# Patient Record
Sex: Female | Born: 1965 | Race: White | Hispanic: No | Marital: Married | Smoking: Current every day smoker
Health system: Southern US, Community
[De-identification: ages and names within clinical notes are randomized; demographics above are authoritative.]

## PROBLEM LIST (undated history)

## (undated) DIAGNOSIS — Z9221 Personal history of antineoplastic chemotherapy: Secondary | ICD-10-CM

## (undated) DIAGNOSIS — Z1501 Genetic susceptibility to malignant neoplasm of breast: Secondary | ICD-10-CM

## (undated) DIAGNOSIS — G709 Myoneural disorder, unspecified: Secondary | ICD-10-CM

## (undated) DIAGNOSIS — F329 Major depressive disorder, single episode, unspecified: Secondary | ICD-10-CM

## (undated) DIAGNOSIS — Z860100 Personal history of colon polyps, unspecified: Secondary | ICD-10-CM

## (undated) DIAGNOSIS — J189 Pneumonia, unspecified organism: Secondary | ICD-10-CM

## (undated) DIAGNOSIS — C801 Malignant (primary) neoplasm, unspecified: Secondary | ICD-10-CM

## (undated) DIAGNOSIS — Z923 Personal history of irradiation: Secondary | ICD-10-CM

## (undated) DIAGNOSIS — Z87442 Personal history of urinary calculi: Secondary | ICD-10-CM

## (undated) DIAGNOSIS — Z800 Family history of malignant neoplasm of digestive organs: Secondary | ICD-10-CM

## (undated) DIAGNOSIS — Z9049 Acquired absence of other specified parts of digestive tract: Secondary | ICD-10-CM

## (undated) DIAGNOSIS — F32A Depression, unspecified: Secondary | ICD-10-CM

## (undated) HISTORY — PX: COLONOSCOPY: SHX174

## (undated) HISTORY — DX: Acquired absence of other specified parts of digestive tract: Z90.49

## (undated) HISTORY — DX: Genetic susceptibility to malignant neoplasm of breast: Z15.01

## (undated) HISTORY — PX: POLYPECTOMY: SHX149

## (undated) HISTORY — PX: BREAST SURGERY: SHX581

## (undated) HISTORY — DX: Family history of malignant neoplasm of digestive organs: Z80.0

## (undated) HISTORY — DX: Depression, unspecified: F32.A

## (undated) HISTORY — DX: Personal history of colonic polyps: Z86.010

## (undated) HISTORY — PX: COLON SURGERY: SHX602

## (undated) HISTORY — DX: Myoneural disorder, unspecified: G70.9

## (undated) HISTORY — DX: Personal history of antineoplastic chemotherapy: Z92.21

## (undated) HISTORY — DX: Personal history of irradiation: Z92.3

## (undated) HISTORY — DX: Personal history of colon polyps, unspecified: Z86.0100

---
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
## 2015-02-04 DIAGNOSIS — Z9049 Acquired absence of other specified parts of digestive tract: Secondary | ICD-10-CM

## 2015-02-04 HISTORY — PX: COLON SURGERY: SHX602

## 2015-02-04 HISTORY — DX: Acquired absence of other specified parts of digestive tract: Z90.49

## 2018-04-26 ENCOUNTER — Telehealth: Payer: Self-pay | Admitting: Gastroenterology

## 2018-04-26 NOTE — Telephone Encounter (Signed)
April 09, 2018 DoD Dr. Havery Moros, pt had previous colonoscopy at [facility] in 2017.  Records will be placed on your desk for review.

## 2018-04-27 NOTE — Telephone Encounter (Signed)
I have accepted the patient for direct surveillance colonoscopy when we resume routine surveillance colonoscopy in the next few months. She has a history of multiple polyps removed in 2017 in [place].

## 2018-07-16 ENCOUNTER — Other Ambulatory Visit: Payer: Self-pay

## 2018-07-16 ENCOUNTER — Ambulatory Visit: Payer: BC Managed Care – PPO | Admitting: *Deleted

## 2018-07-16 VITALS — Ht 68.0 in | Wt 170.0 lb

## 2018-07-16 DIAGNOSIS — Z86010 Personal history of colonic polyps: Secondary | ICD-10-CM

## 2018-07-16 MED ORDER — NA SULFATE-K SULFATE-MG SULF 17.5-3.13-1.6 GM/177ML PO SOLN: 1.0000 | Freq: Once | ORAL | 0 refills | Status: AC

## 2018-07-16 NOTE — Progress Notes (Signed)

## 2018-07-21 ENCOUNTER — Encounter: Payer: Self-pay | Admitting: Gastroenterology

## 2018-07-29 ENCOUNTER — Telehealth: Payer: Self-pay | Admitting: Gastroenterology

## 2018-07-29 NOTE — Telephone Encounter (Signed)

## 2018-07-30 ENCOUNTER — Other Ambulatory Visit: Payer: Self-pay

## 2018-07-30 ENCOUNTER — Encounter: Payer: Self-pay | Admitting: Gastroenterology

## 2018-07-30 ENCOUNTER — Ambulatory Visit (AMBULATORY_SURGERY_CENTER): Payer: BC Managed Care – PPO | Admitting: Gastroenterology

## 2018-07-30 VITALS — BP 134/84 | HR 67 | Temp 98.8°F | Resp 15 | Ht 68.0 in | Wt 170.0 lb

## 2018-07-30 DIAGNOSIS — Z1211 Encounter for screening for malignant neoplasm of colon: Secondary | ICD-10-CM | POA: Diagnosis not present

## 2018-07-30 DIAGNOSIS — D123 Benign neoplasm of transverse colon: Secondary | ICD-10-CM

## 2018-07-30 DIAGNOSIS — D122 Benign neoplasm of ascending colon: Secondary | ICD-10-CM

## 2018-07-30 DIAGNOSIS — D125 Benign neoplasm of sigmoid colon: Secondary | ICD-10-CM

## 2018-07-30 DIAGNOSIS — Z860100 Personal history of colon polyps, unspecified: Secondary | ICD-10-CM

## 2018-07-30 MED ORDER — SODIUM CHLORIDE 0.9 % IV SOLN: 500.0000 mL | Freq: Once | INTRAVENOUS | Status: DC

## 2018-07-30 NOTE — Progress Notes (Signed)
Pt's states no medical or surgical changes since previsit or office visit. 

## 2018-07-30 NOTE — Op Note (Signed)
[facility] Patient Name: Abigail Berg Procedure Date: 07/30/2018 7:30 AM MRN: [id number] Endoscopist: Remo Lipps P. Havery Moros , MD Age: 53 Referring MD:  Date of Birth: 22-Jul-1965 Gender: Female Account #: [id number] Procedure:                Colonoscopy Indications:              High risk colon cancer surveillance: Personal                            history of colonic polyps - numerous polyps removed                            in the past including R sided surgical resection 3                            years ago, 3 second degree family members with                            colon cancer Medicines:                Monitored Anesthesia Care Procedure:                Pre-Anesthesia Assessment:                           - Prior to the procedure, a History and Physical                            was performed, and patient medications and                            allergies were reviewed. The patient's tolerance of                            previous anesthesia was also reviewed. The risks                            and benefits of the procedure and the sedation                            options and risks were discussed with the patient.                            All questions were answered, and informed consent                            was obtained. Prior Anticoagulants: The patient has                            taken no previous anticoagulant or antiplatelet                            agents. ASA Grade Assessment: II - A patient with  mild systemic disease. After reviewing the risks                            and benefits, the patient was deemed in                            satisfactory condition to undergo the procedure.                           After obtaining informed consent, the colonoscope                            was passed under direct vision. Throughout the                            procedure, the patient's blood pressure, pulse, and                             oxygen saturations were monitored continuously. The                            Colonoscope was introduced through the anus and                            advanced to the the ileocolonic anastomosis. The                            colonoscopy was performed without difficulty. The                            patient tolerated the procedure well. The quality                            of the bowel preparation was adequate. The rectum,                            surgical anastomosis were photographed. Scope In: 7:34:14 AM Scope Out: 8:03:52 AM Scope Withdrawal Time: 0 hours 21 minutes 6 seconds  Total Procedure Duration: 0 hours 29 minutes 38 seconds  Findings:                 The perianal and digital rectal examinations were                            normal.                           There was evidence of a prior end-to-side                            ileo-colonic anastomosis in the ascending colon.                            This was patent and was characterized by healthy  appearing mucosa.                           Two sessile polyps were found in the ascending                            colon. The polyps were 3 to 4 mm in size. These                            polyps were removed with a cold snare. Resection                            and retrieval were complete.                           Six sessile polyps were found in the transverse                            colon. The polyps were 3 to 5 mm in size. These                            polyps were removed with a cold snare. Resection                            and retrieval were complete.                           Two sessile polyps were found in the sigmoid colon.                            The polyps were 3 to 4 mm in size. These polyps                            were removed with a cold snare. Resection and                            retrieval were complete.                            Scattered medium-mouthed diverticula were found in                            the entire colon.                           The colon was extremely tortuous.                           Internal hemorrhoids were found during retroflexion.                           There were numerous benign appearing hyperplastic  polyps in the rectosigmoid and sigmoid colon. The                            exam was otherwise without abnormality. Complications:            No immediate complications. Estimated blood loss:                            Minimal. Estimated Blood Loss:     Estimated blood loss was minimal. Impression:               - Patent end-to-side ileo-colonic anastomosis,                            characterized by healthy appearing mucosa.                           - Two 3 to 4 mm polyps in the ascending colon,                            removed with a cold snare. Resected and retrieved.                           - Six 3 to 5 mm polyps in the transverse colon,                            removed with a cold snare. Resected and retrieved.                           - Two 3 to 4 mm polyps in the sigmoid colon,                            removed with a cold snare. Resected and retrieved.                           - Diverticulosis in the entire examined colon.                           - Left sided hyperplastic polyps                           - Tortuous colon.                           - Internal hemorrhoids.                           - The examination was otherwise normal. Recommendation:           - Patient has a contact number available for                            emergencies. The signs and symptoms of potential  delayed complications were discussed with the                            patient. Return to normal activities tomorrow.                            Written discharge instructions were provided to the                             patient.                           - Resume previous diet.                           - Continue present medications.                           - Await pathology results.                           - Consideration for genetic testing, will discuss                            with patient given burden of polyps and family                            history of colon cancer Steven P. Armbruster, MD 07/30/2018 8:12:00 AM This report has been signed electronically.

## 2018-07-30 NOTE — Progress Notes (Signed)
A and O x3. Report to RN. Tolerated MAC anesthesia well.

## 2018-07-30 NOTE — Patient Instructions (Signed)
Information on polyps and diverticulosis given to you today.  Await pathology results.  Consider genetic testing.  YOU HAD AN ENDOSCOPIC PROCEDURE TODAY AT [facility]:   Refer to the procedure report that was given to you for any specific questions about what was found during the examination.  If the procedure report does not answer your questions, please call your gastroenterologist to clarify.  If you requested that your care partner not be given the details of your procedure findings, then the procedure report has been included in a sealed envelope for you to review at your convenience later.  YOU SHOULD EXPECT: Some feelings of bloating in the abdomen. Passage of more gas than usual.  Walking can help get rid of the air that was put into your GI tract during the procedure and reduce the bloating. If you had a lower endoscopy (such as a colonoscopy or flexible sigmoidoscopy) you may notice spotting of blood in your stool or on the toilet paper. If you underwent a bowel prep for your procedure, you may not have a normal bowel movement for a few days.  Please Note:  You might notice some irritation and congestion in your nose or some drainage.  This is from the oxygen used during your procedure.  There is no need for concern and it should clear up in a day or so.  SYMPTOMS TO REPORT IMMEDIATELY:   Following lower endoscopy (colonoscopy or flexible sigmoidoscopy):  Excessive amounts of blood in the stool  Significant tenderness or worsening of abdominal pains  Swelling of the abdomen that is new, acute  Fever of 100F or higher   For urgent or emergent issues, a gastroenterologist can be reached at any hour by calling [phone].   DIET:  We do recommend a small meal at first, but then you may proceed to your regular diet.  Drink plenty of fluids but you should avoid alcoholic beverages for 24 hours.  ACTIVITY:  You should plan to take it easy for the rest of today  and you should NOT DRIVE or use heavy machinery until tomorrow (because of the sedation medicines used during the test).    FOLLOW UP: Our staff will call the number listed on your records 48-72 hours following your procedure to check on you and address any questions or concerns that you may have regarding the information given to you following your procedure. If we do not reach you, we will leave a message.  We will attempt to reach you two times.  During this call, we will ask if you have developed any symptoms of COVID 19. If you develop any symptoms (ie: fever, flu-like symptoms, shortness of breath, cough etc.) before then, please call [phone].  If you test positive for Covid 19 in the 2 weeks post procedure, please call and report this information to [place].    If any biopsies were taken you will be contacted by phone or by letter within the next 1-3 weeks.  Please call us at [phone] if you have not heard about the biopsies in 3 weeks.    SIGNATURES/CONFIDENTIALITY: You and/or your care partner have signed paperwork which will be entered into your electronic medical record.  These signatures attest to the fact that that the information above on your After Visit Summary has been reviewed and is understood.  Full responsibility of the confidentiality of this discharge information lies with you and/or your care-partner.

## 2018-08-03 ENCOUNTER — Telehealth: Payer: Self-pay

## 2018-08-03 NOTE — Telephone Encounter (Signed)
  Follow up Call-  Call back number 07/30/2018 07/30/2018  Post procedure Call Back phone  # [phone] -  Permission to leave phone message Yes Yes     Patient questions:  Do you have a fever, pain , or abdominal swelling? No. Pain Score  0 *  Have you tolerated food without any problems? Yes.    Have you been able to return to your normal activities? Yes.    Do you have any questions about your discharge instructions: Diet   No. Medications  No. Follow up visit  No.  Do you have questions or concerns about your Care? No.  Actions: * If pain score is 4 or above: No action needed, pain <4.  1. Have you developed a fever since your procedure? no  2.   Have you had an respiratory symptoms (SOB or cough) since your procedure? no  3.   Have you tested positive for COVID 19 since your procedure no  4.   Have you had any family members/close contacts diagnosed with the COVID 19 since your procedure?  no   If yes to any of these questions please route to Joylene John, RN and Alphonsa Gin, Therapist, sports.

## 2018-08-05 ENCOUNTER — Telehealth: Payer: Self-pay | Admitting: Licensed Clinical Social Worker

## 2018-08-05 ENCOUNTER — Other Ambulatory Visit: Payer: Self-pay

## 2018-08-05 ENCOUNTER — Encounter: Payer: Self-pay | Admitting: Licensed Clinical Social Worker

## 2018-08-05 DIAGNOSIS — Z86010 Personal history of colonic polyps: Secondary | ICD-10-CM

## 2018-08-05 NOTE — Telephone Encounter (Signed)
Pt has been scheduled for genetic counseling on 7/23 at 11am. Letter mailed.

## 2018-08-25 ENCOUNTER — Telehealth: Payer: Self-pay | Admitting: Licensed Clinical Social Worker

## 2018-08-25 NOTE — Telephone Encounter (Signed)
Called patient regarding upcoming Webex appointment, screen test complete and e-mail has been sent.

## 2018-08-26 ENCOUNTER — Encounter: Payer: Self-pay | Admitting: Licensed Clinical Social Worker

## 2018-08-26 ENCOUNTER — Inpatient Hospital Stay: Payer: BC Managed Care – PPO | Attending: Genetic Counselor | Admitting: Licensed Clinical Social Worker

## 2018-08-26 DIAGNOSIS — Z800 Family history of malignant neoplasm of digestive organs: Secondary | ICD-10-CM | POA: Insufficient documentation

## 2018-08-26 DIAGNOSIS — Z860100 Personal history of colon polyps, unspecified: Secondary | ICD-10-CM | POA: Insufficient documentation

## 2018-08-26 NOTE — Progress Notes (Signed)
REFERRING PROVIDER: Yetta Flock, MD [address] [place],  [place] [address]  PRIMARY PROVIDER:  Patient, [name]  PRIMARY REASON FOR VISIT:  1. Family history of colon cancer   2. Personal history of colonic polyps     I connected with Abigail Berg on 08/26/2018 at 11:00 AM EDT by Webex and verified that I am speaking with the correct person using two identifiers.    Patient location: home Provider location: clinic  HISTORY OF PRESENT ILLNESS:   Abigail Berg, a 53 y.o. female, was seen for a [facility] cancer genetics consultation at the request of Dr. [name] due to a personal history of colon polyps and family history of colon cancer.  Abigail Berg presents to clinic today to discuss the possibility of a hereditary predisposition to cancer, genetic testing, and to further clarify her future cancer risks, as well as potential cancer risks for family members.    Abigail Berg is a 53 y.o. female with no personal history of cancer.  She has had 2 colonoscopies. At age 46, she reports having a colonoscopy that revealed 11 polyps and most were pre-cancerous. At age 53, she had a colonoscopy that revealed 10 tubular adenomas, for an approximate cumulative 20 tubular adenomas.   CANCER HISTORY:  Oncology History   No history exists.     RISK FACTORS:  Menarche was at age 15.  First live birth at age 52.  Ovaries intact: yes.  Hysterectomy: no.  Menopausal status: premenopausal.   Colonoscopy: yes; polyp history described above. Mammogram within the last year: yes.  Past Medical History:  Diagnosis Date  . Depression   . Family history of colon cancer   . Personal history of colonic polyps     Past Surgical History:  Procedure Laterality Date  . BREAST SURGERY    . COLON SURGERY     2017  . COLONOSCOPY    . POLYPECTOMY      Social History   Socioeconomic History  . Marital status: Married    Spouse name: Not on file  . Number of children: Not on file  . Years  of education: Not on file  . Highest education level: Not on file  Occupational History  . Not on file  Social Needs  . Financial resource strain: Not on file  . Food insecurity    Worry: Not on file    Inability: Not on file  . Transportation needs    Medical: Not on file    Non-medical: Not on file  Tobacco Use  . Smoking status: Current Every Day Smoker  . Smokeless tobacco: Never Used  Substance and Sexual Activity  . Alcohol use: Never    Frequency: Never  . Drug use: Never  . Sexual activity: Not on file  Lifestyle  . Physical activity    Days per week: Not on file    Minutes per session: Not on file  . Stress: Not on file  Relationships  . Social Herbalist on phone: Not on file    Gets together: Not on file    Attends religious service: Not on file    Active member of club or organization: Not on file    Attends meetings of clubs or organizations: Not on file    Relationship status: Not on file  Other Topics Concern  . Not on file  Social History Narrative  . Not on file     FAMILY HISTORY:  We obtained a detailed, 4-generation family history.  Significant diagnoses are listed below: Family History  Problem Relation Age of Onset  . Colon cancer Maternal Grandmother 68  . Colon cancer Cousin 42  . Colon cancer Maternal Grandfather 12  . Colon polyps Neg Hx   . Rectal cancer Neg Hx   . Stomach cancer Neg Hx   . Esophageal cancer Neg Hx    Abigail Berg has one son, age 66, no cancer history and has not had colonoscopy. Abigail Berg has one sister, 24, who she believes has had a few polyps.  Abigail Berg mother is living at 54. She believes she has had polyps in the past but is unsure of how many, no cancer diagnoses. The patient had 1 maternal uncle who passed away at 58. This uncle had a daughter who was diagnosed with colon cancer and died at 28. Both Abigail Berg maternal grandparents had colon cancer. Her grandfather was diagnosed in his 27s, grandmother in  her 67s.   Abigail Berg father died at 39, no cancer history. Patient has 2 paternal aunts, no cancers. No known cancers in cousins. Her paternal grandmother died at 45, grandfather died at 81, no known cancers.  Abigail Berg is unaware of previous family history of genetic testing for hereditary cancer risks.  There is no reported Ashkenazi Jewish ancestry. There is no known consanguinity.  GENETIC COUNSELING ASSESSMENT: Abigail Berg is a 53 y.o. female with a personal and family history which is somewhat suggestive of a hereditary cancer syndrome/hereditary polyposis syndrome and predisposition to cancer. We, therefore, discussed and recommended the following at today's visit.   DISCUSSION: We discussed that polyps in general are common, however, most people have fewer than 5 lifetime polyps.  When an individual has 10 or more polyps we become concerned about an underlying polyposis syndrome.  The most common hereditary polyposis syndromes are caused by problems in the APC and MUTYH genes, however, more recently, mutations in the [place] and MSH3 genes have been identified in some polyposis families.  We also discussed Lynch syndrome given the family history of colon cancer.  We reviewed the characteristics, features and inheritance patterns of hereditary cancer syndromes. We also discussed genetic testing, including the appropriate family members to test, the process of testing, insurance coverage and turn-around-time for results. We discussed the implications of a negative, positive and/or variant of uncertain significant result. We recommended Abigail Berg pursue genetic testing for the Common Hereditary Cancers gene panel.   The Common Hereditary Cancers Panel offered by [facility] includes sequencing and/or deletion duplication testing of the following 48 genes: APC, ATM, AXIN2, BARD1, BMPR1A, BRCA1, BRCA2, BRIP1, CDH1, CDKN2A (p14ARF), CDKN2A (p16INK4a), CKD4, CHEK2, CTNNA1, DICER1, EPCAM (Deletion/duplication  testing only), GREM1 (promoter region deletion/duplication testing only), KIT, MEN1, MLH1, MSH2, MSH3, MSH6, MUTYH, NBN, NF1, NHTL1, PALB2, PDGFRA, PMS2, POLD1, POLE, PTEN, RAD50, RAD51C, RAD51D, RNF43, SDHB, SDHC, SDHD, SMAD4, SMARCA4. STK11, TP53, TSC1, TSC2, and VHL.  The following genes were evaluated for sequence changes only: SDHA and HOXB13 c.251G>A variant only.  Based on Abigail Berg's personal and family history of cancer, she meets medical criteria for genetic testing. Despite that she meets criteria, she may still have an out of pocket cost.   PLAN: After considering the risks, benefits, and limitations, Abigail Berg provided informed consent to pursue genetic testing. A saliva kit was mailed to her home and she will send her sample to [facility] for analysis of the Common Hereditary Cancers Panel. Results should  be available within approximately 2-3 weeks' time, at which point they will be disclosed by telephone to Abigail Berg, as will any additional recommendations warranted by these results. Abigail Berg will receive a summary of her genetic counseling visit and a copy of her results once available. This information will also be available in Epic.   Based on Abigail Berg's family history, we recommended her maternal relatives have genetic counseling and testing. Abigail Berg will let us know if we can be of any assistance in coordinating genetic counseling and/or testing for this family member.   Lastly, we encouraged Ms. Brasher to remain in contact with cancer genetics annually so that we can continuously update the family history and inform her of any changes in cancer genetics and testing that may be of benefit for this family.   Ms. Antrobus questions were answered to her satisfaction today. Our contact information was provided should additional questions or concerns arise. Thank you for the referral and allowing [place] to share in the care of your patient.   Faith Rogue, MS, [facility]sel[facility] [email or web address] Phone: [phone]  The patient was seen for a total of 40 minutes in virtual genetic counseling.  Drs. Magrinat, Lindi Adie and/or Burr Medico were available for discussion regarding this case.   _______________________________________________________________________ For Office Staff:  Number of people involved in session: 1 Was an Intern/ student involved with case: no

## 2018-08-30 DIAGNOSIS — Z86010 Personal history of colonic polyps: Secondary | ICD-10-CM | POA: Diagnosis not present

## 2018-08-30 DIAGNOSIS — Z800 Family history of malignant neoplasm of digestive organs: Secondary | ICD-10-CM | POA: Diagnosis not present

## 2018-09-26 ENCOUNTER — Other Ambulatory Visit: Payer: Self-pay | Admitting: Oncology

## 2018-09-27 ENCOUNTER — Encounter: Payer: Self-pay | Admitting: Licensed Clinical Social Worker

## 2018-09-27 ENCOUNTER — Telehealth: Payer: Self-pay | Admitting: Licensed Clinical Social Worker

## 2018-09-27 ENCOUNTER — Ambulatory Visit: Payer: Self-pay | Admitting: Licensed Clinical Social Worker

## 2018-09-27 DIAGNOSIS — Z1501 Genetic susceptibility to malignant neoplasm of breast: Secondary | ICD-10-CM | POA: Insufficient documentation

## 2018-09-27 DIAGNOSIS — Z86010 Personal history of colonic polyps: Secondary | ICD-10-CM

## 2018-09-27 DIAGNOSIS — Z1379 Encounter for other screening for genetic and chromosomal anomalies: Secondary | ICD-10-CM | POA: Insufficient documentation

## 2018-09-27 DIAGNOSIS — Z800 Family history of malignant neoplasm of digestive organs: Secondary | ICD-10-CM

## 2018-09-27 NOTE — Telephone Encounter (Signed)
Received a msg to schedule Abigail Berg for the high risk breast clinic. I cld and lft a vm for the pt to be scheduled.

## 2018-09-27 NOTE — Progress Notes (Signed)
Genetic Test Results  HPI:  Abigail Berg was previously seen in the [place] clinic due to a family history of cancer and personal history of colon polyps and concerns regarding a hereditary predisposition to cancer. Please refer to our prior cancer genetics clinic note for more information regarding our discussion, assessment and recommendations, at the time. Abigail Berg recent genetic test results were disclosed to her, as were recommendations warranted by these results. These results and recommendations are discussed in more detail below.  CANCER HISTORY:  Oncology History   No history exists.    FAMILY HISTORY:  We obtained a detailed, 4-generation family history.  Significant diagnoses are listed below: Family History  Problem Relation Age of Onset   Colon cancer Maternal Grandmother 60   Colon cancer Cousin 38   Colon cancer Maternal Grandfather 97   Colon polyps Neg Hx    Rectal cancer Neg Hx    Stomach cancer Neg Hx    Esophageal cancer Neg Hx     Abigail Berg has one son, age 22, no cancer history and has not had colonoscopy. Abigail Berg has one sister, 11, who she believes has had a few polyps.  Abigail Berg mother is living at 94. She believes she has had polyps in the past but is unsure of how many, no cancer diagnoses. The patient had 1 maternal uncle who passed away at 69. This uncle had a daughter who was diagnosed with colon cancer and died at 32. Both Abigail Berg maternal grandparents had colon cancer. Her grandfather was diagnosed in his 75s, grandmother in her 65s.   Abigail Berg father died at 93, no cancer history. Patient has 2 paternal aunts, no cancers. No known cancers in cousins. Her paternal grandmother died at 38, grandfather died at 18, no known cancers.  Abigail Berg is unaware of previous family history of genetic testing for hereditary cancer risks.  There is no reported Ashkenazi Jewish ancestry. There is no known consanguinity.  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    The Common Hereditary Cancers Panel offered by [facility] includes sequencing and/or deletion duplication testing of the following 48 genes: APC, ATM, AXIN2, BARD1, BMPR1A, BRCA1, BRCA2, BRIP1, CDH1, CDKN2A (p14ARF), CDKN2A (p16INK4a), CKD4, CHEK2, CTNNA1, DICER1, EPCAM (Deletion/duplication testing only), GREM1 (promoter region deletion/duplication testing only), KIT, MEN1, MLH1, MSH2, MSH3, MSH6, MUTYH, NBN, NF1, NHTL1, PALB2, PDGFRA, PMS2, POLD1, POLE, PTEN, RAD50, RAD51C, RAD51D, RNF43, SDHB, SDHC, SDHD, SMAD4, SMARCA4. STK11, TP53, TSC1, TSC2, and VHL.  The following genes were evaluated for sequence changes only: SDHA and HOXB13 c.251G>A variant only..   The test report has been scanned into EPIC and is located under the Molecular Pathology section of the Results Review tab.  A portion of the result report is included below for reference.     Genetic testing also did identify 2 Variants of uncertain significance (VUS) - one in the [facility] gene called c.719A>G, a second in the MSH2 gene called c.1601G.A.  At this time, it is unknown if these variants are associated with increased cancer risk or if they are normal findings, but most variants such as these get reclassified to being inconsequential. They should not be used to make medical management decisions. With time, we suspect the lab will determine the significance of these variants, if any. If we do learn  more about them, we will try to contact Abigail Berg to discuss it further. However, it is important to stay in touch with [place] periodically and keep the address and phone number up to date.   CHEK2  Clinical condition  The CHEK2 gene is associated with an increased risk for autosomal dominant adult-onset cancers, including breast, colon,  thyroid, prostate, and possibly others. The risks of these cancers, particularly breast, have been determined to be both variant- and family history-dependent. We discussed that this result likely explains the colon cancer in the family.   Lifetime risks for female breast cancer related to frameshift variants, such as 1100delC, have been estimated to be 25-39% in heterozygotes.The risks for most missense variants are unclear, but risks for certain variants (such as p.Ile157Thr) are thought to be lower.  Inheritance  Hereditary predisposition to cancer due to pathogenic variants in the CHEK2 gene has autosomal dominant inheritance. This means that an individual with a pathogenic variant has a 50% chance of passing the condition onto their offspring. This result allows for the identification of at-risk relatives who can pursue testing for this specific familial variant. Many cases are inherited from a parent, but some cases can occur spontaneously (i.e., an individual with a pathogenic variant has parents who do not have it)  Management Current screening guidelines from the [employer]  (NCCN) for those with a pathogenic CHEK2 variant are as follows:  Breast cancer: -Annual mammogram with consideration of tomosynthesis; also consider breast MRI with contrast beginning at age 9 -Evidence of risk-reducing mastectomy is insufficient; manage based on family history  Abigail Berg has been referred to our high risk clinic for discussion of her breast management.  Colon cancer: -Colonoscopy screening every 5 years beginning at age 47 -If an individual has a first-degree relative with colorectal cancer, screening should begin 10 years prior to the relatives age at diagnosis if before 34. -If an individual has a personal history of colorectal cancer, screening recommendations should be based on recommendations for post-colorectal cancer resection.  It has been suggested that  men with a CHEK2 pathogenic variant and a first-degree relative with prostate cancer have an annual prostate-specific antigen (PSA) analysis (PMID: 26834196). However, the benefits of screening for prostate cancer among men with a pathogenic variant in CHEK2 are uncertain (PMID: 22297989).  An individuals cancer risk and medical management are not determined by genetic test results alone. Overall cancer risk assessment incorporates additional factors, including personal medical history, family history, and any available genetic information that may result in a personalized plan for cancer prevention and surveillance.  Knowing if a pathogenic CHEK2 variant is present is advantageous. At-risk relatives can be identified, enabling pursuit of a diagnostic evaluation. Information regarding hereditary cancer susceptibility genes is constantly evolving, and more clinically relevant data regarding CHEK2 is likely to become available in the near future. Awareness of this cancer predisposition encourages patients and their providers to inform at-risk family members, to diligently follow condition-specific screening protocols, and to be vigilant in maintaining close and regular contact with their local genetics clinic in anticipation of new information.  HOXB13  HOXB13 is associated with an increased risk for prostate cancer. We discussed that this result currently does not change any of her own management, but would be important for her son to know about.   Clinical condition  Numerous studies have shown that the c.251G>A (p.Gly84Glu) variant in HOXB13, also known as G84E, is associated with an increased risk of prostate cancer (PMID: 21194174,  81829937, 16967893, 81017510, 25852778, 24235361, 44315400, 86761950, 93267124, 58099833, 82505397). This variant is associated with earlier-onset diagnosis (<55 years) and individuals with this variant are more likely to have a positive family history of prostate cancer  (PMID: 67341937, 90240973, 53299242, 68341962, 22979892, 11941740).  Large case control studies and meta-analysis across studies demonstrated that men with this variant have approximately a 33-60% lifetime risk of prostate cancer compared to the general population risk of 12% (PMID: 81448185, 63149702, 63785885, 02774128, 78676720). Additionally, this variant is present in 0.2% of the general population [employer] ([employer]). Accessed September 2016). The G84E variant is observed at a higher frequency in individuals of European ancestry (0.8%), suggesting it may be a European founder mutation (PMID: 94709628). An individual with this variant will not necessarily develop cancer in his lifetime, but the risk is increased over the general population risk. There may also be an increased risk for other cancer types, however, the current evidence is limited and conflicting (PMID: 36629476, 54650354, 65681275, 17001749, 44967591).  Inheritance  Hereditary predisposition to cancer due to the HOXB13 G84E variant has autosomal dominant inheritance. This means that an individual with this variant has a 50% chance of passing it to their offspring. Once such a variant is detected, it is possible to identify at-risk relatives who can pursue testing. Many cases are inherited from a parent, but some cases can occur spontaneously (i.e., an individual with a pathogenic variant has parents who do not have it).  Management  The [place] (NCCN) currently does not recommend additional prostate cancer screening for individuals with the HOXB13 G84E variant beyond what is recommended for the general population. However, NCCN cautions that cancer screening should ultimately be guided by personal and family history (The [employer]  (NCCN). Prostate Cancer Early Detection. Version 1. 2016). In the absence of formal guidelines, a formal urology consultation may be  considered.  FAMILY MEMBERS: It is important that all of Abigail Berg relatives (both men and women) know of the presence of these gene mutations.   Genetic testing can sort out who in your family is at risk and who is not.    Abigail Berg son and sister are at 50% risk to have inherited these mutations. We recommend they have genetic testing for these same mutations, as identifying the presence of this mutation would allow them to also take advantage of risk-reducing measures.  PLAN:  1. These results will be made available to Abigail Berg's referring provider, Dr. Havery Moros. She would like to be referred to the high risk clinic to discuss her breast care. This referral has been placed and she knows to expect a call to schedule. 2. Abigail Berg will share this information with her family members. She gives [place] permission to discuss her results with her son, Loyalty Arentz and her sister, Charlane Ferretti.   SUPPORT AND RESOURCES:  We provided information about two support groups for hereditary cancer syndrome information and support, Facing Our Risk (www.facingourrisk.com) and Bright Pink (www.brightpink.org) which some people have found useful.  They provide opportunities to speak with other individuals from high-risk families.    Lastly, we encouraged Ms. Fedorko to remain in contact with cancer genetics on an annual basis so we can update her personal and family histories and let her know of any advances. Our contact number was provided. Ms. Rosman questions were answered to her satisfaction, and she knows she is welcome to call us at anytime with additional questions or concerns.  Faith Rogue, MS Genetic Counselor [email or web address] Phone: [phone]

## 2018-09-27 NOTE — Telephone Encounter (Signed)
Revealed CHEK2 pathogenic variant identified. Also revealed HOXB13 increased risk allele identified, as well as a VUS in MSH2 and  VUS in CDH1. We discussed these results in detail. We dicussed the CHEK2 gene, cancer risks, management, and next steps. Her son and sister will call me to schedule appointments for genetic testing.

## 2018-09-28 ENCOUNTER — Telehealth: Payer: Self-pay | Admitting: Nurse Practitioner

## 2018-09-28 NOTE — Telephone Encounter (Signed)
Abigail Berg has been scheduled for the pt to see Cira Rue on 9/17 at 145pm. Aware to arrive 15 minutes early

## 2018-10-21 ENCOUNTER — Inpatient Hospital Stay: Payer: BC Managed Care – PPO | Attending: Genetic Counselor | Admitting: Nurse Practitioner

## 2018-10-21 ENCOUNTER — Encounter: Payer: Self-pay | Admitting: Nurse Practitioner

## 2018-10-21 ENCOUNTER — Other Ambulatory Visit: Payer: Self-pay

## 2018-10-21 ENCOUNTER — Telehealth: Payer: Self-pay | Admitting: Nurse Practitioner

## 2018-10-21 VITALS — BP 137/69 | HR 84 | Temp 98.4°F | Resp 16 | Ht 68.0 in | Wt 179.9 lb

## 2018-10-21 DIAGNOSIS — Z1509 Genetic susceptibility to other malignant neoplasm: Secondary | ICD-10-CM | POA: Diagnosis not present

## 2018-10-21 DIAGNOSIS — Z1589 Genetic susceptibility to other disease: Secondary | ICD-10-CM | POA: Diagnosis not present

## 2018-10-21 DIAGNOSIS — Z803 Family history of malignant neoplasm of breast: Secondary | ICD-10-CM

## 2018-10-21 DIAGNOSIS — Z1501 Genetic susceptibility to malignant neoplasm of breast: Secondary | ICD-10-CM | POA: Insufficient documentation

## 2018-10-21 MED ORDER — TAMOXIFEN CITRATE 20 MG PO TABS: 20.0000 mg | ORAL_TABLET | Freq: Every day | ORAL | 3 refills | Status: DC

## 2018-10-21 NOTE — Progress Notes (Signed)
[place]  [phone] Fax:([phone]) [place] Note   Patient Care Team: Patient, [name] as PCP - General (General Practice) Armbruster, Carlota Raspberry, MD as Consulting Physician (Gastroenterology) Eyvonne Mechanic as Counselor (Licensed Clinical Social Worker) Truitt Merle, MD as Consulting Physician (Hematology) Alla Feeling, NP as Nurse Practitioner (Nurse Practitioner) 10/21/2018  CHIEF COMPLAINTS/PURPOSE OF CONSULTATION:  High risk breast clinic, referred by Genetic Counselor Faith Rogue  HISTORY OF PRESENTING ILLNESS:  Abigail Berg 53 y.o. female is here because of a personal increased risk for breast cancer.  She has been compliant with annual mammogram. She had an abnormal screening in 11/2015 and underwent excisional biopsy. 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 She has a history of multiple polyps removed in [place], s/p right sided surgical resection in 07/2015.  Repeat colonoscopy on 07/30/2018 per Dr. Havery Moros revealed 10 polyps showing tubular adenomas and hyperplastic polyps. She was referred to genetic counselor, which ultimately revealed 1 pathogenic variant in [place], 1 increased risk allele identified in HOXB13, and VUS in [facility] and MSH2.   GYN HISTORY  Menarchal: 12 LMP: 10/19/2018, irregular HRT: None G1P1  She has no significant past medical history. Denies CAD, DM, HTN, or other medical conditions. She is up to date on cervical cancer screening. Socially, she lives with her husband. She is busy taking care of family but does not exercise much. She has strong family history of colon cancer. Maternal grandmother's sister had breast cancer in her 41's. She denies alcohol. She currently smokes cigarettes, 1/2 PPD x30 years. She has a sister and son who both just underwent genetic testing.     Today, she presents to clinic by herself. Her friend is on the phone for today's  visit. Has normal appetite and weight. Denies change in bowel habits. Denies rectal or other bleeding. No concerns in her breasts. Denies recent fever, chills, cough, chest pain, dyspnea, leg swelling, or pain.    MEDICAL HISTORY:  Past Medical History:  Diagnosis Date  . Depression   . Family history of colon cancer   . Monoallelic mutation of CHEK2 gene in female patient   . Personal history of colonic polyps     SURGICAL HISTORY: Past Surgical History:  Procedure Laterality Date  . BREAST SURGERY    . COLON SURGERY     2017  . COLONOSCOPY    . POLYPECTOMY      SOCIAL HISTORY: Social History   Socioeconomic History  . Marital status: Married    Spouse name: Not on file  . Number of children: Not on file  . Years of education: Not on file  . Highest education level: Not on file  Occupational History  . Not on file  Social Needs  . Financial resource strain: Not on file  . Food insecurity    Worry: Not on file    Inability: Not on file  . Transportation needs    Medical: Not on file    Non-medical: Not on file  Tobacco Use  . Smoking status: Current Every Day Smoker  . Smokeless tobacco: Never Used  Substance and Sexual Activity  . Alcohol use: Never    Frequency: Never  . Drug use: Never  . Sexual activity: Not on file  Lifestyle  . Physical activity    Days per week: Not on file    Minutes per session: Not on file  . Stress: Not on file  Relationships  . Social Herbalist on phone: Not on file    Gets together: Not on file    Attends religious service: Not on file    Active member of club or organization: Not on file    Attends meetings of clubs or organizations: Not on file    Relationship status: Not on file  . Intimate partner violence    Fear of current or ex partner: Not on file    Emotionally abused: Not on file    Physically abused: Not on file    Forced sexual activity: Not on file  Other Topics Concern  . Not on file  Social  History Narrative  . Not on file    FAMILY HISTORY: Family History  Problem Relation Age of Onset  . Colon cancer Maternal Grandmother 68  . Colon cancer Cousin 64  . Colon cancer Maternal Grandfather 64  . Colon polyps Neg Hx   . Rectal cancer Neg Hx   . Stomach cancer Neg Hx   . Esophageal cancer Neg Hx     ALLERGIES:  has No Known Allergies.  MEDICATIONS:  Current Outpatient Medications  Medication Sig Dispense Refill  . escitalopram (LEXAPRO) 20 MG tablet Take 20 mg by mouth daily.    . tamoxifen (NOLVADEX) 20 MG tablet Take 1 tablet (20 mg total) by mouth daily. 30 tablet 3   No current facility-administered medications for this visit.     PHYSICAL EXAMINATION: ECOG PERFORMANCE STATUS: 0 - Asymptomatic  Vitals:   10/21/18 1341  BP: 137/69  Pulse: 84  Resp: 16  Temp: 98.4 F (36.9 C)  SpO2: 100%   Filed Weights   10/21/18 1341  Weight: 179 lb 14.4 oz (81.6 kg)    GENERAL:alert, no distress and comfortable SKIN: no rash  EYES: sclera clear NECK: without mass LYMPH:  no palpable cervical, supraclavicular lymphadenopathy LUNGS: clear with normal breathing effort HEART: regular rate & rhythm, no lower extremity edema ABDOMEN: abdomen flat  Musculoskeletal:no cyanosis of digits  PSYCH: alert & oriented x 3 with fluent speech NEURO: no focal motor/sensory deficits Breast exam: symmetrical breasts without rash, erythema, nipple discharge or inversion. No palpable mass in either breast or axilla that I could appreciate  LABORATORY DATA:  I have reviewed the data as listed No flowsheet data found.   RADIOGRAPHIC STUDIES: I have personally reviewed the radiological images as listed and agreed with the findings in the report. No results found.  ASSESSMENT & PLAN: 53 yo with no significant PMH  1. 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  Her estimated lifetime risk of breast cancer is probably around 25-40% -According to NCCN guidelines, annual screening breast MRI in addition to mammogram is recommended for early breast cancer detection. I reviewed MRI is more sensitive and can detect smaller masses, especially in women with dense breast tissue which she reports she has  -I also recommend self breast exam, and physician breast exam twice a year. We reviewed warning symptoms including nipple discharge or inversion, skin changes, or palpable lumps.  -We also discussed the other risk for breast cancer, such as obesity, postmenopausal hormonal replacement,  etc. I educated her about importance of maintaining a healthy diet, exercising, and strongly encouraged her to quit smoking cessation  -I discussed chemoprevention with Tamoxifen which is beneficial in some cases, given CHEK2 increases risk of hormone positive breast cancer. However, since she does not have very strong family history of breast cancer, close surveillance is also appropriate.   -The potential benefit and side effects including hot flash, metabolic changes ( increased blood glucose, cholesterol, weight, etc.), slightly increased risk of cardiovascular disease, thrombosis, endometrial cancer, cataracts, and muscular and joint discomfort were discussed with her in great detail. She is interested and agrees to start. If she tolerates well, plan for 5 years. I again strongly encouraged her to quit smoking as this can elevate her risk of thrombosis. -We also discussed the increased risk of colon cancer from CHEK2 mutation; she will f/u with Dr. Havery Moros for screening colonoscopies, last done 07/2018  -She prefers for her  primary provider in [place], Dr. Nori Riis, to order breast MRI; she does agree to return to [facility] in 3 months to monitor her tolerance to tamoxifen.  -I discussed her case with Dr. Burr Medico. -I will CC my note to Dr. Havery Moros and fax it to Dr. Nori Riis   PLAN: -Genetic report and medical record reviewed -Proceed with annual screening mammogram and bilat breast MRI w/wo contrast for high risk patient (OK for primary provider to order at imaging center close to her home) -Chemoprevention with tamoxifen, prescribed today  -Return to [facility] lab, f/u with Dr. Burr Medico in 3 months to monitor response to tamoxifen  -continue f/u with Dr. Havery Moros for surveillance colonoscopy -continue f/u with PCP, OB/Gyn for routine health maintenance and other age appropriate cancer screenings  -quit smoking -increase physical exercise   All questions were answered. The patient knows to call the clinic with any problems, questions or concerns. I spent 45 minutes counseling the patient face to face. The total time spent in the appointment was 60 minutes and more than 50% was on counseling.     Alla Feeling, NP 10/21/2018 5:10 PM

## 2018-10-21 NOTE — Telephone Encounter (Signed)
Scheduled appt per 9/17 los.  Patient aware of appt date and time.

## 2018-10-25 NOTE — Progress Notes (Signed)
Patient's most recent office note from [place] faxed to Dr. Haskel Khan office at [phone]

## 2018-11-01 ENCOUNTER — Telehealth: Payer: Self-pay

## 2018-11-01 NOTE — Telephone Encounter (Signed)
Spoke with Dr. Haskel Khan nurse with [facility] to see if Dr. Oval Linsey was comfortable with ordering a breast MRI with mammogram for the patient since she is high risk for breast cancer. Lisa-RN informed me that patient was scheduled for a follow up with Dr. Oval Linsey next month, as well as a mammogram, and that he would discuss ordering the breast MRI with the patient at that visit.   Sent in-basket message Lacie Burton-NP with above information. Will continue to support as needed.

## 2018-11-01 NOTE — Telephone Encounter (Signed)
-----   Message from Alla Feeling, NP sent at 10/26/2018 11:39 AM EDT ----- Yes, if you could circle back in a week or so, and confirm the provider is comfortable to order screening breast MRI with mammogram for high risk for breast cancer.  I'd appreciate it! Thanks, Lacie  ----- Message ----- From: Zola Button, RN Sent: 10/26/2018  11:22 AM EDT To: Alla Feeling, NP  Yes--I sent it yesterday and got a confirmation fax that it went through. Let me know if I need to call to make sure they received it.   Thanks,  Floriene Jeschke ----- Message ----- From: Alla Feeling, NP Sent: 10/26/2018  11:19 AM EDT To: Zola Button, RN  Hi Trenice Mesa just wanted to circle back on this to be sure it was done. Thanks, LB ----- Message ----- From: Zola Button, RN Sent: 10/22/2018   8:08 AM EDT To: Alla Feeling, NP, Kelli Hope, LPN, #  Lacie--Dr. Verlon Au practice doesn't seem to have a website and the number I called today 10/22/18 said their office was closed for the weekend. I have your note printed and one of [place] can fax it first thing Monday morning.   Thanks,  Maire Govan ----- Message ----- From: Alla Feeling, NP Sent: 10/21/2018   5:13 PM EDT To: Kelli Hope, LPN, [facility]  Santiago Glad, or my nurse covering 9/18, Please fax my note to her primary provider Dr. Valma Cava at [facility] with my recommendations from yesterday's visit.  Thanks, Regan Rakers

## 2018-11-15 DIAGNOSIS — E663 Overweight: Secondary | ICD-10-CM | POA: Diagnosis not present

## 2018-11-15 DIAGNOSIS — Z1231 Encounter for screening mammogram for malignant neoplasm of breast: Secondary | ICD-10-CM | POA: Diagnosis not present

## 2018-11-15 DIAGNOSIS — Z1501 Genetic susceptibility to malignant neoplasm of breast: Secondary | ICD-10-CM | POA: Diagnosis not present

## 2018-11-15 DIAGNOSIS — Z01419 Encounter for gynecological examination (general) (routine) without abnormal findings: Secondary | ICD-10-CM | POA: Diagnosis not present

## 2018-11-15 DIAGNOSIS — Z720 Tobacco use: Secondary | ICD-10-CM | POA: Diagnosis not present

## 2018-11-15 DIAGNOSIS — Z23 Encounter for immunization: Secondary | ICD-10-CM | POA: Diagnosis not present

## 2019-01-10 ENCOUNTER — Telehealth: Payer: Self-pay | Admitting: Hematology

## 2019-01-10 NOTE — Telephone Encounter (Signed)
Called patient per providers request, patient would like to keep appointment as is.

## 2019-01-13 NOTE — Progress Notes (Signed)
[place]   [phone] [phone]   Clinic Follow up Note   Patient Care Team: Patient, [name] as PCP - General (General Practice) Armbruster, Carlota Raspberry, MD as Consulting Physician (Gastroenterology) Eyvonne Mechanic as Counselor (Licensed Clinical Social Worker) Truitt Merle, MD as Consulting Physician (Hematology) Alla Feeling, NP as Nurse Practitioner (Nurse Practitioner)  Date of Service:  01/14/2019  CHIEF COMPLAINT: F/u of CHEK2 mutation, high risk for cancer  CURRENT THERAPY:  Tamoxifen 20 mg daily starting 10/2018   INTERVAL HISTORY:  Abigail Berg is here for a follow up. She presents to the clinic with her friend. She notes she started Tamoxifen and has tolerated moderately well. She notes having fatigue and nausea which she did not have this before starting. She notes her currently energy level has dropped to 60%. She never use to take naps and now she takes naps as needed because she gets tired easily. She notes her nausea has stabilized and mostly at night when she lays down. She is still able to eat adequately and maintain weight.  She notes she smokes 1/2ppd and has been smoking for 25 years. She understands she should quit.  She has partial colectomy and appendectomy. She also notes breast surgery in the past.   REVIEW OF SYSTEMS:   Constitutional: Denies fevers, chills or abnormal weight loss (+)Increased fatigue  Eyes: Denies blurriness of vision Ears, nose, mouth, throat, and face: Denies mucositis or sore throat Respiratory: Denies cough, dyspnea or wheezes Cardiovascular: Denies palpitation, chest discomfort or lower extremity swelling Gastrointestinal:  Denies heartburn or change in bowel habits (+) Nausea, manageable  Skin: Denies abnormal skin rashes Lymphatics: Denies new lymphadenopathy or easy bruising Neurological:Denies numbness, tingling or new weaknesses Behavioral/Psych: Mood is stable, no new changes  All other  systems were reviewed with the patient and are negative.  MEDICAL HISTORY:  Past Medical History:  Diagnosis Date  . Depression   . Family history of colon cancer   . Monoallelic mutation of CHEK2 gene in female patient   . Personal history of colonic polyps     SURGICAL HISTORY: Past Surgical History:  Procedure Laterality Date  . BREAST SURGERY    . COLON SURGERY     2017  . COLONOSCOPY    . POLYPECTOMY      I have reviewed the social history and family history with the patient and they are unchanged from previous note.  ALLERGIES:  has No Known Allergies.  MEDICATIONS:  Current Outpatient Medications  Medication Sig Dispense Refill  . escitalopram (LEXAPRO) 20 MG tablet Take 20 mg by mouth daily.    . tamoxifen (NOLVADEX) 20 MG tablet Take 1 tablet (20 mg total) by mouth daily. 30 tablet 5   No current facility-administered medications for this visit.    PHYSICAL EXAMINATION: ECOG PERFORMANCE STATUS: 1 - Symptomatic but completely ambulatory  Vitals:   01/14/19 1435  BP: 131/72  Pulse: 83  Resp: 18  Temp: 98.7 F (37.1 C)  SpO2: 98%   Filed Weights   01/14/19 1435  Weight: 176 lb 14.4 oz (80.2 kg)    GENERAL:alert, no distress and comfortable SKIN: skin color, texture, turgor are normal, no rashes or significant lesions NEURO: alert & oriented x 3 with fluent speech The rest of exam was deferred today   LABORATORY DATA:  I have reviewed the data as listed CBC Latest Ref Rng & Units 01/14/2019  WBC 4.0 - 10.5 K/uL 7.4  Hemoglobin  12.0 - 15.0 g/dL 15.2(H)  Hematocrit 36.0 - 46.0 % 43.6  Platelets 150 - 400 K/uL 218     CMP Latest Ref Rng & Units 01/14/2019  Glucose 70 - 99 mg/dL 108(H)  BUN 6 - 20 mg/dL 17  Creatinine 0.44 - 1.00 mg/dL 0.86  Sodium 135 - 145 mmol/L 139  Potassium 3.5 - 5.1 mmol/L 4.1  Chloride 98 - 111 mmol/L 109  CO2 22 - 32 mmol/L 24  Calcium 8.9 - 10.3 mg/dL 10.4(H)  Total Protein 6.5 - 8.1 g/dL 6.7  Total Bilirubin 0.3  - 1.2 mg/dL <0.2(L)  Alkaline Phos 38 - 126 U/L 69  AST 15 - 41 U/L 12(L)  ALT 0 - 44 U/L 7      RADIOGRAPHIC STUDIES: I have personally reviewed the radiological images as listed and agreed with the findings in the report. No results found.   ASSESSMENT & PLAN:  Abigail Berg is a 53 y.o. female with   1. 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   -We previously discussed that [place] 1100delC mutation is associated with 2-4 times high risk of breast cancer than general population, especially in people with strong family history of breast cancer.  Her estimated lifetime risk of breast cancer is probably around 25-35% -According to NCCN guidelines, annual screening breast MRI in addition to mammogram is recommended for early breast cancer detection. I also recommend self breast exam, and physician breast exam twice a year. Her GYN will order for her to be done locally  -We also discussed the moderately increased risk of colon cancer from CHEK2 mutation; she will f/u with Dr. Havery Moros for screening colonoscopies, last done 07/2018 with 12 polyps. I recommend to repeat in next 2 years.  -She started chemoprevention with Tamoxifen once daily in 10/2018.  -She has tolerated Tamoxifen moderately well but attributes recent Fatigue and mild nausea to Tamoxifen. I discussed since she is pre-menopausal AI is not an option for her. If she is not able to tolerate she is fine to stop Tamoxifen and continue close cancer screening, we also discussed the option of low dose tamoxifen ('10mg'$  daily) or short duration of Tamoxifen (3 years) based on limited datea. After lengthy discussion she opted to continue Tamoxifen.  -Labs reviewed, Hg 15.2, likely secondary to smoking. Breast exam deferred today.  -Her Gynecologist will manage her Mammogram and MRI screenings. I encouraged her to have her Gyn send reports to me.  -F/u in 6 months    2.  Fatigue, nausea  -Onset since she started Tamoxifen  -She is able to eat adequately, maintain weight and take care of herself but gets tired very easily.  -I suspect the mood and mental effects from Tamoxifen can lead to her fatigue.  -I encouraged her to increase exercise and eat healthy balanced diet.    3. Health Maintenance, Smoking Cessation  -She will continue f/u with PCP, OB/Gyn for routine health maintenance and other age appropriate cancer screenings  -We previously discussed maintaining a healthy lifestyle. -She has been smoking for 25 years. She is currently smoking 1/2ppd. She has mild elevated Hg and can negatively effect her overall health. I again reviewed smoking cessation.  4. S/p ileocecectomy on 08/14/15 due to benign colon polyp invading appendix, history of recurrent colon polyps   5. H/o right breast fibroadenoma, S/p right surgical resection on 11/22/15    PLAN:  -Continue screenings scans (annual mammogram and breast  MRI with 6 months apart) with Gyn  -Continue Tamoxifen  -Lab and F/u in 6 months  -will send a message to GI for her f/u colonoscopy    No problem-specific Assessment & Plan notes found for this encounter.   No orders of the defined types were placed in this encounter.  All questions were answered. The patient knows to call the clinic with any problems, questions or concerns. No barriers to learning was detected. I spent 20 minutes counseling the patient face to face. The total time spent in the appointment was 25 minutes and more than 50% was on counseling and review of test results     Truitt Merle, MD 01/14/2019   I, Joslyn Devon, am acting as scribe for Truitt Merle, MD.   I have reviewed the above documentation for accuracy and completeness, and I agree with the above.

## 2019-01-14 ENCOUNTER — Other Ambulatory Visit: Payer: Self-pay | Admitting: Hematology

## 2019-01-14 ENCOUNTER — Other Ambulatory Visit: Payer: Self-pay

## 2019-01-14 ENCOUNTER — Inpatient Hospital Stay (HOSPITAL_BASED_OUTPATIENT_CLINIC_OR_DEPARTMENT_OTHER): Payer: BC Managed Care – PPO | Admitting: Hematology

## 2019-01-14 ENCOUNTER — Inpatient Hospital Stay: Payer: BC Managed Care – PPO | Attending: Genetic Counselor

## 2019-01-14 ENCOUNTER — Encounter: Payer: Self-pay | Admitting: Hematology

## 2019-01-14 VITALS — BP 131/72 | HR 83 | Temp 98.7°F | Resp 18 | Ht 68.0 in | Wt 176.9 lb

## 2019-01-14 DIAGNOSIS — Z79810 Long term (current) use of selective estrogen receptor modulators (SERMs): Secondary | ICD-10-CM | POA: Diagnosis not present

## 2019-01-14 DIAGNOSIS — Z1589 Genetic susceptibility to other disease: Secondary | ICD-10-CM

## 2019-01-14 DIAGNOSIS — R110 Nausea: Secondary | ICD-10-CM | POA: Diagnosis not present

## 2019-01-14 DIAGNOSIS — Z1509 Genetic susceptibility to other malignant neoplasm: Secondary | ICD-10-CM

## 2019-01-14 DIAGNOSIS — Z800 Family history of malignant neoplasm of digestive organs: Secondary | ICD-10-CM | POA: Diagnosis not present

## 2019-01-14 DIAGNOSIS — Z1502 Genetic susceptibility to malignant neoplasm of ovary: Secondary | ICD-10-CM

## 2019-01-14 DIAGNOSIS — Z1501 Genetic susceptibility to malignant neoplasm of breast: Secondary | ICD-10-CM | POA: Diagnosis not present

## 2019-01-14 DIAGNOSIS — R5383 Other fatigue: Secondary | ICD-10-CM | POA: Diagnosis not present

## 2019-01-14 LAB — CBC WITH DIFFERENTIAL (CANCER CENTER ONLY)
Abs Immature Granulocytes: 0.02 10*3/uL (ref 0.00–0.07)
Basophils Absolute: 0.1 10*3/uL (ref 0.0–0.1)
Basophils Relative: 1 %
Eosinophils Absolute: 0.1 10*3/uL (ref 0.0–0.5)
Eosinophils Relative: 1 %
HCT: 43.6 % (ref 36.0–46.0)
Hemoglobin: 15.2 g/dL — ABNORMAL HIGH (ref 12.0–15.0)
Immature Granulocytes: 0 %
Lymphocytes Relative: 36 %
Lymphs Abs: 2.7 10*3/uL (ref 0.7–4.0)
MCH: 32 pg (ref 26.0–34.0)
MCHC: 34.9 g/dL (ref 30.0–36.0)
MCV: 91.8 fL (ref 80.0–100.0)
Monocytes Absolute: 0.4 10*3/uL (ref 0.1–1.0)
Monocytes Relative: 5 %
Neutro Abs: 4.2 10*3/uL (ref 1.7–7.7)
Neutrophils Relative %: 57 %
Platelet Count: 218 10*3/uL (ref 150–400)
RBC: 4.75 MIL/uL (ref 3.87–5.11)
RDW: 12.9 % (ref 11.5–15.5)
WBC Count: 7.4 10*3/uL (ref 4.0–10.5)
nRBC: 0 % (ref 0.0–0.2)

## 2019-01-14 LAB — CMP (CANCER CENTER ONLY)
ALT: 7 U/L (ref 0–44)
AST: 12 U/L — ABNORMAL LOW (ref 15–41)
Albumin: 3.8 g/dL (ref 3.5–5.0)
Alkaline Phosphatase: 69 U/L (ref 38–126)
Anion gap: 6 (ref 5–15)
BUN: 17 mg/dL (ref 6–20)
CO2: 24 mmol/L (ref 22–32)
Calcium: 10.4 mg/dL — ABNORMAL HIGH (ref 8.9–10.3)
Chloride: 109 mmol/L (ref 98–111)
Creatinine: 0.86 mg/dL (ref 0.44–1.00)
GFR, Est AFR Am: >60 mL/min (ref >60)
GFR, Estimated: >60 mL/min (ref >60)
Glucose, Bld: 108 mg/dL — ABNORMAL HIGH (ref 70–99)
Potassium: 4.1 mmol/L (ref 3.5–5.1)
Sodium: 139 mmol/L (ref 135–145)
Total Bilirubin: <0.2 mg/dL — ABNORMAL LOW (ref 0.3–1.2)
Total Protein: 6.7 g/dL (ref 6.5–8.1)

## 2019-01-14 MED ORDER — TAMOXIFEN CITRATE 20 MG PO TABS: 20.0000 mg | ORAL_TABLET | Freq: Every day | ORAL | 5 refills | Status: DC

## 2019-01-17 ENCOUNTER — Telehealth: Payer: Self-pay | Admitting: Hematology

## 2019-01-17 ENCOUNTER — Telehealth: Payer: Self-pay

## 2019-01-17 NOTE — Telephone Encounter (Signed)
Scheduled appt per 12/11 los.  Spoke with pt and she is aware of her appt date and time

## 2019-01-17 NOTE — Telephone Encounter (Signed)
Called patient and let her know Dr. Havery Moros had been in touch with Dr. Burr Medico and that he would like to see her for an office visit. In 6 months(1 yr. after her last colonoscopy). That he would discuss with her at that time when her next colonoscopy should be. (1 or 2 yr. Repeat). Patient was good with that

## 2019-01-17 NOTE — Telephone Encounter (Signed)
-----  Message from Yetta Flock, MD sent at 01/17/2019  8:17 AM EST ----- Sherlynn Stalls can you please let the patient know I've been in touch with Dr. Burr Medico. I'd like to see her in the office one year from her last colonoscopy, and we may plan on performing her next exam 2 years from her last colonoscopy or so. Will discuss with her in the office. Thanks ----- Message ----- From: Truitt Merle, MD Sent: 01/14/2019   9:16 PM EST To: Alla Feeling, NP, Yetta Flock, MD  Dr. Havery Moros,  Her genetic testing showed CHEK2 (+), so she has increased (about 2 fold) risk of colon cancer. Due to her strong family history of colon cancer and personal history of multiple polyps, she needs close colonoscopy surveillance. NCCN guideline recommends colonoscopy every 5 years, but based on her family history and personal history of polyps, I would think she needs more frequent monitoring, every 2-3 years? She is quite anxious, please let her know when you plan to repeat colonoscopy next time.  Thanks  Krista Blue

## 2019-02-10 ENCOUNTER — Other Ambulatory Visit: Payer: Self-pay | Admitting: Nurse Practitioner

## 2019-02-14 DIAGNOSIS — Z20828 Contact with and (suspected) exposure to other viral communicable diseases: Secondary | ICD-10-CM | POA: Diagnosis not present

## 2019-04-15 DIAGNOSIS — C50911 Malignant neoplasm of unspecified site of right female breast: Secondary | ICD-10-CM | POA: Diagnosis not present

## 2019-05-06 DIAGNOSIS — Z1501 Genetic susceptibility to malignant neoplasm of breast: Secondary | ICD-10-CM | POA: Diagnosis not present

## 2019-05-24 DIAGNOSIS — R928 Other abnormal and inconclusive findings on diagnostic imaging of breast: Secondary | ICD-10-CM | POA: Diagnosis not present

## 2019-06-01 ENCOUNTER — Other Ambulatory Visit: Payer: Self-pay | Admitting: General Surgery

## 2019-06-01 DIAGNOSIS — N6313 Unspecified lump in the right breast, lower outer quadrant: Secondary | ICD-10-CM | POA: Diagnosis not present

## 2019-06-01 DIAGNOSIS — R928 Other abnormal and inconclusive findings on diagnostic imaging of breast: Secondary | ICD-10-CM | POA: Diagnosis not present

## 2019-06-01 DIAGNOSIS — C50911 Malignant neoplasm of unspecified site of right female breast: Secondary | ICD-10-CM | POA: Diagnosis not present

## 2019-06-13 ENCOUNTER — Telehealth: Payer: Self-pay

## 2019-06-13 ENCOUNTER — Encounter: Payer: Self-pay | Admitting: *Deleted

## 2019-06-13 ENCOUNTER — Other Ambulatory Visit: Payer: Self-pay | Admitting: Hematology

## 2019-06-13 ENCOUNTER — Telehealth: Payer: Self-pay | Admitting: *Deleted

## 2019-06-13 DIAGNOSIS — Z1501 Genetic susceptibility to malignant neoplasm of breast: Secondary | ICD-10-CM

## 2019-06-13 DIAGNOSIS — Z1589 Genetic susceptibility to other disease: Secondary | ICD-10-CM

## 2019-06-13 NOTE — Telephone Encounter (Signed)
Breast MRI, ultrasound, needle guided biopsy, and pathology report we received from [facility] was faxed to Dr. Cristal Generous office.

## 2019-06-13 NOTE — Telephone Encounter (Signed)
Called pt to provide navigation resources and contact information. Confirmed appts with Drs Burr Medico and Donne Hazel. Denies questions at this time. Encourage pt to call with needs or concerns. Received verbal understanding.

## 2019-06-13 NOTE — Telephone Encounter (Signed)
Abigail Berg left vm stating that she had an MRI, ultrasound, and biopsy which was positive for cancer.  I returned her call requesting fax copies of these tests.  She states she tried to get them faxed previously but was told "our fax number ws not in their system".  I provided her with my office fax number.  I told her they may require her to sign a release.  She was a going to call them now.  This information was reviewed with Dr. Burr Medico

## 2019-06-16 NOTE — Progress Notes (Signed)
[place]   [phone] Fax:([phone]) [phone]   Clinic Follow up Note   Patient Care Team: Patient, [name] as PCP - General (General Practice) Armbruster, Carlota Raspberry, MD as Consulting Physician (Gastroenterology) Eyvonne Mechanic as Counselor (Licensed Clinical Social Worker) Truitt Merle, MD as Consulting Physician (Hematology) Alla Feeling, NP as Nurse Practitioner (Nurse Practitioner) Mauro Kaufmann, RN as Oncology Nurse Navigator Rockwell Germany, RN as Oncology Nurse Navigator  Date of Service:  06/17/2019  CHIEF COMPLAINT: Newly Diagnosed Right breast cancer, CHEK2 mutation  Oncology History Overview Note  Cancer Staging Malignant neoplasm of upper-outer quadrant of right breast in female, estrogen receptor positive ([place]) Staging form: Breast, AJCC 8th Edition - Clinical stage from 06/01/2019: Stage IA (cT1c, cN0, cM0, G3, ER+, PR-, HER2+) - Signed by Truitt Merle, MD on 06/17/2019    Malignant neoplasm of upper-outer quadrant of right breast in female, estrogen receptor positive ([place])  10/2018 -  Anti-estrogen oral therapy   She was preventively started on Tamoxifen since 10/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 Held after 06/17/19 to proceed with her breast cancer surgery and chemo.    05/06/2019 Breast MRI   IMPRESSION Enhancing right breast mass suspicious for malignancy. Second look [place] if recommended.  No Suspicious enhancing left breast masses.    05/24/2019 Breast US   IMPRESSION 1.8cm irregular right breast mass within the 9:30 position 3.5cm from the nipple is highly suggestive of malignancy. An Ultrasound guided breast biopsy is recommended. The findings of the study and recommendation for biopsy were discussed with the patient directly.    06/01/2019 Cancer Staging   Staging form: Breast, AJCC 8th Edition - Clinical stage from 06/01/2019: Stage IA (cT1c, cN0, cM0, G3, ER+, PR-, HER2+) - Signed by Truitt Merle, MD on 06/17/2019   06/01/2019 Initial  Biopsy   Breast Biopsy - Right Breast Core Bx DIAGNOSIS INFILTRATING DUCT CARCINOMA WITH MINOR DUCTAL CARCINOMA IN SITU COMPNENT OF RIGHT BREAST, CORE NEEDLE BIOPSY    06/01/2019 Receptors her2   ER: greater than 75% of tumor cells show moderate staining  PR: Negative  HER2: Positive 3+ Ki67: 40%   06/17/2019 Initial Diagnosis   Malignant neoplasm of upper-outer quadrant of right breast in female, estrogen receptor positive ([place])      CURRENT THERAPY:  PENDING Surgery   INTERVAL HISTORY:  Abigail Berg is here for a follow up of CHEK2 mutation and newly diagnosed right breast cancer. She was last seen by me 5 months ago. She presents to the clinic today with her best friend Abigail Berg. She notes her screening MRI detected her breast cancer. She did not feel the mass herself. She denies pain or stomach issues or nausea. She does have bone pain in her right hip and leg. She notes the pain only occurs when laying down on that side. She denies back pain. She denies injury to that area but is open to Xray. She denies weight loss and has normal appetite.  She notes her grandmother's sister had breast cancer in her 61s and 3 family members had colon cancer. She notes she had her first period in several months 2 days ago. She notes she has been having difficulty reaching Dr Havery Moros for another colonoscopy. She notes she will think about Dignicap but given the cost she is not as interested.    REVIEW OF SYSTEMS:   Constitutional: Denies fevers, chills or abnormal weight loss Eyes: Denies blurriness of vision Ears, nose, mouth, throat, and face:  Denies mucositis or sore throat Respiratory: Denies cough, dyspnea or wheezes Cardiovascular: Denies palpitation, chest discomfort or lower extremity swelling Gastrointestinal:  Denies nausea, heartburn or change in bowel habits Skin: Denies abnormal skin rashes MSK: (+) right leg pain from posterior hip to anterior shin Lymphatics: Denies new  lymphadenopathy or easy bruising Neurological:Denies numbness, tingling or new weaknesses Behavioral/Psych: Mood is stable, no new changes  All other systems were reviewed with the patient and are negative.  MEDICAL HISTORY:  Past Medical History:  Diagnosis Date  . Depression   . Family history of colon cancer   . Monoallelic mutation of CHEK2 gene in female patient   . Personal history of colonic polyps     SURGICAL HISTORY: Past Surgical History:  Procedure Laterality Date  . BREAST SURGERY    . COLON SURGERY     2017  . COLONOSCOPY    . POLYPECTOMY      I have reviewed the social history and family history with the patient and they are unchanged from previous note.  ALLERGIES:  has No Known Allergies.  MEDICATIONS:  Current Outpatient Medications  Medication Sig Dispense Refill  . escitalopram (LEXAPRO) 20 MG tablet Take 20 mg by mouth daily.    . tamoxifen (NOLVADEX) 20 MG tablet Take 1 tablet (20 mg total) by mouth daily. 30 tablet 5   No current facility-administered medications for this visit.    PHYSICAL EXAMINATION: ECOG PERFORMANCE STATUS: 0 - Asymptomatic  Vitals:   06/17/19 1251  BP: (!) 145/83  Pulse: 74  Resp: 18  Temp: 98.7 F (37.1 C)  SpO2: 100%   Filed Weights   06/17/19 1251  Weight: 179 lb 12.8 oz (81.6 kg)    GENERAL:alert, no distress and comfortable SKIN: skin color, texture, turgor are normal, no rashes or significant lesions EYES: normal, Conjunctiva are pink and non-injected, sclera clear  NECK: supple, thyroid normal size, non-tender, without nodularity LYMPH:  no palpable lymphadenopathy in the cervical, axillary  LUNGS: clear to auscultation and percussion with normal breathing effort HEART: regular rate & rhythm and no murmurs and no lower extremity edema ABDOMEN:abdomen soft, non-tender and normal bowel sounds Musculoskeletal:no cyanosis of digits and no clubbing  NEURO: alert & oriented x 3 with fluent speech, no focal  motor/sensory deficits BREAST: (+) Small lump 1.5cm about 3.5cm from nipple of right breast. No palpable mass, nodules or adenopathy bilaterally. Breast exam benign.   LABORATORY DATA:  I have reviewed the data as listed CBC Latest Ref Rng & Units 06/17/2019 01/14/2019  WBC 4.0 - 10.5 K/uL 6.8 7.4  Hemoglobin 12.0 - 15.0 g/dL 15.1(H) 15.2(H)  Hematocrit 36.0 - 46.0 % 44.0 43.6  Platelets 150 - 400 K/uL 245 218     CMP Latest Ref Rng & Units 06/17/2019 01/14/2019  Glucose 70 - 99 mg/dL 113(H) 108(H)  BUN 6 - 20 mg/dL 13 17  Creatinine 0.44 - 1.00 mg/dL 0.88 0.86  Sodium 135 - 145 mmol/L 138 139  Potassium 3.5 - 5.1 mmol/L 3.7 4.1  Chloride 98 - 111 mmol/L 106 109  CO2 22 - 32 mmol/L 24 24  Calcium 8.9 - 10.3 mg/dL 10.9(H) 10.4(H)  Total Protein 6.5 - 8.1 g/dL 7.0 6.7  Total Bilirubin 0.3 - 1.2 mg/dL 0.2(L) <0.2(L)  Alkaline Phos 38 - 126 U/L 74 69  AST 15 - 41 U/L 14(L) 12(L)  ALT 0 - 44 U/L 9 7      RADIOGRAPHIC STUDIES: I have personally reviewed the radiological images as  listed and agreed with the findings in the report. No results found.   ASSESSMENT & PLAN:  Abigail Berg is a 54 y.o. female with    1. Malignant neoplasm of upper-outer quadrant of right breast, Stage 1A, c(T1cN0M0), ER+/PR-/HER2+, Grade 3.  -We discussed her image findings and the biopsy results in great details. Her right breast mass was found from screening MRI which is standard for her CHEK2 mutation when her Mammogram 6 months before was negative.  -Given the early stage disease, she is likely a candidate for lumpectomy. I discussed with CHEK2 mutation, prophylactic b/l mastectomy is not standard nor medically necessary, especially given her negative family history of breast cancer. If she wishes to proceed it is understandable. She will consult with Surgeon Dr. Donne Hazel today.  -The risk of recurrence depends on the stage and biology of the tumor. She is early stage, with ER/HER2 positive and PR  negative markers. I discussed this is the more common type of more moderately aggressive tumor.  -I discussed adjuvant chemotherapy is recommended given her high risk feature of HER2 positive cancer to reduce her risk of cancer recurrence. If her surgical tumor is less than 2 cm, with negative nodes, I recommend chemo with weekly Taxol X12 or Docetaxel q3weeks with anti-HER2 Herceptin followed by maintenance Herceptin q3weeks to complete 1 year of treatment. If larger than 2cm I would recommend more intensive regimen, such as [place].  -I discussed the benefit and side effect of chemotherapy and Herceptin with patient in details, especially fatigue, nausea, cytopenias, risk of infections, neuropathy, heart failure, abnormal liver and renal function, etc.  She voiced good understanding, and is interested in chemotherapy. -I discussed monitoring heart function with Echos while on Herceptin and PAC placement due to long term treatment and the option of Dignicap to reduce hair loss. Will review this after surgery.  -I also discussed the benefit of adjuvant radiation after lumpectomy, she will be referred to radiation oncology, unless she decides to have mastectomy  -Given the strong ER expression she would benefit from antiestrogen therapy. Prior to diagnosis she was on Tamoxifen for preventive measures. She still has periods and is likely still perimenopausal. I discussed Tamoxifen is still the main option for her unless she is interested in ovarian suppression injections or BSO. She will think about it. Will hold Tamoxifen for now to proceed with surgery and chemo.  -Labs reviewed, CBC and CMP WNL except Hg 15.1, BG 113, Ca 10.9. Physical exam shows small right breast mass.  -Given her recent right back, hip and leg pain and hypercalcemia, I will obtain bone scan. She is agreeable.  -F/u 2 weeks after surgery.    2. 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   -We previously discussed that [place] 1100delC mutation is associated with 2-4 times high risk of breast cancer than general population, especially in people with strong family history of breast cancer (she only has 1 family member with breast cancer and 3 with colon cancer). Her estimated lifetime risk of breast cancer is probably around 25-35% -She will continue annual screening breast MRI in addition to mammogram, self breast exam, and physician breast exam twice a year.Her GYN will order for her to be done locally.  -We also discussed the moderately increased risk of colon cancer from CHEK2 mutation; she will f/u with Dr. Havery Moros for screening colonoscopies, last done 07/2018 with 12 polyps. I recommend to repeat in next  2 years.  -She started chemoprevention with Tamoxifen once daily in 10/2018. She has tolerated Tamoxifen moderately well but attributes recent Fatigue and mild nausea to Tamoxifen. Stopped on 06/17/19 to proceed with start of breast cancer treatment.    2. Fatigue, nausea  -Onset since she started Tamoxifen  -She is able to eat adequately, maintain weight and take care of herself but gets tired very easily.  -I suspect the mood and mental effects from Tamoxifen can lead to her fatigue.  -I encouraged her to increase exercise and eat healthy balanced diet.    3. Health Maintenance, Smoking Cessation  -She will continue f/u with PCP, OB/Gyn for routine health maintenance and other age appropriate cancer screenings  -We previously discussed maintaining a healthy lifestyle. -She has been smoking for 25 years. She is currently smoking 1/2ppd. She has mild elevated Hg and can negatively effect her overall health. I again reviewed smoking cessation.   4. S/p ileocecectomy on 08/14/15 due to benign colon polyp invading appendix, history of recurrent colon polyps   5. H/o right breast fibroadenoma, S/p right surgical resection on 11/22/15   6. Right Leg  pain  -She notes in the past 8 months she has been having right leg pain from posterior hip to anterior shin -I do not have high suspicion this is related to her breast cancer, but I will obtain bone scan for further evaluation. She is agreeable.   7. Hypercalcemia  -She is not on Calcium or Vit D.  -I recommend lab for PTH levels which can lead to high levels of calcium. She is agreeable.  -I discussed the possibility this could be related to cancer in the bone, but given her early stage breast cancer I do not suspected this is related.    PLAN:  -Lab today for PTH and rPTH -Bone scan next week  -Send message to Dr Havery Moros about next colonoscopy  -Proceed with Breast surgery and port placement  -F/u 2 weeks after surgery    No problem-specific Assessment & Plan notes found for this encounter.   Orders Placed This Encounter  Procedures  . NM Bone Scan Whole Body    Standing Status:   Future    Standing Expiration Date:   06/16/2020    Order Specific Question:   If indicated for the ordered procedure, I authorize the administration of a radiopharmaceutical per Radiology protocol    Answer:   Yes    Order Specific Question:   Is the patient pregnant?    Answer:   No    Order Specific Question:   Preferred imaging location?    Answer:   [facility]    Order Specific Question:   Radiology Contrast Protocol - do NOT remove file path    Answer:   [email or web address]  . PTH related peptide    Standing Status:   Future    Number of Occurrences:   1    Standing Expiration Date:   06/16/2020  . PTH, intact and calcium    Standing Status:   Future    Number of Occurrences:   1    Standing Expiration Date:   06/16/2020  . ECHOCARDIOGRAM COMPLETE    Standing Status:   Future    Standing Expiration Date:   09/16/2020    Order Specific Question:   Where should this test be performed    Answer:   [facility]    Order Specific Question:   Perflutren  DEFINITY (image enhancing agent)  should be administered unless hypersensitivity or allergy exist    Answer:   Administer Perflutren    Order Specific Question:   Is a special reader required? (athlete or structural heart)    Answer:   Yes    Order Specific Question:   Reason for exam-Echo    Answer:   Chemotherapy evaluation  v87.41 / v58.11    Order Specific Question:   Release to patient    Answer:   Immediate   All questions were answered. The patient knows to call the clinic with any problems, questions or concerns. No barriers to learning was detected. The total time spent in the appointment was 45 minutes.     Truitt Merle, MD 06/17/2019   I, Joslyn Devon, am acting as scribe for Truitt Merle, MD.   I have reviewed the above documentation for accuracy and completeness, and I agree with the above.

## 2019-06-17 ENCOUNTER — Other Ambulatory Visit: Payer: Self-pay

## 2019-06-17 ENCOUNTER — Inpatient Hospital Stay: Payer: BC Managed Care – PPO

## 2019-06-17 ENCOUNTER — Inpatient Hospital Stay: Payer: BC Managed Care – PPO | Attending: Hematology | Admitting: Hematology

## 2019-06-17 ENCOUNTER — Encounter: Payer: Self-pay | Admitting: Hematology

## 2019-06-17 ENCOUNTER — Other Ambulatory Visit: Payer: Self-pay | Admitting: General Surgery

## 2019-06-17 ENCOUNTER — Ambulatory Visit: Payer: BC Managed Care – PPO

## 2019-06-17 DIAGNOSIS — Z1501 Genetic susceptibility to malignant neoplasm of breast: Secondary | ICD-10-CM | POA: Insufficient documentation

## 2019-06-17 DIAGNOSIS — C50919 Malignant neoplasm of unspecified site of unspecified female breast: Secondary | ICD-10-CM | POA: Insufficient documentation

## 2019-06-17 DIAGNOSIS — R110 Nausea: Secondary | ICD-10-CM | POA: Diagnosis not present

## 2019-06-17 DIAGNOSIS — Z8719 Personal history of other diseases of the digestive system: Secondary | ICD-10-CM | POA: Insufficient documentation

## 2019-06-17 DIAGNOSIS — C50411 Malignant neoplasm of upper-outer quadrant of right female breast: Secondary | ICD-10-CM

## 2019-06-17 DIAGNOSIS — Z1509 Genetic susceptibility to other malignant neoplasm: Secondary | ICD-10-CM | POA: Insufficient documentation

## 2019-06-17 DIAGNOSIS — Z170 Estrogen receptor positive status [ER+]: Secondary | ICD-10-CM | POA: Diagnosis not present

## 2019-06-17 DIAGNOSIS — R5383 Other fatigue: Secondary | ICD-10-CM | POA: Diagnosis not present

## 2019-06-17 DIAGNOSIS — Z86018 Personal history of other benign neoplasm: Secondary | ICD-10-CM | POA: Insufficient documentation

## 2019-06-17 DIAGNOSIS — Z1589 Genetic susceptibility to other disease: Secondary | ICD-10-CM

## 2019-06-17 DIAGNOSIS — Z803 Family history of malignant neoplasm of breast: Secondary | ICD-10-CM | POA: Insufficient documentation

## 2019-06-17 DIAGNOSIS — Z800 Family history of malignant neoplasm of digestive organs: Secondary | ICD-10-CM | POA: Diagnosis not present

## 2019-06-17 DIAGNOSIS — M79604 Pain in right leg: Secondary | ICD-10-CM | POA: Insufficient documentation

## 2019-06-17 DIAGNOSIS — M25551 Pain in right hip: Secondary | ICD-10-CM | POA: Insufficient documentation

## 2019-06-17 DIAGNOSIS — Z79810 Long term (current) use of selective estrogen receptor modulators (SERMs): Secondary | ICD-10-CM | POA: Diagnosis not present

## 2019-06-17 LAB — CMP (CANCER CENTER ONLY)
ALT: 9 U/L (ref 0–44)
AST: 14 U/L — ABNORMAL LOW (ref 15–41)
Albumin: 3.9 g/dL (ref 3.5–5.0)
Alkaline Phosphatase: 74 U/L (ref 38–126)
Anion gap: 8 (ref 5–15)
BUN: 13 mg/dL (ref 6–20)
CO2: 24 mmol/L (ref 22–32)
Calcium: 10.9 mg/dL — ABNORMAL HIGH (ref 8.9–10.3)
Chloride: 106 mmol/L (ref 98–111)
Creatinine: 0.88 mg/dL (ref 0.44–1.00)
GFR, Est AFR Am: >60 mL/min (ref >60)
GFR, Estimated: >60 mL/min (ref >60)
Glucose, Bld: 113 mg/dL — ABNORMAL HIGH (ref 70–99)
Potassium: 3.7 mmol/L (ref 3.5–5.1)
Sodium: 138 mmol/L (ref 135–145)
Total Bilirubin: 0.2 mg/dL — ABNORMAL LOW (ref 0.3–1.2)
Total Protein: 7 g/dL (ref 6.5–8.1)

## 2019-06-17 LAB — CBC WITH DIFFERENTIAL (CANCER CENTER ONLY)
Abs Immature Granulocytes: 0.02 10*3/uL (ref 0.00–0.07)
Basophils Absolute: 0.1 10*3/uL (ref 0.0–0.1)
Basophils Relative: 1 %
Eosinophils Absolute: 0.1 10*3/uL (ref 0.0–0.5)
Eosinophils Relative: 1 %
HCT: 44 % (ref 36.0–46.0)
Hemoglobin: 15.1 g/dL — ABNORMAL HIGH (ref 12.0–15.0)
Immature Granulocytes: 0 %
Lymphocytes Relative: 39 %
Lymphs Abs: 2.6 10*3/uL (ref 0.7–4.0)
MCH: 31.5 pg (ref 26.0–34.0)
MCHC: 34.3 g/dL (ref 30.0–36.0)
MCV: 91.7 fL (ref 80.0–100.0)
Monocytes Absolute: 0.4 10*3/uL (ref 0.1–1.0)
Monocytes Relative: 5 %
Neutro Abs: 3.7 10*3/uL (ref 1.7–7.7)
Neutrophils Relative %: 54 %
Platelet Count: 245 10*3/uL (ref 150–400)
RBC: 4.8 MIL/uL (ref 3.87–5.11)
RDW: 13.2 % (ref 11.5–15.5)
WBC Count: 6.8 10*3/uL (ref 4.0–10.5)
nRBC: 0 % (ref 0.0–0.2)

## 2019-06-18 ENCOUNTER — Encounter: Payer: Self-pay | Admitting: Hematology

## 2019-06-18 LAB — PTH, INTACT AND CALCIUM
Calcium, Total (PTH): 11.3 mg/dL — ABNORMAL HIGH (ref 8.7–10.2)
PTH: 58 pg/mL (ref 15–65)

## 2019-06-20 ENCOUNTER — Other Ambulatory Visit: Payer: Self-pay | Admitting: General Surgery

## 2019-06-20 DIAGNOSIS — C50411 Malignant neoplasm of upper-outer quadrant of right female breast: Secondary | ICD-10-CM

## 2019-06-20 DIAGNOSIS — Z170 Estrogen receptor positive status [ER+]: Secondary | ICD-10-CM

## 2019-06-21 ENCOUNTER — Encounter: Payer: Self-pay | Admitting: *Deleted

## 2019-06-21 ENCOUNTER — Ambulatory Visit (HOSPITAL_BASED_OUTPATIENT_CLINIC_OR_DEPARTMENT_OTHER): Admit: 2019-06-21 | Payer: BC Managed Care – PPO | Admitting: General Surgery

## 2019-06-21 ENCOUNTER — Encounter (HOSPITAL_BASED_OUTPATIENT_CLINIC_OR_DEPARTMENT_OTHER): Payer: Self-pay

## 2019-06-21 LAB — SURGICAL PATHOLOGY

## 2019-06-21 SURGERY — BREAST LUMPECTOMY WITH RADIOACTIVE SEED AND SENTINEL LYMPH NODE BIOPSY
Anesthesia: General | Site: Breast

## 2019-06-22 ENCOUNTER — Encounter: Payer: Self-pay | Admitting: *Deleted

## 2019-06-22 LAB — PTH-RELATED PEPTIDE: PTH-related peptide: <2 pmol/L

## 2019-06-23 ENCOUNTER — Encounter (HOSPITAL_BASED_OUTPATIENT_CLINIC_OR_DEPARTMENT_OTHER): Payer: Self-pay | Admitting: General Surgery

## 2019-06-23 ENCOUNTER — Other Ambulatory Visit: Payer: Self-pay

## 2019-06-27 ENCOUNTER — Other Ambulatory Visit: Payer: Self-pay

## 2019-06-27 ENCOUNTER — Ambulatory Visit (HOSPITAL_COMMUNITY)
Admission: RE | Admit: 2019-06-27 | Discharge: 2019-06-27 | Disposition: A | Discharge status: Home / Self Care | Payer: BC Managed Care – PPO | Source: Ambulatory Visit | Attending: Hematology | Admitting: Hematology

## 2019-06-27 ENCOUNTER — Other Ambulatory Visit (HOSPITAL_COMMUNITY)
Admission: RE | Admit: 2019-06-27 | Discharge: 2019-06-27 | Disposition: A | Discharge status: Home / Self Care | Payer: BC Managed Care – PPO | Source: Ambulatory Visit | Attending: General Surgery | Admitting: General Surgery

## 2019-06-27 DIAGNOSIS — Z01818 Encounter for other preprocedural examination: Secondary | ICD-10-CM | POA: Diagnosis not present

## 2019-06-27 DIAGNOSIS — Z170 Estrogen receptor positive status [ER+]: Secondary | ICD-10-CM | POA: Diagnosis not present

## 2019-06-27 DIAGNOSIS — C50411 Malignant neoplasm of upper-outer quadrant of right female breast: Secondary | ICD-10-CM

## 2019-06-27 DIAGNOSIS — F17200 Nicotine dependence, unspecified, uncomplicated: Secondary | ICD-10-CM | POA: Insufficient documentation

## 2019-06-27 LAB — SARS CORONAVIRUS 2 (TAT 6-24 HRS): SARS Coronavirus 2: NEGATIVE

## 2019-06-27 MED ORDER — ENSURE PRE-SURGERY PO LIQD: 296.0000 mL | Freq: Once | ORAL | Status: DC

## 2019-06-27 NOTE — Progress Notes (Signed)
  Echocardiogram 2D Echocardiogram has been performed.  Abigail Berg 06/27/2019, 9:34 AM

## 2019-06-27 NOTE — Progress Notes (Signed)

## 2019-06-28 ENCOUNTER — Ambulatory Visit: Payer: BC Managed Care – PPO | Attending: General Surgery | Admitting: Physical Therapy

## 2019-06-28 ENCOUNTER — Encounter: Payer: Self-pay | Admitting: Physical Therapy

## 2019-06-28 DIAGNOSIS — Z170 Estrogen receptor positive status [ER+]: Secondary | ICD-10-CM | POA: Diagnosis not present

## 2019-06-28 DIAGNOSIS — R293 Abnormal posture: Secondary | ICD-10-CM | POA: Insufficient documentation

## 2019-06-28 DIAGNOSIS — C50411 Malignant neoplasm of upper-outer quadrant of right female breast: Secondary | ICD-10-CM | POA: Insufficient documentation

## 2019-06-28 NOTE — Therapy (Signed)
[place], [place], [address] Phone: [phone]   Fax:  [phone]  Physical Therapy Evaluation  Patient Details  Name: Abigail Berg MRN: [id number] Date of Birth: July 13, 1965 Referring Provider (PT): Donne Hazel   Encounter Date: 06/28/2019  PT End of Session - 06/28/19 1648    Visit Number  1    Number of Visits  2    Date for PT Re-Evaluation  07/26/19    PT Start Time  1602    PT Stop Time  1640    PT Time Calculation (min)  38 min    Activity Tolerance  Patient tolerated treatment well    Behavior During Therapy  [facility] for tasks assessed/performed       Past Medical History:  Diagnosis Date  . Depression   . Family history of colon cancer   . Monoallelic mutation of CHEK2 gene in female patient   . Personal history of colonic polyps     Past Surgical History:  Procedure Laterality Date  . BREAST SURGERY    . COLON SURGERY     2017  . COLONOSCOPY    . POLYPECTOMY      There were no vitals filed for this visit.   Subjective Assessment - 06/28/19 1603    Subjective  I am feeling okay.    Pertinent History  R breast cancer, ER +, PR -, HER 2+, will undergo R breast lumpectomy and SLNB on 06/30/19, stopped Tamoxifen a week ago    Patient Stated Goals  to get info from provider    Currently in Pain?  No/denies    Pain Score  0-No pain         [facility] PT Assessment - 06/28/19 0001      Assessment   Medical Diagnosis  R breast cancer    Referring Provider (PT)  Donne Hazel    Onset Date/Surgical Date  06/30/19    Hand Dominance  Right    Prior Therapy  none      Precautions   Precautions  Other (comment)    Precaution Comments  active cancer      Restrictions   Weight Bearing Restrictions  No      Balance Screen   Has the patient fallen in the past 6 months  No    Has the patient had a decrease in activity level because of a fear of falling?   No    Is the patient reluctant to  leave their home because of a fear of falling?   No      Home Film/video editor residence    Living Arrangements  Spouse/significant other    Available Help at Discharge  Family    Type of [place]      Prior Function   Level of [place]  Retired    Leisure  pt does not currently exercise      Cognition   Overall Cognitive Status  Within Functional Limits for tasks assessed      Posture/Postural Control   Posture/Postural Control  Postural limitations    Postural Limitations  Rounded Shoulders;Forward head      ROM / Strength   AROM / PROM / Strength  AROM      AROM   AROM Assessment Site  Shoulder    Right/Left Shoulder  Right;Left    Right Shoulder Flexion  162 Degrees    Right  Shoulder ABduction  159 Degrees    Right Shoulder Internal Rotation  50 Degrees    Right Shoulder External Rotation  86 Degrees    Left Shoulder Flexion  159 Degrees    Left Shoulder ABduction  164 Degrees    Left Shoulder Internal Rotation  60 Degrees    Left Shoulder External Rotation  75 Degrees        LYMPHEDEMA/ONCOLOGY QUESTIONNAIRE - 06/28/19 1621      Type   Cancer Type  right breast cancer      Surgeries   Lumpectomy Date  06/30/19      Lymphedema Assessments   Lymphedema Assessments  Upper extremities      Right Upper Extremity Lymphedema   15 cm Proximal to Olecranon Process  28.2 cm    Olecranon Process  25 cm    15 cm Proximal to Ulnar Styloid Process  24.3 cm    Just Proximal to Ulnar Styloid Process  16.3 cm    Across Hand at [employer]  20.5 cm    At [place] of 2nd Digit  6.9 cm      Left Upper Extremity Lymphedema   15 cm Proximal to Olecranon Process  27.6 cm    Olecranon Process  25.8 cm    15 cm Proximal to Ulnar Styloid Process  24.6 cm    Just Proximal to Ulnar Styloid Process  16.2 cm    Across Hand at [employer]  21.5 cm    At [place] of 2nd Digit  7.5 cm      L-DEX FLOWSHEETS - 06/28/19 1700       L-DEX LYMPHEDEMA SCREENING   Measurement Type  Unilateral    L-DEX MEASUREMENT EXTREMITY  Upper Extremity    POSITION   Standing    DOMINANT SIDE  Right    At Risk Side  Right    BASELINE SCORE (UNILATERAL)  -0.3        The patient was assessed using the L-Dex machine today to produce a lymphedema index baseline score. The patient will be reassessed on a regular basis (typically every 3 months) to obtain new L-Dex scores. If the score is > 6.5 points away from his/her baseline score indicating onset of subclinical lymphedema, it will be recommended to wear a compression garment for 4 weeks, 12 hours per day and then be reassessed. If the score continues to be > 6.5 points from baseline at reassessment, we will initiate lymphedema treatment. Assessing in this manner has a 95% rate of preventing clinically significant lymphedema.    Katina Dung - 06/28/19 0001    Open a tight or new jar  No difficulty    Do heavy household chores (wash walls, wash floors)  No difficulty    Carry a shopping bag or briefcase  No difficulty    Wash your back  No difficulty    Use a knife to cut food  No difficulty    Recreational activities in which you take some force or impact through your arm, shoulder, or hand (golf, hammering, tennis)  No difficulty    During the past week, to what extent has your arm, shoulder or hand problem interfered with your normal social activities with family, friends, neighbors, or groups?  Not at all    During the past week, to what extent has your arm, shoulder or hand problem limited your work or other regular daily activities  Not at all    Arm, shoulder, or  hand pain.  None    Tingling (pins and needles) in your arm, shoulder, or hand  None    Difficulty Sleeping  No difficulty    DASH Score  0 %        Objective measurements completed on examination: See above findings.              PT Education - 06/28/19 1710    Education Details  ABC class,  lymphedema signs/symptoms, post op breast exercises    Person(s) Educated  Patient    Methods  Explanation;Handout    Comprehension  Verbalized understanding          PT Long Term Goals - 06/28/19 1657      PT LONG TERM GOAL #1   Title  Pt will return to baseline shoulder ROM following R breast lumpectomy.    Time  4    Period  Weeks    Status  New    Target Date  07/26/19      Breast Clinic Goals - 06/28/19 1653      Patient will be able to verbalize understanding of pertinent lymphedema risk reduction practices relevant to her diagnosis specifically related to skin care.   Status  Achieved      Patient will be able to return demonstrate and/or verbalize understanding of the post-op home exercise program related to regaining shoulder range of motion.   Status  Achieved      Patient will be able to verbalize understanding of the importance of attending the postoperative After Breast Cancer Class for further lymphedema risk reduction education and therapeutic exercise.   Status  Achieved            Plan - 06/28/19 1649    Clinical Impression Statement  Pt presents to PT for baseline assessment prior to undergoing R breast lumpectomy and SLNB. She will require radiation and chemotherapy as well. Educated pt today on signs and symptoms of lymphedema and took baseline SOZO measurement. Will remeasure on SOZO in 3 months to assess for subclinical lymphedema. Educated pt in post op shoulder exercises to begin around a week after surgery once she gets clearance from her Psychologist, sport and exercise. Educated pt on ABC class but pt was not interested in signing up at this time. Will reassess pt in 4 weeks after surgery.    Stability/Clinical Decision Making  Stable/Uncomplicated    Clinical Decision Making  Low    Rehab Potential  Good    PT Frequency  [place]    PT Duration  4 weeks    PT Treatment/Interventions  ADLs/Self Care Home Management;Patient/family education;Therapeutic exercise    PT Next  Visit Plan  begin post op exercises once cleared by surgeon    PT Home Exercise Plan  post op breast exercises    Consulted and Agree with Plan of Care  Patient       Patient will benefit from skilled therapeutic intervention in order to improve the following deficits and impairments:  Decreased knowledge of precautions, Postural dysfunction  Visit Diagnosis: Malignant neoplasm of upper-outer quadrant of right breast in female, estrogen receptor positive ([place])  Abnormal posture     Problem List Patient Active Problem List   Diagnosis Date Noted  . Malignant neoplasm of upper-outer quadrant of right breast in female, estrogen receptor positive ([place]) 06/17/2019  . Genetic testing 09/27/2018  . Monoallelic mutation of CHEK2 gene in female patient   . Family history of colon cancer   . Personal history of colonic  polyps     Allyson Sabal [facility] 06/28/2019, 5:11 PM  [place], [place], [address] Phone: [phone]   Fax:  [phone]  Name: Abigail Berg MRN: [id number] Date of Birth: 12/17/1965  Manus Gunning, PT 06/28/19 5:11 PM

## 2019-06-29 ENCOUNTER — Ambulatory Visit
Admission: RE | Admit: 2019-06-29 | Discharge: 2019-06-29 | Disposition: A | Discharge status: Home / Self Care | Payer: BC Managed Care – PPO | Source: Ambulatory Visit | Attending: General Surgery | Admitting: General Surgery

## 2019-06-29 ENCOUNTER — Other Ambulatory Visit: Payer: Self-pay | Admitting: General Surgery

## 2019-06-29 ENCOUNTER — Encounter: Payer: Self-pay | Admitting: *Deleted

## 2019-06-29 ENCOUNTER — Ambulatory Visit (HOSPITAL_COMMUNITY)
Admission: RE | Admit: 2019-06-29 | Discharge: 2019-06-29 | Disposition: A | Discharge status: Home / Self Care | Payer: BC Managed Care – PPO | Source: Ambulatory Visit | Attending: Hematology | Admitting: Hematology

## 2019-06-29 ENCOUNTER — Encounter (HOSPITAL_COMMUNITY)
Admission: RE | Admit: 2019-06-29 | Discharge: 2019-06-29 | Disposition: A | Discharge status: Home / Self Care | Payer: BC Managed Care – PPO | Source: Ambulatory Visit | Attending: Hematology | Admitting: Hematology

## 2019-06-29 ENCOUNTER — Other Ambulatory Visit: Payer: Self-pay

## 2019-06-29 DIAGNOSIS — Z170 Estrogen receptor positive status [ER+]: Secondary | ICD-10-CM

## 2019-06-29 DIAGNOSIS — D0511 Intraductal carcinoma in situ of right breast: Secondary | ICD-10-CM | POA: Diagnosis not present

## 2019-06-29 DIAGNOSIS — Z800 Family history of malignant neoplasm of digestive organs: Secondary | ICD-10-CM | POA: Diagnosis not present

## 2019-06-29 DIAGNOSIS — Z803 Family history of malignant neoplasm of breast: Secondary | ICD-10-CM | POA: Diagnosis not present

## 2019-06-29 DIAGNOSIS — C50411 Malignant neoplasm of upper-outer quadrant of right female breast: Secondary | ICD-10-CM

## 2019-06-29 DIAGNOSIS — F17200 Nicotine dependence, unspecified, uncomplicated: Secondary | ICD-10-CM | POA: Diagnosis not present

## 2019-06-29 DIAGNOSIS — R937 Abnormal findings on diagnostic imaging of other parts of musculoskeletal system: Secondary | ICD-10-CM | POA: Diagnosis not present

## 2019-06-29 DIAGNOSIS — Z79810 Long term (current) use of selective estrogen receptor modulators (SERMs): Secondary | ICD-10-CM | POA: Diagnosis not present

## 2019-06-29 DIAGNOSIS — Z86010 Personal history of colonic polyps: Secondary | ICD-10-CM | POA: Diagnosis not present

## 2019-06-29 DIAGNOSIS — Z9049 Acquired absence of other specified parts of digestive tract: Secondary | ICD-10-CM | POA: Diagnosis not present

## 2019-06-29 DIAGNOSIS — F419 Anxiety disorder, unspecified: Secondary | ICD-10-CM | POA: Diagnosis not present

## 2019-06-29 DIAGNOSIS — C50919 Malignant neoplasm of unspecified site of unspecified female breast: Secondary | ICD-10-CM | POA: Diagnosis not present

## 2019-06-29 IMAGING — MG MM BREAST LOCALIZATION CLIP
2 series · 2 of 2 positions shown · non-contrast
Comparison: Previous exams from [HOSPITAL] [facility]).

CLINICAL DATA: Radioactive seed was placed in a biopsy-proven
cancer in the [DATE] position right breast.

EXAM:
DIAGNOSTIC RIGHT MAMMOGRAM POST ULTRASOUND-GUIDED RADIOACTIVE SEED
PLACEMENT

[R CC]
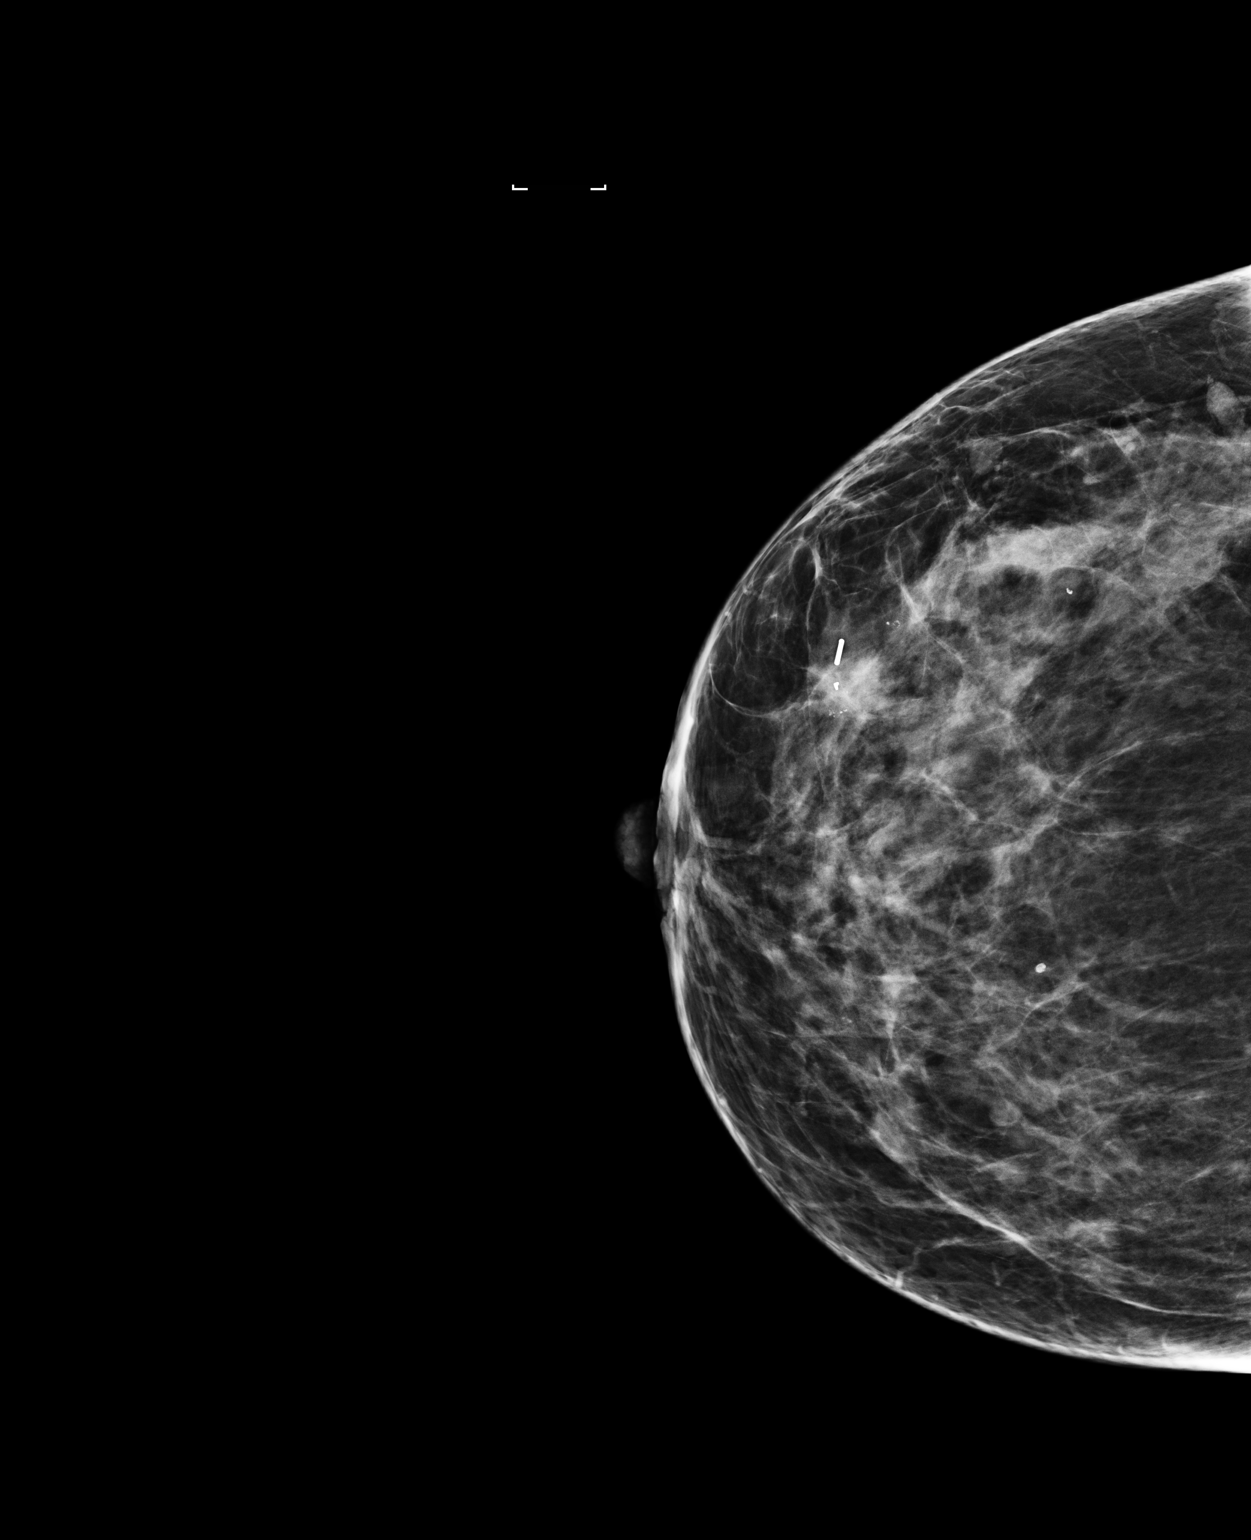

[R ML]
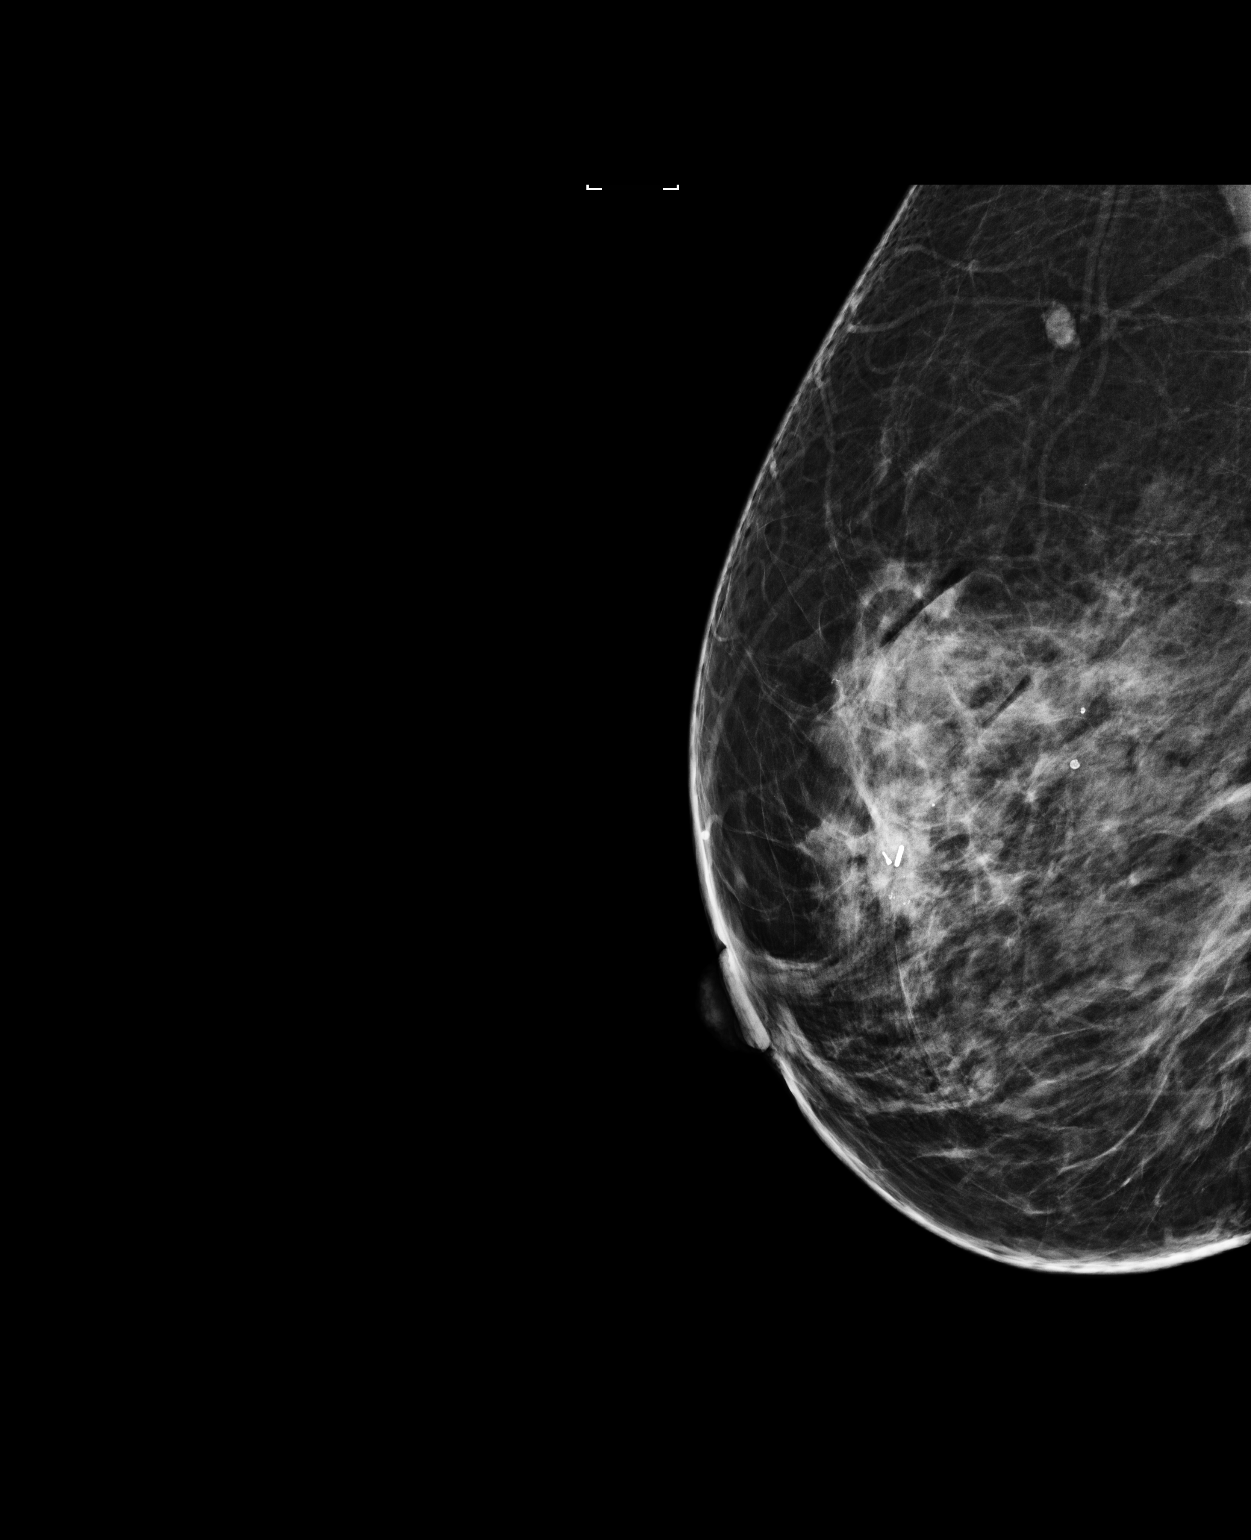

[2 of 2 positions shown; findings below may reference images not displayed]

FINDINGS: Mammographic images were obtained following ultrasound-guided
radioactive seed placement. These demonstrate the radioactive seed
within the lateral aspect of the biopsied mass. Seed is adjacent to
the biopsy clip placed at the time of ultrasound-guided biopsy.
IMPRESSION: Appropriate location of the radioactive seed.

Final Assessment: Post Procedure Mammograms for Seed Placement

## 2019-06-29 IMAGING — US US NEEDLE LOCALIZATION*R*
1 series · 6 of 6 positions shown · non-contrast
Comparison: Previous exam(s).

CLINICAL DATA: 54-year-old patient was diagnosed in [HOSPITAL],
BILLIOT with right breast cancer following ultrasound-guided biopsy
of a mass in the [DATE] position of the right breast 3.5 cm from the
nipple. This mass was discovered on a screening breast MRI and
subsequently identified on an MRI directed ultrasound.

EXAM:
ULTRASOUND GUIDED RADIOACTIVE SEED LOCALIZATION OF THE RIGHT BREAST

[Series 1: us needle localization*right* · 0.07mm/px · 6 of 6 slices shown]
[im 1/6]
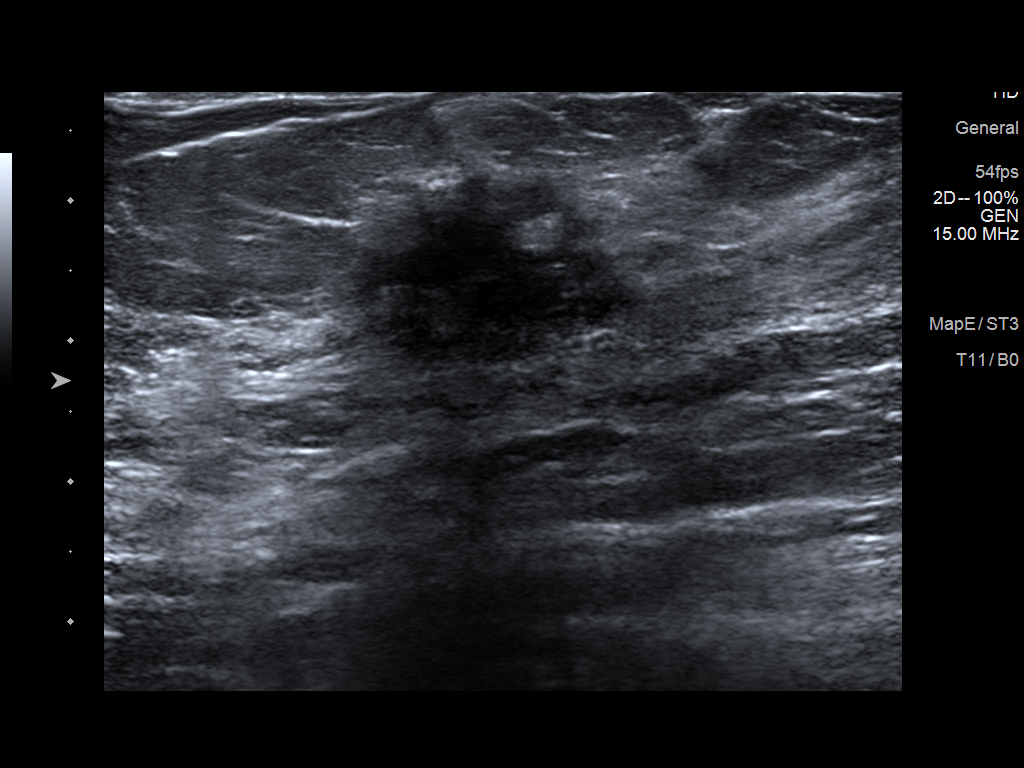
[im 2/6]
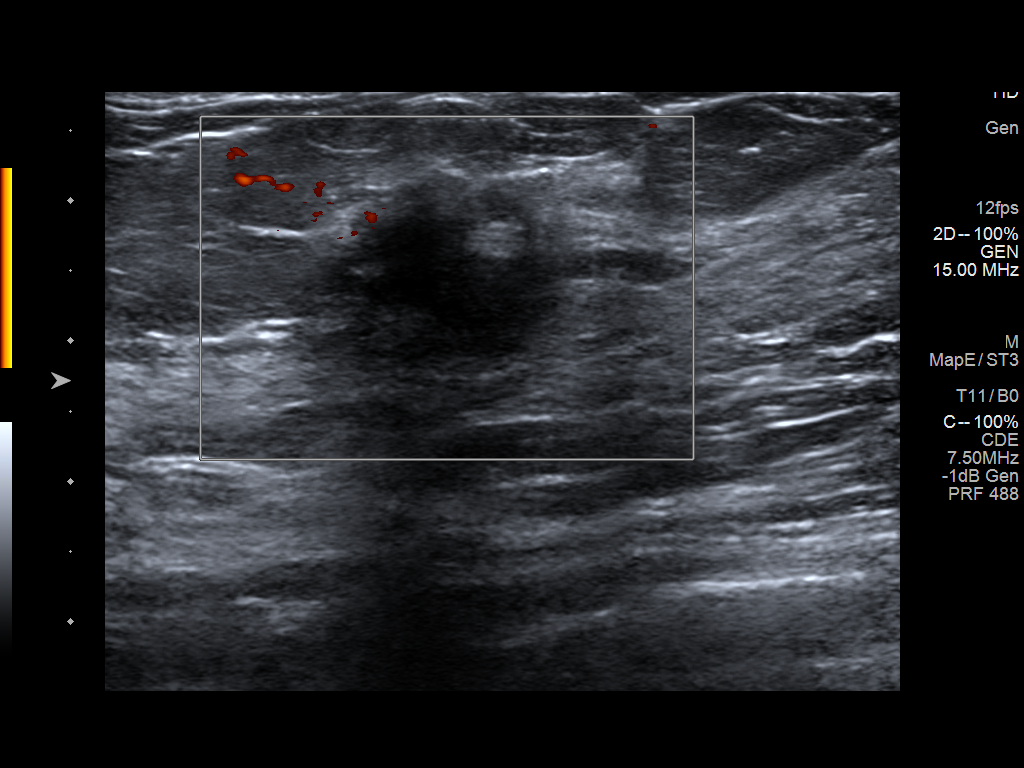
[im 3/6]
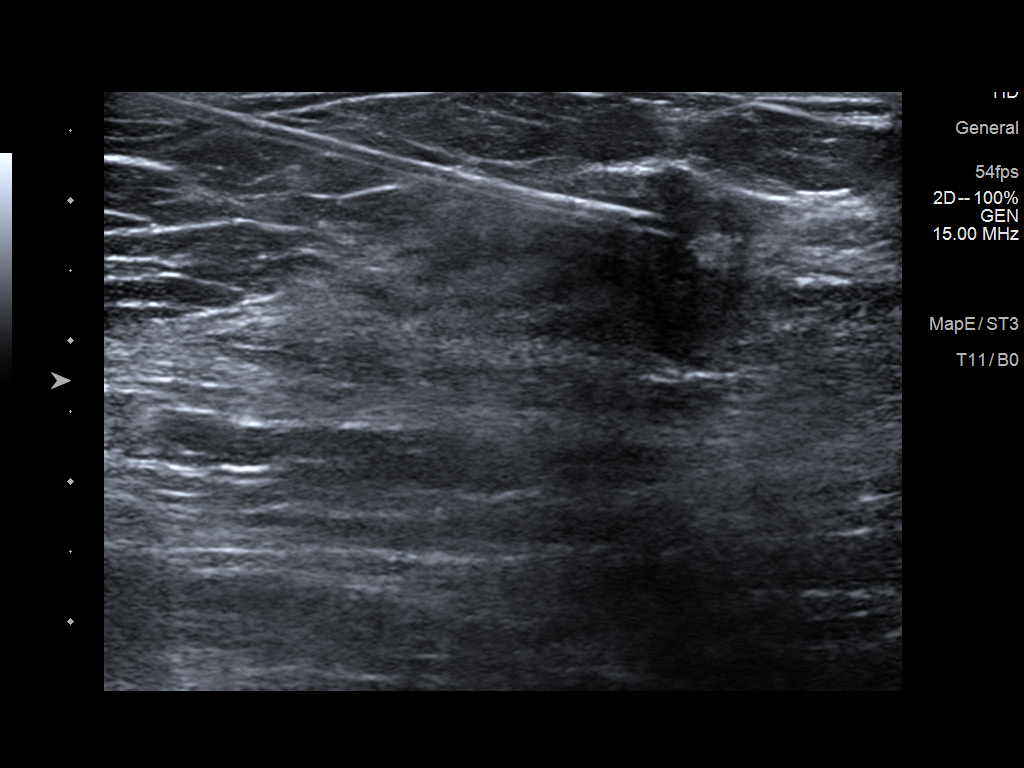
[im 4/6]
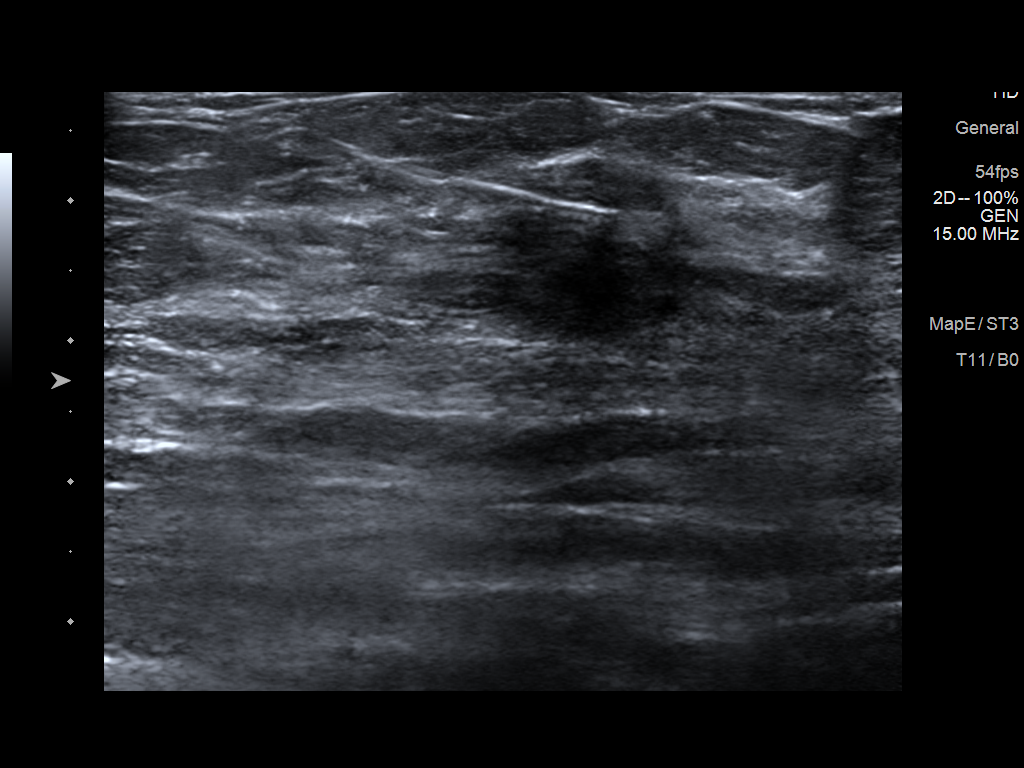
[im 5/6]
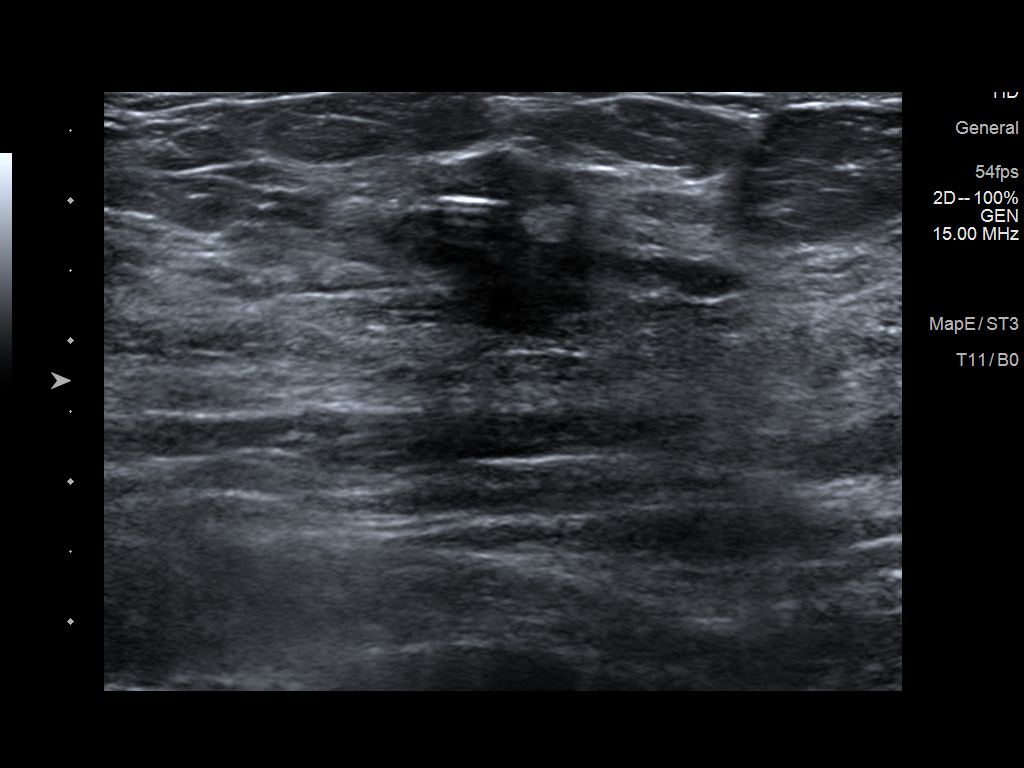
[im 6/6]
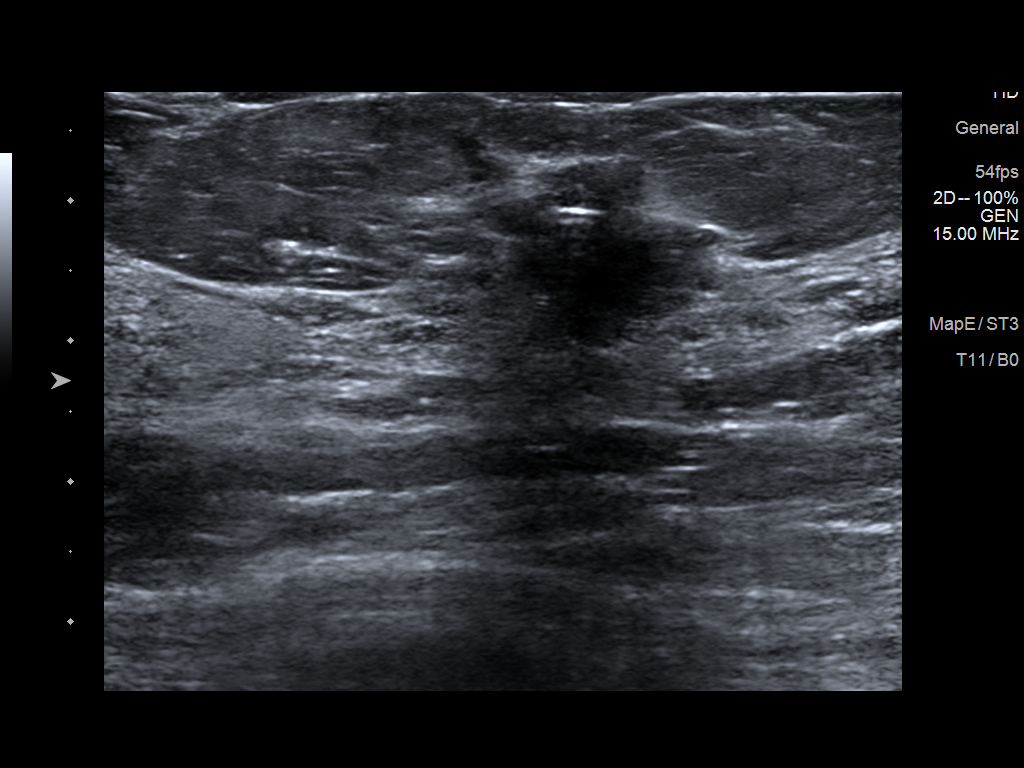

[6 of 6 positions shown; findings below may reference images not displayed]

FINDINGS: Patient presents for radioactive seed localization prior to
lumpectomy. I met with the patient and we discussed the procedure of
seed localization including benefits and alternatives. We discussed
the high likelihood of a successful procedure. We discussed the
risks of the procedure including infection, bleeding, tissue injury
and further surgery. We discussed the low dose of radioactivity
involved in the procedure. Informed, written consent was given.

The usual time-out protocol was performed immediately prior to the
procedure.

Using ultrasound guidance, sterile technique, 1% lidocaine and an
[ZH] radioactive seed, an irregular hypoechoic mass with internal
echogenic clip artifact at [DATE] position 3.5 cm from nipple was
localized using a lateral approach. The follow-up mammogram images
confirm the seed in the expected location and were marked for Dr.
BILLIOT.

Follow-up survey of the patient confirms presence of the radioactive
seed. The patient had a nuclear medicine bone scan earlier today
which currently interferes with assessing the radioactivity from the
seed.

Order number of [ZH] seed:  [PHONE_NUMBER].

Total activity: 0.255 mCi reference Date: [DATE]

The patient tolerated the procedure well and was released from the
[REDACTED]. She was given instructions regarding seed removal.
IMPRESSION: Radioactive seed localization right breast. No apparent
complications.

The patient had a nuclear medicine bone scan today prior to this
procedure and therefore we were unable to assess accurate
radioactive measurements from the seed after the procedure.

## 2019-06-29 MED ORDER — TECHNETIUM TC 99M MEDRONATE IV KIT
21.0000 | PACK | Freq: Once | INTRAVENOUS | Status: AC
  Administered 2019-06-29: 21 via INTRAVENOUS

## 2019-06-30 ENCOUNTER — Ambulatory Visit (HOSPITAL_COMMUNITY): Payer: BC Managed Care – PPO

## 2019-06-30 ENCOUNTER — Ambulatory Visit
Admission: RE | Admit: 2019-06-30 | Discharge: 2019-06-30 | Disposition: A | Discharge status: Home / Self Care | Payer: BC Managed Care – PPO | Source: Ambulatory Visit | Attending: General Surgery | Admitting: General Surgery

## 2019-06-30 ENCOUNTER — Ambulatory Visit (HOSPITAL_BASED_OUTPATIENT_CLINIC_OR_DEPARTMENT_OTHER): Payer: BC Managed Care – PPO | Admitting: Anesthesiology

## 2019-06-30 ENCOUNTER — Other Ambulatory Visit: Payer: Self-pay

## 2019-06-30 ENCOUNTER — Encounter (HOSPITAL_BASED_OUTPATIENT_CLINIC_OR_DEPARTMENT_OTHER): Payer: Self-pay | Admitting: General Surgery

## 2019-06-30 ENCOUNTER — Ambulatory Visit (HOSPITAL_COMMUNITY)
Admission: RE | Admit: 2019-06-30 | Discharge: 2019-06-30 | Disposition: A | Discharge status: Home / Self Care | Payer: BC Managed Care – PPO | Source: Ambulatory Visit | Attending: General Surgery | Admitting: General Surgery

## 2019-06-30 ENCOUNTER — Ambulatory Visit (HOSPITAL_BASED_OUTPATIENT_CLINIC_OR_DEPARTMENT_OTHER)
Admission: RE | Admit: 2019-06-30 | Discharge: 2019-06-30 | Disposition: A | Discharge status: Home / Self Care | Payer: BC Managed Care – PPO | Source: Ambulatory Visit | Attending: General Surgery | Admitting: General Surgery

## 2019-06-30 ENCOUNTER — Encounter (HOSPITAL_BASED_OUTPATIENT_CLINIC_OR_DEPARTMENT_OTHER)
Admission: RE | Disposition: A | Discharge status: Home / Self Care | Payer: Self-pay | Source: Ambulatory Visit | Attending: General Surgery

## 2019-06-30 DIAGNOSIS — C801 Malignant (primary) neoplasm, unspecified: Secondary | ICD-10-CM

## 2019-06-30 DIAGNOSIS — Z800 Family history of malignant neoplasm of digestive organs: Secondary | ICD-10-CM | POA: Diagnosis not present

## 2019-06-30 DIAGNOSIS — Z170 Estrogen receptor positive status [ER+]: Secondary | ICD-10-CM

## 2019-06-30 DIAGNOSIS — D0511 Intraductal carcinoma in situ of right breast: Secondary | ICD-10-CM | POA: Insufficient documentation

## 2019-06-30 DIAGNOSIS — N6091 Unspecified benign mammary dysplasia of right breast: Secondary | ICD-10-CM | POA: Diagnosis not present

## 2019-06-30 DIAGNOSIS — N6011 Diffuse cystic mastopathy of right breast: Secondary | ICD-10-CM | POA: Diagnosis not present

## 2019-06-30 DIAGNOSIS — Z803 Family history of malignant neoplasm of breast: Secondary | ICD-10-CM | POA: Diagnosis not present

## 2019-06-30 DIAGNOSIS — F419 Anxiety disorder, unspecified: Secondary | ICD-10-CM | POA: Insufficient documentation

## 2019-06-30 DIAGNOSIS — G8918 Other acute postprocedural pain: Secondary | ICD-10-CM | POA: Diagnosis not present

## 2019-06-30 DIAGNOSIS — C50411 Malignant neoplasm of upper-outer quadrant of right female breast: Secondary | ICD-10-CM

## 2019-06-30 DIAGNOSIS — F17200 Nicotine dependence, unspecified, uncomplicated: Secondary | ICD-10-CM | POA: Insufficient documentation

## 2019-06-30 DIAGNOSIS — Z79810 Long term (current) use of selective estrogen receptor modulators (SERMs): Secondary | ICD-10-CM | POA: Diagnosis not present

## 2019-06-30 DIAGNOSIS — Z86010 Personal history of colonic polyps: Secondary | ICD-10-CM | POA: Insufficient documentation

## 2019-06-30 DIAGNOSIS — C50911 Malignant neoplasm of unspecified site of right female breast: Secondary | ICD-10-CM | POA: Diagnosis not present

## 2019-06-30 DIAGNOSIS — Z9049 Acquired absence of other specified parts of digestive tract: Secondary | ICD-10-CM | POA: Insufficient documentation

## 2019-06-30 DIAGNOSIS — Z452 Encounter for adjustment and management of vascular access device: Secondary | ICD-10-CM | POA: Diagnosis not present

## 2019-06-30 DIAGNOSIS — Z95828 Presence of other vascular implants and grafts: Secondary | ICD-10-CM

## 2019-06-30 HISTORY — PX: BREAST LUMPECTOMY WITH RADIOACTIVE SEED AND SENTINEL LYMPH NODE BIOPSY: SHX6550

## 2019-06-30 HISTORY — DX: Malignant (primary) neoplasm, unspecified: C80.1

## 2019-06-30 HISTORY — PX: PORTACATH PLACEMENT: SHX2246

## 2019-06-30 HISTORY — PX: BREAST LUMPECTOMY: SHX2

## 2019-06-30 LAB — POCT PREGNANCY, URINE: Preg Test, Ur: NEGATIVE

## 2019-06-30 IMAGING — CR DG CHEST 1V PORT
1 series · 1 of 1 positions shown · non-contrast
Comparison: None.

CLINICAL DATA: Port-A-Cath placement.

EXAM:
PORTABLE CHEST 1 VIEW

[chest ap]
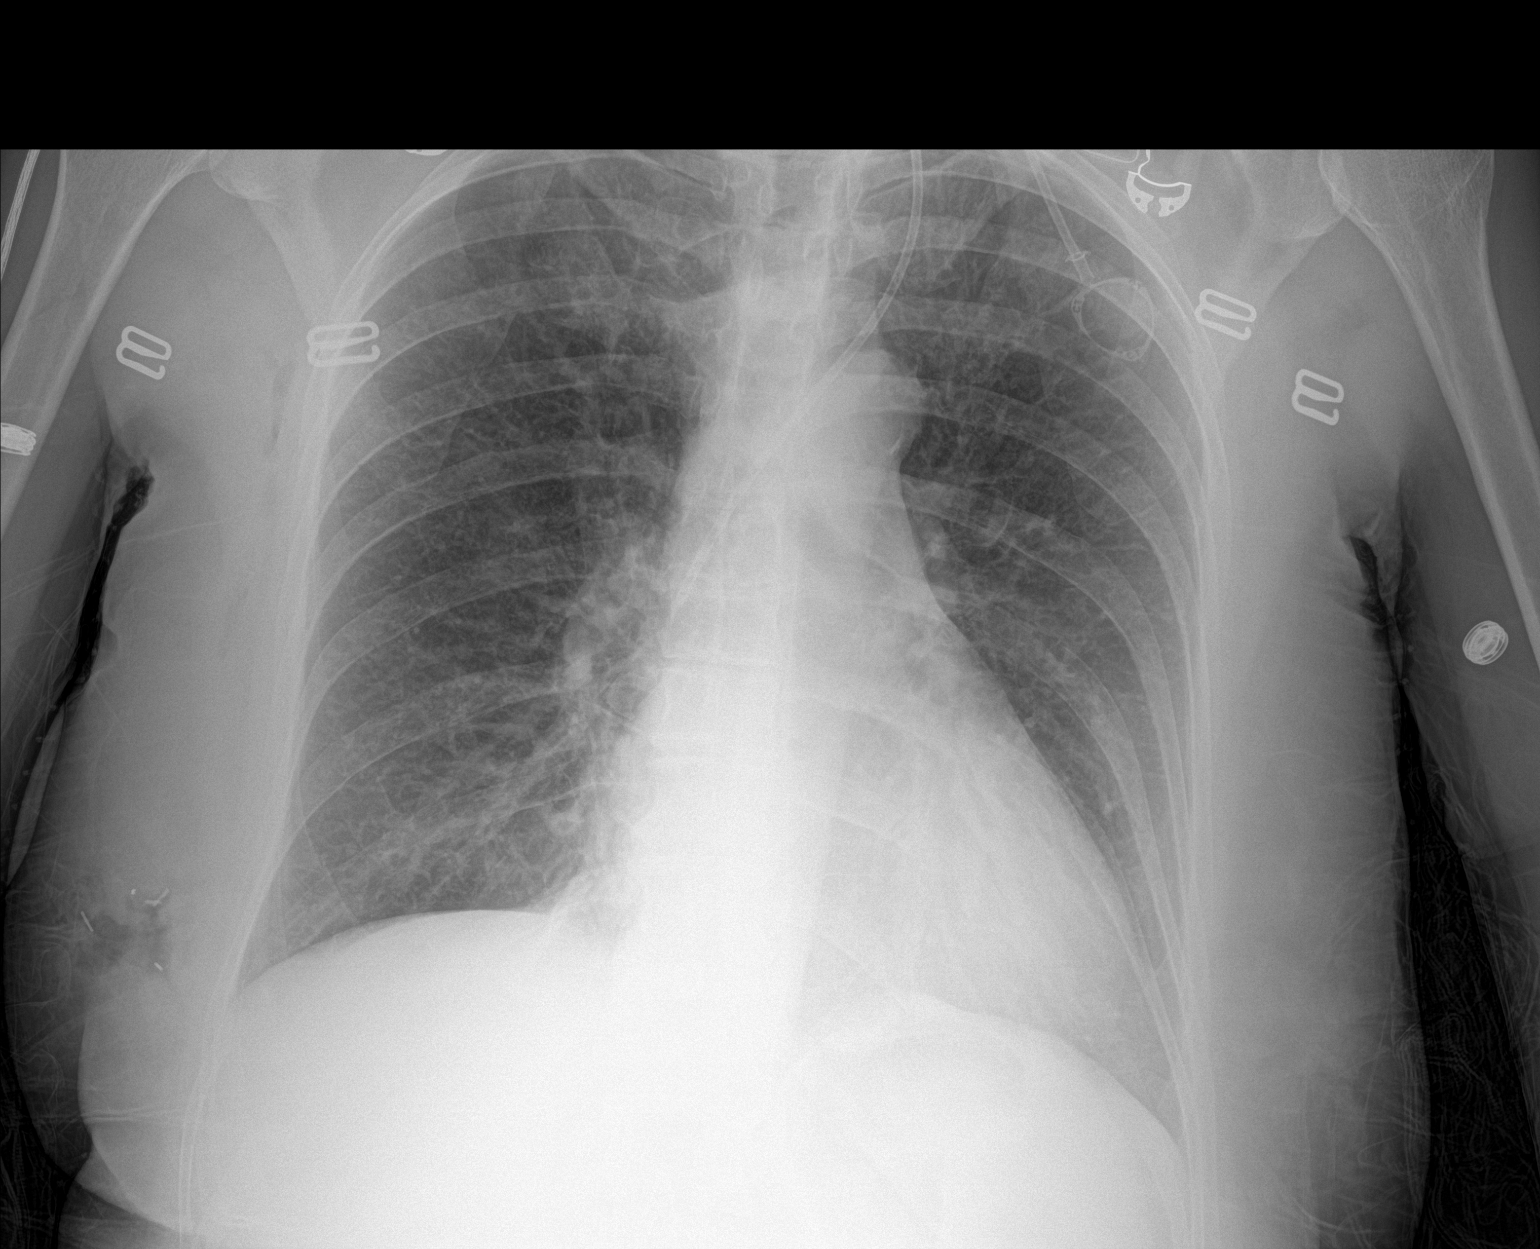

[1 of 1 positions shown; findings below may reference images not displayed]

FINDINGS: [PA] hours. Lungs are mildly hyperexpanded. The cardiopericardial
silhouette is within normal limits for size. Interstitial markings
are diffusely coarsened with chronic features. Left Port-A-Cath tip
overlies the mid distal SVC level. No evidence for left
pneumothorax. No left pleural effusion. Gas in the soft tissues of
the right breast and axilla suggest recent surgery.
IMPRESSION: Left Port-A-Cath tip is in the mid to distal SVC. No pneumothorax or
pleural effusion.

## 2019-06-30 IMAGING — DX MM BREAST SURGICAL SPECIMEN
1 series · 2 of 2 positions shown · non-contrast
Comparison: Previous exam(s).

CLINICAL DATA: Patient status post right breast lumpectomy.

EXAM:
SPECIMEN RADIOGRAPH OF THE RIGHT BREAST

[Series 2: specimen digital x-ray, derived · right · 0.10mm/px · 2 of 2 slices shown]
[im 1/2]
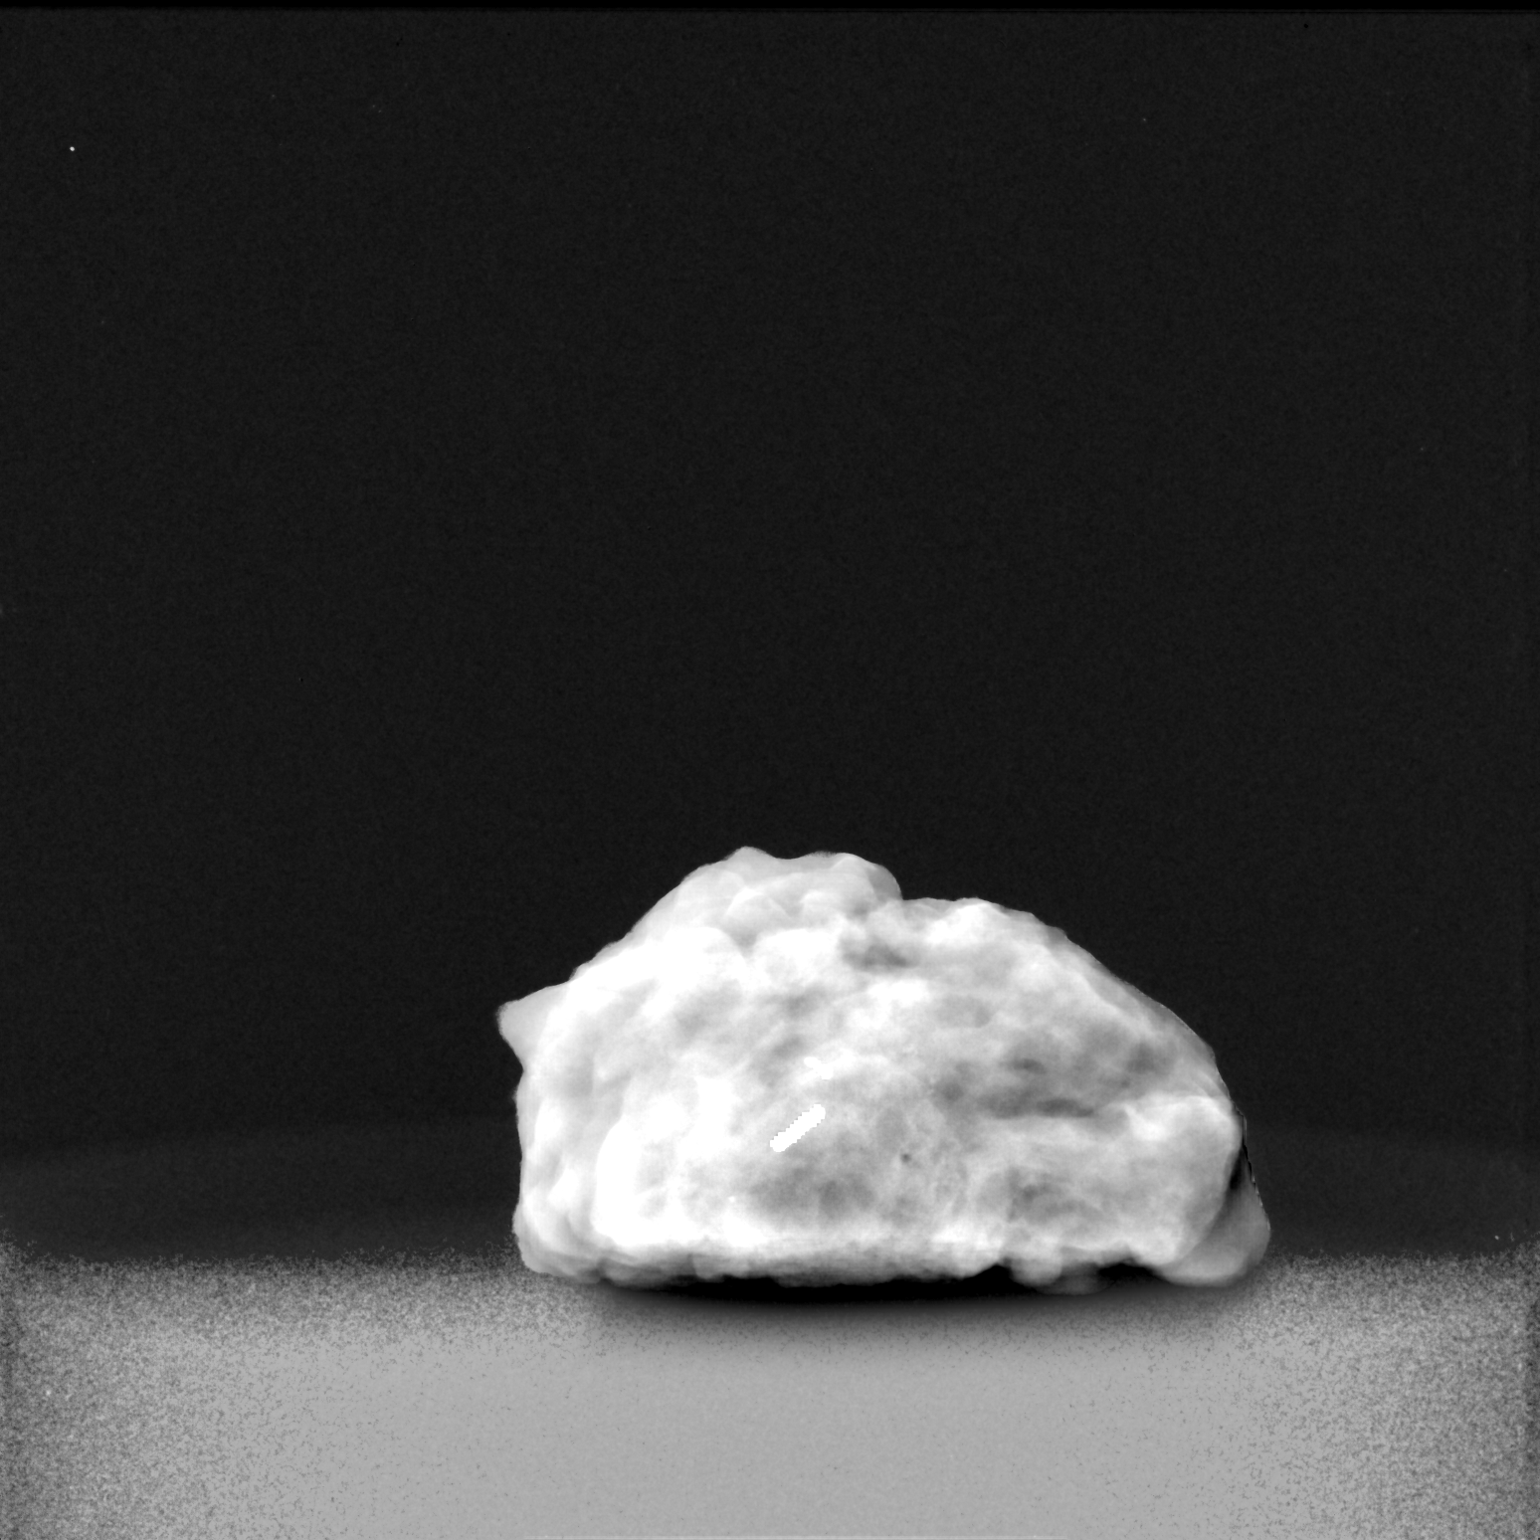
[im 2/2]
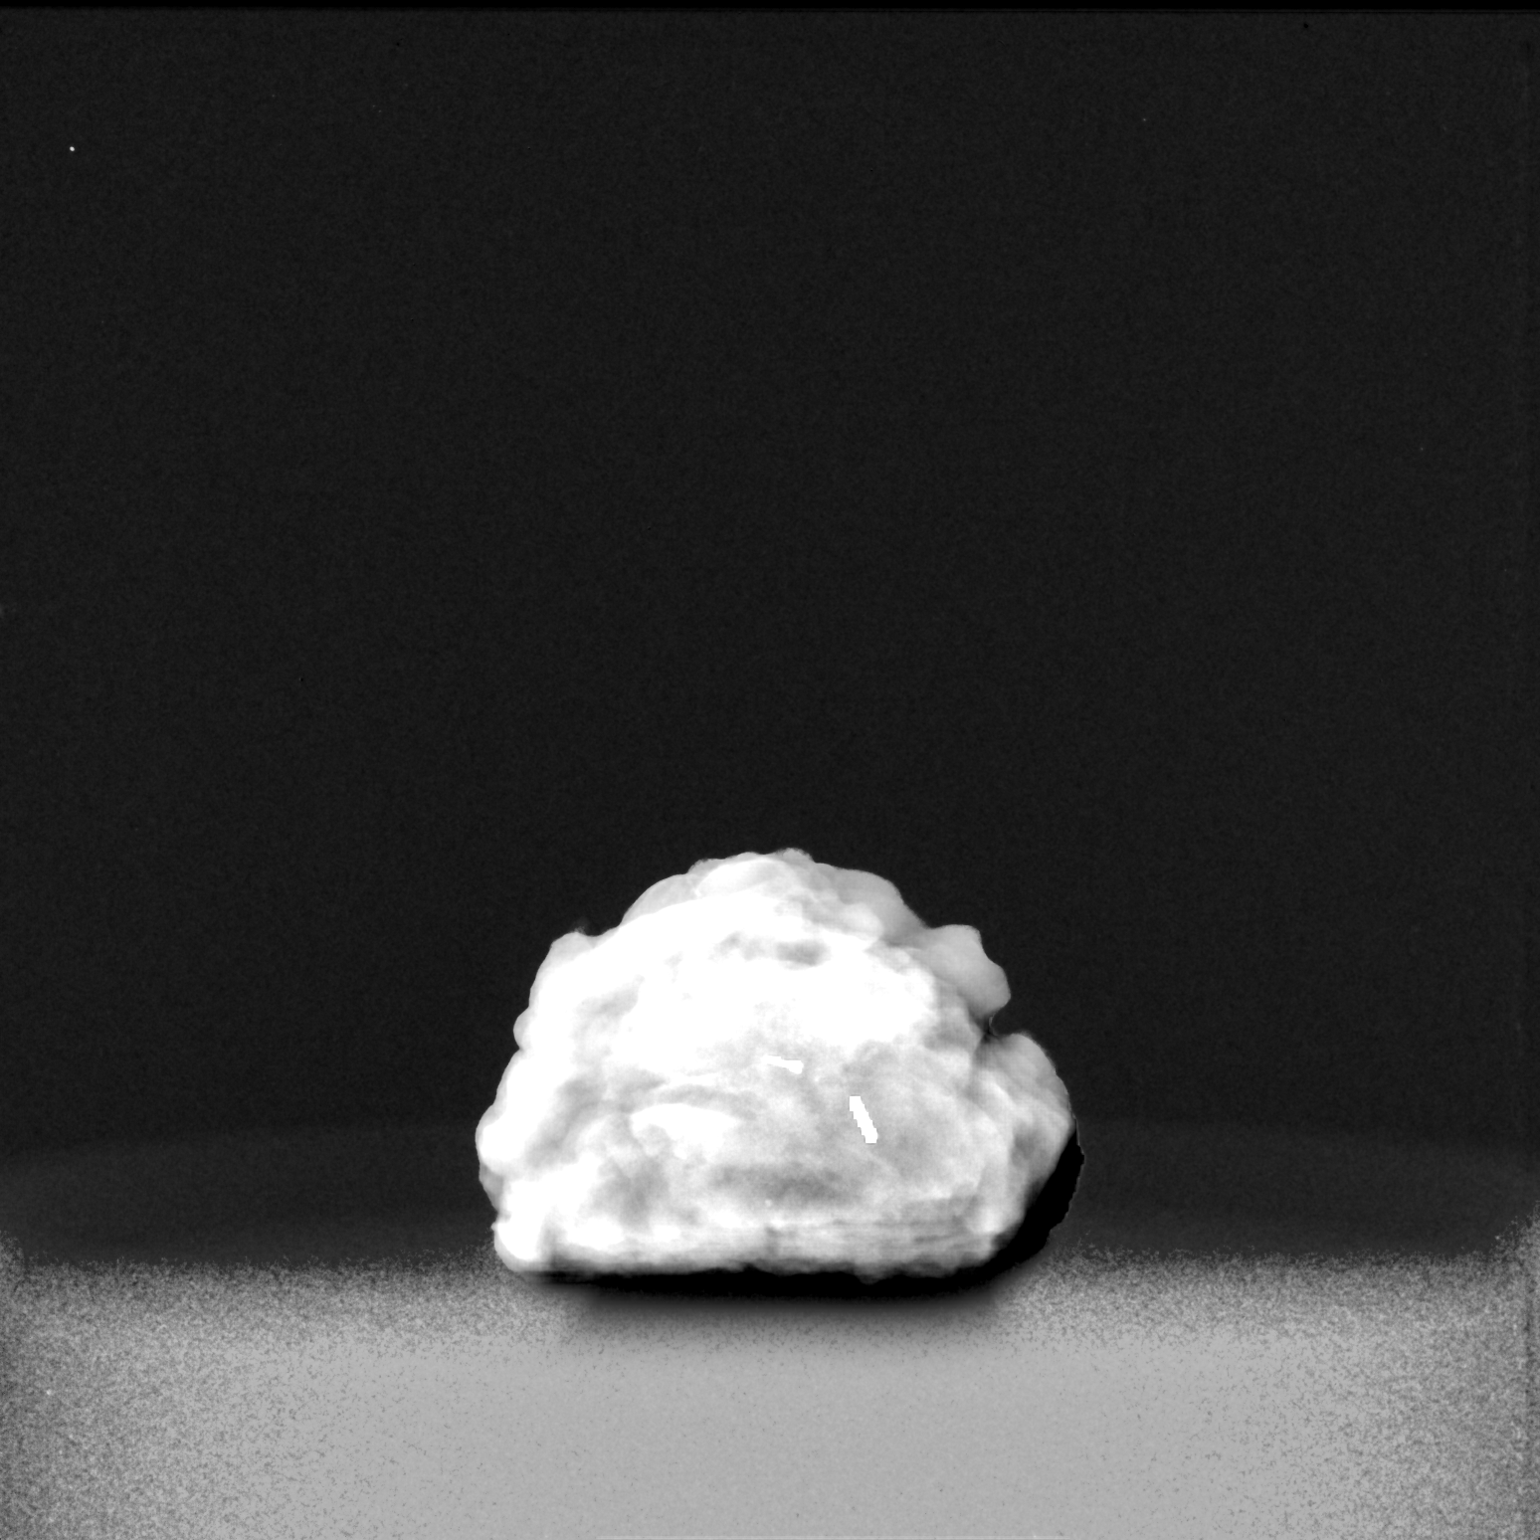

[2 of 2 positions shown; findings below may reference images not displayed]

FINDINGS: Status post excision of the right breast. The radioactive seed and
biopsy marker clip are present, completely intact, and were marked
for pathology.
IMPRESSION: Specimen radiograph of the right breast.

## 2019-06-30 SURGERY — BREAST LUMPECTOMY WITH RADIOACTIVE SEED AND SENTINEL LYMPH NODE BIOPSY
Anesthesia: General | Site: Chest | Laterality: Right

## 2019-06-30 MED ORDER — MIDAZOLAM HCL 2 MG/2ML IJ SOLN
INTRAMUSCULAR | Status: AC
  Filled 2019-06-30: qty 2

## 2019-06-30 MED ORDER — BUPIVACAINE-EPINEPHRINE (PF) 0.5% -1:200000 IJ SOLN
INTRAMUSCULAR | Status: DC | PRN
Start: 2019-06-30 — End: 2019-06-30
  Administered 2019-06-30: 30 mL

## 2019-06-30 MED ORDER — FENTANYL CITRATE (PF) 100 MCG/2ML IJ SOLN
INTRAMUSCULAR | Status: AC
  Filled 2019-06-30: qty 2

## 2019-06-30 MED ORDER — GABAPENTIN 100 MG PO CAPS
ORAL_CAPSULE | ORAL | Status: AC
  Filled 2019-06-30: qty 1

## 2019-06-30 MED ORDER — HEPARIN SOD (PORK) LOCK FLUSH 100 UNIT/ML IV SOLN
INTRAVENOUS | Status: DC | PRN
  Administered 2019-06-30: 500 [IU] via INTRAVENOUS

## 2019-06-30 MED ORDER — GABAPENTIN 100 MG PO CAPS
100.0000 mg | ORAL_CAPSULE | ORAL | Status: AC
  Administered 2019-06-30: 100 mg via ORAL

## 2019-06-30 MED ORDER — PROPOFOL 10 MG/ML IV BOLUS
INTRAVENOUS | Status: DC | PRN
  Administered 2019-06-30: 200 mg via INTRAVENOUS

## 2019-06-30 MED ORDER — OXYCODONE HCL 5 MG PO TABS: 5.0000 mg | ORAL_TABLET | Freq: Four times a day (QID) | ORAL | 0 refills | Status: DC | PRN

## 2019-06-30 MED ORDER — LACTATED RINGERS IV SOLN: INTRAVENOUS | Status: DC

## 2019-06-30 MED ORDER — TECHNETIUM TC 99M SULFUR COLLOID FILTERED
1.0000 | Freq: Once | INTRAVENOUS | Status: AC | PRN
  Administered 2019-06-30: 1 via INTRADERMAL

## 2019-06-30 MED ORDER — ONDANSETRON HCL 4 MG/2ML IJ SOLN
INTRAMUSCULAR | Status: AC
  Filled 2019-06-30: qty 2

## 2019-06-30 MED ORDER — LIDOCAINE HCL (CARDIAC) PF 100 MG/5ML IV SOSY
PREFILLED_SYRINGE | INTRAVENOUS | Status: DC | PRN
  Administered 2019-06-30: 20 mg via INTRAVENOUS

## 2019-06-30 MED ORDER — PROPOFOL 10 MG/ML IV BOLUS
INTRAVENOUS | Status: AC
  Filled 2019-06-30: qty 20

## 2019-06-30 MED ORDER — ONDANSETRON HCL 4 MG/2ML IJ SOLN
INTRAMUSCULAR | Status: DC | PRN
  Administered 2019-06-30: 4 mg via INTRAVENOUS

## 2019-06-30 MED ORDER — FENTANYL CITRATE (PF) 100 MCG/2ML IJ SOLN
50.0000 ug | INTRAMUSCULAR | Status: AC | PRN
  Administered 2019-06-30: 50 ug via INTRAVENOUS
  Administered 2019-06-30 (×2): 100 ug via INTRAVENOUS

## 2019-06-30 MED ORDER — MIDAZOLAM HCL 2 MG/2ML IJ SOLN
1.0000 mg | INTRAMUSCULAR | Status: DC | PRN
  Administered 2019-06-30: 2 mg via INTRAVENOUS

## 2019-06-30 MED ORDER — HEPARIN SOD (PORK) LOCK FLUSH 100 UNIT/ML IV SOLN
INTRAVENOUS | Status: AC
  Filled 2019-06-30: qty 5

## 2019-06-30 MED ORDER — ACETAMINOPHEN 500 MG PO TABS
1000.0000 mg | ORAL_TABLET | ORAL | Status: AC
  Administered 2019-06-30: 1000 mg via ORAL

## 2019-06-30 MED ORDER — KETOROLAC TROMETHAMINE 15 MG/ML IJ SOLN
15.0000 mg | INTRAMUSCULAR | Status: AC
  Administered 2019-06-30: 15 mg via INTRAVENOUS

## 2019-06-30 MED ORDER — CEFAZOLIN SODIUM-DEXTROSE 2-4 GM/100ML-% IV SOLN
2.0000 g | INTRAVENOUS | Status: AC
  Administered 2019-06-30: 2 g via INTRAVENOUS

## 2019-06-30 MED ORDER — DEXAMETHASONE SODIUM PHOSPHATE 4 MG/ML IJ SOLN
INTRAMUSCULAR | Status: DC | PRN
  Administered 2019-06-30: 4 mg via INTRAVENOUS

## 2019-06-30 MED ORDER — DEXAMETHASONE SODIUM PHOSPHATE 10 MG/ML IJ SOLN
INTRAMUSCULAR | Status: AC
  Filled 2019-06-30: qty 1

## 2019-06-30 MED ORDER — HEPARIN (PORCINE) IN NACL 1000-0.9 UT/500ML-% IV SOLN
INTRAVENOUS | Status: AC
  Filled 2019-06-30: qty 500

## 2019-06-30 MED ORDER — EPHEDRINE SULFATE 50 MG/ML IJ SOLN
INTRAMUSCULAR | Status: DC | PRN
Start: 2019-06-30 — End: 2019-06-30
  Administered 2019-06-30 (×2): 10 mg via INTRAVENOUS

## 2019-06-30 MED ORDER — BUPIVACAINE HCL (PF) 0.25 % IJ SOLN
INTRAMUSCULAR | Status: DC | PRN
  Administered 2019-06-30: 14 mL

## 2019-06-30 MED ORDER — PROPOFOL 500 MG/50ML IV EMUL
INTRAVENOUS | Status: AC
  Filled 2019-06-30: qty 50

## 2019-06-30 MED ORDER — KETOROLAC TROMETHAMINE 15 MG/ML IJ SOLN
INTRAMUSCULAR | Status: AC
  Filled 2019-06-30: qty 1

## 2019-06-30 MED ORDER — CEFAZOLIN SODIUM-DEXTROSE 2-4 GM/100ML-% IV SOLN
INTRAVENOUS | Status: AC
  Filled 2019-06-30: qty 100

## 2019-06-30 MED ORDER — HEPARIN (PORCINE) IN NACL 2-0.9 UNITS/ML
INTRAMUSCULAR | Status: AC | PRN
  Administered 2019-06-30: 1

## 2019-06-30 MED ORDER — ACETAMINOPHEN 500 MG PO TABS
ORAL_TABLET | ORAL | Status: AC
  Filled 2019-06-30: qty 2

## 2019-06-30 SURGICAL SUPPLY — 58 items
APPLIER CLIP 9.375 MED OPEN (MISCELLANEOUS) ×3
BAG DECANTER FOR FLEXI CONT (MISCELLANEOUS) ×3 IMPLANT
BENZOIN TINCTURE PRP APPL 2/3 (GAUZE/BANDAGES/DRESSINGS) ×3 IMPLANT
BINDER BREAST XLRG (GAUZE/BANDAGES/DRESSINGS) ×3 IMPLANT
BLADE SURG 11 STRL SS (BLADE) ×3 IMPLANT
BLADE SURG 15 STRL LF DISP TIS (BLADE) ×2 IMPLANT
BLADE SURG 15 STRL SS (BLADE) ×1
CANISTER SUCT 1200ML W/VALVE (MISCELLANEOUS) ×3 IMPLANT
CHLORAPREP W/TINT 26 (MISCELLANEOUS) ×3 IMPLANT
CLIP APPLIE 9.375 MED OPEN (MISCELLANEOUS) ×2 IMPLANT
CLIP VESOCCLUDE SM WIDE 6/CT (CLIP) ×3 IMPLANT
COVER BACK TABLE 60X90IN (DRAPES) ×3 IMPLANT
COVER MAYO STAND STRL (DRAPES) ×3 IMPLANT
COVER PROBE 5X48 (MISCELLANEOUS) ×1
COVER PROBE W GEL 5X96 (DRAPES) ×3 IMPLANT
DERMABOND ADVANCED (GAUZE/BANDAGES/DRESSINGS) ×1
DERMABOND ADVANCED .7 DNX12 (GAUZE/BANDAGES/DRESSINGS) ×2 IMPLANT
DRAPE C-ARM 42X72 X-RAY (DRAPES) ×3 IMPLANT
DRAPE LAPAROSCOPIC ABDOMINAL (DRAPES) ×3 IMPLANT
DRAPE UTILITY XL STRL (DRAPES) ×3 IMPLANT
ELECT COATED BLADE 2.86 ST (ELECTRODE) ×3 IMPLANT
ELECT REM PT RETURN 9FT ADLT (ELECTROSURGICAL) ×3
ELECTRODE REM PT RTRN 9FT ADLT (ELECTROSURGICAL) ×2 IMPLANT
GAUZE SPONGE 4X4 12PLY STRL LF (GAUZE/BANDAGES/DRESSINGS) ×3 IMPLANT
GLOVE BIO SURGEON STRL SZ 6.5 (GLOVE) ×3 IMPLANT
GLOVE BIO SURGEON STRL SZ7 (GLOVE) ×6 IMPLANT
GLOVE BIOGEL PI IND STRL 7.0 (GLOVE) ×2 IMPLANT
GLOVE BIOGEL PI IND STRL 7.5 (GLOVE) ×2 IMPLANT
GLOVE BIOGEL PI INDICATOR 7.0 (GLOVE) ×1
GLOVE BIOGEL PI INDICATOR 7.5 (GLOVE) ×1
GLOVE SURG SS PI 7.0 STRL IVOR (GLOVE) ×3 IMPLANT
GOWN STRL REUS W/ TWL LRG LVL3 (GOWN DISPOSABLE) ×6 IMPLANT
GOWN STRL REUS W/TWL LRG LVL3 (GOWN DISPOSABLE) ×3
KIT CVR 48X5XPRB PLUP LF (MISCELLANEOUS) ×2 IMPLANT
KIT MARKER MARGIN INK (KITS) ×3 IMPLANT
KIT PORT POWER 8FR ISP CVUE (Port) ×3 IMPLANT
NEEDLE HYPO 25X1 1.5 SAFETY (NEEDLE) ×3 IMPLANT
NS IRRIG 1000ML POUR BTL (IV SOLUTION) ×3 IMPLANT
PENCIL SMOKE EVACUATOR (MISCELLANEOUS) ×3 IMPLANT
RETRACTOR ONETRAX LX 90X20 (MISCELLANEOUS) ×3 IMPLANT
SET BASIN DAY SURGERY F.S. (CUSTOM PROCEDURE TRAY) ×3 IMPLANT
SLEEVE SCD COMPRESS KNEE MED (MISCELLANEOUS) ×3 IMPLANT
SPONGE LAP 4X18 RFD (DISPOSABLE) ×3 IMPLANT
STRIP CLOSURE SKIN 1/2X4 (GAUZE/BANDAGES/DRESSINGS) ×3 IMPLANT
SUT MNCRL AB 4-0 PS2 18 (SUTURE) ×6 IMPLANT
SUT MON AB 5-0 PS2 18 (SUTURE) ×3 IMPLANT
SUT PROLENE 2 0 SH DA (SUTURE) ×3 IMPLANT
SUT SILK 2 0 SH (SUTURE) ×6 IMPLANT
SUT VIC AB 2-0 SH 27 (SUTURE) ×4
SUT VIC AB 2-0 SH 27XBRD (SUTURE) ×8 IMPLANT
SUT VIC AB 3-0 SH 27 (SUTURE) ×3
SUT VIC AB 3-0 SH 27X BRD (SUTURE) ×6 IMPLANT
SYR 5ML LUER SLIP (SYRINGE) ×3 IMPLANT
SYR CONTROL 10ML LL (SYRINGE) ×3 IMPLANT
TOWEL GREEN STERILE FF (TOWEL DISPOSABLE) ×3 IMPLANT
TRAY FAXITRON CT DISP (TRAY / TRAY PROCEDURE) ×3 IMPLANT
TUBE CONNECTING 20X1/4 (TUBING) ×3 IMPLANT
YANKAUER SUCT BULB TIP NO VENT (SUCTIONS) ×3 IMPLANT

## 2019-06-30 NOTE — Anesthesia Preprocedure Evaluation (Addendum)
Anesthesia Evaluation  Patient identified by MRN, date of birth, ID band Patient awake    Reviewed: Allergy & Precautions, NPO status , Patient's Chart, lab work & pertinent test results  History of Anesthesia Complications Negative for: history of anesthetic complications  Airway Mallampati: I  TM Distance: >3 FB Neck ROM: Full    Dental  (+) Dental Advisory Given   Pulmonary Current Smoker and Patient abstained from smoking.,  06/27/2019 SARS coronavirus NEG   breath sounds clear to auscultation       Cardiovascular negative cardio ROS   Rhythm:Regular Rate:Normal  06/27/2019 ECHO: EF 60-65%, valves normal   Neuro/Psych Depression negative neurological ROS     GI/Hepatic negative GI ROS, Neg liver ROS,   Endo/Other  negative endocrine ROS  Renal/GU negative Renal ROS     Musculoskeletal   Abdominal   Peds  Hematology negative hematology ROS (+)   Anesthesia Other Findings Breast cancer  Reproductive/Obstetrics                            Anesthesia Physical Anesthesia Plan  ASA: II  Anesthesia Plan: General   Post-op Pain Management: GA combined w/ Regional for post-op pain   Induction: Intravenous  PONV Risk Score and Plan: 2 and Ondansetron and Dexamethasone  Airway Management Planned: LMA  Additional Equipment: None  Intra-op Plan:   Post-operative Plan:   Informed Consent: I have reviewed the patients History and Physical, chart, labs and discussed the procedure including the risks, benefits and alternatives for the proposed anesthesia with the patient or authorized representative who has indicated his/her understanding and acceptance.     Dental advisory given  Plan Discussed with: CRNA and Surgeon  Anesthesia Plan Comments: (Plan GA with pectoralis block for post op analgesia)       Anesthesia Quick Evaluation

## 2019-06-30 NOTE — Transfer of Care (Signed)
Immediate Anesthesia Transfer of Care Note  Patient: Abigail Berg  Procedure(s) Performed: RIGHT BREAST LUMPECTOMY WITH RADIOACTIVE SEED AND RIGHT AXILLARY SENTINEL NODE BIOPSY (Right Breast) INSERTION PORT-A-CATH WITH ULTRASOUND GUIDANCE (Left Chest)  Patient Location: PACU  Anesthesia Type:GA combined with regional for post-op pain  Level of Consciousness: awake, alert  and oriented  Airway & Oxygen Therapy: Patient Spontanous Breathing and Patient connected to nasal cannula oxygen  Post-op Assessment: Report given to RN and Post -op Vital signs reviewed and stable  Post vital signs: Reviewed and stable  Last Vitals:  Vitals Value Taken Time  BP 136/66 06/30/19 1140  Temp    Pulse 77 06/30/19 1143  Resp 16 06/30/19 1143  SpO2 100 % 06/30/19 1143  Vitals shown include unvalidated device data.  Last Pain:  Vitals:   06/30/19 0834  TempSrc: Oral  PainSc: 0-No pain      Patients Stated Pain Goal: 3 (0000000 [id number])  Complications: No apparent anesthesia complications

## 2019-06-30 NOTE — Discharge Instructions (Signed)
No Tylenol until 2:30 No Ibuprofen until 4:30  [place] Office Phone Number [phone]  POST OP INSTRUCTIONS Take 400 mg of ibuprofen every 8 hours or 650 mg tylenol every 6 hours for next 72 hours then as needed. Use ice several times daily also. Always review your discharge instruction sheet given to you by the facility where your surgery was performed.  IF YOU HAVE DISABILITY OR FAMILY LEAVE FORMS, YOU MUST BRING THEM TO THE OFFICE FOR PROCESSING.  DO NOT GIVE THEM TO YOUR DOCTOR.  1. A prescription for pain medication may be given to you upon discharge.  Take your pain medication as prescribed, if needed.  If narcotic pain medicine is not needed, then you may take acetaminophen (Tylenol), naprosyn (Alleve) or ibuprofen (Advil) as needed. 2. Take your usually prescribed medications unless otherwise directed 3. If you need a refill on your pain medication, please contact your pharmacy.  They will contact our office to request authorization.  Prescriptions will not be filled after 5pm or on week-ends. 4. You should eat very light the first 24 hours after surgery, such as soup, crackers, pudding, etc.  Resume your normal diet the day after surgery. 5. Most patients will experience some swelling and bruising in the breast.  Ice packs and a good support bra will help.  Wear the breast binder provided or a sports bra for 72 hours day and night.  After that wear a sports bra during the day until you return to the office. Swelling and bruising can take several days to resolve.  6. It is common to experience some constipation if taking pain medication after surgery.  Increasing fluid intake and taking a stool softener will usually help or prevent this problem from occurring.  A mild laxative (Milk of Magnesia or Miralax) should be taken according to package directions if there are no bowel movements after 48 hours. 7. Unless discharge instructions indicate otherwise, you may remove  your bandages 48 hours after surgery and you may shower at that time.  You may have steri-strips (small skin tapes) in place directly over the incision.  These strips should be left on the skin for 7-10 days and will come off on their own.  If your surgeon used skin glue on the incision, you may shower in 24 hours.  The glue will flake off over the next 2-3 weeks.  Any sutures or staples will be removed at the office during your follow-up visit. 8. ACTIVITIES:  You may resume regular daily activities (gradually increasing) beginning the next day.  Wearing a good support bra or sports bra minimizes pain and swelling.  You may have sexual intercourse when it is comfortable. a. You may drive when you no longer are taking prescription pain medication, you can comfortably wear a seatbelt, and you can safely maneuver your car and apply brakes. b. RETURN TO WORK:  ______________________________________________________________________________________ 9. You should see your doctor in the office for a follow-up appointment approximately two weeks after your surgery.  Your doctor's nurse will typically make your follow-up appointment when she calls you with your pathology report.  Expect your pathology report 3-4 business days after your surgery.  You may call to check if you do not hear from [place] after three days. 10. OTHER INSTRUCTIONS: _______________________________________________________________________________________________ _____________________________________________________________________________________________________________________________________ _____________________________________________________________________________________________________________________________________ _____________________________________________________________________________________________________________________________________  WHEN TO CALL DR WAKEFIELD: 1. Fever over 101.0 2. Nausea and/or vomiting. 3. Extreme  swelling or bruising. 4. Continued bleeding from incision. 5. Increased pain, redness, or drainage from  the incision.  The clinic staff is available to answer your questions during regular business hours.  Please don't hesitate to call and ask to speak to one of the nurses for clinical concerns.  If you have a medical emergency, go to the nearest emergency room or call 911.  A surgeon from [facility] is always on call at the hospital.  For further questions, please visit [email or web address]    Post Anesthesia Home Care Instructions  Activity: Get plenty of rest for the remainder of the day. A responsible individual must stay with you for 24 hours following the procedure.  For the next 24 hours, DO NOT: -Drive a car -Paediatric nurse -Drink alcoholic beverages -Take any medication unless instructed by your physician -Make any legal decisions or sign important papers.  Meals: Start with liquid foods such as gelatin or soup. Progress to regular foods as tolerated. Avoid greasy, spicy, heavy foods. If nausea and/or vomiting occur, drink only clear liquids until the nausea and/or vomiting subsides. Call your physician if vomiting continues.  Special Instructions/Symptoms: Your throat may feel dry or sore from the anesthesia or the breathing tube placed in your throat during surgery. If this causes discomfort, gargle with warm salt water. The discomfort should disappear within 24 hours.  If you had a scopolamine patch placed behind your ear for the management of post- operative nausea and/or vomiting:  1. The medication in the patch is effective for 72 hours, after which it should be removed.  Wrap patch in a tissue and discard in the trash. Wash hands thoroughly with soap and water. 2. You may remove the patch earlier than 72 hours if you experience unpleasant side effects which may include dry mouth, dizziness or visual disturbances. 3. Avoid touching the patch.  Wash your hands with soap and water after contact with the patch.

## 2019-06-30 NOTE — Progress Notes (Signed)
Assisted Dr. Annye Asa with right, ultrasound guided, pectoralis block. Side rails up, monitors on throughout procedure. See vital signs in flow sheet. Tolerated Procedure well.

## 2019-06-30 NOTE — Op Note (Signed)
Preoperative diagnosis:clinical stage I right breast cancer Postoperative diagnosis: saa Procedure:Leftt IJport placement withUSguidance Right breast seed guided lumpectomy Right deep axillary sentinel node biopsy Surgeon: Dr Serita Grammes EBL: minimal Anesthesia: general with pec block Complications none Drains none Specimens: 1. Right breast tissue marked with paint 2. Additional lateral, posterior and superior margins right breast 3. Deep axillary sentinel nodes highest count 304 Sponge and needle count correct times two dispo to recovery stable  Indications:54 yof from [facility] who was noted to have colon polyps on routine csc subsequently underwent colectomy then had more on another csc. 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 she then saw Dr Burr Medico and was started on tamoxifen. she has no mass or dc. has been on high risk screening. had an mri that showed a 14 mm mass in right breast with neg nodes. US shows a 1.8 cm mass as well. had biopsy in [place] that shows grade III IDC er pos, pr neg, her 2 neg and Ki 40%.have elected to proceed with breast conservation and port placement.   Procedure:After informed consent was obtained the patient was taken to the operating room. She was given antibiotics. She was injected with Tc99 in standard periareolar fashion and a pectoral block was performed.SCDs were placed. She was placed under general anesthesia without complication. She was prepped and draped in the standard sterile surgical fashion. A surgical timeout was then performed.  I first did the lumpectomy.  I was able to identify the seed in the central lateral breast.  I then infiltrated Marcaine around the areola.  I made a periareolar incision to hide the scar later.  I then used the neoprobe to guide excision of the radioactive seed with an attempt to gain a clear margin around the tumor.  Mammogram confirmed removal of the clip and the seed.  I  thought I was close to 3 margins and removed some additional margins after doing the 3D imaging.  I then placed clips in the cavity.  I then closed the cavity with 2-0 Vicryl suture.  The skin was closed with 3-0 Vicryl and 5-0 Monocryl.  Glue and Steri-Strips were applied.  I then identified the location of the sentinel node in the low axilla.  I infiltrated Marcaine and made an incision below the axillary hairline.  I carried this through the axillary fascia.  I then identified what appeared to be a couple of normal-appearing sentinel lymph nodes.  I excised these.  There was no background radioactivity.  I closed the axillary fascia with 2-0 Vicryl.  The skin was closed with 3-0 Vicryl for Monocryl.  Glue and Steri-Strips were applied.  I identified the internal jugular vein on theleft side with the ultrasound. I made a small nick in the skin. I accessed the internal jugular vein with the needle under ultrasound guidance. I passed the wire. The wire was confirmed to be in position with fluoroscopy.The wire was in the vein by ultrasound as well.I then infiltrated Marcaine below the clavicle on theleftside. Imade anincision and developed a subcutaneous pocket for the port. I then tunneled between the port site as well as the insertion site. I brought the line through this. I then placed the dilator under fluoroscopic guidance over the wire. I removedthe wire andthe dilator. I then placed the line into the sheath. The sheath was then removed. I pulled the line back to be in the distal vena cava. I then hooked this up to the port. This was  placed in the pocket and sutured in place with 2-0 Prolene suture. I then closed this with 3-0 Vicryl and 4-0 Monocryl. Glue was placed.The port aspirated blood and flushed easily. I packed it with heparin. She tolerated well, was extubated and transferred to pacu stable.  

## 2019-06-30 NOTE — Anesthesia Procedure Notes (Addendum)
Anesthesia Regional Block: Pectoralis block   Pre-Anesthetic Checklist: ,, timeout performed, Correct Patient, Correct Site, Correct Laterality, Correct Procedure, Correct Position, site marked, Risks and benefits discussed,  Surgical consent,  Pre-op evaluation,  At surgeon's request and post-op pain management  Laterality: Right  Prep: chloraprep       Needles:  Injection technique: Single-shot  Needle Type: Echogenic Needle     Needle Length: 9cm  Needle Gauge: 21     Additional Needles:   Procedures:,,,, ultrasound used (permanent image in chart),,,,  Narrative:  Start time: 06/30/2019 9:17 AM End time: 06/30/2019 9:23 AM Injection made incrementally with aspirations every 5 mL.  Performed by: Personally  Anesthesiologist: Annye Asa, MD  Additional Notes: Pt identified in Holding room.  Monitors applied. Working IV access confirmed. Sterile prep R clavicle and pec.  #21ga ECHOgenic block needle between pecs, and then between pec and serratus, between ribs 4,5 with US guidance.  30cc 0.5% Bupivacaine with 1:200k epi injected incrementally after negative test dose.  Patient asymptomatic, VSS, no heme aspirated, tolerated well.  Jenita Seashore, MD

## 2019-06-30 NOTE — Progress Notes (Signed)
Emotional support provided through nuclear medicine breast injections. Vital signs stable. Patient tolerated well.  

## 2019-06-30 NOTE — H&P (Signed)
. 54 yof from [facility] who was noted to have colon polyps on routine csc subsequently underwent colectomy then had more on another csc. 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 she then saw Dr Burr Medico and was started on tamoxifen. she has no mass or dc. has been on high risk screening. had an mri that showed a 14 mm mass in right breast with neg nodes. US shows a 1.8 cm mass as well. had biopsy in [place] that shows grade III IDC er pos, pr neg, her 2 neg and Ki 40%. she is here with her friend today to discuss options   Past Surgical History Illene Regulus, CMA; 06/17/2019 3:22 PM) Breast Biopsy  Right. multiple Breast Mass; Local Excision  Right. Colon Polyp Removal - Colonoscopy  Oral Surgery  Resection of Small Bowel  Tonsillectomy   Diagnostic Studies History Lars Mage Spillers, CMA; 06/17/2019 3:22 PM) Colonoscopy  within last year Mammogram  within last year  Allergies Illene Regulus, CMA; 06/17/2019 3:23 PM) No Known Drug Allergies  [06/17/2019]:  Medication History (Alisha Spillers, CMA; 06/17/2019 3:23 PM) Tamoxifen Citrate ('20MG'$  Tablet, Oral) Active. Escitalopram Oxalate ('20MG'$  Tablet, Oral) Active. Medications Reconciled  Social History Illene Regulus, CMA; 06/17/2019 3:22 PM) Caffeine use  Carbonated beverages, Coffee, Tea. No alcohol use  No drug use  Tobacco use  Current every day smoker.  Family History Illene Regulus, [place]; 06/17/2019 3:22 PM) Arthritis  Mother. Breast Cancer  Family Members In General. Colon Cancer  Family Members In General. Hypertension  Mother. Migraine Headache  Mother.  Pregnancy / Birth History Illene Regulus, CMA; 06/17/2019 3:22 PM) Age at menarche  54 years. Gravida  1 Irregular periods  Maternal age  65-20 Para  1  Other Problems Illene Regulus, CMA; 06/17/2019 3:22 PM) Anxiety Disorder  Breast Cancer  Hemorrhoids  Lump In Breast     Review of Systems (Alisha Spillers  CMA; 06/17/2019 3:22 PM) General Not Present- Appetite Loss, Chills, Fatigue, Fever, Night Sweats, Weight Gain and Weight Loss. Skin Not Present- Change in Wart/Mole, Dryness, Hives, Jaundice, New Lesions, Non-Healing Wounds, Rash and Ulcer. HEENT Not Present- Earache, Hearing Loss, Hoarseness, Nose Bleed, Oral Ulcers, Ringing in the Ears, Seasonal Allergies, Sinus Pain, Sore Throat, Visual Disturbances, Wears glasses/contact lenses and Yellow Eyes. Breast Present- Breast Mass. Not Present- Breast Pain, Nipple Discharge and Skin Changes. Cardiovascular Not Present- Chest Pain, Difficulty Breathing Lying Down, Leg Cramps, Palpitations, Rapid Heart Rate, Shortness of Breath and Swelling of Extremities. Gastrointestinal Not Present- Abdominal Pain, Bloating, Bloody Stool, Change in Bowel Habits, Chronic diarrhea, Constipation, Difficulty Swallowing, Excessive gas, Gets full quickly at meals, Hemorrhoids, Indigestion, Nausea, Rectal Pain and Vomiting. Female Genitourinary Not Present- Frequency, Nocturia, Painful Urination, Pelvic Pain and Urgency. Musculoskeletal Not Present- Back Pain, Joint Pain, Joint Stiffness, Muscle Pain, Muscle Weakness and Swelling of Extremities. Neurological Not Present- Decreased Memory, Fainting, Headaches, Numbness, Seizures, Tingling, Tremor, Trouble walking and Weakness. Endocrine Not Present- Cold Intolerance, Excessive Hunger, Hair Changes, Heat Intolerance, Hot flashes and New Diabetes. Hematology Not Present- Blood Thinners, Easy Bruising, Excessive bleeding, Gland problems, HIV and Persistent Infections.  Vitals (Alisha Spillers CMA; 06/17/2019 3:23 PM) 06/17/2019 3:23 PM Weight: 178 lb Height: 68in Body Surface Area: 1.95 m Body Mass Index: 27.06 kg/m  Pulse: 93 (Regular)  BP: 138/82(Sitting, Left Arm, Standard)       Physical Exam Rolm Bookbinder MD; 06/17/2019 4:13 PM) General Mental Status-Alert. Orientation-Oriented  X3.  Eye Sclera/Conjunctiva - Bilateral-No scleral icterus.  Breast Nipples-No Discharge.  Breast Lump-No Palpable Breast Mass.  Lymphatic Head & Neck  General Head & Neck Lymphatics: Bilateral - Description - Normal. Axillary  General Axillary Region: Bilateral - Description - Normal. 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 we did discuss that and she discussed with Dr Burr Medico. does not desire that. e discussed the staging and pathophysiology of breast cancer. We discussed all of the different options for treatment for breast cancer including surgery, chemotherapy, radiation therapy, Herceptin, and antiestrogen therapy. We discussed a sentinel lymph node biopsy as she does not appear to having lymph node involvement right now. We discussed the performance of that with injection of radioactive tracer. We discussed that there is a chance of having a positive node with a sentinel lymph node biopsy We discussed up to a 5% risk lifetime of chronic shoulder pain as well as lymphedema associated with a sentinel lymph node biopsy. We discussed the options for treatment of the breast cancer which included lumpectomy versus a mastectomy. We discussed the performance of the lumpectomy with radioactive seed placement. We discussed a 5-10% chance of a positive margin requiring reexcision in the operating room. We also discussed that she will need radiation therapy if she undergoes lumpectomy. . We discussed mastectomy and the postoperative care for that as well. Mastectomy can be followed by reconstruction. The decision for lumpectomy vs mastectomy has no impact on decision for chemotherapy. Most  mastectomy patients will not need radiation therapy. We discussed that there is no difference in her survival whether she undergoes lumpectomy with radiation therapy or antiestrogen therapy versus a mastectomy. There is also no real difference between her recurrence in the breast. I also discussed port placement

## 2019-06-30 NOTE — Anesthesia Postprocedure Evaluation (Signed)
Anesthesia Post Note  Patient: Riza Shillings  Procedure(s) Performed: RIGHT BREAST LUMPECTOMY WITH RADIOACTIVE SEED AND RIGHT AXILLARY SENTINEL NODE BIOPSY (Right Breast) INSERTION PORT-A-CATH WITH ULTRASOUND GUIDANCE (Left Chest)     Patient location during evaluation: Phase II Anesthesia Type: General Level of consciousness: awake Pain management: pain level controlled Vital Signs Assessment: post-procedure vital signs reviewed and stable Respiratory status: spontaneous breathing Cardiovascular status: stable Postop Assessment: no apparent nausea or vomiting Anesthetic complications: no    Last Vitals:  Vitals:   06/30/19 1215 06/30/19 1230  BP: 126/71   Pulse: 71 71  Resp: 12   Temp:  36.6 C  SpO2: 99%     Last Pain:  Vitals:   06/30/19 1140  TempSrc:   PainSc: 0-No pain   Pain Goal: Patients Stated Pain Goal: 3 (06/30/19 0834)                 Huston Foley

## 2019-06-30 NOTE — Anesthesia Procedure Notes (Signed)
Procedure Name: LMA Insertion Date/Time: 06/30/2019 9:55 AM Performed by: Maryella Shivers, CRNA Pre-anesthesia Checklist: Patient identified, Emergency Drugs available, Suction available and Patient being monitored Patient Re-evaluated:Patient Re-evaluated prior to induction Oxygen Delivery Method: Circle system utilized Preoxygenation: Pre-oxygenation with 100% oxygen Induction Type: IV induction Ventilation: Mask ventilation without difficulty LMA: LMA inserted LMA Size: 4.0 Number of attempts: 1 Airway Equipment and Method: Bite block Placement Confirmation: positive ETCO2 Tube secured with: Tape Dental Injury: Teeth and Oropharynx as per pre-operative assessment

## 2019-06-30 NOTE — Interval H&P Note (Signed)
History and Physical Interval Note:  06/30/2019 9:47 AM  Abigail Berg  has presented today for surgery, with the diagnosis of right breast cancer.  The various methods of treatment have been discussed with the patient and family. After consideration of risks, benefits and other options for treatment, the patient has consented to  Procedure(s) with comments: RIGHT BREAST LUMPECTOMY WITH RADIOACTIVE SEED AND RIGHT AXILLARY SENTINEL NODE BIOPSY (Right) - PEC BLOCK INSERTION PORT-A-CATH WITH ULTRASOUND GUIDANCE (N/A) as a surgical intervention.  The patient's history has been reviewed, patient examined, no change in status, stable for surgery.  I have reviewed the patient's chart and labs.  Questions were answered to the patient's satisfaction.     Rolm Bookbinder

## 2019-07-05 ENCOUNTER — Encounter: Payer: Self-pay | Admitting: *Deleted

## 2019-07-06 LAB — SURGICAL PATHOLOGY

## 2019-07-07 ENCOUNTER — Encounter: Payer: Self-pay | Admitting: *Deleted

## 2019-07-14 NOTE — Progress Notes (Addendum)
Abigail Berg   [phone] [phone]   Clinic Follow up Note   Patient Care Team: Patient, [name] as PCP - General (General Practice) Armbruster, Carlota Raspberry, MD as Consulting Physician (Gastroenterology) Eyvonne Mechanic as Counselor (Licensed Clinical Social Worker) Truitt Merle, MD as Consulting Physician (Hematology) Alla Feeling, NP as Nurse Practitioner (Nurse Practitioner) Mauro Kaufmann, RN as Oncology Nurse Navigator Rockwell Germany, RN as Oncology Nurse Navigator Rolm Bookbinder, MD as Consulting Physician (General Surgery)  Date of Service: 07/15/2019  CHIEF COMPLAINT: F/u of Right breast cancer, CHEK2 mutation  SUMMARY OF ONCOLOGIC HISTORY: Oncology History Overview Note  Cancer Staging Malignant neoplasm of upper-outer quadrant of right breast in female, estrogen receptor positive ([place]) Staging form: Breast, AJCC 8th Edition - Clinical stage from 06/01/2019: Stage IA (cT1c, cN0, cM0, G3, ER+, PR-, HER2+) - Signed by Truitt Merle, MD on 06/17/2019 - Pathologic stage from 06/30/2019: Stage IA (pT1c, pN0, cM0, G3, ER+, PR-, HER2+) - Signed by Truitt Merle, MD on 07/15/2019    Malignant neoplasm of upper-outer quadrant of right breast in female, estrogen receptor positive ([place])  10/2018 -  Anti-estrogen oral therapy   She was preventively started on Tamoxifen since 10/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 Held after 06/17/19 to proceed with her breast cancer surgery and chemo.    05/06/2019 Breast MRI   IMPRESSION Enhancing right breast mass suspicious for malignancy. Second look [place] if recommended.  No Suspicious enhancing left breast masses.    05/24/2019 Breast US   IMPRESSION 1.8cm irregular right breast mass within the 9:30 position 3.5cm from the nipple is highly suggestive of malignancy. An Ultrasound guided breast biopsy is recommended. The findings of the study and recommendation for biopsy were discussed with the patient directly.     06/01/2019 Cancer Staging   Staging form: Breast, AJCC 8th Edition - Clinical stage from 06/01/2019: Stage IA (cT1c, cN0, cM0, G3, ER+, PR-, HER2+) - Signed by Truitt Merle, MD on 06/17/2019   06/01/2019 Initial Biopsy   Breast Biopsy - Right Breast Core Bx DIAGNOSIS INFILTRATING DUCT CARCINOMA WITH MINOR DUCTAL CARCINOMA IN SITU COMPNENT OF RIGHT BREAST, CORE NEEDLE BIOPSY    06/01/2019 Receptors her2   ER: greater than 75% of tumor cells show moderate staining  PR: Negative  HER2: Positive 3+ Ki67: 40%   06/17/2019 Initial Diagnosis   Malignant neoplasm of upper-outer quadrant of right breast in female, estrogen receptor positive ([place])   06/30/2019 Surgery   RIGHT BREAST LUMPECTOMY WITH RADIOACTIVE SEED AND RIGHT AXILLARY SENTINEL NODE BIOPSY and PAC placement  by Dr Donne Hazel     06/30/2019 Pathology Results   FINAL MICROSCOPIC DIAGNOSIS:   A. BREAST, RIGHT, LUMPECTOMY:  - Invasive ductal carcinoma, grade 3, spanning 1.5 cm.  - Intermediate grade ductal carcinoma in situ.  - Invasive carcinoma is 0.1-0.2 from the final lateral margin (part E).  - Margins are negative for in situ carcinoma.  - Biopsy site.  - See oncology table.   B. LYMPH NODE, RIGHT AXILLARY, SENTINEL, BIOPSY:  - One of one lymph nodes negative for carcinoma (0/1).   C. LYMPH NODE, RIGHT AXILLARY, SENTINEL, BIOPSY:  - One of one lymph nodes negative for carcinoma (0/1).   D. LYMPH NODE, RIGHT AXILLARY, SENTINEL, BIOPSY:  - One of one lymph nodes negative for carcinoma (0/1).   E. BREAST, RIGHT ADDITIONAL LATERAL MARGIN, EXCISION:  - Invasive ductal carcinoma.  - Invasive carcinoma is 0.1-0.2 cm from the new lateral  margin.   F. BREAST, RIGHT ADDITIONAL POSTERIOR MARGIN, EXCISION:  - Fibrocystic change and usual ductal hyperplasia.  - No malignancy identified.   G. BREAST, RIGHT ADDITIONAL SUPERIOR MARGIN, EXCISION:  - Fibrocystic change and usual ductal hyperplasia.  - Incidental radial scar.  - No  malignancy identified.     PROGNOSTIC INDICATOR RESULTS:  The tumor cells are POSITIVE for Her2 (3+).  Estrogen Receptor:       POSITIVE, 85%, MODERATE STAINING  Progesterone Receptor:   NEGATIVE  Proliferation Marker Ki-67:   25%    06/30/2019 Cancer Staging   Staging form: Breast, AJCC 8th Edition - Pathologic stage from 06/30/2019: Stage IA (pT1c, pN0, cM0, G3, ER+, PR-, HER2+) - Signed by Truitt Merle, MD on 07/15/2019   07/28/2019 -  Chemotherapy   The patient had PACLitaxel-protein bound (ABRAXANE) chemo infusion 200 mg, 100 mg/m2 = 200 mg (original dose ), Intravenous,  Once, 0 of 3 cycles Dose modification: 100 mg/m2 (Cycle 1, Reason: Provider Judgment) PACLitaxel (TAXOL) 162 mg in sodium chloride 0.9 % 250 mL chemo infusion (</= '80mg'$ /m2), 80 mg/m2, Intravenous,  Once, 0 of 3 cycles trastuzumab-dkst (OGIVRI) 336 mg in sodium chloride 0.9 % 250 mL chemo infusion, 4 mg/kg, Intravenous,  Once, 0 of 16 cycles  for chemotherapy treatment.       CURRENT THERAPY:  Pending adjuvant chemotherapy with weekly Taxol and Herceptin  INTERVAL HISTORY:  Abigail Berg is here for a follow up post surgery. She presents to the clinic with her female friend.   Doing well after surgery, mild pain at axillary and port site.  She has good appetite and energy level, denies any fever, discharge from incision, or other complaints.  Review of system negative.  MEDICAL HISTORY:  Past Medical History:  Diagnosis Date  . Depression   . Family history of colon cancer   . Monoallelic mutation of CHEK2 gene in female patient   . Personal history of colonic polyps     SURGICAL HISTORY: Past Surgical History:  Procedure Laterality Date  . BREAST LUMPECTOMY WITH RADIOACTIVE SEED AND SENTINEL LYMPH NODE BIOPSY Right 06/30/2019   Procedure: RIGHT BREAST LUMPECTOMY WITH RADIOACTIVE SEED AND RIGHT AXILLARY SENTINEL NODE BIOPSY;  Surgeon: Rolm Bookbinder, MD;  Location: [facility];  Service:  General;  Laterality: Right;  PEC BLOCK  . BREAST SURGERY    . COLON SURGERY     2017  . COLONOSCOPY    . POLYPECTOMY    . PORTACATH PLACEMENT Left 06/30/2019   Procedure: INSERTION PORT-A-CATH WITH ULTRASOUND GUIDANCE;  Surgeon: Rolm Bookbinder, MD;  Location: [facility];  Service: General;  Laterality: Left;    I have reviewed the social history and family history with the patient and they are unchanged from previous note.  ALLERGIES:  has No Known Allergies.  MEDICATIONS:  Current Outpatient Medications  Medication Sig Dispense Refill  . escitalopram (LEXAPRO) 20 MG tablet Take 20 mg by mouth daily.    Marland Kitchen lidocaine-prilocaine (EMLA) cream Apply to affected area once 30 g 3  . ondansetron (ZOFRAN) 8 MG tablet Take 1 tablet (8 mg total) by mouth 2 (two) times daily as needed (Nausea or vomiting). 30 tablet 1  . oxyCODONE (OXY IR/ROXICODONE) 5 MG immediate release tablet Take 1 tablet (5 mg total) by mouth every 6 (six) hours as needed. 10 tablet 0  . prochlorperazine (COMPAZINE) 10 MG tablet Take 1 tablet (10 mg total) by mouth every 6 (six) hours as needed (Nausea or  vomiting). 30 tablet 1  . tamoxifen (NOLVADEX) 20 MG tablet Take 1 tablet (20 mg total) by mouth daily. 30 tablet 5   No current facility-administered medications for this visit.    PHYSICAL EXAMINATION: ECOG PERFORMANCE STATUS: 1 - Symptomatic but completely ambulatory  Vitals:   07/15/19 1508  BP: 125/69  Pulse: 78  Resp: 17  Temp: 97.7 F (36.5 C)  SpO2: 99%   Filed Weights   07/15/19 1508  Weight: 181 lb 11.2 oz (82.4 kg)    GENERAL:alert, no distress and comfortable SKIN: skin color, texture, turgor are normal, no rashes or significant lesions EYES: normal, Conjunctiva are pink and non-injected, sclera clear NECK: supple, thyroid normal size, non-tender, without nodularity LYMPH:  no palpable lymphadenopathy in the cervical, axillary  LUNGS: clear to auscultation and percussion with  normal breathing effort HEART: regular rate & rhythm and no murmurs and no lower extremity edema ABDOMEN:abdomen soft, non-tender and normal bowel sounds Musculoskeletal:no cyanosis of digits and no clubbing  NEURO: alert & oriented x 3 with fluent speech, no focal motor/sensory deficits Breasts: Breast inspection showed them to be symmetrical with no nipple discharge.  Incision in the right breast and axilla are healing well, no discharge or surrounding skin erythema.  Palpation of the breasts and axilla revealed no obvious mass that I could appreciate.  LABORATORY DATA:  I have reviewed the data as listed CBC Latest Ref Rng & Units 07/15/2019 06/17/2019 01/14/2019  WBC 4.0 - 10.5 K/uL 7.0 6.8 7.4  Hemoglobin 12.0 - 15.0 g/dL 14.8 15.1(H) 15.2(H)  Hematocrit 36 - 46 % 44.2 44.0 43.6  Platelets 150 - 400 K/uL 222 245 218     CMP Latest Ref Rng & Units 07/15/2019 06/17/2019 06/17/2019  Glucose 70 - 99 mg/dL 103(H) 113(H) -  BUN 6 - 20 mg/dL 14 13 -  Creatinine 0.44 - 1.00 mg/dL 0.89 0.88 -  Sodium 135 - 145 mmol/L 142 138 -  Potassium 3.5 - 5.1 mmol/L 4.4 3.7 -  Chloride 98 - 111 mmol/L 107 106 -  CO2 22 - 32 mmol/L 26 24 -  Calcium 8.9 - 10.3 mg/dL 10.9(H) 11.3(H) 10.9(H)  Total Protein 6.5 - 8.1 g/dL 6.9 7.0 -  Total Bilirubin 0.3 - 1.2 mg/dL 0.2(L) 0.2(L) -  Alkaline Phos 38 - 126 U/L 75 74 -  AST 15 - 41 U/L 15 14(L) -  ALT 0 - 44 U/L 12 9 -      RADIOGRAPHIC STUDIES: I have personally reviewed the radiological images as listed and agreed with the findings in the report. No results found.   ASSESSMENT & PLAN:  Abigail Berg is a 54 y.o. female with    1. Malignant neoplasm of upper-outer quadrant of right breast, Stage 1A, p(T1cN0M0), stage IA, ER+/PR-/HER2+, Grade 3.  -She was diagnosed in 05/2019. She underwent right breast lumpectomy with SLNB by Dr Donne Hazel on 06/30/19. We discussed her pathology which showed a 1.5 cm grade 3 invasive ductal carcinoma, ER positive, HER-2  positive (repeated), margins were all negative, lymph nodes were negative. -I reviewed her bone scan findings with her, which was negative for definitive bone metastasis. -We discussed that HER-2 positive breast cancer is more aggressive, and predicts high risk of recurrence.  Recommend chemo with weekly Taxol (or Abraxane) X12 or Docetaxel q3weeks with anti-HER2 Herceptin followed by maintenance Herceptin q3weeks to complete 1 year of treatment. -Patient agrees to have weekly Taxol as adjuvant chemotherapy.   -Her echocardiogram from Jun 24, 2019 showed  normal EF --Chemotherapy consent: Side effects including but does not not limited to, fatigue, nausea, vomiting, diarrhea, hair loss, neuropathy, fluid retention, renal and kidney dysfunction, neutropenic fever, needed for blood transfusion, bleeding, reversible cardiomyopathy, heart failure, were discussed with patient in great detail. She agrees to proceed. -The goal of therapy is curative -plan to start chemo on 6/24 -we discussed DigniCap, she is not very interested   2. 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 -Wepreviouslydiscussed that [place] 1100delC mutation is associated with 2-4 times high risk of breast cancer than general population, especially in people with strong family history of breast cancer (she only has 1 family member with breast cancer and 3 with colon cancer). Her estimated lifetime risk of breast cancer is probably around 25-35% -She will continue annual screening breast MRI in addition to mammogram, self breast exam, and physician breast exam twice a year.Her GYN will order for her to be done locally.  -We also discussed themoderately increasedrisk of colon cancer from CHEK2 mutation; she will f/u with Dr. Havery Moros for screening colonoscopies, last done 07/2018 with12polyps. I recommend to repeat in next 2 years.  -She started chemoprevention  withTamoxifen once daily in 10/2018. She has tolerated Tamoxifen moderately well but attributes recent Fatigue andmildnausea to Tamoxifen. Stopped on 06/17/19 to proceed with start of breast cancer treatment.    3. Health Maintenance, Smoking Cessation  -She willcontinue f/u with PCP, OB/Gyn for routine health maintenance and other age appropriate cancer screenings -Wepreviouslydiscussed maintaining a healthy lifestyle. -She has been smoking for 25 years. She is currently smoking 1/2ppd. She has mild elevated Hg and can negatively effect her overall health. I again reviewedsmoking cessation.   4. S/p ileocecectomy on 08/14/15 due to benign colon polyp invading appendix, history of recurrent colon polyps  5.H/o right breast fibroadenoma,S/p rightsurgical resection on 11/22/15   6. Hypercalcemia, likely primary hyperparathyroidism -She is not on Calcium or Vit D, but takes MVI  -Her PTH was within normal limits, but on the high end.  PTHrp was negative. This is likely primary hyperparathyroidism -We will refer her to endocrinology after she completes adjuvant chemotherapy -I recommend her to take a multivitamin which contains low-dose calcium   PLAN: -chemo class -start adjuvant Taxol (or Abraxane if insurance approves) and trastuzumab weekly x12 on 6/24 -f/u on 6/24  -cardiology referral  -will screening her for the neuropathy trial   No problem-specific Assessment & Plan notes found for this encounter.   Orders Placed This Encounter  Procedures  . Ambulatory referral to Cardiology    Referral Priority:   Routine    Referral Type:   Consultation    Referral Reason:   Specialty Services Required    Requested Specialty:   Cardiology    Number of Visits Requested:   1   All questions were answered. The patient knows to call the clinic with any problems, questions or concerns. No barriers to learning was detected. The total time spent in the appointment was 40  minutes.     Truitt Merle, MD 07/17/2019   I, Joslyn Devon, am acting as scribe for Truitt Merle, MD.   I have reviewed the above documentation for accuracy and completeness, and I agree with the above.

## 2019-07-15 ENCOUNTER — Other Ambulatory Visit: Payer: Self-pay

## 2019-07-15 ENCOUNTER — Inpatient Hospital Stay: Payer: BC Managed Care – PPO | Attending: Hematology

## 2019-07-15 ENCOUNTER — Inpatient Hospital Stay (HOSPITAL_BASED_OUTPATIENT_CLINIC_OR_DEPARTMENT_OTHER): Payer: BC Managed Care – PPO | Admitting: Hematology

## 2019-07-15 VITALS — BP 125/69 | HR 78 | Temp 97.7°F | Resp 17 | Ht 68.0 in | Wt 181.7 lb

## 2019-07-15 DIAGNOSIS — C50411 Malignant neoplasm of upper-outer quadrant of right female breast: Secondary | ICD-10-CM | POA: Diagnosis not present

## 2019-07-15 DIAGNOSIS — Z170 Estrogen receptor positive status [ER+]: Secondary | ICD-10-CM | POA: Insufficient documentation

## 2019-07-15 DIAGNOSIS — Z5111 Encounter for antineoplastic chemotherapy: Secondary | ICD-10-CM | POA: Diagnosis not present

## 2019-07-15 DIAGNOSIS — Z5112 Encounter for antineoplastic immunotherapy: Secondary | ICD-10-CM | POA: Diagnosis not present

## 2019-07-15 DIAGNOSIS — Z1501 Genetic susceptibility to malignant neoplasm of breast: Secondary | ICD-10-CM

## 2019-07-15 DIAGNOSIS — Z1502 Genetic susceptibility to malignant neoplasm of ovary: Secondary | ICD-10-CM

## 2019-07-15 LAB — CBC WITH DIFFERENTIAL (CANCER CENTER ONLY)
Abs Immature Granulocytes: 0.01 10*3/uL (ref 0.00–0.07)
Basophils Absolute: 0 10*3/uL (ref 0.0–0.1)
Basophils Relative: 1 %
Eosinophils Absolute: 0.2 10*3/uL (ref 0.0–0.5)
Eosinophils Relative: 3 %
HCT: 44.2 % (ref 36.0–46.0)
Hemoglobin: 14.8 g/dL (ref 12.0–15.0)
Immature Granulocytes: 0 %
Lymphocytes Relative: 49 %
Lymphs Abs: 3.5 10*3/uL (ref 0.7–4.0)
MCH: 31.4 pg (ref 26.0–34.0)
MCHC: 33.5 g/dL (ref 30.0–36.0)
MCV: 93.6 fL (ref 80.0–100.0)
Monocytes Absolute: 0.3 10*3/uL (ref 0.1–1.0)
Monocytes Relative: 4 %
Neutro Abs: 3 10*3/uL (ref 1.7–7.7)
Neutrophils Relative %: 43 %
Platelet Count: 222 10*3/uL (ref 150–400)
RBC: 4.72 MIL/uL (ref 3.87–5.11)
RDW: 13.4 % (ref 11.5–15.5)
WBC Count: 7 10*3/uL (ref 4.0–10.5)
nRBC: 0 % (ref 0.0–0.2)

## 2019-07-15 LAB — CMP (CANCER CENTER ONLY)
ALT: 12 U/L (ref 0–44)
AST: 15 U/L (ref 15–41)
Albumin: 3.8 g/dL (ref 3.5–5.0)
Alkaline Phosphatase: 75 U/L (ref 38–126)
Anion gap: 9 (ref 5–15)
BUN: 14 mg/dL (ref 6–20)
CO2: 26 mmol/L (ref 22–32)
Calcium: 10.9 mg/dL — ABNORMAL HIGH (ref 8.9–10.3)
Chloride: 107 mmol/L (ref 98–111)
Creatinine: 0.89 mg/dL (ref 0.44–1.00)
GFR, Est AFR Am: >60 mL/min (ref >60)
GFR, Estimated: >60 mL/min (ref >60)
Glucose, Bld: 103 mg/dL — ABNORMAL HIGH (ref 70–99)
Potassium: 4.4 mmol/L (ref 3.5–5.1)
Sodium: 142 mmol/L (ref 135–145)
Total Bilirubin: 0.2 mg/dL — ABNORMAL LOW (ref 0.3–1.2)
Total Protein: 6.9 g/dL (ref 6.5–8.1)

## 2019-07-15 NOTE — Research (Signed)
E9528 - STUDYING HOW CANCER TREATMENT AFFECTS THE NERVES IN YOUR HANDS AND FEET  DCP-001 USE OF A CLINICAL TRIAL SCREENING TOOL TO ADDRESS CANCER HEALTH DISPARITIES IN THE [employer] COMMUNITY ONCOLOGY RESEARCH PROGRAM (NCORP)   07/15/2019 16:00PM  [id number] DISCUSSION: Referral received from Dr. Burr Medico to speak with Abigail Berg regarding potential participation in the S1714 study. Met with Abigail Berg (who is accompanied by a female friend today) along with clinical research coordinator Abigail Berg in clinic exam room 21 for approximately 15 minutes. The [phone] consent and HIPAA forms were reviewed briefly, including voluntary participation, study purpose, blood draw requirements, questionnaires and assessment procedures, alternatives to participation, and potential risks and benefits. Once all questions were answered, two copies of the S1714 consent form (protocol version date 06/16/2019, [place] Active Date 07/11/2019), two copies of the HIPAA form (dated 04/03/17), and the [facility] Study brochure were provided, along with my business card with direct contact information. Abigail Berg is encouraged to call with any questions and she verbalizes understanding. The current plan is to begin chemotherapy in a few weeks; she is advised that she will need to return to the center (potentially) prior to her first cycle day to sign consent and that all baseline questionnaires and assessments will be completed the first day of treatment: she verbalizes understanding. Abigail Berg states she will call should she decide to proceed with study participation.  DCP-001 DISCUSSION: Following our conversation on S1714, a brief description of the DCP-001 study was also provided including the voluntary nature of participation, the project purpose, risks and benefits, and who will see the provided information. Two copies of the DCP-001 consent form (protocol version date 07/27/18, [place] Active Date 02/08/19) and the [place] form (dated  03/27/14) were provided and clipped separately from the S1714 paperwork for clarity. Abigail Berg denies questions about DCP participation at this time.   Both Abigail Berg and her friend are encouraged to review the provided materials carefully prior to making a decision for participation. Abigail Berg was thanked for her time and willingness to consider participation in either study.   Dionne Bucy. Sharlett Iles, BSN, RN, CIC 07/15/2019 4:46 PM

## 2019-07-17 ENCOUNTER — Encounter: Payer: Self-pay | Admitting: Hematology

## 2019-07-17 MED ORDER — PROCHLORPERAZINE MALEATE 10 MG PO TABS: 10.0000 mg | ORAL_TABLET | Freq: Four times a day (QID) | ORAL | 1 refills | Status: DC | PRN

## 2019-07-17 MED ORDER — LIDOCAINE-PRILOCAINE 2.5-2.5 % EX CREA: TOPICAL_CREAM | CUTANEOUS | 3 refills | Status: DC

## 2019-07-17 MED ORDER — ONDANSETRON HCL 8 MG PO TABS: 8.0000 mg | ORAL_TABLET | Freq: Two times a day (BID) | ORAL | 1 refills | Status: DC | PRN

## 2019-07-17 NOTE — Progress Notes (Signed)
START ON PATHWAY REGIMEN - Breast     Cycle 1: A cycle is 7 days:     Trastuzumab-xxxx      Paclitaxel    Cycles 2 through 12: A cycle is every 7 days:     Trastuzumab-xxxx      Paclitaxel    Cycles 13 through 25: A cycle is every 21 days:     Trastuzumab-xxxx   **Always confirm dose/schedule in your pharmacy ordering system**  Patient Characteristics: Postoperative without Neoadjuvant Therapy (Pathologic Staging), Invasive Disease, Adjuvant Therapy, HER2 Positive, ER Positive, Node Negative, pT2, pN0, Tumor Size ?  3 cm Therapeutic Status: Postoperative without Neoadjuvant Therapy (Pathologic Staging) AJCC Grade: G3 AJCC N Category: pN0 AJCC M Category: cM0 ER Status: Positive (+) AJCC 8 Stage Grouping: IA HER2 Status: Positive (+) Oncotype Dx Recurrence Score: Not Appropriate AJCC T Category: pT2 PR Status: Positive (+) Intent of Therapy: Curative Intent, Discussed with Patient

## 2019-07-18 ENCOUNTER — Telehealth: Payer: Self-pay | Admitting: Gastroenterology

## 2019-07-18 ENCOUNTER — Telehealth: Payer: Self-pay | Admitting: Hematology

## 2019-07-18 ENCOUNTER — Encounter: Payer: Self-pay | Admitting: *Deleted

## 2019-07-18 NOTE — Telephone Encounter (Signed)
Appts were scheduled per the 6/11 los.  Called pt to inform her of her scheduled appt dates and time.  Pt aware of appt dates and time,

## 2019-07-18 NOTE — Telephone Encounter (Signed)
Patient was diagnosed with breast cancer and about to begin her chemo tx.  She was instructed last Dec that she should come in to discuss a repeat colonoscopy earlier than the 3 year recall in the system as a result of the genetic testing.  Dr. Havery Moros.  She is asking does she need this appt right now or can it be safely postponed until she know how she will feel after her chemo initiation?

## 2019-07-18 NOTE — Telephone Encounter (Signed)
Pt stated that she was recently diagnosed with breast cancer and will start chemo.  Pt inquired whether she should keep appointment 08/19/19 or postpone.

## 2019-07-19 NOTE — Telephone Encounter (Signed)
Patient notified Appt cancelled.  She will call back and reschedule when she is available

## 2019-07-19 NOTE — Telephone Encounter (Signed)
I think that is fine, she can reschedule whenever is good for her. At earliest we would consider repeat colonoscopy in 2022, so no urgency to this visit. Thanks

## 2019-07-22 ENCOUNTER — Telehealth: Payer: Self-pay

## 2019-07-22 DIAGNOSIS — Z170 Estrogen receptor positive status [ER+]: Secondary | ICD-10-CM

## 2019-07-22 DIAGNOSIS — C50411 Malignant neoplasm of upper-outer quadrant of right female breast: Secondary | ICD-10-CM

## 2019-07-22 NOTE — Telephone Encounter (Signed)
M3014 - STUDYING HOW CANCER TREATMENT AFFECTS THE NERVES IN YOUR HANDS AND FEET  DCP-001 USE OF A CLINICAL TRIAL SCREENING TOOL TO ADDRESS CANCER HEALTH DISPARITIES IN THE [employer] COMMUNITY ONCOLOGY RESEARCH PROGRAM (NCORP)  07/22/2019 14:25PM  FOLLOW-UP CALL: Outgoing call as a follow-up from our 07/15/19 meeting to discuss voluntary S1714 and DCP participation. Verified the person I was speaking with was Abigail Berg: we spoke for approximately ten minutes. Abigail Berg that she is interested in participating in the S1714 study and potentially in DCP-001. States she has an appointment in [place] on Thursday, 07/28/19, and would like to meet with me after her existing appointment to discuss DCP further and sign consent for S1714 participation. Research consenting appointment has been scheduled for 12:00pm on Thursday, 07/28/19 as requested. Abigail Berg is instructed to arrive at the cancer center and inform the registration area that she has a research appointment: she verbalizes understanding. Eligibility for [phone] participation has been verified by myself and Clinical Research Nurse Abigail Berg. Current plan is to meet with Abigail Berg on Thursday, review and sign consent (if desired). Should Abigail Berg wish to participate, baseline [phone] activities will be completed on Friday (07/29/19) after labs are collected, following her provider appointment and before her first chemotherapy infusion. Abigail Berg forher time and willingness toconsider participation in either study and is encouraged to call with any questions: she verbalizes understanding.  Abigail Berg. Abigail Berg, BSN, RN, CIC 07/22/2019 3:26 PM

## 2019-07-25 ENCOUNTER — Inpatient Hospital Stay: Payer: BC Managed Care – PPO

## 2019-07-25 ENCOUNTER — Other Ambulatory Visit: Payer: Self-pay

## 2019-07-25 DIAGNOSIS — Z170 Estrogen receptor positive status [ER+]: Secondary | ICD-10-CM

## 2019-07-25 DIAGNOSIS — C50411 Malignant neoplasm of upper-outer quadrant of right female breast: Secondary | ICD-10-CM

## 2019-07-25 MED ORDER — LIDOCAINE HCL 2 % IJ SOLN
INTRAMUSCULAR | Status: AC
  Filled 2019-07-25: qty 20

## 2019-07-25 NOTE — Progress Notes (Signed)
Pharmacist Chemotherapy Monitoring - Initial Assessment    Anticipated start date: 07/29/2019   Regimen:  . Are orders appropriate based on the patient's diagnosis, regimen, and cycle? Yes . Does the plan date match the patient's scheduled date? Yes . Is the sequencing of drugs appropriate? Yes . Are the premedications appropriate for the patient's regimen? Yes . Prior Authorization for treatment is: Not Started o If applicable, is the correct biosimilar selected based on the patient's insurance? yes  Organ Function and Labs: Marland Kitchen Are dose adjustments needed based on the patient's renal function, hepatic function, or hematologic function? Yes . Are appropriate labs ordered prior to the start of patient's treatment? Yes . Other organ system assessment, if indicated: trastuzumab: Echo/ MUGA . The following baseline labs, if indicated, have been ordered: N/A  Dose Assessment: . Are the drug doses appropriate? Yes . Are the following correct: o Drug concentrations Yes o IV fluid compatible with drug Yes o Administration routes Yes o Timing of therapy Yes . If applicable, does the patient have documented access for treatment and/or plans for port-a-cath placement? yes . If applicable, have lifetime cumulative doses been properly documented and assessed? not applicable Lifetime Dose Tracking  No doses have been documented on this patient for the following tracked chemicals: Doxorubicin, Epirubicin, Idarubicin, Daunorubicin, Mitoxantrone, Bleomycin, Oxaliplatin, Carboplatin, Liposomal Doxorubicin  o   Toxicity Monitoring/Prevention: . The patient has the following take home antiemetics prescribed: Prochlorperazine . The patient has the following take home medications prescribed: N/A . Medication allergies and previous infusion related reactions, if applicable, have been reviewed and addressed. No . The patient's current medication list has been assessed for drug-drug interactions with their  chemotherapy regimen. no significant drug-drug interactions were identified on review.  Order Review: . Are the treatment plan orders signed? No . Is the patient scheduled to see a provider prior to their treatment? Yes  I verify that I have reviewed each item in the above checklist and answered each question accordingly.  Deshonda Cryderman D 07/25/2019 10:51 AM

## 2019-07-26 ENCOUNTER — Other Ambulatory Visit: Payer: Self-pay

## 2019-07-26 DIAGNOSIS — C50411 Malignant neoplasm of upper-outer quadrant of right female breast: Secondary | ICD-10-CM

## 2019-07-27 ENCOUNTER — Telehealth: Payer: Self-pay

## 2019-07-27 DIAGNOSIS — C50411 Malignant neoplasm of upper-outer quadrant of right female breast: Secondary | ICD-10-CM

## 2019-07-27 NOTE — Telephone Encounter (Signed)
R4163 - STUDYING HOW CANCER TREATMENT AFFECTS THE NERVES IN YOUR HANDS AND FEET  DCP-001 USE OF A CLINICAL TRIAL SCREENING TOOL TO ADDRESS CANCER HEALTH DISPARITIES IN THE [employer] COMMUNITY ONCOLOGY RESEARCH PROGRAM ([facility])  07/27/2019 15:25PM  OUTGOING REMINDER CALL: Verified that I was speaking with was Abigail Berg: we spoke for less than five minutes. This is an outgoing call as a reminder of our upcoming consent visit for [id number] and possibly DCP-001, scheduled for tomorrow, 07/28/10, around noon or so, following her lymphedema appointment. Abigail Berg continued interest in participating and states she will come to the cancer center following her appointment. Susanwasthanked forher time and willingness toconsiderparticipation in either study and is encouraged to call with any questions: she verbalizes understanding. Current plan is to meet tomorrow as scheduled with baseline neuro assessments to occur prior to her first taxane treatment on Friday, 07/29/19.  Abigail Berg. Abigail Berg, BSN, RN, CIC 07/27/2019 3:39 PM

## 2019-07-28 ENCOUNTER — Other Ambulatory Visit: Payer: Self-pay

## 2019-07-28 ENCOUNTER — Encounter: Payer: Self-pay | Admitting: Physical Therapy

## 2019-07-28 ENCOUNTER — Inpatient Hospital Stay: Payer: BC Managed Care – PPO

## 2019-07-28 ENCOUNTER — Ambulatory Visit: Payer: BC Managed Care – PPO | Attending: General Surgery | Admitting: Physical Therapy

## 2019-07-28 ENCOUNTER — Telehealth: Payer: Self-pay | Admitting: *Deleted

## 2019-07-28 ENCOUNTER — Inpatient Hospital Stay (HOSPITAL_BASED_OUTPATIENT_CLINIC_OR_DEPARTMENT_OTHER): Payer: BC Managed Care – PPO | Admitting: Medical

## 2019-07-28 ENCOUNTER — Encounter: Payer: Self-pay | Admitting: Radiology

## 2019-07-28 ENCOUNTER — Encounter: Payer: Self-pay | Admitting: Nurse Practitioner

## 2019-07-28 VITALS — BP 138/85 | HR 77 | Temp 98.9°F | Resp 18 | Ht 68.0 in | Wt 180.3 lb

## 2019-07-28 DIAGNOSIS — R293 Abnormal posture: Secondary | ICD-10-CM | POA: Diagnosis not present

## 2019-07-28 DIAGNOSIS — C50411 Malignant neoplasm of upper-outer quadrant of right female breast: Secondary | ICD-10-CM

## 2019-07-28 DIAGNOSIS — Z170 Estrogen receptor positive status [ER+]: Secondary | ICD-10-CM | POA: Diagnosis not present

## 2019-07-28 NOTE — Patient Instructions (Signed)
Shoulder: Flexion (Supine)    With hands shoulder width apart, slowly lower dowel to floor behind head. Do not let elbows bend. Keep back flat. Hold _10-30___ seconds. Repeat __10__ times. Do _1___ sessions per day. CAUTION: Stretch slowly and gently.  Copyright  VHI. All rights reserved.  Shoulder: Abduction (Supine)    With right arm flat on floor, hold dowel in palm. Slowly move arm up to side of head by pushing with opposite arm. Do not let elbow bend. Hold _10-30___ seconds. Repeat _10___ times. Do __1__ sessions per day. CAUTION: Stretch slowly and gently.  Copyright  VHI. All rights reserved.

## 2019-07-28 NOTE — Telephone Encounter (Signed)
VM received from [place], PT about patient concerns regarding an abdominal lump.  Pain increases with eating.  Has appointment to sign papers for research today.  RN will send scheduling message for evaluation appointment with [name] today following research appointment.

## 2019-07-28 NOTE — Therapy (Addendum)
[place], [place], [address] Phone: [phone]   Fax:  [phone]  Physical Therapy Treatment  Patient Details  Name: Abigail Berg MRN: [id number] Date of Birth: 06-09-1965 Referring Provider (PT): Donne Hazel   Encounter Date: 07/28/2019   PT End of Session - 07/28/19 1200    Visit Number 2    Number of Visits 2    Date for PT Re-Evaluation 07/26/19    PT Start Time 1102    PT Stop Time 1145    PT Time Calculation (min) 43 min    Activity Tolerance Patient tolerated treatment well    Behavior During Therapy [facility] for tasks assessed/performed           Past Medical History:  Diagnosis Date  . Depression   . Family history of colon cancer   . Monoallelic mutation of CHEK2 gene in female patient   . Personal history of colonic polyps     Past Surgical History:  Procedure Laterality Date  . BREAST LUMPECTOMY WITH RADIOACTIVE SEED AND SENTINEL LYMPH NODE BIOPSY Right 06/30/2019   Procedure: RIGHT BREAST LUMPECTOMY WITH RADIOACTIVE SEED AND RIGHT AXILLARY SENTINEL NODE BIOPSY;  Surgeon: Rolm Bookbinder, MD;  Location: [facility];  Service: General;  Laterality: Right;  PEC BLOCK  . BREAST SURGERY    . COLON SURGERY     2017  . COLONOSCOPY    . POLYPECTOMY    . PORTACATH PLACEMENT Left 06/30/2019   Procedure: INSERTION PORT-A-CATH WITH ULTRASOUND GUIDANCE;  Surgeon: Rolm Bookbinder, MD;  Location: [facility];  Service: General;  Laterality: Left;    There were no vitals filed for this visit.   Subjective Assessment - 07/28/19 1105    Subjective The surgery went well and I think everything is ok. I want you to look at something.    Pertinent History R breast cancer, ER +, PR -, HER 2+, will undergo R breast lumpectomy and SLNB on 06/30/19, stopped Tamoxifen a week ago    Patient Stated Goals to get info from provider    Currently in Pain? Yes    Pain Score 2      Pain Location Axilla    Pain Orientation Right    Pain Descriptors / Indicators Sore    Pain Type Surgical pain    Pain Onset 1 to 4 weeks ago    Pain Frequency Intermittent    Aggravating Factors  touching it    Pain Relieving Factors not touching it    Effect of Pain on Daily Activities no effect              [facility] PT Assessment - 07/28/19 0001      AROM   Right Shoulder Flexion 152 Degrees   168 after PROM   Right Shoulder ABduction 165 Degrees    Right Shoulder Internal Rotation 61 Degrees    Right Shoulder External Rotation 82 Degrees             LYMPHEDEMA/ONCOLOGY QUESTIONNAIRE - 07/28/19 0001      Surgeries   Sentinel Lymph Node Biopsy Date 06/30/19    Number Lymph Nodes Removed 3   all negative     Right Upper Extremity Lymphedema   15 cm Proximal to Olecranon Process 28.6 cm    Olecranon Process 24.5 cm    15 cm Proximal to Ulnar Styloid Process 24 cm    Just Proximal to Ulnar Styloid Process 16 cm  Across Hand at [employer] 20.6 cm    At [place] of 2nd Digit 6.8 cm                      [facility] Adult PT Treatment/Exercise - 07/28/19 0001      Exercises   Exercises Shoulder      Shoulder Exercises: Supine   Flexion AAROM;Both;10 reps   with dowel with 5 sec holds, pt return demonstrated   ABduction AAROM;Right;10 reps   with dowel with 5 sec holds, return demonstrated                 PT Education - 07/28/19 1157    Education Details lymphedema risk reduction practices    Person(s) Educated Patient;Other (comment)   sister   Methods Explanation;Handout    Comprehension Verbalized understanding               PT Long Term Goals - 07/28/19 1157      PT LONG TERM GOAL #1   Title Pt will return to baseline shoulder ROM following R breast lumpectomy.    Time 4    Period Weeks    Status Achieved                 Plan - 07/28/19 1200    Clinical Impression Statement Pt presents back to PT following a lumpectomy  and SLNB in May. She had 3 nodes removed and all were negative. She will be beginning chemotherapy soon. Remeased baseline measurements and pt does not demonstrate any change in circumference of RUE, she denies any breast swelling and reports her scars are soft. R shoulder ROM was nearly the same as baseline except for shoulder flexion which was 10 degrees less. PROM performed to R shoulder and pt was given supine cane exercises and return demonstrated. By end of session pt had full R shoulder flexion ROM. She also reports she developed a lump in her abdomen that began after eating on 07/22/19. She states it is tender to touch. Therapist was able to palpate this today. Therapist called triage nurse at [facility] and left a message regarding the lump and gave pt's cell phone number to reach so she can be seen. Pt has no other needs for skilled PT services at this time.    PT Frequency Monthy    PT Duration 4 weeks    PT Treatment/Interventions ADLs/Self Care Home Management;Patient/family education;Therapeutic exercise    PT Next Visit Plan d/c this visit    PT Home Exercise Plan post op breast exercises, supine cane exercises    Consulted and Agree with Plan of Care Patient           Patient will benefit from skilled therapeutic intervention in order to improve the following deficits and impairments:  Decreased knowledge of precautions, Postural dysfunction  Visit Diagnosis: Abnormal posture  Malignant neoplasm of upper-outer quadrant of right breast in female, estrogen receptor positive ([place])     Problem List Patient Active Problem List   Diagnosis Date Noted  . Malignant neoplasm of upper-outer quadrant of right breast in female, estrogen receptor positive ([place]) 06/17/2019  . Genetic testing 09/27/2018  . Monoallelic mutation of CHEK2 gene in female patient   . Family history of colon cancer   . Personal history of colonic polyps     Allyson Sabal [facility] 07/28/2019,  12:10 PM  [place], [place], [address] Phone:  [phone]   Fax:  [phone]  Name: Abigail Berg MRN: [id number] Date of Birth: [birth year]  Manus Gunning, PT 07/28/19 12:10 PM  PHYSICAL THERAPY DISCHARGE SUMMARY  Visits from Start of Care: 2  Current functional level related to goals / functional outcomes: All goals met   Remaining deficits: None   Education / Equipment: HEP  Plan: Patient agrees to discharge.  Patient goals were met. Patient is being discharged due to meeting the stated rehab goals.  ?????    Allyson Sabal [place], [place] 08/16/19 3:17 PM

## 2019-07-28 NOTE — Research (Signed)
S1714, A PROSPECTIVE OBSERVATIONAL COHORT STUDY TO DEVELOP A PREDICTIVE MODEL OF TAXANE-INDUCED PERIPHERAL NEUROPATHY IN CANCER PATIENTS.  07/28/2019 12:10PM  CONSENT VISIT: Met with Abigail Berg who was accompanied by her sister Abigail Berg, along with Clinical Research Nurse Hendricks Limes, after her lymphedema appointment.    Met with Abigail Berg and her sister Abigail Berg in private consultation room initially for 15 minutes, then patient had an add-on appointment with Abigail Berg, so we continued the consent process in an exam room for 45 minutes.  We reviewed the S1714 consent (protocol version date 12/12/2018, [place] Active Date 03/29/2019) and HIPPA form (dated 04/03/2017) with patient in their entirety. Explained the purpose of the study along with potential risks and benefits of participation.  Reviewed the study required assessments and timeline for completing these assessments. Informed patient that participation is completely voluntary and she may withdraw consent at any time. Upon completion or review, patient was offered to ask any questions or express any concerns. Gardiner Barefoot asked what would be a reason the study would contact the research team to contact a patient about a future study. Angie, RN answered question in entirety and Savina and Abigail Berg had no further questions. [name] signed and dated the consent and HIPPA form voluntarily. Patient agreed to the optional use of her blood for future research.  She did agree to being contacted by the study for future research. Copies of signed/dated consent and HIPPA form were given to patient for her records. Eligibility was confirmed by [name] and verified by clinical research nurse, [name]. Plan: Patient is scheduled to start chemotherapy with Paclitaxel/Trastuzumab tomorrow (07/29/2019). Patient was sent home with PROs and agreed to bring them completed in the morning when she returns for her appointments. Patient will complete baseline  neuropathy assessments at 11:30AM before her other first chemotherapy treatment (07/29/2019). Plan is to obtain Taxane lab draw on second treatment, 08/04/2019. Asked patient to contact research nurse if she has any questions or cannot make this appt for any reason.    DCP-001 USE OF A CLINICAL TRIAL SCREENING TOOL TO ADDRESS CANCER HEALTH DISPARITIES IN THE [employer] COMMUNITY ONCOLOGY RESEARCH PROGRAM [facility])   DCP-001CONSENT:Discussed DCP-001 after obtaining signed consent for [phone]. WereviewedtheDCP-001 consent form (protocol version date 07/27/18,  Active Date 02/08/19) in full, including the voluntary nature of participation, the project purpose, risks and benefits, and who will see the provided information.Susanverbalizes understanding that this study is a one-time consent for collection of demographic variables and that no patient identifiers are being reported. We also reviewed the [place] form (dated 03/27/14) including the data to be collected, why the information is being collected, who will see the information, and safety measures to keep information private.Upon completion of review,Susanverbalizes a desire to voluntarily participate in DCP-001 in addition to Y8144. All consent and authorization forms were signed and dated by the patient andmyself as the person obtaining consent andsigned copiesof both documentswere providedto [name].  DCP-001 WORKSHEET:After informed consent was obtained, the DCP-001 Worksheet was given to patient to collect ethnicity, language, literacy, education, employment, income and smoking history not available in the electronic medical record. DCP-001 questionnaire was sent with Abigail Berg in addition to [id number] PROs. Gardiner Barefoot were thanked for their time. [place] was thanked for her willingness to participate in both studies.   [place] Coordinator, RT (R)(T) 2:17PM

## 2019-07-28 NOTE — Progress Notes (Signed)
Met with patient at registration to introduce myself as Financial Resource Specialist and to offer available resources.  Discussed one-time $1000 [employer] grant and qualifications to assist with personal expenses while going through treatment.  Gave her my card if interested in applying and for any additional financial questions or concerns. 

## 2019-07-29 ENCOUNTER — Other Ambulatory Visit: Payer: Self-pay | Admitting: Oncology

## 2019-07-29 ENCOUNTER — Other Ambulatory Visit: Payer: Self-pay | Admitting: Hematology

## 2019-07-29 ENCOUNTER — Inpatient Hospital Stay: Payer: BC Managed Care – PPO

## 2019-07-29 ENCOUNTER — Encounter: Payer: Self-pay | Admitting: *Deleted

## 2019-07-29 ENCOUNTER — Other Ambulatory Visit: Payer: Self-pay

## 2019-07-29 ENCOUNTER — Inpatient Hospital Stay (HOSPITAL_BASED_OUTPATIENT_CLINIC_OR_DEPARTMENT_OTHER): Payer: BC Managed Care – PPO | Admitting: Medical

## 2019-07-29 VITALS — BP 136/75 | HR 76 | Temp 98.4°F | Resp 18 | Ht 68.0 in | Wt 183.5 lb

## 2019-07-29 DIAGNOSIS — C50411 Malignant neoplasm of upper-outer quadrant of right female breast: Secondary | ICD-10-CM

## 2019-07-29 DIAGNOSIS — F419 Anxiety disorder, unspecified: Secondary | ICD-10-CM

## 2019-07-29 DIAGNOSIS — Z170 Estrogen receptor positive status [ER+]: Secondary | ICD-10-CM | POA: Diagnosis not present

## 2019-07-29 DIAGNOSIS — Z1502 Genetic susceptibility to malignant neoplasm of ovary: Secondary | ICD-10-CM

## 2019-07-29 DIAGNOSIS — Z1501 Genetic susceptibility to malignant neoplasm of breast: Secondary | ICD-10-CM

## 2019-07-29 DIAGNOSIS — Z5112 Encounter for antineoplastic immunotherapy: Secondary | ICD-10-CM | POA: Diagnosis not present

## 2019-07-29 DIAGNOSIS — Z95828 Presence of other vascular implants and grafts: Secondary | ICD-10-CM | POA: Insufficient documentation

## 2019-07-29 DIAGNOSIS — Z5111 Encounter for antineoplastic chemotherapy: Secondary | ICD-10-CM | POA: Diagnosis not present

## 2019-07-29 LAB — CMP (CANCER CENTER ONLY)
ALT: 16 U/L (ref 0–44)
AST: 16 U/L (ref 15–41)
Albumin: 3.5 g/dL (ref 3.5–5.0)
Alkaline Phosphatase: 80 U/L (ref 38–126)
Anion gap: 10 (ref 5–15)
BUN: 12 mg/dL (ref 6–20)
CO2: 22 mmol/L (ref 22–32)
Calcium: 10.1 mg/dL (ref 8.9–10.3)
Chloride: 109 mmol/L (ref 98–111)
Creatinine: 0.82 mg/dL (ref 0.44–1.00)
GFR, Est AFR Am: >60 mL/min
GFR, Estimated: >60 mL/min
Glucose, Bld: 132 mg/dL — ABNORMAL HIGH (ref 70–99)
Potassium: 4 mmol/L (ref 3.5–5.1)
Sodium: 141 mmol/L (ref 135–145)
Total Bilirubin: 0.3 mg/dL (ref 0.3–1.2)
Total Protein: 6.4 g/dL — ABNORMAL LOW (ref 6.5–8.1)

## 2019-07-29 LAB — CBC WITH DIFFERENTIAL (CANCER CENTER ONLY)
Abs Immature Granulocytes: 0.01 K/uL (ref 0.00–0.07)
Basophils Absolute: 0.1 K/uL (ref 0.0–0.1)
Basophils Relative: 1 %
Eosinophils Absolute: 0.1 K/uL (ref 0.0–0.5)
Eosinophils Relative: 2 %
HCT: 43.5 % (ref 36.0–46.0)
Hemoglobin: 14.9 g/dL (ref 12.0–15.0)
Immature Granulocytes: 0 %
Lymphocytes Relative: 43 %
Lymphs Abs: 2.6 K/uL (ref 0.7–4.0)
MCH: 31.8 pg (ref 26.0–34.0)
MCHC: 34.3 g/dL (ref 30.0–36.0)
MCV: 92.9 fL (ref 80.0–100.0)
Monocytes Absolute: 0.4 K/uL (ref 0.1–1.0)
Monocytes Relative: 6 %
Neutro Abs: 2.8 K/uL (ref 1.7–7.7)
Neutrophils Relative %: 48 %
Platelet Count: 228 K/uL (ref 150–400)
RBC: 4.68 MIL/uL (ref 3.87–5.11)
RDW: 13.7 % (ref 11.5–15.5)
WBC Count: 6 K/uL (ref 4.0–10.5)
nRBC: 0 % (ref 0.0–0.2)

## 2019-07-29 LAB — RESEARCH LABS

## 2019-07-29 MED ORDER — LORAZEPAM 1 MG PO TABS: 0.5000 mg | ORAL_TABLET | Freq: Once | ORAL | Status: DC

## 2019-07-29 MED ORDER — SODIUM CHLORIDE 0.9 % IV SOLN
Freq: Once | INTRAVENOUS | Status: AC
  Filled 2019-07-29: qty 250

## 2019-07-29 MED ORDER — HEPARIN SOD (PORK) LOCK FLUSH 100 UNIT/ML IV SOLN
500.0000 [IU] | Freq: Once | INTRAVENOUS | Status: AC | PRN
  Administered 2019-07-29: 500 [IU]
  Filled 2019-07-29: qty 5

## 2019-07-29 MED ORDER — ACETAMINOPHEN 325 MG PO TABS
ORAL_TABLET | ORAL | Status: AC
  Filled 2019-07-29: qty 2

## 2019-07-29 MED ORDER — PROCHLORPERAZINE MALEATE 10 MG PO TABS
ORAL_TABLET | ORAL | Status: AC
  Filled 2019-07-29: qty 1

## 2019-07-29 MED ORDER — DIPHENHYDRAMINE HCL 50 MG/ML IJ SOLN
50.0000 mg | Freq: Once | INTRAMUSCULAR | Status: AC
  Administered 2019-07-29: 50 mg via INTRAVENOUS

## 2019-07-29 MED ORDER — SODIUM CHLORIDE 0.9% FLUSH
10.0000 mL | Freq: Once | INTRAVENOUS | Status: AC
  Administered 2019-07-29: 10 mL
  Filled 2019-07-29: qty 10

## 2019-07-29 MED ORDER — LORAZEPAM 0.5 MG PO TABS: 0.5000 mg | ORAL_TABLET | Freq: Three times a day (TID) | ORAL | 0 refills | Status: DC

## 2019-07-29 MED ORDER — TRASTUZUMAB-DKST CHEMO 150 MG IV SOLR
4.0000 mg/kg | Freq: Once | INTRAVENOUS | Status: AC
  Administered 2019-07-29: 336 mg via INTRAVENOUS
  Filled 2019-07-29: qty 16

## 2019-07-29 MED ORDER — PROCHLORPERAZINE MALEATE 10 MG PO TABS
10.0000 mg | ORAL_TABLET | Freq: Once | ORAL | Status: AC
  Administered 2019-07-29: 10 mg via ORAL

## 2019-07-29 MED ORDER — SODIUM CHLORIDE 0.9% FLUSH
10.0000 mL | INTRAVENOUS | Status: DC | PRN
  Administered 2019-07-29: 10 mL
  Filled 2019-07-29: qty 10

## 2019-07-29 MED ORDER — PACLITAXEL PROTEIN-BOUND CHEMO INJECTION 100 MG
100.0000 mg/m2 | Freq: Once | INTRAVENOUS | Status: AC
  Administered 2019-07-29: 200 mg via INTRAVENOUS
  Filled 2019-07-29: qty 40

## 2019-07-29 MED ORDER — DIPHENHYDRAMINE HCL 50 MG/ML IJ SOLN
INTRAMUSCULAR | Status: AC
  Filled 2019-07-29: qty 1

## 2019-07-29 MED ORDER — SODIUM CHLORIDE 0.9 % IV SOLN: 20.0000 mg | Freq: Once | INTRAVENOUS | Status: DC

## 2019-07-29 MED ORDER — ACETAMINOPHEN 325 MG PO TABS
650.0000 mg | ORAL_TABLET | Freq: Once | ORAL | Status: AC
  Administered 2019-07-29: 650 mg via ORAL

## 2019-07-29 NOTE — Progress Notes (Signed)
Patient seen by Van Tanner, PA.    

## 2019-07-29 NOTE — Progress Notes (Signed)
OK to proceed with chemotherapy.  [name], MHS, PA-C Physician Assistant

## 2019-07-29 NOTE — Progress Notes (Signed)
[facility]   [phone] [phone]   Clinic Follow up Note   Patient Care Team: Patient, [name] as PCP - General (General Practice) Armbruster, Carlota Raspberry, MD as Consulting Physician (Gastroenterology) Eyvonne Mechanic as Counselor (Licensed Clinical Social Worker) Truitt Merle, MD as Consulting Physician (Hematology) Alla Feeling, NP as Nurse Practitioner (Nurse Practitioner) Mauro Kaufmann, RN as Oncology Nurse Navigator Rockwell Germany, RN as Oncology Nurse Navigator Rolm Bookbinder, MD as Consulting Physician (General Surgery) Gwyndolyn Kaufman, RN as Registered Nurse  Date of Service: 07/15/2019  CHIEF COMPLAINT: F/u of Right breast cancer, CHEK2 mutation. [name] presents today for cycle 1, day 1 of Abraxane and Ogivri.  SUMMARY OF ONCOLOGIC HISTORY: Oncology History Overview Note  Cancer Staging Malignant neoplasm of upper-outer quadrant of right breast in female, estrogen receptor positive ([place]) Staging form: Breast, AJCC 8th Edition - Clinical stage from 06/01/2019: Stage IA (cT1c, cN0, cM0, G3, ER+, PR-, HER2+) - Signed by Truitt Merle, MD on 06/17/2019 - Pathologic stage from 06/30/2019: Stage IA (pT1c, pN0, cM0, G3, ER+, PR-, HER2+) - Signed by Truitt Merle, MD on 07/15/2019    Malignant neoplasm of upper-outer quadrant of right breast in female, estrogen receptor positive ([place])  10/2018 -  Anti-estrogen oral therapy   She was preventively started on Tamoxifen since 10/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 Held after 06/17/19 to proceed with her breast cancer surgery and chemo.    05/06/2019 Breast MRI   IMPRESSION Enhancing right breast mass suspicious for malignancy. Second look [place] if recommended.  No Suspicious enhancing left breast masses.    05/24/2019 Breast US   IMPRESSION 1.8cm irregular right breast mass within the 9:30 position 3.5cm from the nipple is highly suggestive of malignancy. An Ultrasound guided breast biopsy is  recommended. The findings of the study and recommendation for biopsy were discussed with the patient directly.    06/01/2019 Cancer Staging   Staging form: Breast, AJCC 8th Edition - Clinical stage from 06/01/2019: Stage IA (cT1c, cN0, cM0, G3, ER+, PR-, HER2+) - Signed by Truitt Merle, MD on 06/17/2019   06/01/2019 Initial Biopsy   Breast Biopsy - Right Breast Core Bx DIAGNOSIS INFILTRATING DUCT CARCINOMA WITH MINOR DUCTAL CARCINOMA IN SITU COMPNENT OF RIGHT BREAST, CORE NEEDLE BIOPSY    06/01/2019 Receptors her2   ER: greater than 75% of tumor cells show moderate staining  PR: Negative  HER2: Positive 3+ Ki67: 40%   06/17/2019 Initial Diagnosis   Malignant neoplasm of upper-outer quadrant of right breast in female, estrogen receptor positive ([place])   06/30/2019 Surgery   RIGHT BREAST LUMPECTOMY WITH RADIOACTIVE SEED AND RIGHT AXILLARY SENTINEL NODE BIOPSY and PAC placement  by Dr Donne Hazel     06/30/2019 Pathology Results   FINAL MICROSCOPIC DIAGNOSIS:   A. BREAST, RIGHT, LUMPECTOMY:  - Invasive ductal carcinoma, grade 3, spanning 1.5 cm.  - Intermediate grade ductal carcinoma in situ.  - Invasive carcinoma is 0.1-0.2 from the final lateral margin (part E).  - Margins are negative for in situ carcinoma.  - Biopsy site.  - See oncology table.   B. LYMPH NODE, RIGHT AXILLARY, SENTINEL, BIOPSY:  - One of one lymph nodes negative for carcinoma (0/1).   C. LYMPH NODE, RIGHT AXILLARY, SENTINEL, BIOPSY:  - One of one lymph nodes negative for carcinoma (0/1).   D. LYMPH NODE, RIGHT AXILLARY, SENTINEL, BIOPSY:  - One of one lymph nodes negative for carcinoma (0/1).   E. BREAST, RIGHT  ADDITIONAL LATERAL MARGIN, EXCISION:  - Invasive ductal carcinoma.  - Invasive carcinoma is 0.1-0.2 cm from the new lateral margin.   F. BREAST, RIGHT ADDITIONAL POSTERIOR MARGIN, EXCISION:  - Fibrocystic change and usual ductal hyperplasia.  - No malignancy identified.   G. BREAST, RIGHT ADDITIONAL  SUPERIOR MARGIN, EXCISION:  - Fibrocystic change and usual ductal hyperplasia.  - Incidental radial scar.  - No malignancy identified.     PROGNOSTIC INDICATOR RESULTS:  The tumor cells are POSITIVE for Her2 (3+).  Estrogen Receptor:       POSITIVE, 85%, MODERATE STAINING  Progesterone Receptor:   NEGATIVE  Proliferation Marker Ki-67:   25%    06/30/2019 Cancer Staging   Staging form: Breast, AJCC 8th Edition - Pathologic stage from 06/30/2019: Stage IA (pT1c, pN0, cM0, G3, ER+, PR-, HER2+) - Signed by Truitt Merle, MD on 07/15/2019   07/29/2019 -  Chemotherapy   The patient had PACLitaxel-protein bound (ABRAXANE) chemo infusion 200 mg, 100 mg/m2 = 200 mg (100 % of original dose 100 mg/m2), Intravenous,  Once, 1 of 3 cycles Dose modification: 100 mg/m2 (original dose 100 mg/m2, Cycle 1, Reason: Provider Judgment) Administration: 200 mg (07/29/2019) trastuzumab-dkst (OGIVRI) 336 mg in sodium chloride 0.9 % 250 mL chemo infusion, 4 mg/kg = 336 mg, Intravenous,  Once, 1 of 16 cycles Administration: 336 mg (07/29/2019)  for chemotherapy treatment.       CURRENT THERAPY:  Abraxane and Ogivri with cycle 1 dosed on 07/29/2019.  INTERVAL HISTORY:  Abigail Berg is a 54 y.o. female with a diagnosis of malignant neoplasm of upper-outer quadrant of right breast, Stage 1A, p(T1cN0M0), stage IA, ER+/PR-/HER2+, Grade 3. She presents to the clinic for cycle 1, day 1 of Abraxane and Ogivri.  The patient is very anxious regarding her chemotherapy start today and ask if she can be given something for her anxiety.  Based on this we will plan on giving her a dose of Ativan today and will give her Ativan that she can use at home for her episodic anxiety.  She was seen yesterday when it was reported that she had a mass in her lower mid abdomen.  She is pending imaging studies to determine if this area is a ventral hernia.  Otherwise there are no issues of concern today.  MEDICAL HISTORY:  Past Medical History:   Diagnosis Date  . Depression   . Family history of colon cancer   . Monoallelic mutation of CHEK2 gene in female patient   . Personal history of colonic polyps     SURGICAL HISTORY: Past Surgical History:  Procedure Laterality Date  . BREAST LUMPECTOMY WITH RADIOACTIVE SEED AND SENTINEL LYMPH NODE BIOPSY Right 06/30/2019   Procedure: RIGHT BREAST LUMPECTOMY WITH RADIOACTIVE SEED AND RIGHT AXILLARY SENTINEL NODE BIOPSY;  Surgeon: Rolm Bookbinder, MD;  Location: [facility];  Service: General;  Laterality: Right;  PEC BLOCK  . BREAST SURGERY    . COLON SURGERY     2017  . COLONOSCOPY    . POLYPECTOMY    . PORTACATH PLACEMENT Left 06/30/2019   Procedure: INSERTION PORT-A-CATH WITH ULTRASOUND GUIDANCE;  Surgeon: Rolm Bookbinder, MD;  Location: [facility];  Service: General;  Laterality: Left;    I have reviewed the social history and family history with the patient and they are unchanged from previous note.  ALLERGIES:  has No Known Allergies.  MEDICATIONS:  Current Outpatient Medications  Medication Sig Dispense Refill  . acetaminophen (TYLENOL) 325 MG tablet  Take 650 mg by mouth every 6 (six) hours as needed for headache.    . escitalopram (LEXAPRO) 20 MG tablet Take 20 mg by mouth daily.     Marland Kitchen ibuprofen (ADVIL) 200 MG tablet Take 200 mg by mouth every 6 (six) hours as needed for headache or mild pain.    Marland Kitchen lidocaine-prilocaine (EMLA) cream Apply to affected area once 30 g 3  . LORazepam (ATIVAN) 0.5 MG tablet Take 1 tablet (0.5 mg total) by mouth every 8 (eight) hours. 30 tablet 0  . Multiple Vitamins-Minerals (CENTRUM WOMEN PO) Take 1 tablet by mouth daily.    . ondansetron (ZOFRAN) 8 MG tablet Take 1 tablet (8 mg total) by mouth 2 (two) times daily as needed (Nausea or vomiting). 30 tablet 1  . oxyCODONE (OXY IR/ROXICODONE) 5 MG immediate release tablet Take 1 tablet (5 mg total) by mouth every 6 (six) hours as needed. 10 tablet 0  .  prochlorperazine (COMPAZINE) 10 MG tablet Take 1 tablet (10 mg total) by mouth every 6 (six) hours as needed (Nausea or vomiting). 30 tablet 1  . tamoxifen (NOLVADEX) 20 MG tablet Take 1 tablet (20 mg total) by mouth daily. 30 tablet 5   Current Facility-Administered Medications  Medication Dose Route Frequency Provider Last Rate Last Admin  . LORazepam (ATIVAN) tablet 0.5 mg  0.5 mg Oral Once Harle Stanford., PA-C        REVIEW OF SYSTEMS:  Review of Systems  Constitutional: Negative for chills, fever and malaise/fatigue.  Respiratory: Negative for cough, sputum production and shortness of breath.   Cardiovascular: Negative for chest pain, palpitations and leg swelling.  Gastrointestinal: Positive for abdominal pain (Area of fullness and tenderness in the lower central abdomen in the midline.). Negative for constipation, diarrhea, nausea and vomiting.  Genitourinary: Negative for dysuria and urgency.  Musculoskeletal: Negative for back pain and neck pain.  Skin: Negative for itching and rash.  Neurological: Negative for dizziness, weakness and headaches.  Psychiatric/Behavioral: The patient is nervous/anxious and has insomnia.      PHYSICAL EXAMINATION: ECOG PERFORMANCE STATUS: 1 - Symptomatic but completely ambulatory  Vitals:   07/29/19 1107  BP: 136/75  Pulse: 76  Resp: 18  Temp: 98.4 F (36.9 C)  SpO2: 100%   Filed Weights   07/29/19 1107  Weight: 183 lb 8 oz (83.2 kg)   Physical Exam Constitutional:      General: She is not in acute distress.    Appearance: Normal appearance. She is not ill-appearing.  HENT:     Head: Normocephalic and atraumatic.  Eyes:     General: No scleral icterus.       Right eye: No discharge.        Left eye: No discharge.     Conjunctiva/sclera: Conjunctivae normal.  Cardiovascular:     Rate and Rhythm: Normal rate and regular rhythm.     Heart sounds: No murmur heard.  No friction rub. No gallop.   Pulmonary:     Effort: Pulmonary  effort is normal. No respiratory distress.     Breath sounds: Normal breath sounds. No wheezing, rhonchi or rales.  Musculoskeletal:     Cervical back: Normal range of motion and neck supple.     Right lower leg: No edema.     Left lower leg: No edema.  Lymphadenopathy:     Cervical: No cervical adenopathy.  Skin:    General: Skin is warm and dry.  Neurological:     Mental Status:  She is alert.     Coordination: Coordination normal.     Gait: Gait normal.      LABORATORY DATA:  I have reviewed the data as listed CBC Latest Ref Rng & Units 07/29/2019 07/15/2019 06/17/2019  WBC 4.0 - 10.5 K/uL 6.0 7.0 6.8  Hemoglobin 12.0 - 15.0 g/dL 14.9 14.8 15.1(H)  Hematocrit 36 - 46 % 43.5 44.2 44.0  Platelets 150 - 400 K/uL 228 222 245     CMP Latest Ref Rng & Units 07/29/2019 07/15/2019 06/17/2019  Glucose 70 - 99 mg/dL 132(H) 103(H) 113(H)  BUN 6 - 20 mg/dL '12 14 13  '$ Creatinine 0.44 - 1.00 mg/dL 0.82 0.89 0.88  Sodium 135 - 145 mmol/L 141 142 138  Potassium 3.5 - 5.1 mmol/L 4.0 4.4 3.7  Chloride 98 - 111 mmol/L 109 107 106  CO2 22 - 32 mmol/L '22 26 24  '$ Calcium 8.9 - 10.3 mg/dL 10.1 10.9(H) 11.3(H)  Total Protein 6.5 - 8.1 g/dL 6.4(L) 6.9 7.0  Total Bilirubin 0.3 - 1.2 mg/dL 0.3 0.2(L) 0.2(L)  Alkaline Phos 38 - 126 U/L 80 75 74  AST 15 - 41 U/L 16 15 14(L)  ALT 0 - 44 U/L '16 12 9      '$ RADIOGRAPHIC STUDIES: I have personally reviewed the radiological images as listed and agreed with the findings in the report. No results found.   ASSESSMENT & PLAN:  Abigail Berg is a 54 y.o. female with    1. Malignant neoplasm of upper-outer quadrant of right breast, Stage 1A, p(T1cN0M0), stage IA, ER+/PR-/HER2+, Grade 3.  -She was diagnosed in 05/2019. She underwent right breast lumpectomy with SLNB by Dr Donne Hazel on 06/30/19. We discussed her pathology which showed a 1.5 cm grade 3 invasive ductal carcinoma, ER positive, HER-2 positive (repeated), margins were all negative, lymph nodes were  negative. -I reviewed her bone scan findings with her, which was negative for definitive bone metastasis. -We discussed that HER-2 positive breast cancer is more aggressive, and predicts high risk of recurrence.  Recommend chemo with weekly Taxol (or Abraxane) X12 or Docetaxel q3weeks with anti-HER2 Herceptin followed by maintenance Herceptin q3weeks to complete 1 year of treatment. -Patient agrees to have weekly Taxol as adjuvant chemotherapy.   -Her echocardiogram from Jun 24, 2019 showed normal EF --Chemotherapy consent: Side effects including but does not not limited to, fatigue, nausea, vomiting, diarrhea, hair loss, neuropathy, fluid retention, renal and kidney dysfunction, neutropenic fever, needed for blood transfusion, bleeding, reversible cardiomyopathy, heart failure, were discussed with patient in great detail. She agrees to proceed. -The goal of therapy is curative -Previously discussed DigniCap, she is not very interested  -Cycle #1, day #1 of Abraxane and Ogivri dosed on 07/29/2019  2. 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 -Wepreviouslydiscussed that [place] 1100delC mutation is associated with 2-4 times high risk of breast cancer than general population, especially in people with strong family history of breast cancer (she only has 1 family member with breast cancer and 3 with colon cancer). Her estimated lifetime risk of breast cancer is probably around 25-35% -She will continue annual screening breast MRI in addition to mammogram, self breast exam, and physician breast exam twice a year.Her GYN will order for her to be done locally.  -We also discussed themoderately increasedrisk of colon cancer from CHEK2 mutation; she will f/u with Dr. Havery Moros for screening colonoscopies, last done 07/2018 with12polyps. I recommend to repeat in next 2  years.  -She started chemoprevention withTamoxifen once daily in  10/2018. She has tolerated Tamoxifen moderately well but attributes recent Fatigue andmildnausea to Tamoxifen. Stopped on 06/17/19 to proceed with start of breast cancer treatment.  -Cycle #1, day #1 of Abraxane and Ogivri dosed on 07/29/2019   3. Hypercalcemia, likely primary hyperparathyroidism -She is not on Calcium or Vit D, but takes MVI  -Her PTH was within normal limits, but on the high end.  PTHrp was negative. This is likely primary hyperparathyroidism -We will refer her to endocrinology after she completes adjuvant chemotherapy -I recommend her to take a multivitamin which contains low-dose calcium -Calcium at 10.1 on 07/29/2019  4. Anxiety -Patient given Ativan 0.5 mg IV on 07/29/2019 and a prescription for PO Ativan   PLAN: -Proceed with cycle #1, day #1 of Abraxane and Ogivri today. Return as scheduled on 08/04/2019 -Proceed with neuropathy trial  -Ativan 0.5 mg IV x 1 today for anxiety. Given prescription for Ativan 0.5 mg PO.  No problem-specific Assessment & Plan notes found for this encounter.   No orders of the defined types were placed in this encounter.     Harle Stanford, PA-C 08/01/2019

## 2019-07-29 NOTE — Progress Notes (Signed)
The patient was seen today after she had her meeting with clinical trials.  She reports having a "abdominal lump" in her mid abdomen.  She reports that this area is more noticeable after eating.  She is to begin chemotherapy tomorrow for breast cancer.  Examination of her abdomen shows an area that is possibly consistent with a ventral hernia.  The patient will be referred for an ultrasound or a CT scan for further evaluation of this area.  She will return tomorrow for her chemotherapy start.  [name], MHS, PA-C Physician Assistant

## 2019-07-29 NOTE — Patient Instructions (Signed)
[facility] Discharge Instructions for Patients Receiving Chemotherapy  Today you received the following immunotherapy agent: Trastuzumab chemotherapy agents: Paclitaxel - protein bound (Abraxane)  To help prevent nausea and vomiting after your treatment, we encourage you to take your nausea medication as directed by your MD.   If you develop nausea and vomiting that is not controlled by your nausea medication, call the clinic.   BELOW ARE SYMPTOMS THAT SHOULD BE REPORTED IMMEDIATELY:  *FEVER GREATER THAN 100.5 F  *CHILLS WITH OR WITHOUT FEVER  NAUSEA AND VOMITING THAT IS NOT CONTROLLED WITH YOUR NAUSEA MEDICATION  *UNUSUAL SHORTNESS OF BREATH  *UNUSUAL BRUISING OR BLEEDING  TENDERNESS IN MOUTH AND THROAT WITH OR WITHOUT PRESENCE OF ULCERS  *URINARY PROBLEMS  *BOWEL PROBLEMS  UNUSUAL RASH Items with * indicate a potential emergency and should be followed up as soon as possible.  Feel free to call the clinic should you have any questions or concerns. The clinic phone number is [phone].  Please show the [place] at check-in to the Emergency Department and triage nurse.  Trastuzumab injection for infusion What is this medicine? TRASTUZUMAB (tras TOO zoo mab) is a monoclonal antibody. It is used to treat breast cancer and stomach cancer. This medicine may be used for other purposes; ask your health care provider or pharmacist if you have questions. COMMON BRAND NAME(S): Herceptin, Galvin Proffer, Trazimera What should I tell my health care provider before I take this medicine? They need to know if you have any of these conditions:  heart disease  heart failure  lung or breathing disease, like asthma  an unusual or allergic reaction to trastuzumab, benzyl alcohol, or other medications, foods, dyes, or preservatives  pregnant or trying to get pregnant  breast-feeding How should I use this medicine? This drug is given  as an infusion into a vein. It is administered in a hospital or clinic by a specially trained health care professional. Talk to your pediatrician regarding the use of this medicine in children. This medicine is not approved for use in children. Overdosage: If you think you have taken too much of this medicine contact a poison control center or emergency room at once. NOTE: This medicine is only for you. Do not share this medicine with others. What if I miss a dose? It is important not to miss a dose. Call your doctor or health care professional if you are unable to keep an appointment. What may interact with this medicine? This medicine may interact with the following medications:  certain types of chemotherapy, such as daunorubicin, doxorubicin, epirubicin, and idarubicin This list may not describe all possible interactions. Give your health care provider a list of all the medicines, herbs, non-prescription drugs, or dietary supplements you use. Also tell them if you smoke, drink alcohol, or use illegal drugs. Some items may interact with your medicine. What should I watch for while using this medicine? Visit your doctor for checks on your progress. Report any side effects. Continue your course of treatment even though you feel ill unless your doctor tells you to stop. Call your doctor or health care professional for advice if you get a fever, chills or sore throat, or other symptoms of a cold or flu. Do not treat yourself. Try to avoid being around people who are sick. You may experience fever, chills and shaking during your first infusion. These effects are usually mild and can be treated with other medicines. Report any side effects during  the infusion to your health care professional. Fever and chills usually do not happen with later infusions. Do not become pregnant while taking this medicine or for 7 months after stopping it. Women should inform their doctor if they wish to become pregnant or  think they might be pregnant. Women of child-bearing potential will need to have a negative pregnancy test before starting this medicine. There is a potential for serious side effects to an unborn child. Talk to your health care professional or pharmacist for more information. Do not breast-feed an infant while taking this medicine or for 7 months after stopping it. Women must use effective birth control with this medicine. What side effects may I notice from receiving this medicine? Side effects that you should report to your doctor or health care professional as soon as possible:  allergic reactions like skin rash, itching or hives, swelling of the face, lips, or tongue  chest pain or palpitations  cough  dizziness  feeling faint or lightheaded, falls  fever  general ill feeling or flu-like symptoms  signs of worsening heart failure like breathing problems; swelling in your legs and feet  unusually weak or tired Side effects that usually do not require medical attention (report to your doctor or health care professional if they continue or are bothersome):  bone pain  changes in taste  diarrhea  joint pain  nausea/vomiting  weight loss This list may not describe all possible side effects. Call your doctor for medical advice about side effects. You may report side effects to FDA at 1-800-FDA-1088. Where should I keep my medicine? This drug is given in a hospital or clinic and will not be stored at home. NOTE: This sheet is a summary. It may not cover all possible information. If you have questions about this medicine, talk to your doctor, pharmacist, or health care provider.  2020 Elsevier/Gold Standard (2016-01-15 14:37:52)  Nanoparticle Albumin-Bound Paclitaxel injection What is this medicine? NANOPARTICLE ALBUMIN-BOUND PACLITAXEL (Na no PAHR ti kuhl al BYOO muhn-bound PAK li TAX el) is a chemotherapy drug. It targets fast dividing cells, like cancer cells, and causes  these cells to die. This medicine is used to treat advanced breast cancer, lung cancer, and pancreatic cancer. This medicine may be used for other purposes; ask your health care provider or pharmacist if you have questions. COMMON BRAND NAME(S): Abraxane What should I tell my health care provider before I take this medicine? They need to know if you have any of these conditions:  kidney disease  liver disease  low blood counts, like low white cell, platelet, or red cell counts  lung or breathing disease, like asthma  tingling of the fingers or toes, or other nerve disorder  an unusual or allergic reaction to paclitaxel, albumin, other chemotherapy, other medicines, foods, dyes, or preservatives  pregnant or trying to get pregnant  breast-feeding How should I use this medicine? This drug is given as an infusion into a vein. It is administered in a hospital or clinic by a specially trained health care professional. Talk to your pediatrician regarding the use of this medicine in children. Special care may be needed. Overdosage: If you think you have taken too much of this medicine contact a poison control center or emergency room at once. NOTE: This medicine is only for you. Do not share this medicine with others. What if I miss a dose? It is important not to miss your dose. Call your doctor or health care professional if you are  unable to keep an appointment. What may interact with this medicine? This medicine may interact with the following medications:  antiviral medicines for hepatitis, HIV or AIDS  certain antibiotics like erythromycin and clarithromycin  certain medicines for fungal infections like ketoconazole and itraconazole  certain medicines for seizures like carbamazepine, phenobarbital, phenytoin  gemfibrozil  nefazodone  rifampin  St. John's wort This list may not describe all possible interactions. Give your health care provider a list of all the medicines,  herbs, non-prescription drugs, or dietary supplements you use. Also tell them if you smoke, drink alcohol, or use illegal drugs. Some items may interact with your medicine. What should I watch for while using this medicine? Your condition will be monitored carefully while you are receiving this medicine. You will need important blood work done while you are taking this medicine. This medicine can cause serious allergic reactions. If you experience allergic reactions like skin rash, itching or hives, swelling of the face, lips, or tongue, tell your doctor or health care professional right away. In some cases, you may be given additional medicines to help with side effects. Follow all directions for their use. This drug may make you feel generally unwell. This is not uncommon, as chemotherapy can affect healthy cells as well as cancer cells. Report any side effects. Continue your course of treatment even though you feel ill unless your doctor tells you to stop. Call your doctor or health care professional for advice if you get a fever, chills or sore throat, or other symptoms of a cold or flu. Do not treat yourself. This drug decreases your body's ability to fight infections. Try to avoid being around people who are sick. This medicine may increase your risk to bruise or bleed. Call your doctor or health care professional if you notice any unusual bleeding. Be careful brushing and flossing your teeth or using a toothpick because you may get an infection or bleed more easily. If you have any dental work done, tell your dentist you are receiving this medicine. Avoid taking products that contain aspirin, acetaminophen, ibuprofen, naproxen, or ketoprofen unless instructed by your doctor. These medicines may hide a fever. Do not become pregnant while taking this medicine or for 6 months after stopping it. Women should inform their doctor if they wish to become pregnant or think they might be pregnant. Men should  not father a child while taking this medicine or for 3 months after stopping it. There is a potential for serious side effects to an unborn child. Talk to your health care professional or pharmacist for more information. Do not breast-feed an infant while taking this medicine or for 2 weeks after stopping it. This medicine may interfere with the ability to get pregnant or to father a child. You should talk to your doctor or health care professional if you are concerned about your fertility. What side effects may I notice from receiving this medicine? Side effects that you should report to your doctor or health care professional as soon as possible:  allergic reactions like skin rash, itching or hives, swelling of the face, lips, or tongue  breathing problems  changes in vision  fast, irregular heartbeat  low blood pressure  mouth sores  pain, tingling, numbness in the hands or feet  signs of decreased platelets or bleeding - bruising, pinpoint red spots on the skin, black, tarry stools, blood in the urine  signs of decreased red blood cells - unusually weak or tired, feeling faint or lightheaded,  falls  signs of infection - fever or chills, cough, sore throat, pain or difficulty passing urine  signs and symptoms of liver injury like dark yellow or brown urine; general ill feeling or flu-like symptoms; light-colored stools; loss of appetite; nausea; right upper belly pain; unusually weak or tired; yellowing of the eyes or skin  swelling of the ankles, feet, hands  unusually slow heartbeat Side effects that usually do not require medical attention (report to your doctor or health care professional if they continue or are bothersome):  diarrhea  hair loss  loss of appetite  nausea, vomiting  tiredness This list may not describe all possible side effects. Call your doctor for medical advice about side effects. You may report side effects to FDA at 1-800-FDA-1088. Where should I  keep my medicine? This drug is given in a hospital or clinic and will not be stored at home. NOTE: This sheet is a summary. It may not cover all possible information. If you have questions about this medicine, talk to your doctor, pharmacist, or health care provider.  2020 Elsevier/Gold Standard (2016-09-23 13:03:45)

## 2019-07-29 NOTE — Research (Signed)
[id number] - STUDYING HOW CANCER TREATMENT AFFECTS THE NERVES IN YOUR HANDS AND FEET   07/29/2019 1010AM Abigail Berg arrives today for her baseline S1714 visit and first taxane treatment. She is accompanied by her friend Abigail Berg for today's visit. Abigail Berg was able to complete her PROs prior to arrival this morning as she commutes from [place]. All PROs are reviewed and are complete.    REGISTRATION (1021AM): Once PROs were reviewed for completeness, Theola was registered for the S1714 study per protocol. Her study ID# is [id number]. Study eligibility was verified by myself with a second confirmation of eligibility by clinical research nurse [name]. Registration paperwork and PID# were provided to research specialist Remer Macho for lab specimen labels.   LABS (1050AM): Mandatory and optional labs were collected per Laylanie's consent and study protocol: Abigail Berg tolerated without complaint.   MEDICATION REVIEW (1110AM): Prior to the provider visit, the current med list was reviewed with the following changes noted:  -Oxycodone was prescribed following surgery: she verbalizes she is not currently taking this medication. -Tiajuana reports her tamoxifen was discontinued at her 06/17/2019 visit and she is currently not taking. Per Dr. Franchot Heidelberg [phone] Study Chair), this prescription was chemopreventative and should not be considered treatment for this cancer; the onstudy form reflects this response accordingly. -Abigail Berg uses extra-strength Tylenol prn for occasional headache pain or occasionally uses regular strength Advil (also prn). These are added to her med list. -She started taking Centrum Women's this past Monday (07/25/19) as directed on the label (one tablet per day). This medication is also added to her med list. Vitamin E, B6, and B12 dosages were obtained from current labeling obtained online and reflected on study paperwork.  Reported changes were recorded on the medication list and marked as reviewed. All other listed  medications remain the same.  SMOKING HISTORY: Abigail Berg currently smokes between a half pack to almost a pack of cigarettes daily and states she has been smoking for 30 years.  NEURO ASSESSMENT: The neuro assessment was completed by this nurse along with clinical research coordinator Carol Ada as a scribe. Abigail Berg is right-handed and is right-foot dominant. Abigail Berg tolerated all neuropen and tuning fork testing without complaint.   PROVIDER VISIT: [name] is seen by [name], PA-C, today.  INFUSION WAITING AREA: Upon completion of above activities, patient was escorted to the infusion waiting area. [name] was thanked for her time and voluntary participation in the S1714 study.   TAXANE SPECIMEN: The [facility] lab currently closes at 3pm on Fridays; therefore, collecting a lab specimen during the last ten minutes of her infusion is not possible today. Her next infusion is scheduled for Thursday, 08/04/19 at 10:45am and the current plan is to obtain the required taxane specimen during that infusion. [name] was advised of the anticipated specimen draw during the 08/04/19 visit and verbalizes understanding. Should the specimen not be obtained, we will attempt collection at a subsequent visit. Abigail Berg is encouraged to contact me for any needs or questions and she verbalizes understanding.  Dionne Bucy. Sharlett Iles, BSN, RN, CIC 07/29/2019 3:13 PM

## 2019-08-01 ENCOUNTER — Encounter: Payer: Self-pay | Admitting: Oncology

## 2019-08-01 ENCOUNTER — Telehealth: Payer: Self-pay | Admitting: *Deleted

## 2019-08-01 NOTE — Telephone Encounter (Signed)
Called pt to see how she did with her treatment last week.  She states she feels that she did well.  She has some nausea & took nausea med which helped.  She had one diarrhea stool that went away on it's own.  She had some itching under her breast & abdomen & she took benadryl & that helped.  She denies any other problems.  Informed to call with any questions/concerns.

## 2019-08-01 NOTE — Progress Notes (Addendum)
Cardiology Office Note   Date:  08/02/2019   ID:  Abigail Berg, DOB April 09, 1965, MRN [id number]  PCP:  Patient, [name]  Cardiologist:   Minus Breeding, MD Referring:    Truitt Merle, MD  No chief complaint on file.     History of Present Illness: Abigail Berg is a 54 y.o. female who presents for evaluation of high risk medication.  She has breast cancer and is being treated with trastuzumab and Abraxane.  She does not have high risk findings other than age greater than 59.  She is going to have radiation and had lumpectomy with lymph nodes.  The plan is apparently for her to have weekly Abraxane and trastuzumab x12.  She has not however had any prior cardiac history.  She has no history of chest pressure, neck or arm discomfort.  She has no shortness of breath, PND or orthopnea.  She not have palpitations, presyncope or syncope.  She has been physically active.  She is never had any cardiac work-up.  She did have a baseline echocardiogram.  EF on echo 06/27/2019 demonstrated a normal EF.    She has been treated with lumpectomy and lymph node resection.   Past Medical History:  Diagnosis Date  . Depression   . Family history of colon cancer   . Monoallelic mutation of CHEK2 gene in female patient   . Personal history of colonic polyps   . S/P partial colectomy     Past Surgical History:  Procedure Laterality Date  . BREAST LUMPECTOMY WITH RADIOACTIVE SEED AND SENTINEL LYMPH NODE BIOPSY Right 06/30/2019   Procedure: RIGHT BREAST LUMPECTOMY WITH RADIOACTIVE SEED AND RIGHT AXILLARY SENTINEL NODE BIOPSY;  Surgeon: Rolm Bookbinder, MD;  Location: [facility];  Service: General;  Laterality: Right;  PEC BLOCK  . BREAST SURGERY    . COLON SURGERY     Partial colectomy  . COLONOSCOPY    . POLYPECTOMY    . PORTACATH PLACEMENT Left 06/30/2019   Procedure: INSERTION PORT-A-CATH WITH ULTRASOUND GUIDANCE;  Surgeon: Rolm Bookbinder, MD;  Location: [facility];   Service: General;  Laterality: Left;     Current Outpatient Medications  Medication Sig Dispense Refill  . acetaminophen (TYLENOL) 325 MG tablet Take 650 mg by mouth every 6 (six) hours as needed for headache.    . escitalopram (LEXAPRO) 20 MG tablet Take 20 mg by mouth daily.     Marland Kitchen ibuprofen (ADVIL) 200 MG tablet Take 200 mg by mouth every 6 (six) hours as needed for headache or mild pain.    Marland Kitchen lidocaine-prilocaine (EMLA) cream Apply to affected area once 30 g 3  . LORazepam (ATIVAN) 0.5 MG tablet Take 1 tablet (0.5 mg total) by mouth every 8 (eight) hours. 30 tablet 0  . Multiple Vitamins-Minerals (CENTRUM WOMEN PO) Take 1 tablet by mouth daily.    . ondansetron (ZOFRAN) 8 MG tablet Take 1 tablet (8 mg total) by mouth 2 (two) times daily as needed (Nausea or vomiting). 30 tablet 1  . prochlorperazine (COMPAZINE) 10 MG tablet Take 1 tablet (10 mg total) by mouth every 6 (six) hours as needed (Nausea or vomiting). 30 tablet 1  . tamoxifen (NOLVADEX) 20 MG tablet Take 1 tablet (20 mg total) by mouth daily. 30 tablet 5   No current facility-administered medications for this visit.    Allergies:   Patient has no known allergies.    Social History:  The patient  reports that she has  been smoking. She has a 12.50 pack-year smoking history. She has never used smokeless tobacco. She reports that she does not drink alcohol and does not use drugs.   Family History:  The patient's family history includes Cancer in an other family member; Colon cancer (age of onset: 25) in her cousin; Colon cancer (age of onset: 82) in her maternal grandmother; Colon cancer (age of onset: 67) in her maternal grandfather.    ROS:  Please see the history of present illness.   Otherwise, review of systems are positive for none.   All other systems are reviewed and negative.    PHYSICAL EXAM: VS:  BP 130/70   Pulse 81   Temp (!) 96.8 F (36 C)   Ht '5\' 8"'$  (1.727 m)   Wt 181 lb 9.6 oz (82.4 kg)   SpO2 97%   BMI  27.61 kg/m  , BMI Body mass index is 27.61 kg/m. GENERAL:  Well appearing HEENT:  Pupils equal round and reactive, fundi not visualized, oral mucosa unremarkable NECK:  No jugular venous distention, waveform within normal limits, carotid upstroke brisk and symmetric, no bruits, no thyromegaly LYMPHATICS:  No cervical, inguinal adenopathy LUNGS:  Clear to auscultation bilaterally BACK:  No CVA tenderness CHEST:  Unremarkable HEART:  PMI not displaced or sustained,S1 and S2 within normal limits, no S3, no S4, no clicks, no rubs, no murmurs ABD:  Flat, positive bowel sounds normal in frequency in pitch, no bruits, no rebound, no guarding, no midline pulsatile mass, no hepatomegaly, no splenomegaly EXT:  2 plus pulses throughout, no edema, no cyanosis no clubbing SKIN:  No rashes no nodules NEURO:  Cranial nerves II through XII grossly intact, motor grossly intact throughout PSYCH:  Cognitively intact, oriented to person place and time    EKG:  EKG is ordered today. The ekg ordered today demonstrates sinus rhythm, rate 81, axis within normal limits, intervals within normal limits, RSR prime V1 and V2, no acute ST-T wave changes.   Recent Labs: 07/29/2019: ALT 16; BUN 12; Creatinine 0.82; Hemoglobin 14.9; Platelet Count 228; Potassium 4.0; Sodium 141    Lipid Panel No results found for: CHOL, TRIG, HDL, CHOLHDL, VLDL, LDLCALC, LDLDIRECT    Wt Readings from Last 3 Encounters:  08/02/19 181 lb 9.6 oz (82.4 kg)  07/29/19 183 lb 8 oz (83.2 kg)  07/28/19 180 lb 4.8 oz (81.8 kg)      Other studies Reviewed: Additional studies/ records that were reviewed today include: Oncology notes. Review of the above records demonstrates:  Please see elsewhere in the note.     ASSESSMENT AND PLAN:  HIGH RISK CHEMOTHERAPEUTIC: The patient is getting therapy as above.  I will schedule her for 3 months echocardiogram with a baseline have been normal.  I will be checking to check on her adjuvant  therapy which likely might necessitate follow-up echo cardiography after this.   ADDENDUM: She will be getting Herceptin for 12 months and will have follow-up echocardiography every 3 months.  TOBACCO ABUSE: She is not interested in talking about smoking cessation until the stress of this therapy is over.   Current medicines are reviewed at length with the patient today.  The patient does not have concerns regarding medicines.  The following changes have been made:  no change  Labs/ tests ordered today include:   Orders Placed This Encounter  Procedures  . EKG 12-Lead  . ECHOCARDIOGRAM COMPLETE     Disposition:   FU with me as needed.  I  will order echo as above.     Signed, Minus Breeding, MD  08/02/2019 5:16 PM    [place]

## 2019-08-01 NOTE — Progress Notes (Signed)
Patient called regarding qualifications for [employer].  Based on verbal income, she does not qualify due to being over requirements. Asked if she has met ded/OOP for insurance, she states she has. Advised there may have been copay assistance available if she had not.   She states she will still be on injection when her insurance renews. Advised her to keep my contact information to revisit copay assistance at that time.She verbalized understanding.

## 2019-08-01 NOTE — Telephone Encounter (Signed)
-----   Message from Lester [place], RN sent at 07/29/2019  4:18 PM EDT ----- Regarding: Dr. Burr Medico Pt. Received first time Trastuzumab and Abraxane tolerated without difficulties. Thank you!

## 2019-08-02 ENCOUNTER — Encounter: Payer: Self-pay | Admitting: Cardiology

## 2019-08-02 ENCOUNTER — Other Ambulatory Visit: Payer: Self-pay

## 2019-08-02 ENCOUNTER — Ambulatory Visit (INDEPENDENT_AMBULATORY_CARE_PROVIDER_SITE_OTHER): Payer: BC Managed Care – PPO | Admitting: Cardiology

## 2019-08-02 VITALS — BP 130/70 | HR 81 | Temp 96.8°F | Ht 68.0 in | Wt 181.6 lb

## 2019-08-02 DIAGNOSIS — R9431 Abnormal electrocardiogram [ECG] [EKG]: Secondary | ICD-10-CM

## 2019-08-02 DIAGNOSIS — Z79899 Other long term (current) drug therapy: Secondary | ICD-10-CM | POA: Diagnosis not present

## 2019-08-02 NOTE — Patient Instructions (Signed)
Medication Instructions:  NO CHANGES *If you need a refill on your cardiac medications before your next appointment, please call your pharmacy*   Lab Work:   Testing/Procedures:  WILL BE SCHEDULE AT [place] [address] Your physician has requested that you have an echocardiogram. Echocardiography is a painless test that uses sound waves to create images of your heart. It provides your doctor with information about the size and shape of your heart and how well your heart's chambers and valves are working. This procedure takes approximately one hour. There are no restrictions for this procedure.     Follow-Up: At [facility], you and your health needs are our priority.  As part of our continuing mission to provide you with exceptional heart care, we have created designated Provider Care Teams.  These Care Teams include your primary Cardiologist (physician) and Advanced Practice Providers (APPs -  Physician Assistants and Nurse Practitioners) who all work together to provide you with the care you need, when you need it.  We recommend signing up for the patient portal called "MyChart".  Sign up information is provided on this After Visit Summary.  MyChart is used to connect with patients for Virtual Visits (Telemedicine).  Patients are able to view lab/test results, encounter notes, upcoming appointments, etc.  Non-urgent messages can be sent to your provider as well.   To learn more about what you can do with MyChart, go to [email or web address].    Your next appointment:      AS NEEDED IF ECHO IS NORMAL   The format for your next appointment:   In Person  Provider:   Minus Breeding, MD

## 2019-08-02 NOTE — Addendum Note (Signed)
Addended by: Minus Breeding on: 08/02/2019 05:23 PM   Modules accepted: Level of Service

## 2019-08-03 ENCOUNTER — Other Ambulatory Visit: Payer: Self-pay | Admitting: Hematology

## 2019-08-03 NOTE — Progress Notes (Signed)
[place]   [phone] [phone]   Clinic Follow up Note   Patient Care Team: Patient, [name] as PCP - General ([place]) Minus Breeding, MD as PCP - Cardiology (Cardiology) Armbruster, Abigail Raspberry, MD as Consulting Physician (Gastroenterology) Abigail Berg as Counselor (Licensed Clinical Social Worker) Abigail Merle, MD as Consulting Physician (Hematology) Abigail Feeling, NP as Nurse Practitioner (Nurse Practitioner) Abigail Kaufmann, RN as Oncology Nurse Navigator Abigail Germany, RN as Oncology Nurse Navigator Abigail Bookbinder, MD as Consulting Physician (General Surgery) Abigail Kaufman, RN as Registered Nurse 08/04/2019  CHIEF COMPLAINT: Follow-up right breast cancer  SUMMARY OF ONCOLOGIC HISTORY: Oncology History Overview Note  Cancer Staging Malignant neoplasm of upper-outer quadrant of right breast in female, estrogen receptor positive (Abigail Berg) Staging form: Breast, AJCC 8th Edition - Clinical stage from 06/01/2019: Stage IA (cT1c, cN0, cM0, G3, ER+, PR-, HER2+) - Signed by Abigail Merle, MD on 06/17/2019 - Pathologic stage from 06/30/2019: Stage IA (pT1c, pN0, cM0, G3, ER+, PR-, HER2+) - Signed by Abigail Merle, MD on 07/15/2019    Malignant neoplasm of upper-outer quadrant of right breast in female, estrogen receptor positive ([place])  10/2018 -  Anti-estrogen oral therapy   She was preventively started on Tamoxifen since 10/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 Held after 06/17/19 to proceed with her breast cancer surgery and chemo.    05/06/2019 Breast MRI   IMPRESSION Enhancing right breast mass suspicious for malignancy. Second look [place] if recommended.  No Suspicious enhancing left breast masses.    05/24/2019 Breast US   IMPRESSION 1.8cm irregular right breast mass within the 9:30 position 3.5cm from the nipple is highly suggestive of malignancy. An Ultrasound guided breast biopsy is recommended. The findings of the study and  recommendation for biopsy were discussed with the patient directly.    06/01/2019 Cancer Staging   Staging form: Breast, AJCC 8th Edition - Clinical stage from 06/01/2019: Stage IA (cT1c, cN0, cM0, G3, ER+, PR-, HER2+) - Signed by Abigail Merle, MD on 06/17/2019   06/01/2019 Initial Biopsy   Breast Biopsy - Right Breast Core Bx DIAGNOSIS INFILTRATING DUCT CARCINOMA WITH MINOR DUCTAL CARCINOMA IN SITU COMPNENT OF RIGHT BREAST, CORE NEEDLE BIOPSY    06/01/2019 Receptors her2   ER: greater than 75% of tumor cells show moderate staining  PR: Negative  HER2: Positive 3+ Ki67: 40%   06/17/2019 Initial Diagnosis   Malignant neoplasm of upper-outer quadrant of right breast in female, estrogen receptor positive (Abigail Berg)   06/30/2019 Surgery   RIGHT BREAST LUMPECTOMY WITH RADIOACTIVE SEED AND RIGHT AXILLARY SENTINEL NODE BIOPSY and PAC placement  by Dr Abigail Berg     06/30/2019 Pathology Results   FINAL MICROSCOPIC DIAGNOSIS:   A. BREAST, RIGHT, LUMPECTOMY:  - Invasive ductal carcinoma, grade 3, spanning 1.5 cm.  - Intermediate grade ductal carcinoma in situ.  - Invasive carcinoma is 0.1-0.2 from the final lateral margin (part E).  - Margins are negative for in situ carcinoma.  - Biopsy site.  - See oncology table.   B. LYMPH NODE, RIGHT AXILLARY, SENTINEL, BIOPSY:  - One of one lymph nodes negative for carcinoma (0/1).   C. LYMPH NODE, RIGHT AXILLARY, SENTINEL, BIOPSY:  - One of one lymph nodes negative for carcinoma (0/1).   D. LYMPH NODE, RIGHT AXILLARY, SENTINEL, BIOPSY:  - One of one lymph nodes negative for carcinoma (0/1).   E. BREAST, RIGHT ADDITIONAL LATERAL MARGIN, EXCISION:  - Invasive ductal carcinoma.  - Invasive  carcinoma is 0.1-0.2 cm from the new lateral margin.   F. BREAST, RIGHT ADDITIONAL POSTERIOR MARGIN, EXCISION:  - Fibrocystic change and usual ductal hyperplasia.  - No malignancy identified.   G. BREAST, RIGHT ADDITIONAL SUPERIOR MARGIN, EXCISION:  - Fibrocystic  change and usual ductal hyperplasia.  - Incidental radial scar.  - No malignancy identified.     PROGNOSTIC INDICATOR RESULTS:  The tumor cells are POSITIVE for Her2 (3+).  Estrogen Receptor:       POSITIVE, 85%, MODERATE STAINING  Progesterone Receptor:   NEGATIVE  Proliferation Marker Ki-67:   25%    06/30/2019 Cancer Staging   Staging form: Breast, AJCC 8th Edition - Pathologic stage from 06/30/2019: Stage IA (pT1c, pN0, cM0, G3, ER+, PR-, HER2+) - Signed by Abigail Merle, MD on 07/15/2019   07/29/2019 -  Chemotherapy   The patient had PACLitaxel-protein bound (ABRAXANE) chemo infusion 200 mg, 100 mg/m2 = 200 mg (100 % of original dose 100 mg/m2), Intravenous,  Once, 1 of 3 cycles Dose modification: 100 mg/m2 (original dose 100 mg/m2, Cycle 1, Reason: Provider Judgment) Administration: 200 mg (07/29/2019) trastuzumab-dkst (OGIVRI) 336 mg in sodium chloride 0.9 % 250 mL chemo infusion, 4 mg/kg = 336 mg, Intravenous,  Once, 1 of 16 cycles Administration: 336 mg (07/29/2019)  for chemotherapy treatment.      CURRENT THERAPY:  Weekly Abraxane and trastuzumab x12 followed by maintenance trastuzumab, started 07/29/2019.  INTERVAL HISTORY: Ms. Abigail Berg returns for follow-up and treatment as scheduled.  She completed cycle 1 day 1 Abraxane and trastuzumab on 6/25.  She was seen by Dr. Donne Berg on 6/29 who felt she was healing well from breast surgery and noted an umbilical hernia.   Today she presents with a friend.  She feels well.  Starting on day 2 after treatment she was very tired with a bad headache that lasted all day.  When she woke up she "could not tell anything had been done."  She had a mild metal taste in her mouth, no mucositis.  Oral intake is adequate.  She had transient mild nausea and diarrhea x1.  No vomiting.  She did not require much antiemetics.  Denies fever, chills, cough, chest pain, dyspnea, body aches, neuropathy.    MEDICAL HISTORY:  Past Medical History:  Diagnosis  Date  . Depression   . Family history of colon cancer   . Monoallelic mutation of CHEK2 gene in female patient   . Personal history of colonic polyps   . S/P partial colectomy     SURGICAL HISTORY: Past Surgical History:  Procedure Laterality Date  . BREAST LUMPECTOMY WITH RADIOACTIVE SEED AND SENTINEL LYMPH NODE BIOPSY Right 06/30/2019   Procedure: RIGHT BREAST LUMPECTOMY WITH RADIOACTIVE SEED AND RIGHT AXILLARY SENTINEL NODE BIOPSY;  Surgeon: Abigail Bookbinder, MD;  Location: [place];  Service: General;  Laterality: Right;  PEC BLOCK  . BREAST SURGERY    . COLON SURGERY     Partial colectomy  . COLONOSCOPY    . POLYPECTOMY    . PORTACATH PLACEMENT Left 06/30/2019   Procedure: INSERTION PORT-A-CATH WITH ULTRASOUND GUIDANCE;  Surgeon: Abigail Bookbinder, MD;  Location: [place];  Service: General;  Laterality: Left;    I have reviewed the social history and family history with the patient and they are unchanged from previous note.  ALLERGIES:  has No Known Allergies.  MEDICATIONS:  Current Outpatient Medications  Medication Sig Dispense Refill  . acetaminophen (TYLENOL) 325 MG tablet Take 650 mg by  mouth every 6 (six) hours as needed for headache.    . escitalopram (LEXAPRO) 20 MG tablet Take 20 mg by mouth daily.     Marland Kitchen ibuprofen (ADVIL) 200 MG tablet Take 200 mg by mouth every 6 (six) hours as needed for headache or mild pain.    Marland Kitchen lidocaine-prilocaine (EMLA) cream Apply to affected area once 30 g 3  . LORazepam (ATIVAN) 0.5 MG tablet Take 1 tablet (0.5 mg total) by mouth every 8 (eight) hours. 30 tablet 0  . Multiple Vitamins-Minerals (CENTRUM WOMEN PO) Take 1 tablet by mouth daily.    . ondansetron (ZOFRAN) 8 MG tablet Take 1 tablet (8 mg total) by mouth 2 (two) times daily as needed (Nausea or vomiting). 30 tablet 1  . prochlorperazine (COMPAZINE) 10 MG tablet Take 1 tablet (10 mg total) by mouth every 6 (six) hours as needed (Nausea or  vomiting). 30 tablet 1   No current facility-administered medications for this visit.   Facility-Administered Medications Ordered in Other Visits  Medication Dose Route Frequency Provider Last Rate Last Admin  . heparin lock flush 100 unit/mL  500 Units Intracatheter Once PRN Abigail Merle, MD      . PACLitaxel-protein bound (ABRAXANE) chemo infusion 200 mg  100 mg/m2 (Treatment Plan Recorded) Intravenous Once Abigail Merle, MD      . sodium chloride flush (NS) 0.9 % injection 10 mL  10 mL Intracatheter PRN Abigail Merle, MD      . trastuzumab-dkst (OGIVRI) 168 mg in sodium chloride 0.9 % 250 mL chemo infusion  2 mg/kg (Treatment Plan Recorded) Intravenous Once Abigail Merle, MD        PHYSICAL EXAMINATION: ECOG PERFORMANCE STATUS: 1 - Symptomatic but completely ambulatory  Vitals:   08/04/19 0907  BP: 137/83  Pulse: 82  Resp: 18  Temp: 98.3 F (36.8 C)  SpO2: 98%   Filed Weights   08/04/19 0907  Weight: 181 lb 9.6 oz (82.4 kg)    GENERAL:alert, no distress and comfortable SKIN: No rash to exposed skin EYES: sclera clear NECK: Without mass LUNGS: clear with normal breathing effort HEART: regular rate & rhythm, no lower extremity edema ABDOMEN:abdomen soft, non-tender and normal bowel sounds NEURO: alert & oriented x 3 with fluent speech, no focal motor/sensory deficits PAC without erythema Breast exam deferred  LABORATORY DATA:  I have reviewed the data as listed CBC Latest Ref Rng & Units 08/04/2019 07/29/2019 07/15/2019  WBC 4.0 - 10.5 K/uL 5.1 6.0 7.0  Hemoglobin 12.0 - 15.0 g/dL 14.0 14.9 14.8  Hematocrit 36 - 46 % 40.4 43.5 44.2  Platelets 150 - 400 K/uL 199 228 222     CMP Latest Ref Rng & Units 08/04/2019 07/29/2019 07/15/2019  Glucose 70 - 99 mg/dL 115(H) 132(H) 103(H)  BUN 6 - 20 mg/dL _0 Creatinine 0.44 - 1.00 mg/dL 0.78 0.82 0.89  Sodium 135 - 145 mmol/L 139 141 142  Potassium 3.5 - 5.1 mmol/L 4.2 4.0 4.4  Chloride 98 - 111 mmol/L 108 109 107  CO2 22 - 32 mmol/L _1 Calcium 8.9 - 10.3 mg/dL 10.9(H) 10.1 10.9(H)  Total Protein 6.5 - 8.1 g/dL 7.0 6.4(L) 6.9  Total Bilirubin 0.3 - 1.2 mg/dL 0.5 0.3 0.2(L)  Alkaline Phos 38 - 126 U/L 87 80 75  AST 15 - 41 U/L _2 ALT 0 - 44 U/L _3 RADIOGRAPHIC STUDIES: I have personally reviewed the radiological images  as listed and agreed with the findings in the report. No results found.   ASSESSMENT & PLAN: Abigail Berg is a 54 y.o. female with    1.Malignant neoplasm of upper-outer quadrant of right breast,Stage1A, p(T1cN0M0), stage IA, ER+/PR-/HER2+, Grade3.  -She was diagnosed in 05/2019. She underwent right breast lumpectomy with SLNB by Dr Abigail Berg on 06/30/19.Pathology showed a 1.5 cm grade 3 invasive ductal carcinoma, ER positive, HER-2 positive (repeated), margins were all negative, lymph nodes were negative. -Bone scan negative for definitive bone metastasis. -Due to her HER2 positive breast cancer which is more aggressive and predicts high risk of recurrence, Dr. Burr Medico recommended adjuvant chemo with weekly Abraxane X12 with anti-HER2 Herceptin followed by maintenance Herceptin q3weeks to complete 1 year of treatment. -Her echocardiogram from Jun 24, 2019 showed normal EF -The goal of therapy is curative -began 07/28/19   2. 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 -previouslydiscussed that CHEK2 1100delC mutation is associated with 2-4 times high risk of breast cancer than general population, especially in people with strong family history of breast cancer(she only has 1 family member with breast cancer and 3 with colon cancer). Her estimated lifetime risk of breast cancer is probably around 25-35% -She will continueannual screening breast MRI in addition to mammogram,self breast exam, and physician breast exam twice a year.Her GYN will order for her to be done locally. -previously discussed  themoderately increasedrisk of colon cancer from CHEK2 mutation; she will f/u with Dr. Havery Moros for screening colonoscopies, last done 07/2018 with12polyps. Plan to repeat in next 2 years.  -She started chemoprevention withTamoxifen once daily in 10/2018. She has tolerated Tamoxifen moderately well but attributes recent Fatigue andmildnausea to Tamoxifen.Stopped on 06/17/19 to proceed with start of breast cancer treatment.  3. Health Maintenance, Smoking Cessation  -She willcontinue f/u with PCP, OB/Gyn for routine health maintenance and other age appropriate cancer screenings -She has been counseled on smoking cessation  4. S/p ileocecectomy on 08/14/15 due to benign colon polyp invading appendix, history of recurrent colon polyps  5.H/o right breast fibroadenoma,S/p rightsurgical resection on 11/22/15  6. Hypercalcemia, likely primary hyperparathyroidism -She is not on Calcium or Vit D, but takes MVI  -Her PTH was within normal limits, but on the high end.  PTHrp was negative. This is likely primary hyperparathyroidism -We will refer her to endocrinology after she completes adjuvant chemotherapy -CA 10.9 today, stable  7.  Umbilical hernia -Reducible, nontender -She discussed with Dr. Donne Berg, will consider surgerytreatment options after she completes adjuvant chemo   Disposition: Ms. Cleckley appears stable.  She completed cycle 1 day 1 Abraxane and trastuzumab.  She tolerated treatment well with moderate fatigue and headache, and by mild but well managed nausea and diarrhea on days 2-3.  She is able to recover very well.  Labs and weight are stable.  We reviewed symptom management.    She has a nontender reducible umbilical hernia.  She discussed this with Dr. Donne Berg.  The plan is to discuss surgical/treatment options after she completes adjuvant chemo.  She will proceed with cycle 1 day 8 (week 2) Abraxane and trastuzumab today as planned.  She will continue lab  and weekly Abraxane/trastuzumab to complete 12 cycles as planned.  Follow-up every other treatment for now.  All questions were answered. The patient knows to call the clinic with any problems, questions or concerns. No barriers to learning were detected.     Abigail Feeling, NP 08/04/19

## 2019-08-04 ENCOUNTER — Encounter: Payer: Self-pay | Admitting: Nurse Practitioner

## 2019-08-04 ENCOUNTER — Inpatient Hospital Stay: Payer: BC Managed Care – PPO

## 2019-08-04 ENCOUNTER — Encounter: Payer: Self-pay | Admitting: *Deleted

## 2019-08-04 ENCOUNTER — Encounter: Payer: Self-pay | Admitting: Radiology

## 2019-08-04 ENCOUNTER — Other Ambulatory Visit: Payer: Self-pay | Admitting: Medical

## 2019-08-04 ENCOUNTER — Other Ambulatory Visit: Payer: Self-pay

## 2019-08-04 ENCOUNTER — Inpatient Hospital Stay: Payer: BC Managed Care – PPO | Attending: Hematology | Admitting: Nurse Practitioner

## 2019-08-04 ENCOUNTER — Telehealth: Payer: Self-pay | Admitting: Nurse Practitioner

## 2019-08-04 VITALS — BP 137/83 | HR 82 | Temp 98.3°F | Resp 18 | Ht 68.0 in | Wt 181.6 lb

## 2019-08-04 DIAGNOSIS — C50411 Malignant neoplasm of upper-outer quadrant of right female breast: Secondary | ICD-10-CM | POA: Insufficient documentation

## 2019-08-04 DIAGNOSIS — K429 Umbilical hernia without obstruction or gangrene: Secondary | ICD-10-CM | POA: Diagnosis not present

## 2019-08-04 DIAGNOSIS — Z1501 Genetic susceptibility to malignant neoplasm of breast: Secondary | ICD-10-CM

## 2019-08-04 DIAGNOSIS — F17210 Nicotine dependence, cigarettes, uncomplicated: Secondary | ICD-10-CM | POA: Insufficient documentation

## 2019-08-04 DIAGNOSIS — Z170 Estrogen receptor positive status [ER+]: Secondary | ICD-10-CM | POA: Diagnosis not present

## 2019-08-04 DIAGNOSIS — L089 Local infection of the skin and subcutaneous tissue, unspecified: Secondary | ICD-10-CM | POA: Insufficient documentation

## 2019-08-04 DIAGNOSIS — Z5112 Encounter for antineoplastic immunotherapy: Secondary | ICD-10-CM | POA: Diagnosis not present

## 2019-08-04 DIAGNOSIS — Z1589 Genetic susceptibility to other disease: Secondary | ICD-10-CM

## 2019-08-04 DIAGNOSIS — Z5111 Encounter for antineoplastic chemotherapy: Secondary | ICD-10-CM | POA: Insufficient documentation

## 2019-08-04 DIAGNOSIS — Z1509 Genetic susceptibility to other malignant neoplasm: Secondary | ICD-10-CM

## 2019-08-04 DIAGNOSIS — Z1502 Genetic susceptibility to malignant neoplasm of ovary: Secondary | ICD-10-CM

## 2019-08-04 DIAGNOSIS — Z95828 Presence of other vascular implants and grafts: Secondary | ICD-10-CM

## 2019-08-04 DIAGNOSIS — R1909 Other intra-abdominal and pelvic swelling, mass and lump: Secondary | ICD-10-CM

## 2019-08-04 DIAGNOSIS — E213 Hyperparathyroidism, unspecified: Secondary | ICD-10-CM | POA: Diagnosis not present

## 2019-08-04 LAB — CBC WITH DIFFERENTIAL (CANCER CENTER ONLY)
Abs Immature Granulocytes: 0.01 10*3/uL (ref 0.00–0.07)
Basophils Absolute: 0 10*3/uL (ref 0.0–0.1)
Basophils Relative: 1 %
Eosinophils Absolute: 0.1 10*3/uL (ref 0.0–0.5)
Eosinophils Relative: 3 %
HCT: 40.4 % (ref 36.0–46.0)
Hemoglobin: 14 g/dL (ref 12.0–15.0)
Immature Granulocytes: 0 %
Lymphocytes Relative: 39 %
Lymphs Abs: 2 10*3/uL (ref 0.7–4.0)
MCH: 32.3 pg (ref 26.0–34.0)
MCHC: 34.7 g/dL (ref 30.0–36.0)
MCV: 93.1 fL (ref 80.0–100.0)
Monocytes Absolute: 0.2 10*3/uL (ref 0.1–1.0)
Monocytes Relative: 4 %
Neutro Abs: 2.7 10*3/uL (ref 1.7–7.7)
Neutrophils Relative %: 53 %
Platelet Count: 199 10*3/uL (ref 150–400)
RBC: 4.34 MIL/uL (ref 3.87–5.11)
RDW: 13.2 % (ref 11.5–15.5)
WBC Count: 5.1 10*3/uL (ref 4.0–10.5)
nRBC: 0 % (ref 0.0–0.2)

## 2019-08-04 LAB — CMP (CANCER CENTER ONLY)
ALT: 27 U/L (ref 0–44)
AST: 22 U/L (ref 15–41)
Albumin: 3.6 g/dL (ref 3.5–5.0)
Alkaline Phosphatase: 87 U/L (ref 38–126)
Anion gap: 9 (ref 5–15)
BUN: 15 mg/dL (ref 6–20)
CO2: 22 mmol/L (ref 22–32)
Calcium: 10.9 mg/dL — ABNORMAL HIGH (ref 8.9–10.3)
Chloride: 108 mmol/L (ref 98–111)
Creatinine: 0.78 mg/dL (ref 0.44–1.00)
GFR, Est AFR Am: >60 mL/min (ref >60)
GFR, Estimated: >60 mL/min (ref >60)
Glucose, Bld: 115 mg/dL — ABNORMAL HIGH (ref 70–99)
Potassium: 4.2 mmol/L (ref 3.5–5.1)
Sodium: 139 mmol/L (ref 135–145)
Total Bilirubin: 0.5 mg/dL (ref 0.3–1.2)
Total Protein: 7 g/dL (ref 6.5–8.1)

## 2019-08-04 MED ORDER — SODIUM CHLORIDE 0.9 % IV SOLN
Freq: Once | INTRAVENOUS | Status: AC
  Filled 2019-08-04: qty 250

## 2019-08-04 MED ORDER — PROCHLORPERAZINE MALEATE 10 MG PO TABS
10.0000 mg | ORAL_TABLET | Freq: Once | ORAL | Status: AC
  Administered 2019-08-04: 10 mg via ORAL

## 2019-08-04 MED ORDER — DIPHENHYDRAMINE HCL 50 MG/ML IJ SOLN
50.0000 mg | Freq: Once | INTRAMUSCULAR | Status: AC
  Administered 2019-08-04: 50 mg via INTRAVENOUS

## 2019-08-04 MED ORDER — HEPARIN SOD (PORK) LOCK FLUSH 100 UNIT/ML IV SOLN
500.0000 [IU] | Freq: Once | INTRAVENOUS | Status: AC | PRN
  Administered 2019-08-04: 500 [IU]
  Filled 2019-08-04: qty 5

## 2019-08-04 MED ORDER — ACETAMINOPHEN 325 MG PO TABS
ORAL_TABLET | ORAL | Status: AC
  Filled 2019-08-04: qty 2

## 2019-08-04 MED ORDER — SODIUM CHLORIDE 0.9% FLUSH
10.0000 mL | Freq: Once | INTRAVENOUS | Status: AC
  Administered 2019-08-04: 10 mL
  Filled 2019-08-04: qty 10

## 2019-08-04 MED ORDER — PROCHLORPERAZINE MALEATE 10 MG PO TABS
ORAL_TABLET | ORAL | Status: AC
  Filled 2019-08-04: qty 1

## 2019-08-04 MED ORDER — SODIUM CHLORIDE 0.9% FLUSH
10.0000 mL | INTRAVENOUS | Status: DC | PRN
  Administered 2019-08-04: 10 mL
  Filled 2019-08-04: qty 10

## 2019-08-04 MED ORDER — ACETAMINOPHEN 325 MG PO TABS
650.0000 mg | ORAL_TABLET | Freq: Once | ORAL | Status: AC
  Administered 2019-08-04: 650 mg via ORAL

## 2019-08-04 MED ORDER — PACLITAXEL PROTEIN-BOUND CHEMO INJECTION 100 MG
100.0000 mg/m2 | Freq: Once | INTRAVENOUS | Status: AC
  Administered 2019-08-04: 200 mg via INTRAVENOUS
  Filled 2019-08-04: qty 40

## 2019-08-04 MED ORDER — TRASTUZUMAB-DKST CHEMO 150 MG IV SOLR
2.0000 mg/kg | Freq: Once | INTRAVENOUS | Status: AC
  Administered 2019-08-04: 168 mg via INTRAVENOUS
  Filled 2019-08-04: qty 8

## 2019-08-04 MED ORDER — DIPHENHYDRAMINE HCL 50 MG/ML IJ SOLN
INTRAMUSCULAR | Status: AC
  Filled 2019-08-04: qty 1

## 2019-08-04 NOTE — Research (Signed)
[id number], A PROSPECTIVE OBSERVATIONAL COHORT STUDY TO DEVELOP A PREDICTIVE MODEL OFTAXANE-INDUCED PERIPHERAL NEUROPATHY IN CANCER PATIENTS.  08/04/2019 10:15AM  VISIT: Met with [name] and asked how she was feeling since her last treatment. She stated that she felt much better than anticipated. She had mild nausea, a headache that went away with Tylenol, and just all around tiredness. She said that she was much less anxious about this infusion and felt that things were going well overall. Updated Mellina about her eligibility for the [phone] study while she was receiving her infusion. Informed Dequita that due to a change in her infusion regimen (change from Paclitaxel to Abraxane), she is no longer eligible for the study. She stated her understanding and did not have any questions at this time and was appreciative of the update. I thanked her for her time and understanding and also for being willing to participate in the study.   E-mailed [id number] study to verify next steps. Per Gwendolyn Fill e-mail: "If the patient has received Abraxane and is no longer planning to receive any of the study-approved taxane regimens, then this patient meets the Criteria for Removal in Section 7.10c, which states "Patient receives another chemotherapy regimen and does not receive at least one cycle of one of the study-approved taxane regimens included in Appendix 18.1."" An Off Protocol Notice in [place] will be completed as requested in e-mail. A copy of this e-mail was saved in the I-drive.   Dr. Burr Medico was notified that patient is no longer eligible to continue on study and thanked for her referral.   Carol Ada, RT(R)(T) Clinical Research Coordinator 08/04/2019 10:38AM

## 2019-08-04 NOTE — Telephone Encounter (Signed)
Scheduled per 7/1 los. Noted to give pt updated calendar

## 2019-08-04 NOTE — Patient Instructions (Signed)
[place] Discharge Instructions for Patients Receiving Chemotherapy  Today you received the following chemotherapy agents Paclitaxel-protein (ABRAXANE) & Trastuzumab (OGIVRI).  To help prevent nausea and vomiting after your treatment, we encourage you to take your nausea medication as prescribed.  If you develop nausea and vomiting that is not controlled by your nausea medication, call the clinic.   BELOW ARE SYMPTOMS THAT SHOULD BE REPORTED IMMEDIATELY:  *FEVER GREATER THAN 100.5 F  *CHILLS WITH OR WITHOUT FEVER  NAUSEA AND VOMITING THAT IS NOT CONTROLLED WITH YOUR NAUSEA MEDICATION  *UNUSUAL SHORTNESS OF BREATH  *UNUSUAL BRUISING OR BLEEDING  TENDERNESS IN MOUTH AND THROAT WITH OR WITHOUT PRESENCE OF ULCERS  *URINARY PROBLEMS  *BOWEL PROBLEMS  UNUSUAL RASH Items with * indicate a potential emergency and should be followed up as soon as possible.  Feel free to call the clinic should you have any questions or concerns. The clinic phone number is [phone].  Please show the [place] at check-in to the Emergency Department and triage nurse.

## 2019-08-09 ENCOUNTER — Other Ambulatory Visit: Payer: Self-pay

## 2019-08-11 ENCOUNTER — Other Ambulatory Visit: Payer: BC Managed Care – PPO

## 2019-08-11 ENCOUNTER — Ambulatory Visit (HOSPITAL_COMMUNITY): Admission: RE | Admit: 2019-08-11 | Payer: BC Managed Care – PPO | Source: Ambulatory Visit

## 2019-08-11 ENCOUNTER — Inpatient Hospital Stay: Payer: BC Managed Care – PPO

## 2019-08-11 ENCOUNTER — Ambulatory Visit: Payer: BC Managed Care – PPO

## 2019-08-11 ENCOUNTER — Other Ambulatory Visit: Payer: Self-pay

## 2019-08-11 VITALS — BP 125/73 | HR 71 | Temp 97.5°F | Resp 17 | Wt 180.0 lb

## 2019-08-11 DIAGNOSIS — C50411 Malignant neoplasm of upper-outer quadrant of right female breast: Secondary | ICD-10-CM | POA: Diagnosis not present

## 2019-08-11 DIAGNOSIS — L089 Local infection of the skin and subcutaneous tissue, unspecified: Secondary | ICD-10-CM | POA: Diagnosis not present

## 2019-08-11 DIAGNOSIS — Z5111 Encounter for antineoplastic chemotherapy: Secondary | ICD-10-CM | POA: Diagnosis not present

## 2019-08-11 DIAGNOSIS — E213 Hyperparathyroidism, unspecified: Secondary | ICD-10-CM | POA: Diagnosis not present

## 2019-08-11 DIAGNOSIS — Z5112 Encounter for antineoplastic immunotherapy: Secondary | ICD-10-CM | POA: Diagnosis not present

## 2019-08-11 DIAGNOSIS — Z1589 Genetic susceptibility to other disease: Secondary | ICD-10-CM

## 2019-08-11 DIAGNOSIS — Z95828 Presence of other vascular implants and grafts: Secondary | ICD-10-CM

## 2019-08-11 DIAGNOSIS — Z170 Estrogen receptor positive status [ER+]: Secondary | ICD-10-CM | POA: Diagnosis not present

## 2019-08-11 DIAGNOSIS — F17210 Nicotine dependence, cigarettes, uncomplicated: Secondary | ICD-10-CM | POA: Diagnosis not present

## 2019-08-11 DIAGNOSIS — K429 Umbilical hernia without obstruction or gangrene: Secondary | ICD-10-CM | POA: Diagnosis not present

## 2019-08-11 DIAGNOSIS — Z1501 Genetic susceptibility to malignant neoplasm of breast: Secondary | ICD-10-CM

## 2019-08-11 LAB — CBC WITH DIFFERENTIAL (CANCER CENTER ONLY)
Abs Immature Granulocytes: 0.01 10*3/uL (ref 0.00–0.07)
Basophils Absolute: 0 10*3/uL (ref 0.0–0.1)
Basophils Relative: 0 %
Eosinophils Absolute: 0.1 10*3/uL (ref 0.0–0.5)
Eosinophils Relative: 3 %
HCT: 37.3 % (ref 36.0–46.0)
Hemoglobin: 12.9 g/dL (ref 12.0–15.0)
Immature Granulocytes: 0 %
Lymphocytes Relative: 49 %
Lymphs Abs: 2.2 10*3/uL (ref 0.7–4.0)
MCH: 32 pg (ref 26.0–34.0)
MCHC: 34.6 g/dL (ref 30.0–36.0)
MCV: 92.6 fL (ref 80.0–100.0)
Monocytes Absolute: 0.3 10*3/uL (ref 0.1–1.0)
Monocytes Relative: 7 %
Neutro Abs: 1.9 10*3/uL (ref 1.7–7.7)
Neutrophils Relative %: 41 %
Platelet Count: 256 10*3/uL (ref 150–400)
RBC: 4.03 MIL/uL (ref 3.87–5.11)
RDW: 13.5 % (ref 11.5–15.5)
WBC Count: 4.6 10*3/uL (ref 4.0–10.5)
nRBC: 0 % (ref 0.0–0.2)

## 2019-08-11 LAB — CMP (CANCER CENTER ONLY)
ALT: 22 U/L (ref 0–44)
AST: 17 U/L (ref 15–41)
Albumin: 3.6 g/dL (ref 3.5–5.0)
Alkaline Phosphatase: 69 U/L (ref 38–126)
Anion gap: 6 (ref 5–15)
BUN: 14 mg/dL (ref 6–20)
CO2: 25 mmol/L (ref 22–32)
Calcium: 10.6 mg/dL — ABNORMAL HIGH (ref 8.9–10.3)
Chloride: 108 mmol/L (ref 98–111)
Creatinine: 0.77 mg/dL (ref 0.44–1.00)
GFR, Est AFR Am: >60 mL/min (ref >60)
GFR, Estimated: >60 mL/min (ref >60)
Glucose, Bld: 96 mg/dL (ref 70–99)
Potassium: 4 mmol/L (ref 3.5–5.1)
Sodium: 139 mmol/L (ref 135–145)
Total Bilirubin: 0.3 mg/dL (ref 0.3–1.2)
Total Protein: 6.5 g/dL (ref 6.5–8.1)

## 2019-08-11 MED ORDER — SODIUM CHLORIDE 0.9 % IV SOLN
Freq: Once | INTRAVENOUS | Status: AC
  Filled 2019-08-11: qty 250

## 2019-08-11 MED ORDER — PROCHLORPERAZINE MALEATE 10 MG PO TABS
ORAL_TABLET | ORAL | Status: AC
  Filled 2019-08-11: qty 1

## 2019-08-11 MED ORDER — ACETAMINOPHEN 325 MG PO TABS
ORAL_TABLET | ORAL | Status: AC
  Filled 2019-08-11: qty 2

## 2019-08-11 MED ORDER — PACLITAXEL PROTEIN-BOUND CHEMO INJECTION 100 MG
100.0000 mg/m2 | Freq: Once | INTRAVENOUS | Status: AC
  Administered 2019-08-11: 200 mg via INTRAVENOUS
  Filled 2019-08-11: qty 40

## 2019-08-11 MED ORDER — HEPARIN SOD (PORK) LOCK FLUSH 100 UNIT/ML IV SOLN
500.0000 [IU] | Freq: Once | INTRAVENOUS | Status: AC | PRN
  Administered 2019-08-11: 500 [IU]
  Filled 2019-08-11: qty 5

## 2019-08-11 MED ORDER — DIPHENHYDRAMINE HCL 50 MG/ML IJ SOLN
INTRAMUSCULAR | Status: AC
  Filled 2019-08-11: qty 1

## 2019-08-11 MED ORDER — ACETAMINOPHEN 325 MG PO TABS
650.0000 mg | ORAL_TABLET | Freq: Once | ORAL | Status: AC
  Administered 2019-08-11: 650 mg via ORAL

## 2019-08-11 MED ORDER — TRASTUZUMAB-DKST CHEMO 150 MG IV SOLR
2.0000 mg/kg | Freq: Once | INTRAVENOUS | Status: AC
  Administered 2019-08-11: 168 mg via INTRAVENOUS
  Filled 2019-08-11: qty 8

## 2019-08-11 MED ORDER — SODIUM CHLORIDE 0.9% FLUSH
10.0000 mL | INTRAVENOUS | Status: DC | PRN
  Administered 2019-08-11: 10 mL
  Filled 2019-08-11: qty 10

## 2019-08-11 MED ORDER — PROCHLORPERAZINE MALEATE 10 MG PO TABS
10.0000 mg | ORAL_TABLET | Freq: Once | ORAL | Status: AC
  Administered 2019-08-11: 10 mg via ORAL

## 2019-08-11 MED ORDER — DIPHENHYDRAMINE HCL 50 MG/ML IJ SOLN
50.0000 mg | Freq: Once | INTRAMUSCULAR | Status: AC
  Administered 2019-08-11: 50 mg via INTRAVENOUS

## 2019-08-11 MED ORDER — SODIUM CHLORIDE 0.9% FLUSH
10.0000 mL | Freq: Once | INTRAVENOUS | Status: AC
  Administered 2019-08-11: 10 mL
  Filled 2019-08-11: qty 10

## 2019-08-11 NOTE — Patient Instructions (Signed)
[place] Discharge Instructions for Patients Receiving Chemotherapy  Today you received the following chemotherapy agents Trastuzumab (OGIVRI) & Paclitaxel-protein (ABRAXANE).  To help prevent nausea and vomiting after your treatment, we encourage you to take your nausea medication as prescribed.   If you develop nausea and vomiting that is not controlled by your nausea medication, call the clinic.   BELOW ARE SYMPTOMS THAT SHOULD BE REPORTED IMMEDIATELY:  *FEVER GREATER THAN 100.5 F  *CHILLS WITH OR WITHOUT FEVER  NAUSEA AND VOMITING THAT IS NOT CONTROLLED WITH YOUR NAUSEA MEDICATION  *UNUSUAL SHORTNESS OF BREATH  *UNUSUAL BRUISING OR BLEEDING  TENDERNESS IN MOUTH AND THROAT WITH OR WITHOUT PRESENCE OF ULCERS  *URINARY PROBLEMS  *BOWEL PROBLEMS  UNUSUAL RASH Items with * indicate a potential emergency and should be followed up as soon as possible.  Feel free to call the clinic should you have any questions or concerns. The clinic phone number is [phone].  Please show the [facility] at check-in to the Emergency Department and triage nurse.

## 2019-08-16 DIAGNOSIS — K469 Unspecified abdominal hernia without obstruction or gangrene: Secondary | ICD-10-CM | POA: Diagnosis not present

## 2019-08-17 NOTE — Progress Notes (Signed)
Abigail Berg   [phone] [phone]   Clinic Follow up Note   Patient Care Team: Patient, [name] as PCP - General ([place]) Minus Breeding, MD as PCP - Cardiology (Cardiology) Armbruster, Carlota Raspberry, MD as Consulting Physician (Gastroenterology) Eyvonne Mechanic as Counselor (Licensed Clinical Social Worker) Truitt Merle, MD as Consulting Physician (Hematology) Alla Feeling, NP as Nurse Practitioner (Nurse Practitioner) Mauro Kaufmann, RN as Oncology Nurse Navigator Rockwell Germany, RN as Oncology Nurse Navigator Rolm Bookbinder, MD as Consulting Physician (General Surgery)  Date of Service:  08/18/2019  CHIEF COMPLAINT: F/u of Right breast cancer, CHEK2 mutation  SUMMARY OF ONCOLOGIC HISTORY: Oncology History Overview Note  Cancer Staging Malignant neoplasm of upper-outer quadrant of right breast in female, estrogen receptor positive ([place]) Staging form: Breast, AJCC 8th Edition - Clinical stage from 06/01/2019: Stage IA (cT1c, cN0, cM0, G3, ER+, PR-, HER2+) - Signed by Truitt Merle, MD on 06/17/2019 - Pathologic stage from 06/30/2019: Stage IA (pT1c, pN0, cM0, G3, ER+, PR-, HER2+) - Signed by Truitt Merle, MD on 07/15/2019    Malignant neoplasm of upper-outer quadrant of right breast in female, estrogen receptor positive ([place])  10/2018 -  Anti-estrogen oral therapy   She was preventively started on Tamoxifen since 10/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 Held after 06/17/19 to proceed with her breast cancer surgery and chemo.    05/06/2019 Breast MRI   IMPRESSION Enhancing right breast mass suspicious for malignancy. Second look [place] if recommended.  No Suspicious enhancing left breast masses.    05/24/2019 Breast US   IMPRESSION 1.8cm irregular right breast mass within the 9:30 position 3.5cm from the nipple is highly suggestive of malignancy. An Ultrasound guided breast biopsy is recommended. The findings of the study and recommendation for  biopsy were discussed with the patient directly.    06/01/2019 Cancer Staging   Staging form: Breast, AJCC 8th Edition - Clinical stage from 06/01/2019: Stage IA (cT1c, cN0, cM0, G3, ER+, PR-, HER2+) - Signed by Truitt Merle, MD on 06/17/2019   06/01/2019 Initial Biopsy   Breast Biopsy - Right Breast Core Bx DIAGNOSIS INFILTRATING DUCT CARCINOMA WITH MINOR DUCTAL CARCINOMA IN SITU COMPNENT OF RIGHT BREAST, CORE NEEDLE BIOPSY    06/01/2019 Receptors her2   ER: greater than 75% of tumor cells show moderate staining  PR: Negative  HER2: Positive 3+ Ki67: 40%   06/17/2019 Initial Diagnosis   Malignant neoplasm of upper-outer quadrant of right breast in female, estrogen receptor positive ([place])   06/30/2019 Surgery   RIGHT BREAST LUMPECTOMY WITH RADIOACTIVE SEED AND RIGHT AXILLARY SENTINEL NODE BIOPSY and PAC placement  by Dr Donne Hazel     06/30/2019 Pathology Results   FINAL MICROSCOPIC DIAGNOSIS:   A. BREAST, RIGHT, LUMPECTOMY:  - Invasive ductal carcinoma, grade 3, spanning 1.5 cm.  - Intermediate grade ductal carcinoma in situ.  - Invasive carcinoma is 0.1-0.2 from the final lateral margin (part E).  - Margins are negative for in situ carcinoma.  - Biopsy site.  - See oncology table.   B. LYMPH NODE, RIGHT AXILLARY, SENTINEL, BIOPSY:  - One of one lymph nodes negative for carcinoma (0/1).   C. LYMPH NODE, RIGHT AXILLARY, SENTINEL, BIOPSY:  - One of one lymph nodes negative for carcinoma (0/1).   D. LYMPH NODE, RIGHT AXILLARY, SENTINEL, BIOPSY:  - One of one lymph nodes negative for carcinoma (0/1).   E. BREAST, RIGHT ADDITIONAL LATERAL MARGIN, EXCISION:  - Invasive ductal carcinoma.  -  Invasive carcinoma is 0.1-0.2 cm from the new lateral margin.   F. BREAST, RIGHT ADDITIONAL POSTERIOR MARGIN, EXCISION:  - Fibrocystic change and usual ductal hyperplasia.  - No malignancy identified.   G. BREAST, RIGHT ADDITIONAL SUPERIOR MARGIN, EXCISION:  - Fibrocystic change and usual ductal  hyperplasia.  - Incidental radial scar.  - No malignancy identified.     PROGNOSTIC INDICATOR RESULTS:  The tumor cells are POSITIVE for Her2 (3+).  Estrogen Receptor:       POSITIVE, 85%, MODERATE STAINING  Progesterone Receptor:   NEGATIVE  Proliferation Marker Ki-67:   25%    06/30/2019 Cancer Staging   Staging form: Breast, AJCC 8th Edition - Pathologic stage from 06/30/2019: Stage IA (pT1c, pN0, cM0, G3, ER+, PR-, HER2+) - Signed by Truitt Merle, MD on 07/15/2019   07/29/2019 -  Chemotherapy   Adjuvant Weekly Abraxane and Herceptin starting 07/29/19, followed by maintenance Herceptin q3weeks to complete 1 year of treatment.       CURRENT THERAPY:  Adjuvant Weekly Abraxane and Herceptin starting 07/29/19, followed by maintenance Herceptin q3weeks to complete 1 year of treatment.   INTERVAL HISTORY:  Abigail Berg is here for a follow up and treatment. She presents to the clinic with her family member. She notes she tolerated chemo so far most well. She notes she only vomited once, 4 days after her infusions. She has skin infection of right cheek after developing right neck nodule. She notes white pus output when she squeezes on her skin. She notes her voice has been hoarse during chemo. She notes 3 days ago her hair started falling out in clumps. She notes she has gotten 2 wigs to wear moving forward. She denies neuropathy and continues to use cryotherapy.  She was recently seen by Dr Donne Hazel who ordered her a CT AP wo contrast in [place]. She has results with her. He notes he was willing to operate on her hernia between before or after Radiation.  She notes her last period was May 2021.     REVIEW OF SYSTEMS:   Constitutional: Denies fevers, chills or abnormal weight loss Eyes: Denies blurriness of vision Ears, nose, mouth, throat, and face: Denies mucositis or sore throat (+) Hoarse throat  Respiratory: Denies cough, dyspnea or wheezes Cardiovascular: Denies palpitation, chest  discomfort or lower extremity swelling  Gastrointestinal:  Denies nausea, heartburn or change in bowel habits Skin: Denies abnormal skin rashes (+) Mild skin infection of face, mainly right cheek (+) hair loss  Lymphatics: Denies new lymphadenopathy or easy bruising (+) Right neck nodule  Neurological:Denies numbness, tingling or new weaknesses Behavioral/Psych: Mood is stable, no new changes  All other systems were reviewed with the patient and are negative.  MEDICAL HISTORY:  Past Medical History:  Diagnosis Date  . Depression   . Family history of colon cancer   . Monoallelic mutation of CHEK2 gene in female patient   . Personal history of colonic polyps   . S/P partial colectomy     SURGICAL HISTORY: Past Surgical History:  Procedure Laterality Date  . BREAST LUMPECTOMY WITH RADIOACTIVE SEED AND SENTINEL LYMPH NODE BIOPSY Right 06/30/2019   Procedure: RIGHT BREAST LUMPECTOMY WITH RADIOACTIVE SEED AND RIGHT AXILLARY SENTINEL NODE BIOPSY;  Surgeon: Rolm Bookbinder, MD;  Location: [facility];  Service: General;  Laterality: Right;  PEC BLOCK  . BREAST SURGERY    . COLON SURGERY     Partial colectomy  . COLONOSCOPY    . POLYPECTOMY    .  PORTACATH PLACEMENT Left 06/30/2019   Procedure: INSERTION PORT-A-CATH WITH ULTRASOUND GUIDANCE;  Surgeon: Rolm Bookbinder, MD;  Location: [facility];  Service: General;  Laterality: Left;    I have reviewed the social history and family history with the patient and they are unchanged from previous note.  ALLERGIES:  has No Known Allergies.  MEDICATIONS:  Current Outpatient Medications  Medication Sig Dispense Refill  . acetaminophen (TYLENOL) 325 MG tablet Take 650 mg by mouth every 6 (six) hours as needed for headache.    Marland Kitchen amoxicillin-clavulanate (AUGMENTIN) 875-125 MG tablet Take 1 tablet by mouth 2 (two) times daily. 14 tablet 0  . escitalopram (LEXAPRO) 20 MG tablet Take 20 mg by mouth daily.     Marland Kitchen  ibuprofen (ADVIL) 200 MG tablet Take 200 mg by mouth every 6 (six) hours as needed for headache or mild pain.    Marland Kitchen LORazepam (ATIVAN) 0.5 MG tablet Take 1 tablet (0.5 mg total) by mouth every 8 (eight) hours. 30 tablet 0  . Multiple Vitamins-Minerals (CENTRUM WOMEN PO) Take 1 tablet by mouth daily.    . ondansetron (ZOFRAN) 8 MG tablet Take 1 tablet (8 mg total) by mouth 2 (two) times daily as needed (Nausea or vomiting). 30 tablet 1  . prochlorperazine (COMPAZINE) 10 MG tablet Take 1 tablet (10 mg total) by mouth every 6 (six) hours as needed (Nausea or vomiting). 30 tablet 1  . lidocaine-prilocaine (EMLA) cream Apply to affected area once 30 g 3   No current facility-administered medications for this visit.    PHYSICAL EXAMINATION: ECOG PERFORMANCE STATUS: 1 - Symptomatic but completely ambulatory  Vitals:   08/18/19 1347  BP: 127/71  Pulse: 77  Resp: 17  Temp: 98.1 F (36.7 C)  SpO2: 100%   Filed Weights   08/18/19 1347  Weight: 182 lb 9.6 oz (82.8 kg)    GENERAL:alert, no distress and comfortable SKIN: skin color, texture, turgor are normal, no rashes or significant lesions (+) a 2cm skin erythema with central hypopigmentation and black head at right nasolabial fold, no dishcarge  EYES: normal, Conjunctiva are pink and non-injected, sclera clear OROPHARYNX:no exudate, no erythema and lips, buccal mucosa, and tongue normal NECK: supple, thyroid normal size, non-tender, without nodularity  LYMPH:  no palpable lymphadenopathy in the axillary (+) Palpable cervical LNs LUNGS: clear to auscultation and percussion with normal breathing effort HEART: regular rate & rhythm and no murmurs and no lower extremity edema ABDOMEN:abdomen soft, non-tender and normal bowel sounds (+) old surgical scar in mid abdomen, she does have mild hernia above umbilical  Musculoskeletal:no cyanosis of digits and no clubbing  NEURO: alert & oriented x 3 with fluent speech, no focal motor/sensory  deficits BREAST: (+) Right lumpectomy: Surgical incision healed well No palpable mass, nodules or adenopathy bilaterally. Breast exam benign.   LABORATORY DATA:  I have reviewed the data as listed CBC Latest Ref Rng & Units 08/18/2019 08/11/2019 08/04/2019  WBC 4.0 - 10.5 K/uL 5.7 4.6 5.1  Hemoglobin 12.0 - 15.0 g/dL 12.9 12.9 14.0  Hematocrit 36 - 46 % 37.4 37.3 40.4  Platelets 150 - 400 K/uL 295 256 199     CMP Latest Ref Rng & Units 08/18/2019 08/11/2019 08/04/2019  Glucose 70 - 99 mg/dL 100(H) 96 115(H)  BUN 6 - 20 mg/dL '11 14 15  '$ Creatinine 0.44 - 1.00 mg/dL 0.76 0.77 0.78  Sodium 135 - 145 mmol/L 141 139 139  Potassium 3.5 - 5.1 mmol/L 4.2 4.0 4.2  Chloride  98 - 111 mmol/L 110 108 108  CO2 22 - 32 mmol/L '24 25 22  '$ Calcium 8.9 - 10.3 mg/dL 10.9(H) 10.6(H) 10.9(H)  Total Protein 6.5 - 8.1 g/dL 6.7 6.5 7.0  Total Bilirubin 0.3 - 1.2 mg/dL 0.2(L) 0.3 0.5  Alkaline Phos 38 - 126 U/L 79 69 87  AST 15 - 41 U/L '18 17 22  '$ ALT 0 - 44 U/L '21 22 27      '$ RADIOGRAPHIC STUDIES: I have personally reviewed the radiological images as listed and agreed with the findings in the report. No results found.   ASSESSMENT & PLAN:  Marieta Markov is a 54 y.o. female with    1.Malignant neoplasm of upper-outer quadrant of right breast,Stage1A, p(T1cN0M0), stage IA, ER+/PR-/HER2+, Grade3.  -She was diagnosed in 05/2019. She underwent right breast lumpectomy with SLNB by Dr Donne Hazel on 06/30/19. -I previously discussed her HER-2 positive breast cancer is more aggressive, and predicts high risk of recurrence.  -To reduce her high risk of recurrence, I started her on chemotherapy with weekly Abraxane and Anti-HER2 Herceptin for 12 weeks beginning 07/29/19. Afterward she will proceed with maintenance Herceptin q3weeks to complete 1 year of treatment. Goal of therapy is curative.  -S/p week 3 she has hoarse voice, skin infection on face and an episode of N&V and fatigue. She has recovered well. She does not have  neuropathy so far, will continue cryotherapy.  -On exam today she has palpable right cervical LNs and small umbilical hernia. She had history of colon surgery. Will monitor her LNs and hernia.  -Labs reviewed and overall adequate to proceed with week 4 Abraxane and Herceptin today and continue weekly.  -F/u in 2 weeks.   2. 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 -Wepreviouslydiscussed that CHEK2 1100delC mutation is associated with 2-4 times high risk of breast cancer than general population and her moderate increased risk of colon cancer. Her estimated lifetime risk of breast cancer is probably around 25-35% -She will continueannual screening breast MRI in addition to mammogram,self breast exam, and physician breast exam twice a year.Her GYN will order for her to be done locally. -She will f/u with Dr. Havery Moros for screening colonoscopies, last done 07/2018 with12polyps. I recommend to repeat in next 2 years.  -She started chemoprevention withTamoxifen once daily in 10/2018, but stopped on 06/17/19 to proceed with start of breast cancer treatment.   3. Health Maintenance, Smoking Cessation  -She willcontinue f/u with PCP, OB/Gyn for routine health maintenance and other age appropriate cancer screenings -Wepreviouslydiscussed maintaining a healthy lifestyle. -She has been smoking for 25 years. She is currently smoking 1/2ppd. She has mild elevated Hg and can negatively effect her overall health. I again reviewedsmoking cessation.   4. S/p ileocecectomy on 08/14/15 due to benign colon polyp invading appendix, history of recurrent colon polyps  5.H/o right breast fibroadenoma,S/p rightsurgical resection on 11/22/15  6. Hypercalcemia, likely primary hyperparathyroidism -She is not on Calcium or Vit D, but takes MVI  -Her PTH was within normal limits, but on the high end.  PTHrp was negative. This  is likely primary hyperparathyroidism -Will refer her to endocrinology after she completes adjuvant chemotherapy -I recommend her to take a multivitamin which contains low-dose calcium  7. Skin infection  -She developed a skin infection of her face, mainly at right nasolabial fold, S/p C3 chemo -I encouraged her to keep skin clean and I will call in Augmentin today (08/18/19)  8. Umbilical Hernia  -She has had a hernia for a few months now. Her recent CT AP showed incidental findings of Hernia above umbilical that contains fat and small b/l kidney stones along with ovarian cyst.  -She has seen Dr Donne Hazel who will do hernia repair before or after Radiation.    PLAN: -Labs reviewed and adequate to proceed with week 4 Abraxane and Herceptin today  -Lab, flush and Abraxane/Herceptin weekly -F/u in 2 and 4 weeks  -I called in Augmentin for her today  -Will request her CT AP scan from [facility]    No problem-specific Assessment & Plan notes found for this encounter.   No orders of the defined types were placed in this encounter.  All questions were answered. The patient knows to call the clinic with any problems, questions or concerns. No barriers to learning was detected. The total time spent in the appointment was 30 minutes.     Truitt Merle, MD 08/18/2019   I, Joslyn Devon, am acting as scribe for Truitt Merle, MD.   I have reviewed the above documentation for accuracy and completeness, and I agree with the above.

## 2019-08-18 ENCOUNTER — Encounter: Payer: Self-pay | Admitting: Hematology

## 2019-08-18 ENCOUNTER — Inpatient Hospital Stay: Payer: BC Managed Care – PPO

## 2019-08-18 ENCOUNTER — Inpatient Hospital Stay (HOSPITAL_BASED_OUTPATIENT_CLINIC_OR_DEPARTMENT_OTHER): Payer: BC Managed Care – PPO | Admitting: Hematology

## 2019-08-18 ENCOUNTER — Other Ambulatory Visit: Payer: Self-pay

## 2019-08-18 VITALS — BP 127/71 | HR 77 | Temp 98.1°F | Resp 17 | Ht 68.0 in | Wt 182.6 lb

## 2019-08-18 DIAGNOSIS — Z1589 Genetic susceptibility to other disease: Secondary | ICD-10-CM

## 2019-08-18 DIAGNOSIS — C50411 Malignant neoplasm of upper-outer quadrant of right female breast: Secondary | ICD-10-CM

## 2019-08-18 DIAGNOSIS — Z5111 Encounter for antineoplastic chemotherapy: Secondary | ICD-10-CM | POA: Diagnosis not present

## 2019-08-18 DIAGNOSIS — Z170 Estrogen receptor positive status [ER+]: Secondary | ICD-10-CM

## 2019-08-18 DIAGNOSIS — Z1501 Genetic susceptibility to malignant neoplasm of breast: Secondary | ICD-10-CM

## 2019-08-18 DIAGNOSIS — E213 Hyperparathyroidism, unspecified: Secondary | ICD-10-CM | POA: Diagnosis not present

## 2019-08-18 DIAGNOSIS — F17210 Nicotine dependence, cigarettes, uncomplicated: Secondary | ICD-10-CM | POA: Diagnosis not present

## 2019-08-18 DIAGNOSIS — L089 Local infection of the skin and subcutaneous tissue, unspecified: Secondary | ICD-10-CM | POA: Diagnosis not present

## 2019-08-18 DIAGNOSIS — Z5112 Encounter for antineoplastic immunotherapy: Secondary | ICD-10-CM | POA: Diagnosis not present

## 2019-08-18 DIAGNOSIS — K429 Umbilical hernia without obstruction or gangrene: Secondary | ICD-10-CM | POA: Diagnosis not present

## 2019-08-18 DIAGNOSIS — Z95828 Presence of other vascular implants and grafts: Secondary | ICD-10-CM

## 2019-08-18 LAB — CBC WITH DIFFERENTIAL (CANCER CENTER ONLY)
Abs Immature Granulocytes: 0.03 10*3/uL (ref 0.00–0.07)
Basophils Absolute: 0.1 10*3/uL (ref 0.0–0.1)
Basophils Relative: 1 %
Eosinophils Absolute: 0.1 10*3/uL (ref 0.0–0.5)
Eosinophils Relative: 2 %
HCT: 37.4 % (ref 36.0–46.0)
Hemoglobin: 12.9 g/dL (ref 12.0–15.0)
Immature Granulocytes: 1 %
Lymphocytes Relative: 38 %
Lymphs Abs: 2.1 10*3/uL (ref 0.7–4.0)
MCH: 32.4 pg (ref 26.0–34.0)
MCHC: 34.5 g/dL (ref 30.0–36.0)
MCV: 94 fL (ref 80.0–100.0)
Monocytes Absolute: 0.4 10*3/uL (ref 0.1–1.0)
Monocytes Relative: 8 %
Neutro Abs: 2.9 10*3/uL (ref 1.7–7.7)
Neutrophils Relative %: 50 %
Platelet Count: 295 10*3/uL (ref 150–400)
RBC: 3.98 MIL/uL (ref 3.87–5.11)
RDW: 14 % (ref 11.5–15.5)
WBC Count: 5.7 10*3/uL (ref 4.0–10.5)
nRBC: 0 % (ref 0.0–0.2)

## 2019-08-18 LAB — CMP (CANCER CENTER ONLY)
ALT: 21 U/L (ref 0–44)
AST: 18 U/L (ref 15–41)
Albumin: 3.6 g/dL (ref 3.5–5.0)
Alkaline Phosphatase: 79 U/L (ref 38–126)
Anion gap: 7 (ref 5–15)
BUN: 11 mg/dL (ref 6–20)
CO2: 24 mmol/L (ref 22–32)
Calcium: 10.9 mg/dL — ABNORMAL HIGH (ref 8.9–10.3)
Chloride: 110 mmol/L (ref 98–111)
Creatinine: 0.76 mg/dL (ref 0.44–1.00)
GFR, Est AFR Am: >60 mL/min (ref >60)
GFR, Estimated: >60 mL/min (ref >60)
Glucose, Bld: 100 mg/dL — ABNORMAL HIGH (ref 70–99)
Potassium: 4.2 mmol/L (ref 3.5–5.1)
Sodium: 141 mmol/L (ref 135–145)
Total Bilirubin: 0.2 mg/dL — ABNORMAL LOW (ref 0.3–1.2)
Total Protein: 6.7 g/dL (ref 6.5–8.1)

## 2019-08-18 MED ORDER — HEPARIN SOD (PORK) LOCK FLUSH 100 UNIT/ML IV SOLN
500.0000 [IU] | Freq: Once | INTRAVENOUS | Status: AC | PRN
  Administered 2019-08-18: 500 [IU]
  Filled 2019-08-18: qty 5

## 2019-08-18 MED ORDER — LORATADINE 10 MG PO TABS
10.0000 mg | ORAL_TABLET | Freq: Every day | ORAL | Status: DC
  Administered 2019-08-18: 10 mg via ORAL

## 2019-08-18 MED ORDER — SODIUM CHLORIDE 0.9% FLUSH
10.0000 mL | Freq: Once | INTRAVENOUS | Status: AC
  Administered 2019-08-18: 10 mL
  Filled 2019-08-18: qty 10

## 2019-08-18 MED ORDER — LORATADINE 10 MG PO TABS
ORAL_TABLET | ORAL | Status: AC
  Filled 2019-08-18: qty 1

## 2019-08-18 MED ORDER — AMOXICILLIN-POT CLAVULANATE 875-125 MG PO TABS
1.0000 | ORAL_TABLET | Freq: Two times a day (BID) | ORAL | 0 refills | Status: DC
Start: 2019-08-18 — End: 2019-09-01

## 2019-08-18 MED ORDER — TRASTUZUMAB-DKST CHEMO 150 MG IV SOLR
2.0000 mg/kg | Freq: Once | INTRAVENOUS | Status: AC
  Administered 2019-08-18: 168 mg via INTRAVENOUS
  Filled 2019-08-18: qty 8

## 2019-08-18 MED ORDER — SODIUM CHLORIDE 0.9% FLUSH
10.0000 mL | INTRAVENOUS | Status: DC | PRN
  Administered 2019-08-18: 10 mL
  Filled 2019-08-18: qty 10

## 2019-08-18 MED ORDER — SODIUM CHLORIDE 0.9 % IV SOLN
Freq: Once | INTRAVENOUS | Status: AC
  Filled 2019-08-18: qty 250

## 2019-08-18 MED ORDER — ACETAMINOPHEN 325 MG PO TABS
650.0000 mg | ORAL_TABLET | Freq: Once | ORAL | Status: AC
  Administered 2019-08-18: 650 mg via ORAL

## 2019-08-18 MED ORDER — ACETAMINOPHEN 325 MG PO TABS
ORAL_TABLET | ORAL | Status: AC
  Filled 2019-08-18: qty 2

## 2019-08-18 MED ORDER — PACLITAXEL PROTEIN-BOUND CHEMO INJECTION 100 MG
100.0000 mg/m2 | Freq: Once | INTRAVENOUS | Status: AC
  Administered 2019-08-18: 200 mg via INTRAVENOUS
  Filled 2019-08-18: qty 40

## 2019-08-18 MED ORDER — PROCHLORPERAZINE MALEATE 10 MG PO TABS
ORAL_TABLET | ORAL | Status: AC
  Filled 2019-08-18: qty 1

## 2019-08-18 MED ORDER — PROCHLORPERAZINE MALEATE 10 MG PO TABS
10.0000 mg | ORAL_TABLET | Freq: Once | ORAL | Status: AC
  Administered 2019-08-18: 10 mg via ORAL

## 2019-08-18 NOTE — Progress Notes (Signed)
Ok to switch benadryl to claritin per Dr. Burr Medico due to restless leg.  Change made by pharmacy.

## 2019-08-18 NOTE — Patient Instructions (Signed)
[facility] Discharge Instructions for Patients Receiving Chemotherapy  Today you received the following chemotherapy agents Trastuzumab (OGIVRI) & Paclitaxel-protein (ABRAXANE).  To help prevent nausea and vomiting after your treatment, we encourage you to take your nausea medication as prescribed.   If you develop nausea and vomiting that is not controlled by your nausea medication, call the clinic.   BELOW ARE SYMPTOMS THAT SHOULD BE REPORTED IMMEDIATELY:  *FEVER GREATER THAN 100.5 F  *CHILLS WITH OR WITHOUT FEVER  NAUSEA AND VOMITING THAT IS NOT CONTROLLED WITH YOUR NAUSEA MEDICATION  *UNUSUAL SHORTNESS OF BREATH  *UNUSUAL BRUISING OR BLEEDING  TENDERNESS IN MOUTH AND THROAT WITH OR WITHOUT PRESENCE OF ULCERS  *URINARY PROBLEMS  *BOWEL PROBLEMS  UNUSUAL RASH Items with * indicate a potential emergency and should be followed up as soon as possible.  Feel free to call the clinic should you have any questions or concerns. The clinic phone number is [phone].  Please show the [facility] at check-in to the Emergency Department and triage nurse.

## 2019-08-19 ENCOUNTER — Telehealth: Payer: Self-pay | Admitting: Hematology

## 2019-08-19 ENCOUNTER — Ambulatory Visit: Payer: BC Managed Care – PPO | Admitting: Gastroenterology

## 2019-08-19 NOTE — Telephone Encounter (Signed)
No 7/15 los

## 2019-08-22 ENCOUNTER — Encounter: Payer: Self-pay | Admitting: *Deleted

## 2019-08-25 ENCOUNTER — Inpatient Hospital Stay: Payer: BC Managed Care – PPO

## 2019-08-25 ENCOUNTER — Other Ambulatory Visit: Payer: Self-pay | Admitting: Hematology

## 2019-08-25 ENCOUNTER — Other Ambulatory Visit: Payer: Self-pay

## 2019-08-25 VITALS — BP 118/70 | HR 72 | Temp 98.1°F | Resp 18 | Ht 68.0 in | Wt 181.0 lb

## 2019-08-25 DIAGNOSIS — C50411 Malignant neoplasm of upper-outer quadrant of right female breast: Secondary | ICD-10-CM | POA: Diagnosis not present

## 2019-08-25 DIAGNOSIS — Z95828 Presence of other vascular implants and grafts: Secondary | ICD-10-CM

## 2019-08-25 DIAGNOSIS — Z170 Estrogen receptor positive status [ER+]: Secondary | ICD-10-CM | POA: Diagnosis not present

## 2019-08-25 DIAGNOSIS — Z5111 Encounter for antineoplastic chemotherapy: Secondary | ICD-10-CM | POA: Diagnosis not present

## 2019-08-25 DIAGNOSIS — Z5112 Encounter for antineoplastic immunotherapy: Secondary | ICD-10-CM | POA: Diagnosis not present

## 2019-08-25 DIAGNOSIS — K429 Umbilical hernia without obstruction or gangrene: Secondary | ICD-10-CM | POA: Diagnosis not present

## 2019-08-25 DIAGNOSIS — E213 Hyperparathyroidism, unspecified: Secondary | ICD-10-CM | POA: Diagnosis not present

## 2019-08-25 DIAGNOSIS — F17210 Nicotine dependence, cigarettes, uncomplicated: Secondary | ICD-10-CM | POA: Diagnosis not present

## 2019-08-25 DIAGNOSIS — Z1501 Genetic susceptibility to malignant neoplasm of breast: Secondary | ICD-10-CM

## 2019-08-25 DIAGNOSIS — L089 Local infection of the skin and subcutaneous tissue, unspecified: Secondary | ICD-10-CM | POA: Diagnosis not present

## 2019-08-25 DIAGNOSIS — Z1589 Genetic susceptibility to other disease: Secondary | ICD-10-CM

## 2019-08-25 LAB — CMP (CANCER CENTER ONLY)
ALT: 15 U/L (ref 0–44)
AST: 15 U/L (ref 15–41)
Albumin: 3.6 g/dL (ref 3.5–5.0)
Alkaline Phosphatase: 75 U/L (ref 38–126)
Anion gap: 8 (ref 5–15)
BUN: 14 mg/dL (ref 6–20)
CO2: 24 mmol/L (ref 22–32)
Calcium: 11.1 mg/dL — ABNORMAL HIGH (ref 8.9–10.3)
Chloride: 107 mmol/L (ref 98–111)
Creatinine: 0.81 mg/dL (ref 0.44–1.00)
GFR, Est AFR Am: >60 mL/min (ref >60)
GFR, Estimated: >60 mL/min (ref >60)
Glucose, Bld: 117 mg/dL — ABNORMAL HIGH (ref 70–99)
Potassium: 4.1 mmol/L (ref 3.5–5.1)
Sodium: 139 mmol/L (ref 135–145)
Total Bilirubin: 0.3 mg/dL (ref 0.3–1.2)
Total Protein: 6.7 g/dL (ref 6.5–8.1)

## 2019-08-25 LAB — CBC WITH DIFFERENTIAL (CANCER CENTER ONLY)
Abs Immature Granulocytes: 0.04 10*3/uL (ref 0.00–0.07)
Basophils Absolute: 0 10*3/uL (ref 0.0–0.1)
Basophils Relative: 1 %
Eosinophils Absolute: 0.1 10*3/uL (ref 0.0–0.5)
Eosinophils Relative: 1 %
HCT: 37.8 % (ref 36.0–46.0)
Hemoglobin: 13.1 g/dL (ref 12.0–15.0)
Immature Granulocytes: 1 %
Lymphocytes Relative: 49 %
Lymphs Abs: 2.4 10*3/uL (ref 0.7–4.0)
MCH: 32.3 pg (ref 26.0–34.0)
MCHC: 34.7 g/dL (ref 30.0–36.0)
MCV: 93.3 fL (ref 80.0–100.0)
Monocytes Absolute: 0.3 10*3/uL (ref 0.1–1.0)
Monocytes Relative: 6 %
Neutro Abs: 2.1 10*3/uL (ref 1.7–7.7)
Neutrophils Relative %: 42 %
Platelet Count: 297 10*3/uL (ref 150–400)
RBC: 4.05 MIL/uL (ref 3.87–5.11)
RDW: 13.9 % (ref 11.5–15.5)
WBC Count: 5 10*3/uL (ref 4.0–10.5)
nRBC: 0 % (ref 0.0–0.2)

## 2019-08-25 MED ORDER — HEPARIN SOD (PORK) LOCK FLUSH 100 UNIT/ML IV SOLN
500.0000 [IU] | Freq: Once | INTRAVENOUS | Status: AC | PRN
  Administered 2019-08-25: 500 [IU]
  Filled 2019-08-25: qty 5

## 2019-08-25 MED ORDER — SODIUM CHLORIDE 0.9% FLUSH
10.0000 mL | INTRAVENOUS | Status: DC | PRN
  Administered 2019-08-25: 10 mL
  Filled 2019-08-25: qty 10

## 2019-08-25 MED ORDER — ACETAMINOPHEN 325 MG PO TABS
ORAL_TABLET | ORAL | Status: AC
  Filled 2019-08-25: qty 2

## 2019-08-25 MED ORDER — PROCHLORPERAZINE MALEATE 10 MG PO TABS
10.0000 mg | ORAL_TABLET | Freq: Once | ORAL | Status: AC
  Administered 2019-08-25: 10 mg via ORAL

## 2019-08-25 MED ORDER — ACETAMINOPHEN 325 MG PO TABS
650.0000 mg | ORAL_TABLET | Freq: Once | ORAL | Status: AC
  Administered 2019-08-25: 650 mg via ORAL

## 2019-08-25 MED ORDER — LORATADINE 10 MG PO TABS
ORAL_TABLET | ORAL | Status: AC
  Filled 2019-08-25: qty 1

## 2019-08-25 MED ORDER — SODIUM CHLORIDE 0.9% FLUSH
10.0000 mL | Freq: Once | INTRAVENOUS | Status: AC
  Administered 2019-08-25: 10 mL
  Filled 2019-08-25: qty 10

## 2019-08-25 MED ORDER — PACLITAXEL PROTEIN-BOUND CHEMO INJECTION 100 MG
100.0000 mg/m2 | Freq: Once | INTRAVENOUS | Status: AC
  Administered 2019-08-25: 200 mg via INTRAVENOUS
  Filled 2019-08-25: qty 40

## 2019-08-25 MED ORDER — LORATADINE 10 MG PO TABS
10.0000 mg | ORAL_TABLET | Freq: Every day | ORAL | Status: DC
  Administered 2019-08-25: 10 mg via ORAL

## 2019-08-25 MED ORDER — PROCHLORPERAZINE MALEATE 10 MG PO TABS
ORAL_TABLET | ORAL | Status: AC
  Filled 2019-08-25: qty 1

## 2019-08-25 MED ORDER — TRASTUZUMAB-DKST CHEMO 150 MG IV SOLR
2.0000 mg/kg | Freq: Once | INTRAVENOUS | Status: AC
  Administered 2019-08-25: 168 mg via INTRAVENOUS
  Filled 2019-08-25: qty 8

## 2019-08-25 MED ORDER — SODIUM CHLORIDE 0.9 % IV SOLN
Freq: Once | INTRAVENOUS | Status: AC
  Filled 2019-08-25: qty 250

## 2019-08-25 NOTE — Patient Instructions (Signed)
[facility] Discharge Instructions for Patients Receiving Chemotherapy  Today you received the following chemotherapy agents: Abraxane, Trastuzumab  To help prevent nausea and vomiting after your treatment, we encourage you to take your nausea medication as directed.    If you develop nausea and vomiting that is not controlled by your nausea medication, call the clinic.   BELOW ARE SYMPTOMS THAT SHOULD BE REPORTED IMMEDIATELY:  *FEVER GREATER THAN 100.5 F  *CHILLS WITH OR WITHOUT FEVER  NAUSEA AND VOMITING THAT IS NOT CONTROLLED WITH YOUR NAUSEA MEDICATION  *UNUSUAL SHORTNESS OF BREATH  *UNUSUAL BRUISING OR BLEEDING  TENDERNESS IN MOUTH AND THROAT WITH OR WITHOUT PRESENCE OF ULCERS  *URINARY PROBLEMS  *BOWEL PROBLEMS  UNUSUAL RASH Items with * indicate a potential emergency and should be followed up as soon as possible.  Feel free to call the clinic should you have any questions or concerns. The clinic phone number is [phone].  Please show the [facility] at check-in to the Emergency Department and triage nurse.

## 2019-08-26 NOTE — Progress Notes (Signed)
[facility]   [phone] Fax:([phone]) [phone]   Clinic Follow up Note   Patient Care Team: Patient, [name] as PCP - General ([place]) Minus Breeding, MD as PCP - Cardiology (Cardiology) Armbruster, Carlota Raspberry, MD as Consulting Physician (Gastroenterology) Eyvonne Mechanic as Counselor (Licensed Clinical Social Worker) Truitt Merle, MD as Consulting Physician (Hematology) Alla Feeling, NP as Nurse Practitioner (Nurse Practitioner) Mauro Kaufmann, RN as Oncology Nurse Navigator Rockwell Germany, RN as Oncology Nurse Navigator Rolm Bookbinder, MD as Consulting Physician (General Surgery)  Date of Service:  09/01/2019  CHIEF COMPLAINT: F/u ofRight breast cancer, CHEK2 mutation (+)  SUMMARY OF ONCOLOGIC HISTORY: Oncology History Overview Note  Cancer Staging Malignant neoplasm of upper-outer quadrant of right breast in female, estrogen receptor positive ([place]) Staging form: Breast, AJCC 8th Edition - Clinical stage from 06/01/2019: Stage IA (cT1c, cN0, cM0, G3, ER+, PR-, HER2+) - Signed by Truitt Merle, MD on 06/17/2019 - Pathologic stage from 06/30/2019: Stage IA (pT1c, pN0, cM0, G3, ER+, PR-, HER2+) - Signed by Truitt Merle, MD on 07/15/2019    Malignant neoplasm of upper-outer quadrant of right breast in female, estrogen receptor positive ([place])  10/2018 -  Anti-estrogen oral therapy   She was preventively started on Tamoxifen since 10/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 Held after 06/17/19 to proceed with her breast cancer surgery and chemo.    05/06/2019 Breast MRI   IMPRESSION Enhancing right breast mass suspicious for malignancy. Second look [place] if recommended.  No Suspicious enhancing left breast masses.    05/24/2019 Breast US   IMPRESSION 1.8cm irregular right breast mass within the 9:30 position 3.5cm from the nipple is highly suggestive of malignancy. An Ultrasound guided breast biopsy is recommended. The findings of the study and recommendation for  biopsy were discussed with the patient directly.    06/01/2019 Cancer Staging   Staging form: Breast, AJCC 8th Edition - Clinical stage from 06/01/2019: Stage IA (cT1c, cN0, cM0, G3, ER+, PR-, HER2+) - Signed by Truitt Merle, MD on 06/17/2019   06/01/2019 Initial Biopsy   Breast Biopsy - Right Breast Core Bx DIAGNOSIS INFILTRATING DUCT CARCINOMA WITH MINOR DUCTAL CARCINOMA IN SITU COMPNENT OF RIGHT BREAST, CORE NEEDLE BIOPSY    06/01/2019 Receptors her2   ER: greater than 75% of tumor cells show moderate staining  PR: Negative  HER2: Positive 3+ Ki67: 40%   06/17/2019 Initial Diagnosis   Malignant neoplasm of upper-outer quadrant of right breast in female, estrogen receptor positive ([place])   06/30/2019 Surgery   RIGHT BREAST LUMPECTOMY WITH RADIOACTIVE SEED AND RIGHT AXILLARY SENTINEL NODE BIOPSY and PAC placement  by Dr Donne Hazel     06/30/2019 Pathology Results   FINAL MICROSCOPIC DIAGNOSIS:   A. BREAST, RIGHT, LUMPECTOMY:  - Invasive ductal carcinoma, grade 3, spanning 1.5 cm.  - Intermediate grade ductal carcinoma in situ.  - Invasive carcinoma is 0.1-0.2 from the final lateral margin (part E).  - Margins are negative for in situ carcinoma.  - Biopsy site.  - See oncology table.   B. LYMPH NODE, RIGHT AXILLARY, SENTINEL, BIOPSY:  - One of one lymph nodes negative for carcinoma (0/1).   C. LYMPH NODE, RIGHT AXILLARY, SENTINEL, BIOPSY:  - One of one lymph nodes negative for carcinoma (0/1).   D. LYMPH NODE, RIGHT AXILLARY, SENTINEL, BIOPSY:  - One of one lymph nodes negative for carcinoma (0/1).   E. BREAST, RIGHT ADDITIONAL LATERAL MARGIN, EXCISION:  - Invasive ductal carcinoma.  -  Invasive carcinoma is 0.1-0.2 cm from the new lateral margin.   F. BREAST, RIGHT ADDITIONAL POSTERIOR MARGIN, EXCISION:  - Fibrocystic change and usual ductal hyperplasia.  - No malignancy identified.   G. BREAST, RIGHT ADDITIONAL SUPERIOR MARGIN, EXCISION:  - Fibrocystic change and usual ductal  hyperplasia.  - Incidental radial scar.  - No malignancy identified.     PROGNOSTIC INDICATOR RESULTS:  The tumor cells are POSITIVE for Her2 (3+).  Estrogen Receptor:       POSITIVE, 85%, MODERATE STAINING  Progesterone Receptor:   NEGATIVE  Proliferation Marker Ki-67:   25%    06/30/2019 Cancer Staging   Staging form: Breast, AJCC 8th Edition - Pathologic stage from 06/30/2019: Stage IA (pT1c, pN0, cM0, G3, ER+, PR-, HER2+) - Signed by [name], MD on 07/15/2019   07/29/2019 -  Chemotherapy   Adjuvant Weekly Abraxane and Herceptin for 12 weeks starting 07/29/19, followed by maintenance Herceptin q3weeks to complete 1 year of treatment.       CURRENT THERAPY:  Adjuvant Weekly Abraxane and Herceptin for 12 weeks starting 07/29/19, followed by maintenance Herceptin q3weeks to complete 1 year of treatment.   INTERVAL HISTORY:  Abigail Berg is here for a follow up and treatment. She presents to the clinic with sister. She notes she is doing well. She notes total hair loss. She notes this week having significant headache that lasted 8 hours. She had vision change with light sensitivity during this time then resolved. She notes she took Tylenol for this. This has not recurred since. She notes she has HA 2-3 days after chemo which has progressed into a migraine.     REVIEW OF SYSTEMS:   Constitutional: Denies fevers, chills or abnormal weight loss  Eyes: Denies blurriness of vision Ears, nose, mouth, throat, and face: Denies mucositis or sore throat Respiratory: Denies cough, dyspnea or wheezes Cardiovascular: Denies palpitation, chest discomfort or lower extremity swelling Gastrointestinal:  Denies nausea, heartburn or change in bowel habits Skin: Denies abnormal skin rashes Lymphatics: Denies new lymphadenopathy or easy bruising Neurological:Denies numbness, tingling or new weaknesses (+) Migraine  Behavioral/Psych: Mood is stable, no new changes  All other systems were reviewed with  the patient and are negative.  MEDICAL HISTORY:  Past Medical History:  Diagnosis Date  . Depression   . Family history of colon cancer   . Monoallelic mutation of CHEK2 gene in female patient   . Personal history of colonic polyps   . S/P partial colectomy     SURGICAL HISTORY: Past Surgical History:  Procedure Laterality Date  . BREAST LUMPECTOMY WITH RADIOACTIVE SEED AND SENTINEL LYMPH NODE BIOPSY Right 06/30/2019   Procedure: RIGHT BREAST LUMPECTOMY WITH RADIOACTIVE SEED AND RIGHT AXILLARY SENTINEL NODE BIOPSY;  Surgeon: Emelia Loron, MD;  Location: [facility];  Service: General;  Laterality: Right;  PEC BLOCK  . BREAST SURGERY    . COLON SURGERY     Partial colectomy  . COLONOSCOPY    . POLYPECTOMY    . PORTACATH PLACEMENT Left 06/30/2019   Procedure: INSERTION PORT-A-CATH WITH ULTRASOUND GUIDANCE;  Surgeon: Emelia Loron, MD;  Location: [facility];  Service: General;  Laterality: Left;    I have reviewed the social history and family history with the patient and they are unchanged from previous note.  ALLERGIES:  has No Known Allergies.  MEDICATIONS:  Current Outpatient Medications  Medication Sig Dispense Refill  . acetaminophen (TYLENOL) 325 MG tablet Take 650 mg by mouth every 6 (six)  hours as needed for headache.    . escitalopram (LEXAPRO) 20 MG tablet Take 20 mg by mouth daily.     Marland Kitchen ibuprofen (ADVIL) 200 MG tablet Take 200 mg by mouth every 6 (six) hours as needed for headache or mild pain.    Marland Kitchen lidocaine-prilocaine (EMLA) cream Apply to affected area once 30 g 3  . LORazepam (ATIVAN) 0.5 MG tablet Take 1 tablet (0.5 mg total) by mouth every 8 (eight) hours. 30 tablet 0  . Multiple Vitamins-Minerals (CENTRUM WOMEN PO) Take 1 tablet by mouth daily.    . ondansetron (ZOFRAN) 8 MG tablet Take 1 tablet (8 mg total) by mouth 2 (two) times daily as needed (Nausea or vomiting). 30 tablet 1  . prochlorperazine (COMPAZINE) 10 MG  tablet Take 1 tablet (10 mg total) by mouth every 6 (six) hours as needed (Nausea or vomiting). 30 tablet 1   No current facility-administered medications for this visit.    PHYSICAL EXAMINATION: ECOG PERFORMANCE STATUS: 1 - Symptomatic but completely ambulatory  Vitals:   09/01/19 1115  BP: (!) 120/64  Pulse: 71  Resp: 18  Temp: 97.9 F (36.6 C)  SpO2: 100%   Filed Weights   09/01/19 1115  Weight: 183 lb 11.2 oz (83.3 kg)    GENERAL:alert, no distress and comfortable SKIN: skin color, texture, turgor are normal, no rashes or significant lesions EYES: normal, Conjunctiva are pink and non-injected, sclera clear NECK: supple, thyroid normal size, non-tender, without nodularity LYMPH:  no palpable lymphadenopathy in the cervical, axillary  LUNGS: clear to auscultation and percussion with normal breathing effort HEART: regular rate & rhythm and no murmurs and no lower extremity edema ABDOMEN:abdomen soft, non-tender and normal bowel sounds Musculoskeletal:no cyanosis of digits and no clubbing  NEURO: alert & oriented x 3 with fluent speech, no focal motor/sensory deficits BREAST: (+) Right lumpectomy: Surgical incision healed well No palpable mass, nodules or adenopathy bilaterally. Breast exam benign.  LABORATORY DATA:  I have reviewed the data as listed CBC Latest Ref Rng & Units 09/01/2019 08/25/2019 08/18/2019  WBC 4.0 - 10.5 K/uL 5.1 5.0 5.7  Hemoglobin 12.0 - 15.0 g/dL 13.1 13.1 12.9  Hematocrit 36 - 46 % 37.9 37.8 37.4  Platelets 150 - 400 K/uL 286 297 295     CMP Latest Ref Rng & Units 09/01/2019 08/25/2019 08/18/2019  Glucose 70 - 99 mg/dL 117(H) 117(H) 100(H)  BUN 6 - 20 mg/dL '11 14 11  '$ Creatinine 0.44 - 1.00 mg/dL 0.78 0.81 0.76  Sodium 135 - 145 mmol/L 140 139 141  Potassium 3.5 - 5.1 mmol/L 4.2 4.1 4.2  Chloride 98 - 111 mmol/L 109 107 110  CO2 22 - 32 mmol/L '24 24 24  '$ Calcium 8.9 - 10.3 mg/dL 11.4(H) 11.1(H) 10.9(H)  Total Protein 6.5 - 8.1 g/dL 6.8 6.7 6.7    Total Bilirubin 0.3 - 1.2 mg/dL 0.3 0.3 0.2(L)  Alkaline Phos 38 - 126 U/L 82 75 79  AST 15 - 41 U/L 14(L) 15 18  ALT 0 - 44 U/L '14 15 21      '$ RADIOGRAPHIC STUDIES: I have personally reviewed the radiological images as listed and agreed with the findings in the report. No results found.   ASSESSMENT & PLAN:  Jasdeep Dejarnett is a 54 y.o. female with    1.Malignant neoplasm of upper-outer quadrant of right breast,Stage1A,p(T1cN0M0),stage IA,ER+/PR-/HER2+, Grade3. -She was diagnosed in 05/2019. She underwent right breast lumpectomy with SLNB by Dr Donne Hazel on 06/30/19. -I previously discussed her HER-2  positive breast cancer is more aggressive, and predicts high risk of recurrence.  -To reduce her high risk of recurrence, I started her on chemotherapy with weekly Abraxane and Anti-HER2 Herceptin for 12 weeks beginning 07/29/19. Afterward she will proceed with maintenance Herceptin q3weeks to complete 1 year of treatment. Goal of therapy is curative.  -S/p week 5 she is tolerated treatment mostly well. She has had total hair loss and Headaches that worsen with each treatment. I reviewed management with her. I also reviewed with pharmacy, we think her headache is probably related chemo premeds, but possible with Abraxane or trastuzumab. So I will hold Ogivri today  -Labs reviewed and adequate to proceed with C6 Taxol and Herceptin today. Continue weekly.  -f/u in 2 weeks    2. 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 -Wepreviouslydiscussed that CHEK2 1100delC mutation is associated with 2-4 times high risk of breast cancer than general population and her moderate increased risk of colon cancer. Her estimated lifetime risk of breast cancer is probably around 25-35% -She will continueannual screening breast MRI in addition to mammogram,self breast exam, and physician breast exam twice a year.Her GYN will order for  her to be done locally. -She will f/u with Dr. Adela Lank for screening colonoscopies, last done 07/2018 with12polyps. I recommend to repeat in next 2 years.  -She started chemoprevention withTamoxifen once daily in 10/2018, but stopped on 06/17/19 to proceed with start of breast cancer treatment. -She notes a small brown skin spot on her lateral right upper arm, which appears more like a benign mole on exam (09/01/19). She can monitor this for any change, but I have very low suspicion for this.    3. Health Maintenance, Smoking Cessation  -She willcontinue f/u with PCP, OB/Gyn for routine health maintenance and other age appropriate cancer screenings -Wepreviouslydiscussed maintaining a healthy lifestyle. -She has been smoking for 25 years. She is currently smoking 1/2ppd. She has mild elevated Hg and can negatively effect her overall health. I have reviewedsmoking cessation with her.   4. S/p ileocecectomy on 08/14/15 due to benign colon polyp invading appendix, history of recurrent colon polyps  5.H/o right breast fibroadenoma,S/p rightsurgical resection on 11/22/15  6. Hypercalcemia, likelyprimary hyperparathyroidism -She is not on Calcium or Vit D, but takes MVI  -Her PTH was within normal limits, but on the high end. PTHrp was negative. This is likelyprimary hyperparathyroidism -Will refer her to endocrinology after she completes adjuvant chemotherapy -I recommend her to take a multivitamin which contains low-dose calcium  7. Skin infection  -She developed a skin infection of her face, mainly at right nasolabial fold, S/p C3 chemo -I encouraged her to keep skin clean and I started her on Augmentin on 08/18/19.   8. Umbilical Hernia  -She has had a hernia for a few months now. Her recent CT AP showed incidental findings of Hernia above umbilical that contains fat and small b/l kidney stones along with ovarian cyst.  -She has seen Dr Dwain Sarna who will do hernia  repair before or after Radiation.   9. Headaches, Migraines  -She notes since starting chemo she has had headaches 2-3 days after infusion. These worsen with each treatment and this week has had a migraine that effected her vision with light sensitivity and lasted 8 hours before resolving.  -I discussed this can be related to Taxol's side effects of muscle aches. Will monitor and if worsens or persists I will refer her  to neurologist Dr Mickeal Skinner.  -She can continue Tylenol and OTC migraine medications.    PLAN: -Labs reviewed and adequate to proceed with week 6 Abraxane, but will hold Ogivri today due to her recent headache   -Lab, flush and Abraxane/Herceptin weekly -f/u in 2 weeks -will call her on Monday, if no headaches after this cycle, will change Ogivri to Herceptin if her insurance approves it    No problem-specific Assessment & Plan notes found for this encounter.   No orders of the defined types were placed in this encounter.  All questions were answered. The patient knows to call the clinic with any problems, questions or concerns. No barriers to learning was detected. The total time spent in the appointment was 30 minutes.     Truitt Merle, MD 09/01/2019   I, Joslyn Devon, am acting as scribe for Truitt Merle, MD.   I have reviewed the above documentation for accuracy and completeness, and I agree with the above.

## 2019-09-01 ENCOUNTER — Inpatient Hospital Stay (HOSPITAL_BASED_OUTPATIENT_CLINIC_OR_DEPARTMENT_OTHER): Payer: BC Managed Care – PPO | Admitting: Hematology

## 2019-09-01 ENCOUNTER — Inpatient Hospital Stay: Payer: BC Managed Care – PPO

## 2019-09-01 ENCOUNTER — Encounter: Payer: Self-pay | Admitting: Hematology

## 2019-09-01 ENCOUNTER — Other Ambulatory Visit: Payer: Self-pay

## 2019-09-01 VITALS — BP 120/64 | HR 71 | Temp 97.9°F | Resp 18 | Ht 68.0 in | Wt 183.7 lb

## 2019-09-01 DIAGNOSIS — F17210 Nicotine dependence, cigarettes, uncomplicated: Secondary | ICD-10-CM | POA: Diagnosis not present

## 2019-09-01 DIAGNOSIS — Z5112 Encounter for antineoplastic immunotherapy: Secondary | ICD-10-CM | POA: Diagnosis not present

## 2019-09-01 DIAGNOSIS — C50411 Malignant neoplasm of upper-outer quadrant of right female breast: Secondary | ICD-10-CM

## 2019-09-01 DIAGNOSIS — L089 Local infection of the skin and subcutaneous tissue, unspecified: Secondary | ICD-10-CM | POA: Diagnosis not present

## 2019-09-01 DIAGNOSIS — Z1589 Genetic susceptibility to other disease: Secondary | ICD-10-CM

## 2019-09-01 DIAGNOSIS — Z170 Estrogen receptor positive status [ER+]: Secondary | ICD-10-CM | POA: Diagnosis not present

## 2019-09-01 DIAGNOSIS — Z1501 Genetic susceptibility to malignant neoplasm of breast: Secondary | ICD-10-CM

## 2019-09-01 DIAGNOSIS — Z5111 Encounter for antineoplastic chemotherapy: Secondary | ICD-10-CM | POA: Diagnosis not present

## 2019-09-01 DIAGNOSIS — K429 Umbilical hernia without obstruction or gangrene: Secondary | ICD-10-CM | POA: Diagnosis not present

## 2019-09-01 DIAGNOSIS — Z95828 Presence of other vascular implants and grafts: Secondary | ICD-10-CM

## 2019-09-01 DIAGNOSIS — E213 Hyperparathyroidism, unspecified: Secondary | ICD-10-CM | POA: Diagnosis not present

## 2019-09-01 LAB — CMP (CANCER CENTER ONLY)
ALT: 14 U/L (ref 0–44)
AST: 14 U/L — ABNORMAL LOW (ref 15–41)
Albumin: 3.8 g/dL (ref 3.5–5.0)
Alkaline Phosphatase: 82 U/L (ref 38–126)
Anion gap: 7 (ref 5–15)
BUN: 11 mg/dL (ref 6–20)
CO2: 24 mmol/L (ref 22–32)
Calcium: 11.4 mg/dL — ABNORMAL HIGH (ref 8.9–10.3)
Chloride: 109 mmol/L (ref 98–111)
Creatinine: 0.78 mg/dL (ref 0.44–1.00)
GFR, Est AFR Am: >60 mL/min (ref >60)
GFR, Estimated: >60 mL/min (ref >60)
Glucose, Bld: 117 mg/dL — ABNORMAL HIGH (ref 70–99)
Potassium: 4.2 mmol/L (ref 3.5–5.1)
Sodium: 140 mmol/L (ref 135–145)
Total Bilirubin: 0.3 mg/dL (ref 0.3–1.2)
Total Protein: 6.8 g/dL (ref 6.5–8.1)

## 2019-09-01 LAB — CBC WITH DIFFERENTIAL (CANCER CENTER ONLY)
Abs Immature Granulocytes: 0.03 10*3/uL (ref 0.00–0.07)
Basophils Absolute: 0.1 10*3/uL (ref 0.0–0.1)
Basophils Relative: 1 %
Eosinophils Absolute: 0.1 10*3/uL (ref 0.0–0.5)
Eosinophils Relative: 2 %
HCT: 37.9 % (ref 36.0–46.0)
Hemoglobin: 13.1 g/dL (ref 12.0–15.0)
Immature Granulocytes: 1 %
Lymphocytes Relative: 40 %
Lymphs Abs: 2.1 10*3/uL (ref 0.7–4.0)
MCH: 32.4 pg (ref 26.0–34.0)
MCHC: 34.6 g/dL (ref 30.0–36.0)
MCV: 93.8 fL (ref 80.0–100.0)
Monocytes Absolute: 0.2 10*3/uL (ref 0.1–1.0)
Monocytes Relative: 5 %
Neutro Abs: 2.6 10*3/uL (ref 1.7–7.7)
Neutrophils Relative %: 51 %
Platelet Count: 286 10*3/uL (ref 150–400)
RBC: 4.04 MIL/uL (ref 3.87–5.11)
RDW: 14.5 % (ref 11.5–15.5)
WBC Count: 5.1 10*3/uL (ref 4.0–10.5)
nRBC: 0 % (ref 0.0–0.2)

## 2019-09-01 MED ORDER — LORATADINE 10 MG PO TABS: 10.0000 mg | ORAL_TABLET | Freq: Every day | ORAL | Status: DC

## 2019-09-01 MED ORDER — PROCHLORPERAZINE MALEATE 10 MG PO TABS
10.0000 mg | ORAL_TABLET | Freq: Once | ORAL | Status: AC
  Administered 2019-09-01: 10 mg via ORAL

## 2019-09-01 MED ORDER — LORATADINE 10 MG PO TABS
ORAL_TABLET | ORAL | Status: AC
  Filled 2019-09-01: qty 1

## 2019-09-01 MED ORDER — HEPARIN SOD (PORK) LOCK FLUSH 100 UNIT/ML IV SOLN
500.0000 [IU] | Freq: Once | INTRAVENOUS | Status: AC | PRN
  Administered 2019-09-01: 500 [IU]
  Filled 2019-09-01: qty 5

## 2019-09-01 MED ORDER — ACETAMINOPHEN 325 MG PO TABS
ORAL_TABLET | ORAL | Status: AC
  Filled 2019-09-01: qty 2

## 2019-09-01 MED ORDER — PROCHLORPERAZINE MALEATE 10 MG PO TABS
ORAL_TABLET | ORAL | Status: AC
  Filled 2019-09-01: qty 1

## 2019-09-01 MED ORDER — SODIUM CHLORIDE 0.9% FLUSH
10.0000 mL | INTRAVENOUS | Status: DC | PRN
  Administered 2019-09-01: 10 mL
  Filled 2019-09-01: qty 10

## 2019-09-01 MED ORDER — PACLITAXEL PROTEIN-BOUND CHEMO INJECTION 100 MG
100.0000 mg/m2 | Freq: Once | INTRAVENOUS | Status: AC
  Administered 2019-09-01: 200 mg via INTRAVENOUS
  Filled 2019-09-01: qty 40

## 2019-09-01 MED ORDER — SODIUM CHLORIDE 0.9% FLUSH
10.0000 mL | Freq: Once | INTRAVENOUS | Status: AC
  Administered 2019-09-01: 10 mL
  Filled 2019-09-01: qty 10

## 2019-09-01 MED ORDER — TRASTUZUMAB-DKST CHEMO 150 MG IV SOLR: 2.0000 mg/kg | Freq: Once | INTRAVENOUS | Status: DC

## 2019-09-01 MED ORDER — ACETAMINOPHEN 325 MG PO TABS
650.0000 mg | ORAL_TABLET | Freq: Once | ORAL | Status: AC
  Administered 2019-09-01: 650 mg via ORAL

## 2019-09-01 MED ORDER — SODIUM CHLORIDE 0.9 % IV SOLN
Freq: Once | INTRAVENOUS | Status: AC
  Filled 2019-09-01: qty 250

## 2019-09-01 NOTE — Patient Instructions (Addendum)
[facility] Discharge Instructions for Patients Receiving Chemotherapy  Today you received the following chemotherapy agents:  Abraxane.  To help prevent nausea and vomiting after your treatment, we encourage you to take your nausea medication as directed.   If you develop nausea and vomiting that is not controlled by your nausea medication, call the clinic.   BELOW ARE SYMPTOMS THAT SHOULD BE REPORTED IMMEDIATELY:  *FEVER GREATER THAN 100.5 F  *CHILLS WITH OR WITHOUT FEVER  NAUSEA AND VOMITING THAT IS NOT CONTROLLED WITH YOUR NAUSEA MEDICATION  *UNUSUAL SHORTNESS OF BREATH  *UNUSUAL BRUISING OR BLEEDING  TENDERNESS IN MOUTH AND THROAT WITH OR WITHOUT PRESENCE OF ULCERS  *URINARY PROBLEMS  *BOWEL PROBLEMS  UNUSUAL RASH Items with * indicate a potential emergency and should be followed up as soon as possible.  Feel free to call the clinic should you have any questions or concerns. The clinic phone number is [phone].  Please show the CHEMO ALERT CARD at check-in to the Emergency Department and triage nurse.   

## 2019-09-01 NOTE — Patient Instructions (Signed)

## 2019-09-01 NOTE — Progress Notes (Signed)
Per Dr. Burr Medico, no Trastuzumab (or Claritin) today d/t migraine HA. MD will f/u w/ pt on 8/2 to see how she tolerated. If no HA, Dr. Burr Medico may switch pt to parent drug, Herceptin.  Auth will be req'd.  Kennith Center, Pharm.D., CPP 09/01/2019@12 :35 PM

## 2019-09-03 ENCOUNTER — Inpatient Hospital Stay: Payer: BC Managed Care – PPO

## 2019-09-06 ENCOUNTER — Telehealth: Payer: Self-pay

## 2019-09-06 NOTE — Telephone Encounter (Signed)
Abigail Berg called to let us know she did not have any headaches after chemotherapy this cycle.

## 2019-09-07 NOTE — Addendum Note (Signed)
Addended by: Truitt Merle on: 09/07/2019 11:44 AM   Modules accepted: Orders

## 2019-09-08 ENCOUNTER — Other Ambulatory Visit: Payer: Self-pay

## 2019-09-08 ENCOUNTER — Inpatient Hospital Stay: Payer: BC Managed Care – PPO

## 2019-09-08 ENCOUNTER — Inpatient Hospital Stay: Payer: BC Managed Care – PPO | Attending: Hematology

## 2019-09-08 ENCOUNTER — Other Ambulatory Visit: Payer: Self-pay | Admitting: Hematology

## 2019-09-08 ENCOUNTER — Encounter: Payer: Self-pay | Admitting: *Deleted

## 2019-09-08 VITALS — BP 124/81 | HR 75 | Temp 98.4°F | Resp 18 | Ht 68.0 in | Wt 182.0 lb

## 2019-09-08 DIAGNOSIS — Z5111 Encounter for antineoplastic chemotherapy: Secondary | ICD-10-CM | POA: Insufficient documentation

## 2019-09-08 DIAGNOSIS — Z5112 Encounter for antineoplastic immunotherapy: Secondary | ICD-10-CM | POA: Diagnosis not present

## 2019-09-08 DIAGNOSIS — Z1501 Genetic susceptibility to malignant neoplasm of breast: Secondary | ICD-10-CM

## 2019-09-08 DIAGNOSIS — Z1509 Genetic susceptibility to other malignant neoplasm: Secondary | ICD-10-CM

## 2019-09-08 DIAGNOSIS — Z170 Estrogen receptor positive status [ER+]: Secondary | ICD-10-CM

## 2019-09-08 DIAGNOSIS — C50411 Malignant neoplasm of upper-outer quadrant of right female breast: Secondary | ICD-10-CM | POA: Diagnosis not present

## 2019-09-08 DIAGNOSIS — Z95828 Presence of other vascular implants and grafts: Secondary | ICD-10-CM

## 2019-09-08 DIAGNOSIS — M79604 Pain in right leg: Secondary | ICD-10-CM | POA: Insufficient documentation

## 2019-09-08 LAB — CBC WITH DIFFERENTIAL (CANCER CENTER ONLY)
Abs Immature Granulocytes: 0.06 10*3/uL (ref 0.00–0.07)
Basophils Absolute: 0.1 10*3/uL (ref 0.0–0.1)
Basophils Relative: 1 %
Eosinophils Absolute: 0.1 10*3/uL (ref 0.0–0.5)
Eosinophils Relative: 2 %
HCT: 38.2 % (ref 36.0–46.0)
Hemoglobin: 13.2 g/dL (ref 12.0–15.0)
Immature Granulocytes: 1 %
Lymphocytes Relative: 43 %
Lymphs Abs: 2.6 10*3/uL (ref 0.7–4.0)
MCH: 32 pg (ref 26.0–34.0)
MCHC: 34.6 g/dL (ref 30.0–36.0)
MCV: 92.7 fL (ref 80.0–100.0)
Monocytes Absolute: 0.4 10*3/uL (ref 0.1–1.0)
Monocytes Relative: 6 %
Neutro Abs: 2.8 10*3/uL (ref 1.7–7.7)
Neutrophils Relative %: 47 %
Platelet Count: 279 10*3/uL (ref 150–400)
RBC: 4.12 MIL/uL (ref 3.87–5.11)
RDW: 14.5 % (ref 11.5–15.5)
WBC Count: 5.9 10*3/uL (ref 4.0–10.5)
nRBC: 0 % (ref 0.0–0.2)

## 2019-09-08 LAB — CMP (CANCER CENTER ONLY)
ALT: 23 U/L (ref 0–44)
AST: 22 U/L (ref 15–41)
Albumin: 3.8 g/dL (ref 3.5–5.0)
Alkaline Phosphatase: 79 U/L (ref 38–126)
Anion gap: 7 (ref 5–15)
BUN: 11 mg/dL (ref 6–20)
CO2: 23 mmol/L (ref 22–32)
Calcium: 11.4 mg/dL — ABNORMAL HIGH (ref 8.9–10.3)
Chloride: 109 mmol/L (ref 98–111)
Creatinine: 0.73 mg/dL (ref 0.44–1.00)
GFR, Est AFR Am: >60 mL/min (ref >60)
GFR, Estimated: >60 mL/min (ref >60)
Glucose, Bld: 101 mg/dL — ABNORMAL HIGH (ref 70–99)
Potassium: 4.2 mmol/L (ref 3.5–5.1)
Sodium: 139 mmol/L (ref 135–145)
Total Bilirubin: 0.3 mg/dL (ref 0.3–1.2)
Total Protein: 7 g/dL (ref 6.5–8.1)

## 2019-09-08 MED ORDER — PROCHLORPERAZINE MALEATE 10 MG PO TABS
10.0000 mg | ORAL_TABLET | Freq: Once | ORAL | Status: AC
  Administered 2019-09-08: 10 mg via ORAL

## 2019-09-08 MED ORDER — TRASTUZUMAB-ANNS CHEMO 150 MG IV SOLR
2.0000 mg/kg | Freq: Once | INTRAVENOUS | Status: AC
  Administered 2019-09-08: 168 mg via INTRAVENOUS
  Filled 2019-09-08: qty 8

## 2019-09-08 MED ORDER — LORATADINE 10 MG PO TABS
ORAL_TABLET | ORAL | Status: AC
  Filled 2019-09-08: qty 1

## 2019-09-08 MED ORDER — ACETAMINOPHEN 325 MG PO TABS
ORAL_TABLET | ORAL | Status: AC
  Filled 2019-09-08: qty 2

## 2019-09-08 MED ORDER — PROCHLORPERAZINE MALEATE 10 MG PO TABS
ORAL_TABLET | ORAL | Status: AC
  Filled 2019-09-08: qty 1

## 2019-09-08 MED ORDER — LORATADINE 10 MG PO TABS
10.0000 mg | ORAL_TABLET | Freq: Every day | ORAL | Status: DC
  Administered 2019-09-08: 10 mg via ORAL

## 2019-09-08 MED ORDER — PACLITAXEL PROTEIN-BOUND CHEMO INJECTION 100 MG
100.0000 mg/m2 | Freq: Once | INTRAVENOUS | Status: AC
  Administered 2019-09-08: 200 mg via INTRAVENOUS
  Filled 2019-09-08: qty 40

## 2019-09-08 MED ORDER — SODIUM CHLORIDE 0.9 % IV SOLN
Freq: Once | INTRAVENOUS | Status: AC
  Filled 2019-09-08: qty 250

## 2019-09-08 MED ORDER — SODIUM CHLORIDE 0.9% FLUSH
10.0000 mL | INTRAVENOUS | Status: DC | PRN
  Administered 2019-09-08: 10 mL
  Filled 2019-09-08: qty 10

## 2019-09-08 MED ORDER — SODIUM CHLORIDE 0.9% FLUSH
10.0000 mL | Freq: Once | INTRAVENOUS | Status: AC
  Administered 2019-09-08: 10 mL
  Filled 2019-09-08: qty 10

## 2019-09-08 MED ORDER — HEPARIN SOD (PORK) LOCK FLUSH 100 UNIT/ML IV SOLN
500.0000 [IU] | Freq: Once | INTRAVENOUS | Status: AC | PRN
  Administered 2019-09-08: 500 [IU]
  Filled 2019-09-08: qty 5

## 2019-09-08 MED ORDER — ACETAMINOPHEN 325 MG PO TABS
650.0000 mg | ORAL_TABLET | Freq: Once | ORAL | Status: AC
  Administered 2019-09-08: 650 mg via ORAL

## 2019-09-08 NOTE — Patient Instructions (Signed)
[facility] Discharge Instructions for Patients Receiving Chemotherapy  Today you received the following chemotherapy agents Trastuzumab-anns (KANJINTI) & Paclitaxel-protein (ABRAXANE).  To help prevent nausea and vomiting after your treatment, we encourage you to take your nausea medication as prescribed.  If you develop nausea and vomiting that is not controlled by your nausea medication, call the clinic.   BELOW ARE SYMPTOMS THAT SHOULD BE REPORTED IMMEDIATELY:  *FEVER GREATER THAN 100.5 F  *CHILLS WITH OR WITHOUT FEVER  NAUSEA AND VOMITING THAT IS NOT CONTROLLED WITH YOUR NAUSEA MEDICATION  *UNUSUAL SHORTNESS OF BREATH  *UNUSUAL BRUISING OR BLEEDING  TENDERNESS IN MOUTH AND THROAT WITH OR WITHOUT PRESENCE OF ULCERS  *URINARY PROBLEMS  *BOWEL PROBLEMS  UNUSUAL RASH Items with * indicate a potential emergency and should be followed up as soon as possible.  Feel free to call the clinic should you have any questions or concerns. The clinic phone number is [phone].  Please show the CHEMO ALERT CARD at check-in to the Emergency Department and triage nurse.   

## 2019-09-14 NOTE — Progress Notes (Signed)
Patient seen by Dr. Feng.   

## 2019-09-15 ENCOUNTER — Inpatient Hospital Stay: Payer: BC Managed Care – PPO

## 2019-09-15 ENCOUNTER — Other Ambulatory Visit: Payer: Self-pay

## 2019-09-15 ENCOUNTER — Inpatient Hospital Stay (HOSPITAL_BASED_OUTPATIENT_CLINIC_OR_DEPARTMENT_OTHER): Payer: BC Managed Care – PPO | Admitting: Hematology

## 2019-09-15 ENCOUNTER — Encounter: Payer: Self-pay | Admitting: *Deleted

## 2019-09-15 VITALS — BP 131/84 | HR 78 | Temp 98.5°F | Resp 16 | Wt 186.0 lb

## 2019-09-15 DIAGNOSIS — Z170 Estrogen receptor positive status [ER+]: Secondary | ICD-10-CM

## 2019-09-15 DIAGNOSIS — Z1509 Genetic susceptibility to other malignant neoplasm: Secondary | ICD-10-CM

## 2019-09-15 DIAGNOSIS — Z1589 Genetic susceptibility to other disease: Secondary | ICD-10-CM | POA: Diagnosis not present

## 2019-09-15 DIAGNOSIS — Z1501 Genetic susceptibility to malignant neoplasm of breast: Secondary | ICD-10-CM | POA: Diagnosis not present

## 2019-09-15 DIAGNOSIS — C50411 Malignant neoplasm of upper-outer quadrant of right female breast: Secondary | ICD-10-CM

## 2019-09-15 DIAGNOSIS — M79604 Pain in right leg: Secondary | ICD-10-CM | POA: Diagnosis not present

## 2019-09-15 DIAGNOSIS — Z1502 Genetic susceptibility to malignant neoplasm of ovary: Secondary | ICD-10-CM

## 2019-09-15 DIAGNOSIS — Z95828 Presence of other vascular implants and grafts: Secondary | ICD-10-CM

## 2019-09-15 DIAGNOSIS — Z5111 Encounter for antineoplastic chemotherapy: Secondary | ICD-10-CM | POA: Diagnosis not present

## 2019-09-15 DIAGNOSIS — Z5112 Encounter for antineoplastic immunotherapy: Secondary | ICD-10-CM | POA: Diagnosis not present

## 2019-09-15 LAB — CMP (CANCER CENTER ONLY)
ALT: 23 U/L (ref 0–44)
AST: 16 U/L (ref 15–41)
Albumin: 3.6 g/dL (ref 3.5–5.0)
Alkaline Phosphatase: 75 U/L (ref 38–126)
Anion gap: 8 (ref 5–15)
BUN: 16 mg/dL (ref 6–20)
CO2: 22 mmol/L (ref 22–32)
Calcium: 10.8 mg/dL — ABNORMAL HIGH (ref 8.9–10.3)
Chloride: 111 mmol/L (ref 98–111)
Creatinine: 0.75 mg/dL (ref 0.44–1.00)
GFR, Est AFR Am: >60 mL/min (ref >60)
GFR, Estimated: >60 mL/min (ref >60)
Glucose, Bld: 121 mg/dL — ABNORMAL HIGH (ref 70–99)
Potassium: 4.2 mmol/L (ref 3.5–5.1)
Sodium: 141 mmol/L (ref 135–145)
Total Bilirubin: 0.3 mg/dL (ref 0.3–1.2)
Total Protein: 6.7 g/dL (ref 6.5–8.1)

## 2019-09-15 LAB — CBC WITH DIFFERENTIAL (CANCER CENTER ONLY)
Abs Immature Granulocytes: 0.04 10*3/uL (ref 0.00–0.07)
Basophils Absolute: 0.1 10*3/uL (ref 0.0–0.1)
Basophils Relative: 1 %
Eosinophils Absolute: 0.2 10*3/uL (ref 0.0–0.5)
Eosinophils Relative: 3 %
HCT: 37.6 % (ref 36.0–46.0)
Hemoglobin: 13 g/dL (ref 12.0–15.0)
Immature Granulocytes: 1 %
Lymphocytes Relative: 41 %
Lymphs Abs: 2.3 10*3/uL (ref 0.7–4.0)
MCH: 32.5 pg (ref 26.0–34.0)
MCHC: 34.6 g/dL (ref 30.0–36.0)
MCV: 94 fL (ref 80.0–100.0)
Monocytes Absolute: 0.3 10*3/uL (ref 0.1–1.0)
Monocytes Relative: 6 %
Neutro Abs: 2.7 10*3/uL (ref 1.7–7.7)
Neutrophils Relative %: 48 %
Platelet Count: 283 10*3/uL (ref 150–400)
RBC: 4 MIL/uL (ref 3.87–5.11)
RDW: 14.9 % (ref 11.5–15.5)
WBC Count: 5.6 10*3/uL (ref 4.0–10.5)
nRBC: 0 % (ref 0.0–0.2)

## 2019-09-15 MED ORDER — LORATADINE 10 MG PO TABS
ORAL_TABLET | ORAL | Status: AC
  Filled 2019-09-15: qty 1

## 2019-09-15 MED ORDER — PROCHLORPERAZINE MALEATE 10 MG PO TABS
10.0000 mg | ORAL_TABLET | Freq: Once | ORAL | Status: AC
  Administered 2019-09-15: 10 mg via ORAL

## 2019-09-15 MED ORDER — HEPARIN SOD (PORK) LOCK FLUSH 100 UNIT/ML IV SOLN
500.0000 [IU] | Freq: Once | INTRAVENOUS | Status: AC | PRN
  Administered 2019-09-15: 500 [IU]
  Filled 2019-09-15: qty 5

## 2019-09-15 MED ORDER — LORATADINE 10 MG PO TABS
10.0000 mg | ORAL_TABLET | Freq: Every day | ORAL | Status: DC
  Administered 2019-09-15: 10 mg via ORAL

## 2019-09-15 MED ORDER — SODIUM CHLORIDE 0.9 % IV SOLN
Freq: Once | INTRAVENOUS | Status: AC
  Filled 2019-09-15: qty 250

## 2019-09-15 MED ORDER — PROCHLORPERAZINE MALEATE 10 MG PO TABS
ORAL_TABLET | ORAL | Status: AC
  Filled 2019-09-15: qty 1

## 2019-09-15 MED ORDER — TRASTUZUMAB-ANNS CHEMO 150 MG IV SOLR
2.0000 mg/kg | Freq: Once | INTRAVENOUS | Status: AC
  Administered 2019-09-15: 168 mg via INTRAVENOUS
  Filled 2019-09-15: qty 8

## 2019-09-15 MED ORDER — SODIUM CHLORIDE 0.9 % IV SOLN
20.0000 mg | Freq: Once | INTRAVENOUS | Status: AC
  Administered 2019-09-15: 20 mg via INTRAVENOUS
  Filled 2019-09-15: qty 20

## 2019-09-15 MED ORDER — SODIUM CHLORIDE 0.9% FLUSH
10.0000 mL | INTRAVENOUS | Status: DC | PRN
  Administered 2019-09-15: 10 mL
  Filled 2019-09-15: qty 10

## 2019-09-15 MED ORDER — SODIUM CHLORIDE 0.9% FLUSH
10.0000 mL | Freq: Once | INTRAVENOUS | Status: AC
  Administered 2019-09-15: 10 mL
  Filled 2019-09-15: qty 10

## 2019-09-15 MED ORDER — SODIUM CHLORIDE 0.9 % IV SOLN
80.0000 mg/m2 | Freq: Once | INTRAVENOUS | Status: AC
  Administered 2019-09-15: 162 mg via INTRAVENOUS
  Filled 2019-09-15: qty 27

## 2019-09-15 MED ORDER — FAMOTIDINE IN NACL 20-0.9 MG/50ML-% IV SOLN
INTRAVENOUS | Status: AC
  Filled 2019-09-15: qty 50

## 2019-09-15 MED ORDER — FAMOTIDINE IN NACL 20-0.9 MG/50ML-% IV SOLN
20.0000 mg | Freq: Once | INTRAVENOUS | Status: AC
  Administered 2019-09-15: 20 mg via INTRAVENOUS

## 2019-09-15 NOTE — Patient Instructions (Addendum)
[facility] Discharge Instructions for Patients Receiving Chemotherapy  Today you received the following chemotherapy agents: trastuzumab and paclitaxel.  To help prevent nausea and vomiting after your treatment, we encourage you to take your nausea medication as directed.   If you develop nausea and vomiting that is not controlled by your nausea medication, call the clinic.   BELOW ARE SYMPTOMS THAT SHOULD BE REPORTED IMMEDIATELY:  *FEVER GREATER THAN 100.5 F  *CHILLS WITH OR WITHOUT FEVER  NAUSEA AND VOMITING THAT IS NOT CONTROLLED WITH YOUR NAUSEA MEDICATION  *UNUSUAL SHORTNESS OF BREATH  *UNUSUAL BRUISING OR BLEEDING  TENDERNESS IN MOUTH AND THROAT WITH OR WITHOUT PRESENCE OF ULCERS  *URINARY PROBLEMS  *BOWEL PROBLEMS  UNUSUAL RASH Items with * indicate a potential emergency and should be followed up as soon as possible.  Feel free to call the clinic should you have any questions or concerns. The clinic phone number is [phone].  Please show the [place] at check-in to the Emergency Department and triage nurse.  Paclitaxel injection What is this medicine? PACLITAXEL (PAK li TAX el) is a chemotherapy drug. It targets fast dividing cells, like cancer cells, and causes these cells to die. This medicine is used to treat ovarian cancer, breast cancer, lung cancer, Kaposi's sarcoma, and other cancers. This medicine may be used for other purposes; ask your health care provider or pharmacist if you have questions. COMMON BRAND NAME(S): Onxol, Taxol What should I tell my health care provider before I take this medicine? They need to know if you have any of these conditions:  history of irregular heartbeat  liver disease  low blood counts, like low white cell, platelet, or red cell counts  lung or breathing disease, like asthma  tingling of the fingers or toes, or other nerve disorder  an unusual or allergic reaction to paclitaxel, alcohol,  polyoxyethylated castor oil, other chemotherapy, other medicines, foods, dyes, or preservatives  pregnant or trying to get pregnant  breast-feeding How should I use this medicine? This drug is given as an infusion into a vein. It is administered in a hospital or clinic by a specially trained health care professional. Talk to your pediatrician regarding the use of this medicine in children. Special care may be needed. Overdosage: If you think you have taken too much of this medicine contact a poison control center or emergency room at once. NOTE: This medicine is only for you. Do not share this medicine with others. What if I miss a dose? It is important not to miss your dose. Call your doctor or health care professional if you are unable to keep an appointment. What may interact with this medicine? Do not take this medicine with any of the following medications:  disulfiram  metronidazole This medicine may also interact with the following medications:  antiviral medicines for hepatitis, HIV or AIDS  certain antibiotics like erythromycin and clarithromycin  certain medicines for fungal infections like ketoconazole and itraconazole  certain medicines for seizures like carbamazepine, phenobarbital, phenytoin  gemfibrozil  nefazodone  rifampin  St. John's wort This list may not describe all possible interactions. Give your health care provider a list of all the medicines, herbs, non-prescription drugs, or dietary supplements you use. Also tell them if you smoke, drink alcohol, or use illegal drugs. Some items may interact with your medicine. What should I watch for while using this medicine? Your condition will be monitored carefully while you are receiving this medicine. You will need important  blood work done while you are taking this medicine. This medicine can cause serious allergic reactions. To reduce your risk you will need to take other medicine(s) before treatment with this  medicine. If you experience allergic reactions like skin rash, itching or hives, swelling of the face, lips, or tongue, tell your doctor or health care professional right away. In some cases, you may be given additional medicines to help with side effects. Follow all directions for their use. This drug may make you feel generally unwell. This is not uncommon, as chemotherapy can affect healthy cells as well as cancer cells. Report any side effects. Continue your course of treatment even though you feel ill unless your doctor tells you to stop. Call your doctor or health care professional for advice if you get a fever, chills or sore throat, or other symptoms of a cold or flu. Do not treat yourself. This drug decreases your body's ability to fight infections. Try to avoid being around people who are sick. This medicine may increase your risk to bruise or bleed. Call your doctor or health care professional if you notice any unusual bleeding. Be careful brushing and flossing your teeth or using a toothpick because you may get an infection or bleed more easily. If you have any dental work done, tell your dentist you are receiving this medicine. Avoid taking products that contain aspirin, acetaminophen, ibuprofen, naproxen, or ketoprofen unless instructed by your doctor. These medicines may hide a fever. Do not become pregnant while taking this medicine. Women should inform their doctor if they wish to become pregnant or think they might be pregnant. There is a potential for serious side effects to an unborn child. Talk to your health care professional or pharmacist for more information. Do not breast-feed an infant while taking this medicine. Men are advised not to father a child while receiving this medicine. This product may contain alcohol. Ask your pharmacist or healthcare provider if this medicine contains alcohol. Be sure to tell all healthcare providers you are taking this medicine. Certain medicines,  like metronidazole and disulfiram, can cause an unpleasant reaction when taken with alcohol. The reaction includes flushing, headache, nausea, vomiting, sweating, and increased thirst. The reaction can last from 30 minutes to several hours. What side effects may I notice from receiving this medicine? Side effects that you should report to your doctor or health care professional as soon as possible:  allergic reactions like skin rash, itching or hives, swelling of the face, lips, or tongue  breathing problems  changes in vision  fast, irregular heartbeat  high or low blood pressure  mouth sores  pain, tingling, numbness in the hands or feet  signs of decreased platelets or bleeding - bruising, pinpoint red spots on the skin, black, tarry stools, blood in the urine  signs of decreased red blood cells - unusually weak or tired, feeling faint or lightheaded, falls  signs of infection - fever or chills, cough, sore throat, pain or difficulty passing urine  signs and symptoms of liver injury like dark yellow or brown urine; general ill feeling or flu-like symptoms; light-colored stools; loss of appetite; nausea; right upper belly pain; unusually weak or tired; yellowing of the eyes or skin  swelling of the ankles, feet, hands  unusually slow heartbeat Side effects that usually do not require medical attention (report to your doctor or health care professional if they continue or are bothersome):  diarrhea  hair loss  loss of appetite  muscle or joint   pain  nausea, vomiting  pain, redness, or irritation at site where injected  tiredness This list may not describe all possible side effects. Call your doctor for medical advice about side effects. You may report side effects to FDA at 1-800-FDA-1088. Where should I keep my medicine? This drug is given in a hospital or clinic and will not be stored at home. NOTE: This sheet is a summary. It may not cover all possible information.  If you have questions about this medicine, talk to your doctor, pharmacist, or health care provider.  2020 Elsevier/Gold Standard (2016-09-23 13:14:55)

## 2019-09-16 ENCOUNTER — Other Ambulatory Visit: Payer: Self-pay | Admitting: Hematology

## 2019-09-16 ENCOUNTER — Other Ambulatory Visit: Payer: Self-pay | Admitting: Medical

## 2019-09-16 ENCOUNTER — Telehealth: Payer: Self-pay

## 2019-09-16 DIAGNOSIS — F419 Anxiety disorder, unspecified: Secondary | ICD-10-CM

## 2019-09-16 MED ORDER — LORAZEPAM 0.5 MG PO TABS: 0.5000 mg | ORAL_TABLET | Freq: Three times a day (TID) | ORAL | 0 refills | Status: DC | PRN

## 2019-09-16 NOTE — Telephone Encounter (Signed)
I spoke with Abigail Berg.  She has had 2 loose stools otherwise is feeling fine.  She was unable to sleep last pm r/t decadron.

## 2019-09-17 ENCOUNTER — Encounter: Payer: Self-pay | Admitting: Hematology

## 2019-09-17 NOTE — Progress Notes (Signed)
[facility]   [phone] [phone]   Clinic Follow up Note   Patient Care Team: Patient, [name] as PCP - General ([place]) Minus Breeding, MD as PCP - Cardiology (Cardiology) Armbruster, Carlota Raspberry, MD as Consulting Physician (Gastroenterology) Eyvonne Mechanic as Counselor (Licensed Clinical Social Worker) Abigail Merle, MD as Consulting Physician (Hematology) Alla Feeling, NP as Nurse Practitioner (Nurse Practitioner) Mauro Kaufmann, RN as Oncology Nurse Navigator Rockwell Germany, RN as Oncology Nurse Navigator Rolm Bookbinder, MD as Consulting Physician (General Surgery)  Date of Service:  09/15/2019  CHIEF COMPLAINT: F/u ofRight breast cancer, on adjuvant chemo   SUMMARY OF ONCOLOGIC HISTORY: Oncology History Overview Note  Cancer Staging Malignant neoplasm of upper-outer quadrant of right breast in female, estrogen receptor positive ([place]) Staging form: Breast, AJCC 8th Edition - Clinical stage from 06/01/2019: Stage IA (cT1c, cN0, cM0, G3, ER+, PR-, HER2+) - Signed by Abigail Merle, MD on 06/17/2019 - Pathologic stage from 06/30/2019: Stage IA (pT1c, pN0, cM0, G3, ER+, PR-, HER2+) - Signed by Abigail Merle, MD on 07/15/2019    Malignant neoplasm of upper-outer quadrant of right breast in female, estrogen receptor positive ([place])  10/2018 -  Anti-estrogen oral therapy   She was preventively started on Tamoxifen since 10/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 Held after 06/17/19 to proceed with her breast cancer surgery and chemo.    05/06/2019 Breast MRI   IMPRESSION Enhancing right breast mass suspicious for malignancy. Second look [place] if recommended.  No Suspicious enhancing left breast masses.    05/24/2019 Breast US   IMPRESSION 1.8cm irregular right breast mass within the 9:30 position 3.5cm from the nipple is highly suggestive of malignancy. An Ultrasound guided breast biopsy is recommended. The findings of the study and recommendation for  biopsy were discussed with the patient directly.    06/01/2019 Cancer Staging   Staging form: Breast, AJCC 8th Edition - Clinical stage from 06/01/2019: Stage IA (cT1c, cN0, cM0, G3, ER+, PR-, HER2+) - Signed by Abigail Merle, MD on 06/17/2019   06/01/2019 Initial Biopsy   Breast Biopsy - Right Breast Core Bx DIAGNOSIS INFILTRATING DUCT CARCINOMA WITH MINOR DUCTAL CARCINOMA IN SITU COMPNENT OF RIGHT BREAST, CORE NEEDLE BIOPSY    06/01/2019 Receptors her2   ER: greater than 75% of tumor cells show moderate staining  PR: Negative  HER2: Positive 3+ Ki67: 40%   06/17/2019 Initial Diagnosis   Malignant neoplasm of upper-outer quadrant of right breast in female, estrogen receptor positive ([place])   06/30/2019 Surgery   RIGHT BREAST LUMPECTOMY WITH RADIOACTIVE SEED AND RIGHT AXILLARY SENTINEL NODE BIOPSY and PAC placement  by Dr Donne Hazel     06/30/2019 Pathology Results   FINAL MICROSCOPIC DIAGNOSIS:   A. BREAST, RIGHT, LUMPECTOMY:  - Invasive ductal carcinoma, grade 3, spanning 1.5 cm.  - Intermediate grade ductal carcinoma in situ.  - Invasive carcinoma is 0.1-0.2 from the final lateral margin (part E).  - Margins are negative for in situ carcinoma.  - Biopsy site.  - See oncology table.   B. LYMPH NODE, RIGHT AXILLARY, SENTINEL, BIOPSY:  - One of one lymph nodes negative for carcinoma (0/1).   C. LYMPH NODE, RIGHT AXILLARY, SENTINEL, BIOPSY:  - One of one lymph nodes negative for carcinoma (0/1).   D. LYMPH NODE, RIGHT AXILLARY, SENTINEL, BIOPSY:  - One of one lymph nodes negative for carcinoma (0/1).   E. BREAST, RIGHT ADDITIONAL LATERAL MARGIN, EXCISION:  - Invasive ductal carcinoma.  -  Invasive carcinoma is 0.1-0.2 cm from the new lateral margin.   F. BREAST, RIGHT ADDITIONAL POSTERIOR MARGIN, EXCISION:  - Fibrocystic change and usual ductal hyperplasia.  - No malignancy identified.   G. BREAST, RIGHT ADDITIONAL SUPERIOR MARGIN, EXCISION:  - Fibrocystic change and usual ductal  hyperplasia.  - Incidental radial scar.  - No malignancy identified.     PROGNOSTIC INDICATOR RESULTS:  The tumor cells are POSITIVE for Her2 (3+).  Estrogen Receptor:       POSITIVE, 85%, MODERATE STAINING  Progesterone Receptor:   NEGATIVE  Proliferation Marker Ki-67:   25%    06/30/2019 Cancer Staging   Staging form: Breast, AJCC 8th Edition - Pathologic stage from 06/30/2019: Stage IA (pT1c, pN0, cM0, G3, ER+, PR-, HER2+) - Signed by Abigail Merle, MD on 07/15/2019   07/29/2019 -  Chemotherapy   Adjuvant Weekly Abraxane and Herceptin for 12 weeks starting 07/29/19, followed by maintenance Herceptin q3weeks to complete 1 year of treatment.       CURRENT THERAPY:  Adjuvant Weekly Abraxane and Transtuzumab for 12 weeks starting 07/29/19, followed by maintenance Herceptin q3weeks to complete 1 year of treatment.   INTERVAL HISTORY:  Abigail Berg is here for a follow up and treatment. I saw her in the infusion room.  She tolerated congenitally very well, no headaches after infusion.  However her insurance now denied Abraxane, so it will be changed to Taxol today.  She is overall tolerating adjuvant treatment well, no neuropathy, weight is stable.  No new complaints.  Review of system otherwise negative.   MEDICAL HISTORY:  Past Medical History:  Diagnosis Date  . Depression   . Family history of colon cancer   . Monoallelic mutation of CHEK2 gene in female patient   . Personal history of colonic polyps   . S/P partial colectomy     SURGICAL HISTORY: Past Surgical History:  Procedure Laterality Date  . BREAST LUMPECTOMY WITH RADIOACTIVE SEED AND SENTINEL LYMPH NODE BIOPSY Right 06/30/2019   Procedure: RIGHT BREAST LUMPECTOMY WITH RADIOACTIVE SEED AND RIGHT AXILLARY SENTINEL NODE BIOPSY;  Surgeon: Rolm Bookbinder, MD;  Location: [facility];  Service: General;  Laterality: Right;  PEC BLOCK  . BREAST SURGERY    . COLON SURGERY     Partial colectomy  . COLONOSCOPY     . POLYPECTOMY    . PORTACATH PLACEMENT Left 06/30/2019   Procedure: INSERTION PORT-A-CATH WITH ULTRASOUND GUIDANCE;  Surgeon: Rolm Bookbinder, MD;  Location: [facility];  Service: General;  Laterality: Left;    I have reviewed the social history and family history with the patient and they are unchanged from previous note.  ALLERGIES:  has No Known Allergies.  MEDICATIONS:  Current Outpatient Medications  Medication Sig Dispense Refill  . acetaminophen (TYLENOL) 325 MG tablet Take 650 mg by mouth every 6 (six) hours as needed for headache.    . escitalopram (LEXAPRO) 20 MG tablet Take 20 mg by mouth daily.     Marland Kitchen ibuprofen (ADVIL) 200 MG tablet Take 200 mg by mouth every 6 (six) hours as needed for headache or mild pain.    Marland Kitchen lidocaine-prilocaine (EMLA) cream Apply to affected area once 30 g 3  . LORazepam (ATIVAN) 0.5 MG tablet Take 1 tablet (0.5 mg total) by mouth every 8 (eight) hours as needed for anxiety. 20 tablet 0  . Multiple Vitamins-Minerals (CENTRUM WOMEN PO) Take 1 tablet by mouth daily.    . ondansetron (ZOFRAN) 8 MG tablet Take 1  tablet (8 mg total) by mouth 2 (two) times daily as needed (Nausea or vomiting). 30 tablet 1  . prochlorperazine (COMPAZINE) 10 MG tablet Take 1 tablet (10 mg total) by mouth every 6 (six) hours as needed (Nausea or vomiting). 30 tablet 1   No current facility-administered medications for this visit.    PHYSICAL EXAMINATION: ECOG PERFORMANCE STATUS: 1 - Symptomatic but completely ambulatory Blood pressure 131/84, heart rate is 78, respiratory 16, pulse ox 100% on room air GENERAL:alert, no distress and comfortable SKIN: skin color, texture, turgor are normal, no rashes or significant lesions Musculoskeletal:no cyanosis of digits and no clubbing  NEURO: alert & oriented x 3 with fluent speech, no focal motor/sensory deficits  LABORATORY DATA:  I have reviewed the data as listed CBC Latest Ref Rng & Units 09/15/2019 09/08/2019  09/01/2019  WBC 4.0 - 10.5 K/uL 5.6 5.9 5.1  Hemoglobin 12.0 - 15.0 g/dL 13.0 13.2 13.1  Hematocrit 36 - 46 % 37.6 38.2 37.9  Platelets 150 - 400 K/uL 283 279 286     CMP Latest Ref Rng & Units 09/15/2019 09/08/2019 09/01/2019  Glucose 70 - 99 mg/dL 121(H) 101(H) 117(H)  BUN 6 - 20 mg/dL _0 Creatinine 0.44 - 1.00 mg/dL 0.75 0.73 0.78  Sodium 135 - 145 mmol/L 141 139 140  Potassium 3.5 - 5.1 mmol/L 4.2 4.2 4.2  Chloride 98 - 111 mmol/L 111 109 109  CO2 22 - 32 mmol/L _1 Calcium 8.9 - 10.3 mg/dL 10.8(H) 11.4(H) 11.4(H)  Total Protein 6.5 - 8.1 g/dL 6.7 7.0 6.8  Total Bilirubin 0.3 - 1.2 mg/dL 0.3 0.3 0.3  Alkaline Phos 38 - 126 U/L 75 79 82  AST 15 - 41 U/L 16 22 14(L)  ALT 0 - 44 U/L _2 RADIOGRAPHIC STUDIES: I have personally reviewed the radiological images as listed and agreed with the findings in the report. No results found.   ASSESSMENT & PLAN:  Jenya Putz is a 54 y.o. female with    1.Malignant neoplasm of upper-outer quadrant of right breast,Stage1A,p(T1cN0M0),stage IA,ER+/PR-/HER2+, Grade3. -She was diagnosed in 05/2019. She underwent right breast lumpectomy with SLNB by Dr Donne Hazel on 06/30/19. -I previously discussed her HER-2 positive breast cancer is more aggressive, and predicts high risk of recurrence.  -To reduce her high risk of recurrence, I started her on chemotherapy with weekly Abraxane and Anti-HER2 Herceptin for 12 weeks beginning 07/29/19. Afterward she will proceed with maintenance Herceptin q3weeks to complete 1 year of treatment. Goal of therapy is curative.  --s/p 7 weeks adjuvant treatment.  Due to posttreatment headaches, Ogivri was changed to kanjinti and she tolerated well.  Her insurance now denied Abraxane, so we will change to paclitaxel today.  I reviewed the potential side effects with her, especially allergy reactions, she agrees to proceed. -Lab reviewed, adequate for treatment, continue weekly treatment.  2.  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 -Wepreviouslydiscussed that CHEK2 1100delC mutation is associated with 2-4 times high risk of breast cancer than general population and her moderate increased risk of colon cancer. Her estimated lifetime risk of breast cancer is probably around 25-35% -She will continueannual screening breast MRI in addition to mammogram,self breast exam, and physician breast exam twice a year.Her GYN will order for her to be done locally. -She will f/u with Dr. Havery Moros for screening colonoscopies, last done 07/2018 with12polyps. I recommend to  repeat in next 2 years.  -She started chemoprevention withTamoxifen once daily in 10/2018, but stopped on 06/17/19 to proceed with start of breast cancer adjuvant chemo treatment.    3. Health Maintenance, Smoking Cessation  -She willcontinue f/u with PCP, OB/Gyn for routine health maintenance and other age appropriate cancer screenings -Wepreviouslydiscussed maintaining a healthy lifestyle. -She has been smoking for 25 years. She is currently smoking 1/2ppd. She has mild elevated Hg and can negatively effect her overall health. I have reviewedsmoking cessation with her.   4. S/p ileocecectomy on 08/14/15 due to benign colon polyp invading appendix, history of recurrent colon polyps  5.H/o right breast fibroadenoma,S/p rightsurgical resection on 11/22/15  6. Hypercalcemia, likelyprimary hyperparathyroidism -She is not on Calcium or Vit D, but takes MVI  -Her PTH was within normal limits, but on the high end. PTHrp was negative. This is likelyprimary hyperparathyroidism -Will refer her to endocrinology after she completes adjuvant chemotherapy -I recommend her to take a multivitamin which contains low-dose calcium     PLAN: -Due to her insurance denial of Abraxane, will change it to paclitaxel today, chemo consent  obtained. -Lab reviewed, adequate for treatment, will proceed back to Taxol and Kanjinti today and continue weekly  -f/u in 2 weeks    No problem-specific Assessment & Plan notes found for this encounter.   No orders of the defined types were placed in this encounter.  All questions were answered. The patient knows to call the clinic with any problems, questions or concerns. No barriers to learning was detected.      Abigail Merle, MD 09/15/2019

## 2019-09-22 ENCOUNTER — Other Ambulatory Visit: Payer: Self-pay

## 2019-09-22 ENCOUNTER — Inpatient Hospital Stay: Payer: BC Managed Care – PPO

## 2019-09-22 VITALS — BP 120/70 | HR 73 | Temp 98.3°F | Resp 16 | Wt 184.2 lb

## 2019-09-22 DIAGNOSIS — C50411 Malignant neoplasm of upper-outer quadrant of right female breast: Secondary | ICD-10-CM

## 2019-09-22 DIAGNOSIS — Z5111 Encounter for antineoplastic chemotherapy: Secondary | ICD-10-CM | POA: Diagnosis not present

## 2019-09-22 DIAGNOSIS — Z95828 Presence of other vascular implants and grafts: Secondary | ICD-10-CM

## 2019-09-22 DIAGNOSIS — Z170 Estrogen receptor positive status [ER+]: Secondary | ICD-10-CM

## 2019-09-22 DIAGNOSIS — Z5112 Encounter for antineoplastic immunotherapy: Secondary | ICD-10-CM | POA: Diagnosis not present

## 2019-09-22 DIAGNOSIS — Z1501 Genetic susceptibility to malignant neoplasm of breast: Secondary | ICD-10-CM

## 2019-09-22 DIAGNOSIS — M79604 Pain in right leg: Secondary | ICD-10-CM | POA: Diagnosis not present

## 2019-09-22 LAB — CMP (CANCER CENTER ONLY)
ALT: 18 U/L (ref 0–44)
AST: 22 U/L (ref 15–41)
Albumin: 3.7 g/dL (ref 3.5–5.0)
Alkaline Phosphatase: 72 U/L (ref 38–126)
Anion gap: 9 (ref 5–15)
BUN: 15 mg/dL (ref 6–20)
CO2: 22 mmol/L (ref 22–32)
Calcium: 11.1 mg/dL — ABNORMAL HIGH (ref 8.9–10.3)
Chloride: 108 mmol/L (ref 98–111)
Creatinine: 0.89 mg/dL (ref 0.44–1.00)
GFR, Est AFR Am: >60 mL/min (ref >60)
GFR, Estimated: >60 mL/min (ref >60)
Glucose, Bld: 108 mg/dL — ABNORMAL HIGH (ref 70–99)
Potassium: 4.5 mmol/L (ref 3.5–5.1)
Sodium: 139 mmol/L (ref 135–145)
Total Bilirubin: 0.3 mg/dL (ref 0.3–1.2)
Total Protein: 7 g/dL (ref 6.5–8.1)

## 2019-09-22 LAB — CBC WITH DIFFERENTIAL (CANCER CENTER ONLY)
Abs Immature Granulocytes: 0.05 10*3/uL (ref 0.00–0.07)
Basophils Absolute: 0.1 10*3/uL (ref 0.0–0.1)
Basophils Relative: 1 %
Eosinophils Absolute: 0.1 10*3/uL (ref 0.0–0.5)
Eosinophils Relative: 3 %
HCT: 39.7 % (ref 36.0–46.0)
Hemoglobin: 14 g/dL (ref 12.0–15.0)
Immature Granulocytes: 1 %
Lymphocytes Relative: 30 %
Lymphs Abs: 1.7 10*3/uL (ref 0.7–4.0)
MCH: 32.7 pg (ref 26.0–34.0)
MCHC: 35.3 g/dL (ref 30.0–36.0)
MCV: 92.8 fL (ref 80.0–100.0)
Monocytes Absolute: 0.3 10*3/uL (ref 0.1–1.0)
Monocytes Relative: 6 %
Neutro Abs: 3.4 10*3/uL (ref 1.7–7.7)
Neutrophils Relative %: 59 %
Platelet Count: 285 10*3/uL (ref 150–400)
RBC: 4.28 MIL/uL (ref 3.87–5.11)
RDW: 15.2 % (ref 11.5–15.5)
WBC Count: 5.7 10*3/uL (ref 4.0–10.5)
nRBC: 0 % (ref 0.0–0.2)

## 2019-09-22 MED ORDER — SODIUM CHLORIDE 0.9 % IV SOLN
80.0000 mg/m2 | Freq: Once | INTRAVENOUS | Status: AC
  Administered 2019-09-22: 162 mg via INTRAVENOUS
  Filled 2019-09-22: qty 27

## 2019-09-22 MED ORDER — LORATADINE 10 MG PO TABS
10.0000 mg | ORAL_TABLET | Freq: Every day | ORAL | Status: DC
  Administered 2019-09-22: 10 mg via ORAL

## 2019-09-22 MED ORDER — SODIUM CHLORIDE 0.9% FLUSH
10.0000 mL | Freq: Once | INTRAVENOUS | Status: AC
  Administered 2019-09-22: 10 mL
  Filled 2019-09-22: qty 10

## 2019-09-22 MED ORDER — FAMOTIDINE IN NACL 20-0.9 MG/50ML-% IV SOLN
INTRAVENOUS | Status: AC
  Filled 2019-09-22: qty 50

## 2019-09-22 MED ORDER — PROCHLORPERAZINE MALEATE 10 MG PO TABS
ORAL_TABLET | ORAL | Status: AC
  Filled 2019-09-22: qty 1

## 2019-09-22 MED ORDER — HEPARIN SOD (PORK) LOCK FLUSH 100 UNIT/ML IV SOLN
500.0000 [IU] | Freq: Once | INTRAVENOUS | Status: AC | PRN
  Administered 2019-09-22: 500 [IU]
  Filled 2019-09-22: qty 5

## 2019-09-22 MED ORDER — SODIUM CHLORIDE 0.9% FLUSH
10.0000 mL | INTRAVENOUS | Status: DC | PRN
  Administered 2019-09-22: 10 mL
  Filled 2019-09-22: qty 10

## 2019-09-22 MED ORDER — PROCHLORPERAZINE MALEATE 10 MG PO TABS
10.0000 mg | ORAL_TABLET | Freq: Once | ORAL | Status: AC
  Administered 2019-09-22: 10 mg via ORAL

## 2019-09-22 MED ORDER — SODIUM CHLORIDE 0.9 % IV SOLN
Freq: Once | INTRAVENOUS | Status: AC
  Filled 2019-09-22: qty 250

## 2019-09-22 MED ORDER — LORATADINE 10 MG PO TABS
ORAL_TABLET | ORAL | Status: AC
  Filled 2019-09-22: qty 1

## 2019-09-22 MED ORDER — FAMOTIDINE IN NACL 20-0.9 MG/50ML-% IV SOLN
20.0000 mg | Freq: Once | INTRAVENOUS | Status: AC
  Administered 2019-09-22: 20 mg via INTRAVENOUS

## 2019-09-22 MED ORDER — TRASTUZUMAB-ANNS CHEMO 150 MG IV SOLR
2.0000 mg/kg | Freq: Once | INTRAVENOUS | Status: AC
  Administered 2019-09-22: 168 mg via INTRAVENOUS
  Filled 2019-09-22: qty 8

## 2019-09-22 MED ORDER — SODIUM CHLORIDE 0.9 % IV SOLN
20.0000 mg | Freq: Once | INTRAVENOUS | Status: AC
  Administered 2019-09-22: 20 mg via INTRAVENOUS
  Filled 2019-09-22: qty 20

## 2019-09-22 NOTE — Patient Instructions (Signed)
[facility] Discharge Instructions for Patients Receiving Chemotherapy  Today you received the following chemotherapy agents Trastuzumab-anns Calla Kicks) & Taxol  To help prevent nausea and vomiting after your treatment, we encourage you to take your nausea medication as prescribed.   If you develop nausea and vomiting that is not controlled by your nausea medication, call the clinic.   BELOW ARE SYMPTOMS THAT SHOULD BE REPORTED IMMEDIATELY:  *FEVER GREATER THAN 100.5 F  *CHILLS WITH OR WITHOUT FEVER  NAUSEA AND VOMITING THAT IS NOT CONTROLLED WITH YOUR NAUSEA MEDICATION  *UNUSUAL SHORTNESS OF BREATH  *UNUSUAL BRUISING OR BLEEDING  TENDERNESS IN MOUTH AND THROAT WITH OR WITHOUT PRESENCE OF ULCERS  *URINARY PROBLEMS  *BOWEL PROBLEMS  UNUSUAL RASH Items with * indicate a potential emergency and should be followed up as soon as possible.  Feel free to call the clinic should you have any questions or concerns. The clinic phone number is [phone].  Please show the [place] at check-in to the Emergency Department and triage nurse.

## 2019-09-22 NOTE — Patient Instructions (Signed)

## 2019-09-26 ENCOUNTER — Other Ambulatory Visit: Payer: Self-pay

## 2019-09-26 ENCOUNTER — Ambulatory Visit: Payer: BC Managed Care – PPO | Attending: General Surgery

## 2019-09-26 DIAGNOSIS — Z170 Estrogen receptor positive status [ER+]: Secondary | ICD-10-CM | POA: Insufficient documentation

## 2019-09-26 DIAGNOSIS — C50411 Malignant neoplasm of upper-outer quadrant of right female breast: Secondary | ICD-10-CM | POA: Insufficient documentation

## 2019-09-26 NOTE — Therapy (Signed)
[place], [place], [address] Phone: [phone]   Fax:  [phone]  Physical Therapy Treatment  Patient Details  Name: Abigail Berg MRN: [id number] Date of Birth: September 01, 1965 Referring Provider (PT): Donne Hazel   Encounter Date: 09/26/2019   PT End of Session - 09/26/19 1602    Visit Number 2   # unchanged due to screen only   Number of Visits 2    PT Start Time 1551    PT Stop Time 1600    PT Time Calculation (min) 9 min    Activity Tolerance Patient tolerated treatment well    Behavior During Therapy [facility] for tasks assessed/performed           Past Medical History:  Diagnosis Date  . Depression   . Family history of colon cancer   . Monoallelic mutation of CHEK2 gene in female patient   . Personal history of colonic polyps   . S/P partial colectomy     Past Surgical History:  Procedure Laterality Date  . BREAST LUMPECTOMY WITH RADIOACTIVE SEED AND SENTINEL LYMPH NODE BIOPSY Right 06/30/2019   Procedure: RIGHT BREAST LUMPECTOMY WITH RADIOACTIVE SEED AND RIGHT AXILLARY SENTINEL NODE BIOPSY;  Surgeon: Rolm Bookbinder, MD;  Location: [facility];  Service: General;  Laterality: Right;  PEC BLOCK  . BREAST SURGERY    . COLON SURGERY     Partial colectomy  . COLONOSCOPY    . POLYPECTOMY    . PORTACATH PLACEMENT Left 06/30/2019   Procedure: INSERTION PORT-A-CATH WITH ULTRASOUND GUIDANCE;  Surgeon: Rolm Bookbinder, MD;  Location: [facility];  Service: General;  Laterality: Left;    There were no vitals filed for this visit.   Subjective Assessment - 09/26/19 1555    Subjective Pt returns for 3 month L-Dex screen.    Pertinent History R breast cancer, ER +, PR -, HER 2+, will undergo R breast lumpectomy and SLNB on 06/30/19, stopped Tamoxifen a week ago                  L-DEX FLOWSHEETS - 09/26/19 1600      L-DEX LYMPHEDEMA SCREENING   BASELINE SCORE  (UNILATERAL) -0.3    L-DEX SCORE (UNILATERAL) -0.6    VALUE CHANGE (UNILAT) -0.3                                  PT Long Term Goals - 07/28/19 1157      PT LONG TERM GOAL #1   Title Pt will return to baseline shoulder ROM following R breast lumpectomy.    Time 4    Period Weeks    Status Achieved                 Plan - 09/26/19 1602    Clinical Impression Statement Pt returns for 3 month L-Dex screen. Her change from baseline of -0.3 is within normal range and no further treatment is indicated at this time. She is agreeable to return in 3 months for L-Dex screen.    PT Next Visit Plan Cont every 3 month L-Dex screens.    Consulted and Agree with Plan of Care Patient           Patient will benefit from skilled therapeutic intervention in order to improve the following deficits and impairments:     Visit Diagnosis: Malignant neoplasm of upper-outer quadrant of right breast  in female, estrogen receptor positive [facility])     Problem List Patient Active Problem List   Diagnosis Date Noted  . Port-A-Cath in place 07/29/2019  . Malignant neoplasm of upper-outer quadrant of right breast in female, estrogen receptor positive ([place]) 06/17/2019  . Genetic testing 09/27/2018  . Monoallelic mutation of CHEK2 gene in female patient   . Family history of colon cancer   . Personal history of colonic polyps     Otelia Limes, PTA 09/26/2019, 4:04 PM  [place], [place], [address] Phone: [phone]   Fax:  [phone]  Name: Abigail Berg MRN: [id number] Date of Birth: 1965-02-21

## 2019-09-28 NOTE — Progress Notes (Signed)
Abigail Berg   [phone] [phone]   Clinic Follow up Note   Patient Care Team: Patient, [name] as PCP - General ([place]) Minus Breeding, MD as PCP - Cardiology (Cardiology) Armbruster, Carlota Raspberry, MD as Consulting Physician (Gastroenterology) Eyvonne Mechanic as Counselor (Licensed Clinical Social Worker) Truitt Merle, MD as Consulting Physician (Hematology) Alla Feeling, NP as Nurse Practitioner (Nurse Practitioner) Mauro Kaufmann, RN as Oncology Nurse Navigator Rockwell Germany, RN as Oncology Nurse Navigator Rolm Bookbinder, MD as Consulting Physician (General Surgery)  Date of Service:  09/29/2019  CHIEF COMPLAINT:  F/u ofRight breast cancer, on adjuvant chemo   SUMMARY OF ONCOLOGIC HISTORY: Oncology History Overview Note  Cancer Staging Malignant neoplasm of upper-outer quadrant of right breast in female, estrogen receptor positive ([place]) Staging form: Breast, AJCC 8th Edition - Clinical stage from 06/01/2019: Stage IA (cT1c, cN0, cM0, G3, ER+, PR-, HER2+) - Signed by Truitt Merle, MD on 06/17/2019 - Pathologic stage from 06/30/2019: Stage IA (pT1c, pN0, cM0, G3, ER+, PR-, HER2+) - Signed by Truitt Merle, MD on 07/15/2019    Malignant neoplasm of upper-outer quadrant of right breast in female, estrogen receptor positive ([place])  10/2018 -  Anti-estrogen oral therapy   She was preventively started on Tamoxifen since 10/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 Held after 06/17/19 to proceed with her breast cancer surgery and chemo.    05/06/2019 Breast MRI   IMPRESSION Enhancing right breast mass suspicious for malignancy. Second look [place] if recommended.  No Suspicious enhancing left breast masses.    05/24/2019 Breast US   IMPRESSION 1.8cm irregular right breast mass within the 9:30 position 3.5cm from the nipple is highly suggestive of malignancy. An Ultrasound guided breast biopsy is recommended. The findings of the study and recommendation for  biopsy were discussed with the patient directly.    06/01/2019 Cancer Staging   Staging form: Breast, AJCC 8th Edition - Clinical stage from 06/01/2019: Stage IA (cT1c, cN0, cM0, G3, ER+, PR-, HER2+) - Signed by Truitt Merle, MD on 06/17/2019   06/01/2019 Initial Biopsy   Breast Biopsy - Right Breast Core Bx DIAGNOSIS INFILTRATING DUCT CARCINOMA WITH MINOR DUCTAL CARCINOMA IN SITU COMPNENT OF RIGHT BREAST, CORE NEEDLE BIOPSY    06/01/2019 Receptors her2   ER: greater than 75% of tumor cells show moderate staining  PR: Negative  HER2: Positive 3+ Ki67: 40%   06/17/2019 Initial Diagnosis   Malignant neoplasm of upper-outer quadrant of right breast in female, estrogen receptor positive ([place])   06/30/2019 Surgery   RIGHT BREAST LUMPECTOMY WITH RADIOACTIVE SEED AND RIGHT AXILLARY SENTINEL NODE BIOPSY and PAC placement  by Dr Donne Hazel     06/30/2019 Pathology Results   FINAL MICROSCOPIC DIAGNOSIS:   A. BREAST, RIGHT, LUMPECTOMY:  - Invasive ductal carcinoma, grade 3, spanning 1.5 cm.  - Intermediate grade ductal carcinoma in situ.  - Invasive carcinoma is 0.1-0.2 from the final lateral margin (part E).  - Margins are negative for in situ carcinoma.  - Biopsy site.  - See oncology table.   B. LYMPH NODE, RIGHT AXILLARY, SENTINEL, BIOPSY:  - One of one lymph nodes negative for carcinoma (0/1).   C. LYMPH NODE, RIGHT AXILLARY, SENTINEL, BIOPSY:  - One of one lymph nodes negative for carcinoma (0/1).   D. LYMPH NODE, RIGHT AXILLARY, SENTINEL, BIOPSY:  - One of one lymph nodes negative for carcinoma (0/1).   E. BREAST, RIGHT ADDITIONAL LATERAL MARGIN, EXCISION:  - Invasive ductal carcinoma.  -  Invasive carcinoma is 0.1-0.2 cm from the new lateral margin.   F. BREAST, RIGHT ADDITIONAL POSTERIOR MARGIN, EXCISION:  - Fibrocystic change and usual ductal hyperplasia.  - No malignancy identified.   G. BREAST, RIGHT ADDITIONAL SUPERIOR MARGIN, EXCISION:  - Fibrocystic change and usual ductal  hyperplasia.  - Incidental radial scar.  - No malignancy identified.     PROGNOSTIC INDICATOR RESULTS:  The tumor cells are POSITIVE for Her2 (3+).  Estrogen Receptor:       POSITIVE, 85%, MODERATE STAINING  Progesterone Receptor:   NEGATIVE  Proliferation Marker Ki-67:   25%    06/30/2019 Cancer Staging   Staging form: Breast, AJCC 8th Edition - Pathologic stage from 06/30/2019: Stage IA (pT1c, pN0, cM0, G3, ER+, PR-, HER2+) - Signed by Truitt Merle, MD on 07/15/2019   07/29/2019 -  Chemotherapy   Adjuvant Weekly Abraxane and Transtuzumab for 12 weeks starting 07/29/19, followed by maintenance Herceptin q3weeks to complete 1 year of treatment. Due to her insurance denial of Abraxane, we changed it to paclitaxel starting 09/15/19      CURRENT THERAPY:  Adjuvant Weekly Abraxane and Transtuzumab for 12 weeks starting 07/29/19, followed by maintenance Herceptin q3weeks to complete 1 year of treatment.Due to her insurance denial of Abraxane, we changed it to paclitaxel starting 09/15/19  INTERVAL HISTORY:  Abigail Berg is here for a follow up and treatment. She presents to the clinic with sister. She notes she is doing well except leg pain, but mainly of right leg. She denies back pain. She notes this is intermittent, but mostly present. She notes this effects her sleep. She has tried Advil and Tylenol alternating q2hours but this is not helping. She is willing to try stronger medication as long as she does not have to reduce her treatment.    REVIEW OF SYSTEMS:   Constitutional: Denies fevers, chills or abnormal weight loss Eyes: Denies blurriness of vision Ears, nose, mouth, throat, and face: Denies mucositis or sore throat Respiratory: Denies cough, dyspnea or wheezes Cardiovascular: Denies palpitation, chest discomfort or lower extremity swelling Gastrointestinal:  Denies nausea, heartburn or change in bowel habits Skin: Denies abnormal skin rashes MSK: (+) Leg pain, R>L Lymphatics: Denies  new lymphadenopathy or easy bruising Neurological:Denies numbness, tingling or new weaknesses Behavioral/Psych: Mood is stable, no new changes  All other systems were reviewed with the patient and are negative.  MEDICAL HISTORY:  Past Medical History:  Diagnosis Date  . Depression   . Family history of colon cancer   . Monoallelic mutation of CHEK2 gene in female patient   . Personal history of colonic polyps   . S/P partial colectomy     SURGICAL HISTORY: Past Surgical History:  Procedure Laterality Date  . BREAST LUMPECTOMY WITH RADIOACTIVE SEED AND SENTINEL LYMPH NODE BIOPSY Right 06/30/2019   Procedure: RIGHT BREAST LUMPECTOMY WITH RADIOACTIVE SEED AND RIGHT AXILLARY SENTINEL NODE BIOPSY;  Surgeon: Rolm Bookbinder, MD;  Location: [facility];  Service: General;  Laterality: Right;  PEC BLOCK  . BREAST SURGERY    . COLON SURGERY     Partial colectomy  . COLONOSCOPY    . POLYPECTOMY    . PORTACATH PLACEMENT Left 06/30/2019   Procedure: INSERTION PORT-A-CATH WITH ULTRASOUND GUIDANCE;  Surgeon: Rolm Bookbinder, MD;  Location: [facility];  Service: General;  Laterality: Left;    I have reviewed the social history and family history with the patient and they are unchanged from previous note.  ALLERGIES:  has No Known  Allergies.  MEDICATIONS:  Current Outpatient Medications  Medication Sig Dispense Refill  . acetaminophen (TYLENOL) 325 MG tablet Take 650 mg by mouth every 6 (six) hours as needed for headache.    . escitalopram (LEXAPRO) 20 MG tablet Take 20 mg by mouth daily.     Marland Kitchen gabapentin (NEURONTIN) 300 MG capsule Take 1 capsule (300 mg total) by mouth 3 (three) times daily. 30 capsule 0  . ibuprofen (ADVIL) 200 MG tablet Take 200 mg by mouth every 6 (six) hours as needed for headache or mild pain.    Marland Kitchen lidocaine-prilocaine (EMLA) cream Apply to affected area once 30 g 3  . LORazepam (ATIVAN) 0.5 MG tablet Take 1 tablet (0.5 mg total) by  mouth every 8 (eight) hours as needed for anxiety. 20 tablet 0  . Multiple Vitamins-Minerals (CENTRUM WOMEN PO) Take 1 tablet by mouth daily.    . ondansetron (ZOFRAN) 8 MG tablet Take 1 tablet (8 mg total) by mouth 2 (two) times daily as needed (Nausea or vomiting). 30 tablet 1  . prochlorperazine (COMPAZINE) 10 MG tablet Take 1 tablet (10 mg total) by mouth every 6 (six) hours as needed (Nausea or vomiting). 30 tablet 1  . traMADol (ULTRAM) 50 MG tablet Take 1 tablet (50 mg total) by mouth every 6 (six) hours as needed. 15 tablet 0   No current facility-administered medications for this visit.   Facility-Administered Medications Ordered in Other Visits  Medication Dose Route Frequency Provider Last Rate Last Admin  . 0.9 %  sodium chloride infusion   Intravenous Once Truitt Merle, MD      . dexamethasone (DECADRON) 20 mg in sodium chloride 0.9 % 50 mL IVPB  20 mg Intravenous Once Cira Rue K, NP      . famotidine (PEPCID) IVPB 20 mg premix  20 mg Intravenous Once Cira Rue K, NP      . heparin lock flush 100 unit/mL  500 Units Intracatheter Once PRN Truitt Merle, MD      . loratadine (CLARITIN) tablet 10 mg  10 mg Oral Daily Truitt Merle, MD      . PACLitaxel (TAXOL) 162 mg in sodium chloride 0.9 % 250 mL chemo infusion (</= 78m/m2)  80 mg/m2 (Treatment Plan Recorded) Intravenous Once BAlla Feeling NP      . prochlorperazine (COMPAZINE) tablet 10 mg  10 mg Oral Once FTruitt Merle MD      . sodium chloride flush (NS) 0.9 % injection 10 mL  10 mL Intracatheter PRN FTruitt Merle MD      . tTheotis Burrow([facility] 168 mg in sodium chloride 0.9 % 250 mL chemo infusion  2 mg/kg (Treatment Plan Recorded) Intravenous Once FTruitt Merle MD        PHYSICAL EXAMINATION: ECOG PERFORMANCE STATUS: 1 - Symptomatic but completely ambulatory  Vitals:   09/29/19 1254  BP: 134/73  Pulse: 81  Resp: 18  Temp: 98 F (36.7 C)  SpO2: 99%   Filed Weights   09/29/19 1254  Weight: 186 lb 8 oz (84.6 kg)     Due to COVID19 we will limit examination to appearance. Patient had no complaints.  GENERAL:alert, no distress and comfortable SKIN: skin color normal, no rashes or significant lesions EYES: normal, Conjunctiva are pink and non-injected, sclera clear  NEURO: alert & oriented x 3 with fluent speech   LABORATORY DATA:  I have reviewed the data as listed CBC Latest Ref Rng & Units 09/29/2019 09/22/2019 09/15/2019  WBC 4.0 - 10.5 K/uL  5.6 5.7 5.6  Hemoglobin 12.0 - 15.0 g/dL 13.2 14.0 13.0  Hematocrit 36 - 46 % 38.0 39.7 37.6  Platelets 150 - 400 K/uL 220 285 283     CMP Latest Ref Rng & Units 09/29/2019 09/22/2019 09/15/2019  Glucose 70 - 99 mg/dL 112(H) 108(H) 121(H)  BUN 6 - 20 mg/dL _0 Creatinine 0.44 - 1.00 mg/dL 0.80 0.89 0.75  Sodium 135 - 145 mmol/L 139 139 141  Potassium 3.5 - 5.1 mmol/L 4.2 4.5 4.2  Chloride 98 - 111 mmol/L 109 108 111  CO2 22 - 32 mmol/L _1 Calcium 8.9 - 10.3 mg/dL 11.0(H) 11.1(H) 10.8(H)  Total Protein 6.5 - 8.1 g/dL 6.5 7.0 6.7  Total Bilirubin 0.3 - 1.2 mg/dL 0.3 0.3 0.3  Alkaline Phos 38 - 126 U/L 77 72 75  AST 15 - 41 U/L _2 ALT 0 - 44 U/L _3 RADIOGRAPHIC STUDIES: I have personally reviewed the radiological images as listed and agreed with the findings in the report. No results found.   ASSESSMENT & PLAN:  Abigail Berg is a 54 y.o. female with    1.Malignant neoplasm of upper-outer quadrant of right breast,Stage1A,p(T1cN0M0),stage IA,ER+/PR-/HER2+, Grade3. -She was diagnosed in 05/2019. She underwent right breast lumpectomy with SLNB by Dr Donne Hazel on 06/30/19. -Ipreviouslydiscussed herHER-2 positive breast cancerismore aggressive, and predicts high risk of recurrence.  -To reduce her high risk of recurrence, I started her on chemotherapy withweekly Abraxaneand Anti-HER2 Herceptinfor 12 weeks beginning 07/29/19. Afterward she will proceed withmaintenance Herceptin q3weeks to complete 1 year of  treatment.Goal of therapy is curative.  -Due to headaches her Herceptin (Ogivr) was switched to Herceptin Calla Kicks) on 09/08/19. Due to her insurance denial of Abraxane, I changed it to paclitaxel starting 09/15/19. -S/p week 9 she is tolerating chemo well except significant leg pain. I reviewed management with her. If tramadol and Gabapentin is not enough I will dose reduce her chemo for week 11-12. She voiced good understanding.  -Labs reviewed and adequate to proceed with week 10 Taxol and Herceptin (Kanjinti) today. -After she completed 12 weeks of chemo she will proceed with adjuvant radiation. Per requests to have RT done in [place] as it is more convenient. I will refer her to a local Rad Onc Dr. Rhunette Croft  -She will continue Trastuzumab q3weeks after chemo. Will check if her insurance will cover injections.  -After Radiation she will proceed with Antiestrogen therapy.  -Continue to f/u weekly for last two weeks chemo   2. 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 -Wepreviouslydiscussed that CHEK2 1100delC mutation is associated with 2-4 times high risk of breast cancer than general populationand her moderate increased risk of colon cancer.Her estimated lifetime risk of breast cancer is probably around 25-35% -She will continueannual screening breast MRI in addition to mammogram,self breast exam, and physician breast exam twice a year.Her GYN will order for her to be done locally. -She will f/u with Dr. Havery Moros for screening colonoscopies, last done 07/2018 with12polyps. I recommend to repeat in next 2 years.  -She started chemoprevention withTamoxifen once daily in 10/2018, but stopped on 06/17/19 to proceed with start of breast cancer adjuvant chemo treatment.  3. Health Maintenance, Smoking Cessation  -She willcontinue f/u with PCP, OB/Gyn for routine health maintenance and other age appropriate cancer  screenings -Wepreviouslydiscussed maintaining a healthy lifestyle. -She has been smoking for 25 years.  She is currently smoking 1/2ppd. She has mild elevated Hg and can negatively effect her overall health. I have reviewedsmoking cessation with her.   4. S/p ileocecectomy on 08/14/15 due to benign colon polyp invading appendix, history of recurrent colon polyps  5.H/o right breast fibroadenoma,S/p rightsurgical resection on 11/22/15  6. Hypercalcemia, likelyprimary hyperparathyroidism -She is not on Calcium or Vit D, but takes MVI  -Her PTH was within normal limits, but on the high end. PTHrp was negative. This is likelyprimary hyperparathyroidism -Will refer her toendocrinology after she completes adjuvant chemotherapy -I recommend her to take a multivitamin which contains low-dose calcium   7. Leg pain, R>L -She has recent intermittent leg pain, mainly in the right leg starting day 2-3 and lasts about a week. This has effected her sleep. -She has tried Tylenol alternating  With ibuprofen q2hr without much relief.  -I will also call in Tramadol which I reviewed is a controlled substance and can cause dependence and constipation. She can use for only severe pain. I will call in Gabapentin to help the nerve related pain. She can try nightly at first as this does cause drowsiness.  -I advised her not to take Gabapentin and Tramadol at the same time.  -I discussed this is likely related to chemotherapy. If pain is not improved with Tramadol and Gabapentin I will reduce dose for last 1-2 cycles.    PLAN: -I called in Tramadol and Gabapentin today she will use as needed, not at same time  -Lab reviewed, adequate for treatment, will proceed Taxol and Kanjinti today and continue weekly  -f/u next week   -refer to [facility] at [facility]    No problem-specific [facility] notes found for this encounter.   No orders of the defined types were placed in this  encounter.  All questions were answered. The patient knows to call the clinic with any problems, questions or concerns. No barriers to learning was detected. The total time spent in the appointment was 30 minutes.     Truitt Merle, MD 09/29/2019   I, Joslyn Devon, am acting as scribe for Truitt Merle, MD.   I have reviewed the above documentation for accuracy and completeness, and I agree with the above.

## 2019-09-29 ENCOUNTER — Other Ambulatory Visit: Payer: Self-pay

## 2019-09-29 ENCOUNTER — Inpatient Hospital Stay: Payer: BC Managed Care – PPO

## 2019-09-29 ENCOUNTER — Encounter: Payer: Self-pay | Admitting: Hematology

## 2019-09-29 ENCOUNTER — Encounter: Payer: Self-pay | Admitting: *Deleted

## 2019-09-29 ENCOUNTER — Inpatient Hospital Stay (HOSPITAL_BASED_OUTPATIENT_CLINIC_OR_DEPARTMENT_OTHER): Payer: BC Managed Care – PPO | Admitting: Hematology

## 2019-09-29 VITALS — BP 134/73 | HR 81 | Temp 98.0°F | Resp 18 | Ht 68.0 in | Wt 186.5 lb

## 2019-09-29 DIAGNOSIS — Z170 Estrogen receptor positive status [ER+]: Secondary | ICD-10-CM

## 2019-09-29 DIAGNOSIS — Z1589 Genetic susceptibility to other disease: Secondary | ICD-10-CM

## 2019-09-29 DIAGNOSIS — Z1509 Genetic susceptibility to other malignant neoplasm: Secondary | ICD-10-CM

## 2019-09-29 DIAGNOSIS — C50411 Malignant neoplasm of upper-outer quadrant of right female breast: Secondary | ICD-10-CM

## 2019-09-29 DIAGNOSIS — Z1501 Genetic susceptibility to malignant neoplasm of breast: Secondary | ICD-10-CM

## 2019-09-29 DIAGNOSIS — Z5111 Encounter for antineoplastic chemotherapy: Secondary | ICD-10-CM | POA: Diagnosis not present

## 2019-09-29 DIAGNOSIS — M79604 Pain in right leg: Secondary | ICD-10-CM | POA: Diagnosis not present

## 2019-09-29 DIAGNOSIS — Z1502 Genetic susceptibility to malignant neoplasm of ovary: Secondary | ICD-10-CM

## 2019-09-29 DIAGNOSIS — Z95828 Presence of other vascular implants and grafts: Secondary | ICD-10-CM

## 2019-09-29 DIAGNOSIS — Z5112 Encounter for antineoplastic immunotherapy: Secondary | ICD-10-CM | POA: Diagnosis not present

## 2019-09-29 LAB — CMP (CANCER CENTER ONLY)
ALT: 21 U/L (ref 0–44)
AST: 17 U/L (ref 15–41)
Albumin: 3.5 g/dL (ref 3.5–5.0)
Alkaline Phosphatase: 77 U/L (ref 38–126)
Anion gap: 5 (ref 5–15)
BUN: 15 mg/dL (ref 6–20)
CO2: 25 mmol/L (ref 22–32)
Calcium: 11 mg/dL — ABNORMAL HIGH (ref 8.9–10.3)
Chloride: 109 mmol/L (ref 98–111)
Creatinine: 0.8 mg/dL (ref 0.44–1.00)
GFR, Est AFR Am: >60 mL/min (ref >60)
GFR, Estimated: >60 mL/min (ref >60)
Glucose, Bld: 112 mg/dL — ABNORMAL HIGH (ref 70–99)
Potassium: 4.2 mmol/L (ref 3.5–5.1)
Sodium: 139 mmol/L (ref 135–145)
Total Bilirubin: 0.3 mg/dL (ref 0.3–1.2)
Total Protein: 6.5 g/dL (ref 6.5–8.1)

## 2019-09-29 LAB — CBC WITH DIFFERENTIAL (CANCER CENTER ONLY)
Abs Immature Granulocytes: 0.06 10*3/uL (ref 0.00–0.07)
Basophils Absolute: 0.1 10*3/uL (ref 0.0–0.1)
Basophils Relative: 1 %
Eosinophils Absolute: 0.2 10*3/uL (ref 0.0–0.5)
Eosinophils Relative: 3 %
HCT: 38 % (ref 36.0–46.0)
Hemoglobin: 13.2 g/dL (ref 12.0–15.0)
Immature Granulocytes: 1 %
Lymphocytes Relative: 41 %
Lymphs Abs: 2.3 10*3/uL (ref 0.7–4.0)
MCH: 32.2 pg (ref 26.0–34.0)
MCHC: 34.7 g/dL (ref 30.0–36.0)
MCV: 92.7 fL (ref 80.0–100.0)
Monocytes Absolute: 0.3 10*3/uL (ref 0.1–1.0)
Monocytes Relative: 5 %
Neutro Abs: 2.7 10*3/uL (ref 1.7–7.7)
Neutrophils Relative %: 49 %
Platelet Count: 220 10*3/uL (ref 150–400)
RBC: 4.1 MIL/uL (ref 3.87–5.11)
RDW: 15.2 % (ref 11.5–15.5)
WBC Count: 5.6 10*3/uL (ref 4.0–10.5)
nRBC: 0 % (ref 0.0–0.2)

## 2019-09-29 MED ORDER — LORATADINE 10 MG PO TABS
ORAL_TABLET | ORAL | Status: AC
  Filled 2019-09-29: qty 1

## 2019-09-29 MED ORDER — GABAPENTIN 300 MG PO CAPS: 300.0000 mg | ORAL_CAPSULE | Freq: Three times a day (TID) | ORAL | 0 refills | Status: DC

## 2019-09-29 MED ORDER — SODIUM CHLORIDE 0.9% FLUSH
10.0000 mL | INTRAVENOUS | Status: DC | PRN
  Administered 2019-09-29: 10 mL
  Filled 2019-09-29: qty 10

## 2019-09-29 MED ORDER — PROCHLORPERAZINE MALEATE 10 MG PO TABS
10.0000 mg | ORAL_TABLET | Freq: Once | ORAL | Status: AC
  Administered 2019-09-29: 10 mg via ORAL

## 2019-09-29 MED ORDER — SODIUM CHLORIDE 0.9 % IV SOLN
Freq: Once | INTRAVENOUS | Status: AC
  Filled 2019-09-29: qty 250

## 2019-09-29 MED ORDER — SODIUM CHLORIDE 0.9 % IV SOLN
80.0000 mg/m2 | Freq: Once | INTRAVENOUS | Status: AC
  Administered 2019-09-29: 162 mg via INTRAVENOUS
  Filled 2019-09-29: qty 27

## 2019-09-29 MED ORDER — TRAMADOL HCL 50 MG PO TABS: 50.0000 mg | ORAL_TABLET | Freq: Four times a day (QID) | ORAL | 0 refills | Status: DC | PRN

## 2019-09-29 MED ORDER — SODIUM CHLORIDE 0.9% FLUSH
10.0000 mL | Freq: Once | INTRAVENOUS | Status: AC
  Administered 2019-09-29: 10 mL
  Filled 2019-09-29: qty 10

## 2019-09-29 MED ORDER — FAMOTIDINE IN NACL 20-0.9 MG/50ML-% IV SOLN
INTRAVENOUS | Status: AC
  Filled 2019-09-29: qty 50

## 2019-09-29 MED ORDER — FAMOTIDINE IN NACL 20-0.9 MG/50ML-% IV SOLN
20.0000 mg | Freq: Once | INTRAVENOUS | Status: AC
  Administered 2019-09-29: 20 mg via INTRAVENOUS

## 2019-09-29 MED ORDER — LORATADINE 10 MG PO TABS
10.0000 mg | ORAL_TABLET | Freq: Every day | ORAL | Status: DC
  Administered 2019-09-29: 10 mg via ORAL

## 2019-09-29 MED ORDER — SODIUM CHLORIDE 0.9 % IV SOLN
20.0000 mg | Freq: Once | INTRAVENOUS | Status: AC
  Administered 2019-09-29: 20 mg via INTRAVENOUS
  Filled 2019-09-29: qty 20

## 2019-09-29 MED ORDER — PROCHLORPERAZINE MALEATE 10 MG PO TABS
ORAL_TABLET | ORAL | Status: AC
  Filled 2019-09-29: qty 1

## 2019-09-29 MED ORDER — TRASTUZUMAB-ANNS CHEMO 150 MG IV SOLR
2.0000 mg/kg | Freq: Once | INTRAVENOUS | Status: AC
  Administered 2019-09-29: 168 mg via INTRAVENOUS
  Filled 2019-09-29: qty 8

## 2019-09-29 MED ORDER — HEPARIN SOD (PORK) LOCK FLUSH 100 UNIT/ML IV SOLN
500.0000 [IU] | Freq: Once | INTRAVENOUS | Status: AC | PRN
  Administered 2019-09-29: 500 [IU]
  Filled 2019-09-29: qty 5

## 2019-09-29 NOTE — Patient Instructions (Addendum)
[facility] Discharge Instructions for Patients Receiving Chemotherapy  Today you received the following chemotherapy agents: Kanjinti, Taxol  To help prevent nausea and vomiting after your treatment, we encourage you to take your nausea medication as directed.    If you develop nausea and vomiting that is not controlled by your nausea medication, call the clinic.   BELOW ARE SYMPTOMS THAT SHOULD BE REPORTED IMMEDIATELY:  *FEVER GREATER THAN 100.5 F  *CHILLS WITH OR WITHOUT FEVER  NAUSEA AND VOMITING THAT IS NOT CONTROLLED WITH YOUR NAUSEA MEDICATION  *UNUSUAL SHORTNESS OF BREATH  *UNUSUAL BRUISING OR BLEEDING  TENDERNESS IN MOUTH AND THROAT WITH OR WITHOUT PRESENCE OF ULCERS  *URINARY PROBLEMS  *BOWEL PROBLEMS  UNUSUAL RASH Items with * indicate a potential emergency and should be followed up as soon as possible.  Feel free to call the clinic should you have any questions or concerns. The clinic phone number is [phone].  Please show the CHEMO ALERT CARD at check-in to the Emergency Department and triage nurse.   

## 2019-10-05 ENCOUNTER — Other Ambulatory Visit: Payer: Self-pay

## 2019-10-05 DIAGNOSIS — C50411 Malignant neoplasm of upper-outer quadrant of right female breast: Secondary | ICD-10-CM

## 2019-10-05 DIAGNOSIS — Z1501 Genetic susceptibility to malignant neoplasm of breast: Secondary | ICD-10-CM

## 2019-10-05 DIAGNOSIS — Z1509 Genetic susceptibility to other malignant neoplasm: Secondary | ICD-10-CM

## 2019-10-05 NOTE — Progress Notes (Signed)
[place]   [phone] Fax:([phone]   Clinic Follow up Note   Patient Care Team: Patient, [name] as PCP - General ([place]) Minus Breeding, MD as PCP - Cardiology (Cardiology) Armbruster, Carlota Raspberry, MD as Consulting Physician (Gastroenterology) Eyvonne Mechanic as Counselor (Licensed Clinical Social Worker) Truitt Merle, MD as Consulting Physician (Hematology) Alla Feeling, NP as Nurse Practitioner (Nurse Practitioner) Mauro Kaufmann, RN as Oncology Nurse Navigator Rockwell Germany, RN as Oncology Nurse Navigator Rolm Bookbinder, MD as Consulting Physician (General Surgery)  Date of Service:  10/06/2019  CHIEF COMPLAINT: F/u ofRight breast cancer  SUMMARY OF ONCOLOGIC HISTORY: Oncology History Overview Note  Cancer Staging Malignant neoplasm of upper-outer quadrant of right breast in female, estrogen receptor positive ([place]) Staging form: Breast, AJCC 8th Edition - Clinical stage from 06/01/2019: Stage IA (cT1c, cN0, cM0, G3, ER+, PR-, HER2+) - Signed by Truitt Merle, MD on 06/17/2019 - Pathologic stage from 06/30/2019: Stage IA (pT1c, pN0, cM0, G3, ER+, PR-, HER2+) - Signed by Truitt Merle, MD on 07/15/2019    Malignant neoplasm of upper-outer quadrant of right breast in female, estrogen receptor positive ([place])  10/2018 -  Anti-estrogen oral therapy   She was preventively started on Tamoxifen since 10/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 Held after 06/17/19 to proceed with her breast cancer surgery and chemo.    05/06/2019 Breast MRI   IMPRESSION Enhancing right breast mass suspicious for malignancy. Second look [place] if recommended.  No Suspicious enhancing left breast masses.    05/24/2019 Breast US   IMPRESSION 1.8cm irregular right breast mass within the 9:30 position 3.5cm from the nipple is highly suggestive of malignancy. An Ultrasound guided breast biopsy is recommended. The findings of the study and recommendation for biopsy were discussed  with the patient directly.    06/01/2019 Cancer Staging   Staging form: Breast, AJCC 8th Edition - Clinical stage from 06/01/2019: Stage IA (cT1c, cN0, cM0, G3, ER+, PR-, HER2+) - Signed by Truitt Merle, MD on 06/17/2019   06/01/2019 Initial Biopsy   Breast Biopsy - Right Breast Core Bx DIAGNOSIS INFILTRATING DUCT CARCINOMA WITH MINOR DUCTAL CARCINOMA IN SITU COMPNENT OF RIGHT BREAST, CORE NEEDLE BIOPSY    06/01/2019 Receptors her2   ER: greater than 75% of tumor cells show moderate staining  PR: Negative  HER2: Positive 3+ Ki67: 40%   06/17/2019 Initial Diagnosis   Malignant neoplasm of upper-outer quadrant of right breast in female, estrogen receptor positive ([place])   06/30/2019 Surgery   RIGHT BREAST LUMPECTOMY WITH RADIOACTIVE SEED AND RIGHT AXILLARY SENTINEL NODE BIOPSY and PAC placement  by Dr Donne Hazel     06/30/2019 Pathology Results   FINAL MICROSCOPIC DIAGNOSIS:   A. BREAST, RIGHT, LUMPECTOMY:  - Invasive ductal carcinoma, grade 3, spanning 1.5 cm.  - Intermediate grade ductal carcinoma in situ.  - Invasive carcinoma is 0.1-0.2 from the final lateral margin (part E).  - Margins are negative for in situ carcinoma.  - Biopsy site.  - See oncology table.   B. LYMPH NODE, RIGHT AXILLARY, SENTINEL, BIOPSY:  - One of one lymph nodes negative for carcinoma (0/1).   C. LYMPH NODE, RIGHT AXILLARY, SENTINEL, BIOPSY:  - One of one lymph nodes negative for carcinoma (0/1).   D. LYMPH NODE, RIGHT AXILLARY, SENTINEL, BIOPSY:  - One of one lymph nodes negative for carcinoma (0/1).   E. BREAST, RIGHT ADDITIONAL LATERAL MARGIN, EXCISION:  - Invasive ductal carcinoma.  - Invasive carcinoma is  0.1-0.2 cm from the new lateral margin.   F. BREAST, RIGHT ADDITIONAL POSTERIOR MARGIN, EXCISION:  - Fibrocystic change and usual ductal hyperplasia.  - No malignancy identified.   G. BREAST, RIGHT ADDITIONAL SUPERIOR MARGIN, EXCISION:  - Fibrocystic change and usual ductal hyperplasia.  -  Incidental radial scar.  - No malignancy identified.     PROGNOSTIC INDICATOR RESULTS:  The tumor cells are POSITIVE for Her2 (3+).  Estrogen Receptor:       POSITIVE, 85%, MODERATE STAINING  Progesterone Receptor:   NEGATIVE  Proliferation Marker Ki-67:   25%    06/30/2019 Cancer Staging   Staging form: Breast, AJCC 8th Edition - Pathologic stage from 06/30/2019: Stage IA (pT1c, pN0, cM0, G3, ER+, PR-, HER2+) - Signed by Truitt Merle, MD on 07/15/2019   07/29/2019 -  Chemotherapy   Adjuvant Weekly Abraxane and Transtuzumab for 12 weeks starting 07/29/19, followed by maintenance Herceptin q3weeks to complete 1 year of treatment. Due to her insurance denial of Abraxane, we changed it to paclitaxel starting 09/15/19      CURRENT THERAPY:  Adjuvant Weekly Abraxane andTranstuzumabfor 12 weeks starting 07/29/19, followed by maintenance Herceptin q3weeks to complete 1 year of treatment.Due to her insurance denial of Abraxane, we changed it to paclitaxel starting 09/15/19  INTERVAL HISTORY:  Abigail Berg is here for a follow up and treatment. She presents to the clinic with her friend.  Her back pain is much better overall with Neurontin and tramadol as needed.  She tolerated chemo last week well.  No other new complaints.  She has mild numbness on her feet, no neuropathy on her hands.  Appetite and energy level are good, review of systems otherwise negative.  MEDICAL HISTORY:  Past Medical History:  Diagnosis Date  . Depression   . Family history of colon cancer   . Monoallelic mutation of CHEK2 gene in female patient   . Personal history of colonic polyps   . S/P partial colectomy     SURGICAL HISTORY: Past Surgical History:  Procedure Laterality Date  . BREAST LUMPECTOMY WITH RADIOACTIVE SEED AND SENTINEL LYMPH NODE BIOPSY Right 06/30/2019   Procedure: RIGHT BREAST LUMPECTOMY WITH RADIOACTIVE SEED AND RIGHT AXILLARY SENTINEL NODE BIOPSY;  Surgeon: Rolm Bookbinder, MD;  Location: [facility];  Service: General;  Laterality: Right;  PEC BLOCK  . BREAST SURGERY    . COLON SURGERY     Partial colectomy  . COLONOSCOPY    . POLYPECTOMY    . PORTACATH PLACEMENT Left 06/30/2019   Procedure: INSERTION PORT-A-CATH WITH ULTRASOUND GUIDANCE;  Surgeon: Rolm Bookbinder, MD;  Location: [facility];  Service: General;  Laterality: Left;    I have reviewed the social history and family history with the patient and they are unchanged from previous note.  ALLERGIES:  has No Known Allergies.  MEDICATIONS:  Current Outpatient Medications  Medication Sig Dispense Refill  . acetaminophen (TYLENOL) 325 MG tablet Take 650 mg by mouth every 6 (six) hours as needed for headache.    . escitalopram (LEXAPRO) 20 MG tablet Take 20 mg by mouth daily.     Marland Kitchen gabapentin (NEURONTIN) 300 MG capsule Take 1 capsule (300 mg total) by mouth 3 (three) times daily. 30 capsule 0  . ibuprofen (ADVIL) 200 MG tablet Take 200 mg by mouth every 6 (six) hours as needed for headache or mild pain.    Marland Kitchen lidocaine-prilocaine (EMLA) cream Apply to affected area once 30 g 3  . LORazepam (ATIVAN) 0.5  MG tablet Take 1 tablet (0.5 mg total) by mouth every 8 (eight) hours as needed for anxiety. 20 tablet 0  . Multiple Vitamins-Minerals (CENTRUM WOMEN PO) Take 1 tablet by mouth daily.    . ondansetron (ZOFRAN) 8 MG tablet Take 1 tablet (8 mg total) by mouth 2 (two) times daily as needed (Nausea or vomiting). 30 tablet 1  . prochlorperazine (COMPAZINE) 10 MG tablet Take 1 tablet (10 mg total) by mouth every 6 (six) hours as needed (Nausea or vomiting). 30 tablet 1  . traMADol (ULTRAM) 50 MG tablet Take 1 tablet (50 mg total) by mouth every 6 (six) hours as needed. 15 tablet 0   No current facility-administered medications for this visit.   Facility-Administered Medications Ordered in Other Visits  Medication Dose Route Frequency Provider Last Rate Last Admin  . 0.9 %  sodium chloride infusion    Intravenous Once Truitt Merle, MD      . dexamethasone (DECADRON) 20 mg in sodium chloride 0.9 % 50 mL IVPB  20 mg Intravenous Once Cira Rue K, NP      . famotidine (PEPCID) IVPB 20 mg premix  20 mg Intravenous Once Cira Rue K, NP      . heparin lock flush 100 unit/mL  500 Units Intracatheter Once PRN Truitt Merle, MD      . loratadine (CLARITIN) tablet 10 mg  10 mg Oral Daily Truitt Merle, MD      . PACLitaxel (TAXOL) 162 mg in sodium chloride 0.9 % 250 mL chemo infusion (</= 63m/m2)  80 mg/m2 (Treatment Plan Recorded) Intravenous Once BAlla Feeling NP      . prochlorperazine (COMPAZINE) tablet 10 mg  10 mg Oral Once FTruitt Merle MD      . sodium chloride flush (NS) 0.9 % injection 10 mL  10 mL Intracatheter PRN FTruitt Merle MD      . tTheotis Burrow([facility] 168 mg in sodium chloride 0.9 % 250 mL chemo infusion  2 mg/kg (Treatment Plan Recorded) Intravenous Once FTruitt Merle MD        PHYSICAL EXAMINATION: ECOG PERFORMANCE STATUS: 0 - Asymptomatic  Vitals:   10/06/19 1309  BP: 117/81  Pulse: 78  Resp: 18  Temp: 98.1 F (36.7 C)  SpO2: 100%   Filed Weights   10/06/19 1309  Weight: 187 lb (84.8 kg)    GENERAL:alert, no distress and comfortable SKIN: skin color, texture, turgor are normal, no rashes or significant lesions EYES: normal, Conjunctiva are pink and non-injected, sclera clear Musculoskeletal:no cyanosis of digits and no clubbing  NEURO: alert & oriented x 3 with fluent speech, no focal motor/sensory deficits  LABORATORY DATA:  I have reviewed the data as listed CBC Latest Ref Rng & Units 10/06/2019 09/29/2019 09/22/2019  WBC 4.0 - 10.5 K/uL 6.5 5.6 5.7  Hemoglobin 12.0 - 15.0 g/dL 13.3 13.2 14.0  Hematocrit 36 - 46 % 38.2 38.0 39.7  Platelets 150 - 400 K/uL 286 220 285     CMP Latest Ref Rng & Units 10/06/2019 09/29/2019 09/22/2019  Glucose 70 - 99 mg/dL 129(H) 112(H) 108(H)  BUN 6 - 20 mg/dL _0 Creatinine 0.44 - 1.00 mg/dL 0.79 0.80 0.89  Sodium 135 - 145  mmol/L 140 139 139  Potassium 3.5 - 5.1 mmol/L 4.1 4.2 4.5  Chloride 98 - 111 mmol/L 107 109 108  CO2 22 - 32 mmol/L _1 Calcium 8.9 - 10.3 mg/dL 10.6(H) 11.0(H) 11.1(H)  Total Protein 6.5 - 8.1  g/dL 6.7 6.5 7.0  Total Bilirubin 0.3 - 1.2 mg/dL 0.4 0.3 0.3  Alkaline Phos 38 - 126 U/L 80 77 72  AST 15 - 41 U/L _0 ALT 0 - 44 U/L _1 RADIOGRAPHIC STUDIES: I have personally reviewed the radiological images as listed and agreed with the findings in the report. No results found.   ASSESSMENT & PLAN:  Chrishawn Kring is a 54 y.o. female with    1.Malignant neoplasm of upper-outer quadrant of right breast,Stage1A,p(T1cN0M0),stage IA,ER+/PR-/HER2+, Grade3. -She was diagnosed in 05/2019. She is s/p right breast lumpectomy.  -Given her high risk HER2 positive breast cancer, I started her on chemotherapy withweekly Abraxaneand Anti-HER2 Herceptinfor 12 weeks beginning 07/29/19. Afterward she will proceed withmaintenance Herceptin q3weeks to complete 1 year of treatment.Goal of therapy is curative.  -Due to headaches her Herceptin (Ogivr) was switched to Herceptin Calla Kicks) on 09/08/19. Due to her insurance denial of Abraxane, I changed it to paclitaxel starting 09/15/19. -She is tolerating chemotherapy moderately well, developed severe back pain, which is better controlled with gabapentin and tramadol. -Labs reviewed, adequate for treatment, will proceed week 11 paclitaxel and trastuzumab -She will return her last dose chemo -She was referred to radiation oncologist Dr. Rhunette Croft, she plans to start adjuvant radiation after she returns from his trip in mid October. -She will continue trastuzumab every 3 weeks after next week  -she would like to get COVID boost shot in 4 weeks  -she wants to use hair products for her hair loss, ok to use Rogaine   2. 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 -She will f/u with Dr. Havery Moros for screening colonoscopies, last done 07/2018 with12polyps. I recommend to repeat in next 2 years.  -She was previously on chemoprevention withTamoxifen 10/2018-06/17/19   3. She has been counseled on Smoking Cessation. Currently smoking 1/2 ppd.   4. S/p ileocecectomy on 08/14/15 due to benign colon polyp invading appendix, history of recurrent colon polyps  5.H/o right breast fibroadenoma,S/p rightsurgical resection on 11/22/15  6. Hypercalcemia, likelyprimary hyperparathyroidism -She is not on Calcium or Vit D. I previously recommend she take a multivitamin which contains low-dose calcium -Her PTH was within normal limits, but on the high end. PTHrp was negative. This is likelyprimary hyperparathyroidism.  -Will refer her toendocrinology after she completes adjuvant chemotherapy   7. Leg pain, R>L -probably related to chemo -much better this week, continue gabapentin, tramadol as needed  PLAN: -Lab reviewed, adequate for treatment today with same dose -endocrine referral for hypercalcemia -f/u in one week for last dose chemo   No problem-specific Assessment & Plan notes found for this encounter.   Orders Placed This Encounter  Procedures  . Ambulatory referral to Endocrinology    Referral Priority:   Routine    Referral Type:   Consultation    Referral Reason:   Specialty Services Required    Number of Visits Requested:   1   All questions were answered. The patient knows to call the clinic with any problems, questions or concerns. No barriers to learning was detected.      Truitt Merle, MD 10/06/2019   I, Joslyn Devon, am acting as scribe for Truitt Merle, MD.   I have reviewed the above documentation for accuracy and completeness, and I agree with the above.

## 2019-10-06 ENCOUNTER — Other Ambulatory Visit: Payer: Self-pay

## 2019-10-06 ENCOUNTER — Telehealth: Payer: Self-pay | Admitting: Hematology

## 2019-10-06 ENCOUNTER — Inpatient Hospital Stay: Payer: BC Managed Care – PPO | Attending: Hematology

## 2019-10-06 ENCOUNTER — Inpatient Hospital Stay: Payer: BC Managed Care – PPO

## 2019-10-06 ENCOUNTER — Encounter: Payer: Self-pay | Admitting: Hematology

## 2019-10-06 ENCOUNTER — Inpatient Hospital Stay (HOSPITAL_BASED_OUTPATIENT_CLINIC_OR_DEPARTMENT_OTHER): Payer: BC Managed Care – PPO | Admitting: Hematology

## 2019-10-06 VITALS — BP 117/81 | HR 78 | Temp 98.1°F | Resp 18 | Ht 68.0 in | Wt 187.0 lb

## 2019-10-06 DIAGNOSIS — R21 Rash and other nonspecific skin eruption: Secondary | ICD-10-CM | POA: Insufficient documentation

## 2019-10-06 DIAGNOSIS — Z5112 Encounter for antineoplastic immunotherapy: Secondary | ICD-10-CM | POA: Diagnosis not present

## 2019-10-06 DIAGNOSIS — C50411 Malignant neoplasm of upper-outer quadrant of right female breast: Secondary | ICD-10-CM

## 2019-10-06 DIAGNOSIS — Z1589 Genetic susceptibility to other disease: Secondary | ICD-10-CM

## 2019-10-06 DIAGNOSIS — Z170 Estrogen receptor positive status [ER+]: Secondary | ICD-10-CM | POA: Diagnosis not present

## 2019-10-06 DIAGNOSIS — Z23 Encounter for immunization: Secondary | ICD-10-CM | POA: Insufficient documentation

## 2019-10-06 DIAGNOSIS — Z1501 Genetic susceptibility to malignant neoplasm of breast: Secondary | ICD-10-CM

## 2019-10-06 DIAGNOSIS — Z95828 Presence of other vascular implants and grafts: Secondary | ICD-10-CM

## 2019-10-06 DIAGNOSIS — Z5111 Encounter for antineoplastic chemotherapy: Secondary | ICD-10-CM | POA: Insufficient documentation

## 2019-10-06 LAB — CBC WITH DIFFERENTIAL (CANCER CENTER ONLY)
Abs Immature Granulocytes: 0.07 10*3/uL (ref 0.00–0.07)
Basophils Absolute: 0.1 10*3/uL (ref 0.0–0.1)
Basophils Relative: 1 %
Eosinophils Absolute: 0.2 10*3/uL (ref 0.0–0.5)
Eosinophils Relative: 3 %
HCT: 38.2 % (ref 36.0–46.0)
Hemoglobin: 13.3 g/dL (ref 12.0–15.0)
Immature Granulocytes: 1 %
Lymphocytes Relative: 39 %
Lymphs Abs: 2.5 10*3/uL (ref 0.7–4.0)
MCH: 33.2 pg (ref 26.0–34.0)
MCHC: 34.8 g/dL (ref 30.0–36.0)
MCV: 95.3 fL (ref 80.0–100.0)
Monocytes Absolute: 0.4 10*3/uL (ref 0.1–1.0)
Monocytes Relative: 6 %
Neutro Abs: 3.3 10*3/uL (ref 1.7–7.7)
Neutrophils Relative %: 50 %
Platelet Count: 286 10*3/uL (ref 150–400)
RBC: 4.01 MIL/uL (ref 3.87–5.11)
RDW: 15.3 % (ref 11.5–15.5)
WBC Count: 6.5 10*3/uL (ref 4.0–10.5)
nRBC: 0 % (ref 0.0–0.2)

## 2019-10-06 LAB — CMP (CANCER CENTER ONLY)
ALT: 21 U/L (ref 0–44)
AST: 17 U/L (ref 15–41)
Albumin: 3.7 g/dL (ref 3.5–5.0)
Alkaline Phosphatase: 80 U/L (ref 38–126)
Anion gap: 8 (ref 5–15)
BUN: 13 mg/dL (ref 6–20)
CO2: 25 mmol/L (ref 22–32)
Calcium: 10.6 mg/dL — ABNORMAL HIGH (ref 8.9–10.3)
Chloride: 107 mmol/L (ref 98–111)
Creatinine: 0.79 mg/dL (ref 0.44–1.00)
GFR, Est AFR Am: >60 mL/min (ref >60)
GFR, Estimated: >60 mL/min (ref >60)
Glucose, Bld: 129 mg/dL — ABNORMAL HIGH (ref 70–99)
Potassium: 4.1 mmol/L (ref 3.5–5.1)
Sodium: 140 mmol/L (ref 135–145)
Total Bilirubin: 0.4 mg/dL (ref 0.3–1.2)
Total Protein: 6.7 g/dL (ref 6.5–8.1)

## 2019-10-06 MED ORDER — SODIUM CHLORIDE 0.9 % IV SOLN
Freq: Once | INTRAVENOUS | Status: AC
  Filled 2019-10-06: qty 250

## 2019-10-06 MED ORDER — SODIUM CHLORIDE 0.9% FLUSH
10.0000 mL | Freq: Once | INTRAVENOUS | Status: AC
  Administered 2019-10-06: 10 mL
  Filled 2019-10-06: qty 10

## 2019-10-06 MED ORDER — FAMOTIDINE IN NACL 20-0.9 MG/50ML-% IV SOLN
20.0000 mg | Freq: Once | INTRAVENOUS | Status: AC
  Administered 2019-10-06: 20 mg via INTRAVENOUS

## 2019-10-06 MED ORDER — SODIUM CHLORIDE 0.9% FLUSH
10.0000 mL | INTRAVENOUS | Status: DC | PRN
  Administered 2019-10-06: 10 mL
  Filled 2019-10-06: qty 10

## 2019-10-06 MED ORDER — LORATADINE 10 MG PO TABS
10.0000 mg | ORAL_TABLET | Freq: Every day | ORAL | Status: DC
  Administered 2019-10-06: 10 mg via ORAL

## 2019-10-06 MED ORDER — SODIUM CHLORIDE 0.9 % IV SOLN
80.0000 mg/m2 | Freq: Once | INTRAVENOUS | Status: AC
  Administered 2019-10-06: 162 mg via INTRAVENOUS
  Filled 2019-10-06: qty 27

## 2019-10-06 MED ORDER — TRASTUZUMAB-ANNS CHEMO 150 MG IV SOLR
2.0000 mg/kg | Freq: Once | INTRAVENOUS | Status: AC
  Administered 2019-10-06: 168 mg via INTRAVENOUS
  Filled 2019-10-06: qty 8

## 2019-10-06 MED ORDER — LORATADINE 10 MG PO TABS
ORAL_TABLET | ORAL | Status: AC
  Filled 2019-10-06: qty 1

## 2019-10-06 MED ORDER — SODIUM CHLORIDE 0.9 % IV SOLN
20.0000 mg | Freq: Once | INTRAVENOUS | Status: AC
  Administered 2019-10-06: 20 mg via INTRAVENOUS
  Filled 2019-10-06: qty 20

## 2019-10-06 MED ORDER — HEPARIN SOD (PORK) LOCK FLUSH 100 UNIT/ML IV SOLN
500.0000 [IU] | Freq: Once | INTRAVENOUS | Status: AC | PRN
  Administered 2019-10-06: 500 [IU]
  Filled 2019-10-06: qty 5

## 2019-10-06 MED ORDER — FAMOTIDINE IN NACL 20-0.9 MG/50ML-% IV SOLN
INTRAVENOUS | Status: AC
  Filled 2019-10-06: qty 50

## 2019-10-06 MED ORDER — PROCHLORPERAZINE MALEATE 10 MG PO TABS
10.0000 mg | ORAL_TABLET | Freq: Once | ORAL | Status: AC
  Administered 2019-10-06: 10 mg via ORAL

## 2019-10-06 MED ORDER — PROCHLORPERAZINE MALEATE 10 MG PO TABS
ORAL_TABLET | ORAL | Status: AC
  Filled 2019-10-06: qty 1

## 2019-10-06 NOTE — Telephone Encounter (Signed)
Release: [id number] Faxed medical records to [facility] @ [phone]

## 2019-10-06 NOTE — Patient Instructions (Signed)
[facility] Discharge Instructions for Patients Receiving Chemotherapy  Today you received the following chemotherapy agents: trastuzumab, taxol  To help prevent nausea and vomiting after your treatment, we encourage you to take your nausea medication as directed.    If you develop nausea and vomiting that is not controlled by your nausea medication, call the clinic.   BELOW ARE SYMPTOMS THAT SHOULD BE REPORTED IMMEDIATELY:  *FEVER GREATER THAN 100.5 F  *CHILLS WITH OR WITHOUT FEVER  NAUSEA AND VOMITING THAT IS NOT CONTROLLED WITH YOUR NAUSEA MEDICATION  *UNUSUAL SHORTNESS OF BREATH  *UNUSUAL BRUISING OR BLEEDING  TENDERNESS IN MOUTH AND THROAT WITH OR WITHOUT PRESENCE OF ULCERS  *URINARY PROBLEMS  *BOWEL PROBLEMS  UNUSUAL RASH Items with * indicate a potential emergency and should be followed up as soon as possible.  Feel free to call the clinic should you have any questions or concerns. The clinic phone number is [phone].  Please show the [facility] at check-in to the Emergency Department and triage nurse.

## 2019-10-07 ENCOUNTER — Telehealth: Payer: Self-pay | Admitting: Hematology

## 2019-10-07 NOTE — Telephone Encounter (Signed)
Scheduled appts per 9/2 los. Was not able to leave voicemail. Pt to get updated appt calendar at next visit per appt notes.

## 2019-10-11 NOTE — Progress Notes (Signed)
[place]   [phone] [phone]   Clinic Follow up Note   Patient Care Team: Patient, [name] as PCP - General ([place]) Minus Breeding, MD as PCP - Cardiology (Cardiology) Armbruster, Carlota Raspberry, MD as Consulting Physician (Gastroenterology) Eyvonne Mechanic as Counselor (Licensed Clinical Social Worker) Truitt Merle, MD as Consulting Physician (Hematology) Alla Feeling, NP as Nurse Practitioner (Nurse Practitioner) Mauro Kaufmann, RN as Oncology Nurse Navigator Rockwell Germany, RN as Oncology Nurse Navigator Rolm Bookbinder, MD as Consulting Physician (General Surgery)  Date of Service:  10/12/2019  CHIEF COMPLAINT: F/u ofRight breast cancer  SUMMARY OF ONCOLOGIC HISTORY: Oncology History Overview Note  Cancer Staging Malignant neoplasm of upper-outer quadrant of right breast in female, estrogen receptor positive ([place]) Staging form: Breast, AJCC 8th Edition - Clinical stage from 06/01/2019: Stage IA (cT1c, cN0, cM0, G3, ER+, PR-, HER2+) - Signed by Truitt Merle, MD on 06/17/2019 - Pathologic stage from 06/30/2019: Stage IA (pT1c, pN0, cM0, G3, ER+, PR-, HER2+) - Signed by Truitt Merle, MD on 07/15/2019    Malignant neoplasm of upper-outer quadrant of right breast in female, estrogen receptor positive ([place])  10/2018 -  Anti-estrogen oral therapy   She was preventively started on Tamoxifen since 10/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 Held after 06/17/19 to proceed with her breast cancer surgery and chemo.    05/06/2019 Breast MRI   IMPRESSION Enhancing right breast mass suspicious for malignancy. Second look [place] if recommended.  No Suspicious enhancing left breast masses.    05/24/2019 Breast US   IMPRESSION 1.8cm irregular right breast mass within the 9:30 position 3.5cm from the nipple is highly suggestive of malignancy. An Ultrasound guided breast biopsy is recommended. The findings of the study and recommendation for biopsy were discussed  with the patient directly.    06/01/2019 Cancer Staging   Staging form: Breast, AJCC 8th Edition - Clinical stage from 06/01/2019: Stage IA (cT1c, cN0, cM0, G3, ER+, PR-, HER2+) - Signed by Truitt Merle, MD on 06/17/2019   06/01/2019 Initial Biopsy   Breast Biopsy - Right Breast Core Bx DIAGNOSIS INFILTRATING DUCT CARCINOMA WITH MINOR DUCTAL CARCINOMA IN SITU COMPNENT OF RIGHT BREAST, CORE NEEDLE BIOPSY    06/01/2019 Receptors her2   ER: greater than 75% of tumor cells show moderate staining  PR: Negative  HER2: Positive 3+ Ki67: 40%   06/17/2019 Initial Diagnosis   Malignant neoplasm of upper-outer quadrant of right breast in female, estrogen receptor positive ([place])   06/30/2019 Surgery   RIGHT BREAST LUMPECTOMY WITH RADIOACTIVE SEED AND RIGHT AXILLARY SENTINEL NODE BIOPSY and PAC placement  by Dr Donne Hazel     06/30/2019 Pathology Results   FINAL MICROSCOPIC DIAGNOSIS:   A. BREAST, RIGHT, LUMPECTOMY:  - Invasive ductal carcinoma, grade 3, spanning 1.5 cm.  - Intermediate grade ductal carcinoma in situ.  - Invasive carcinoma is 0.1-0.2 from the final lateral margin (part E).  - Margins are negative for in situ carcinoma.  - Biopsy site.  - See oncology table.   B. LYMPH NODE, RIGHT AXILLARY, SENTINEL, BIOPSY:  - One of one lymph nodes negative for carcinoma (0/1).   C. LYMPH NODE, RIGHT AXILLARY, SENTINEL, BIOPSY:  - One of one lymph nodes negative for carcinoma (0/1).   D. LYMPH NODE, RIGHT AXILLARY, SENTINEL, BIOPSY:  - One of one lymph nodes negative for carcinoma (0/1).   E. BREAST, RIGHT ADDITIONAL LATERAL MARGIN, EXCISION:  - Invasive ductal carcinoma.  - Invasive carcinoma is  0.1-0.2 cm from the new lateral margin.   F. BREAST, RIGHT ADDITIONAL POSTERIOR MARGIN, EXCISION:  - Fibrocystic change and usual ductal hyperplasia.  - No malignancy identified.   G. BREAST, RIGHT ADDITIONAL SUPERIOR MARGIN, EXCISION:  - Fibrocystic change and usual ductal hyperplasia.  -  Incidental radial scar.  - No malignancy identified.     PROGNOSTIC INDICATOR RESULTS:  The tumor cells are POSITIVE for Her2 (3+).  Estrogen Receptor:       POSITIVE, 85%, MODERATE STAINING  Progesterone Receptor:   NEGATIVE  Proliferation Marker Ki-67:   25%    06/30/2019 Cancer Staging   Staging form: Breast, AJCC 8th Edition - Pathologic stage from 06/30/2019: Stage IA (pT1c, pN0, cM0, G3, ER+, PR-, HER2+) - Signed by Truitt Merle, MD on 07/15/2019   07/29/2019 -  Chemotherapy   Adjuvant Weekly Abraxane and Transtuzumab for 12 weeks starting 07/29/19-10/12/19, followed by maintenance Herceptin q3weeks starting 11/03/19 to complete 1 year of treatment. Due to headaches her Herceptin (Ogivr) was switched to Herceptin Calla Kicks) on 09/08/19. Due to her insurance denial of Abraxane, I changed it to paclitaxel starting 09/15/19.      CURRENT THERAPY:  Adjuvant Weekly Abraxane andTranstuzumabfor 12 weeks starting 07/29/19-10/12/19, followed by maintenance Herceptin q3weeks starting 11/03/19 to complete 1 year of treatment.Due to headaches her Herceptin (Ogivr) was switched to Herceptin Calla Kicks) on 09/08/19.Due to her insurance denial of Abraxane,Ichangedit to paclitaxel starting 09/15/19.   INTERVAL HISTORY:  Abigail Berg is here for a follow up and treatment. She presents to the clinic with her sister. She notes she is on Gabapentin $RemoveBefor'100mg'pnhhxVnvBQmE$  BID which helps her leg pain. She has not needed much tramadol now. She plans to consult with [facility] on 12/16/19. She notes her abdominal hernia has caused her pain intermittently recently.     REVIEW OF SYSTEMS:   Constitutional: Denies fevers, chills or abnormal weight loss Eyes: Denies blurriness of vision Ears, nose, mouth, throat, and face: Denies mucositis or sore throat Respiratory: Denies cough, dyspnea or wheezes Cardiovascular: Denies palpitation, chest discomfort or lower extremity swelling Gastrointestinal:  Denies nausea, heartburn or change  in bowel habits (+) abdominal hernia pain Skin: Denies abnormal skin rashes Lymphatics: Denies new lymphadenopathy or easy bruising Neurological: (+) Improved leg pain  Behavioral/Psych: Mood is stable, no new changes  All other systems were reviewed with the patient and are negative.  MEDICAL HISTORY:  Past Medical History:  Diagnosis Date   Depression    Family history of colon cancer    Monoallelic mutation of CHEK2 gene in female patient    Personal history of colonic polyps    S/P partial colectomy     SURGICAL HISTORY: Past Surgical History:  Procedure Laterality Date   BREAST LUMPECTOMY WITH RADIOACTIVE SEED AND SENTINEL LYMPH NODE BIOPSY Right 06/30/2019   Procedure: RIGHT BREAST LUMPECTOMY WITH RADIOACTIVE SEED AND RIGHT AXILLARY SENTINEL NODE BIOPSY;  Surgeon: Rolm Bookbinder, MD;  Location: [facility];  Service: General;  Laterality: Right;  PEC BLOCK   BREAST SURGERY     COLON SURGERY     Partial colectomy   COLONOSCOPY     POLYPECTOMY     PORTACATH PLACEMENT Left 06/30/2019   Procedure: INSERTION PORT-A-CATH WITH ULTRASOUND GUIDANCE;  Surgeon: Rolm Bookbinder, MD;  Location: [facility];  Service: General;  Laterality: Left;    I have reviewed the social history and family history with the patient and they are unchanged from previous note.  ALLERGIES:  has No Known  Allergies.  MEDICATIONS:  Current Outpatient Medications  Medication Sig Dispense Refill   acetaminophen (TYLENOL) 325 MG tablet Take 650 mg by mouth every 6 (six) hours as needed for headache.     escitalopram (LEXAPRO) 20 MG tablet Take 20 mg by mouth daily.      gabapentin (NEURONTIN) 300 MG capsule Take 1 capsule (300 mg total) by mouth 3 (three) times daily. 30 capsule 0   ibuprofen (ADVIL) 200 MG tablet Take 200 mg by mouth every 6 (six) hours as needed for headache or mild pain.     lidocaine-prilocaine (EMLA) cream Apply to affected area once 30  g 3   LORazepam (ATIVAN) 0.5 MG tablet Take 1 tablet (0.5 mg total) by mouth every 8 (eight) hours as needed for anxiety. 20 tablet 0   Multiple Vitamins-Minerals (CENTRUM WOMEN PO) Take 1 tablet by mouth daily.     ondansetron (ZOFRAN) 8 MG tablet Take 1 tablet (8 mg total) by mouth 2 (two) times daily as needed (Nausea or vomiting). 30 tablet 1   prochlorperazine (COMPAZINE) 10 MG tablet Take 1 tablet (10 mg total) by mouth every 6 (six) hours as needed (Nausea or vomiting). 30 tablet 1   traMADol (ULTRAM) 50 MG tablet Take 1 tablet (50 mg total) by mouth every 6 (six) hours as needed. 15 tablet 0   No current facility-administered medications for this visit.   Facility-Administered Medications Ordered in Other Visits  Medication Dose Route Frequency Provider Last Rate Last Admin   heparin lock flush 100 unit/mL  500 Units Intracatheter Once PRN Truitt Merle, MD       loratadine (CLARITIN) tablet 10 mg  10 mg Oral Daily Truitt Merle, MD   10 mg at 10/12/19 1355   PACLitaxel (TAXOL) 162 mg in sodium chloride 0.9 % 250 mL chemo infusion (</= $RemoveBefor'80mg'piyZTNoilOYy$ /m2)  80 mg/m2 (Treatment Plan Recorded) Intravenous Once Alla Feeling, NP 277 mL/hr at 10/12/19 1517 162 mg at 10/12/19 1517   sodium chloride flush (NS) 0.9 % injection 10 mL  10 mL Intracatheter PRN Truitt Merle, MD        PHYSICAL EXAMINATION: ECOG PERFORMANCE STATUS: 1 - Symptomatic but completely ambulatory  Vitals:   10/12/19 1250  BP: 123/68  Pulse: 67  Resp: 18  SpO2: 100%   Filed Weights   10/12/19 1250  Weight: 187 lb (84.8 kg)    GENERAL:alert, no distress and comfortable SKIN: skin color, texture, turgor are normal, no rashes or significant lesions EYES: normal, Conjunctiva are pink and non-injected, sclera clear  NECK: supple, thyroid normal size, non-tender, without nodularity LYMPH:  no palpable lymphadenopathy in the cervical, axillary  LUNGS: clear to auscultation and percussion with normal breathing effort HEART:  regular rate & rhythm and no murmurs and no lower extremity edema ABDOMEN:abdomen soft, non-tender and normal bowel sounds (+) surgical incision healed well (+) Right abdominal hernia  Musculoskeletal:no cyanosis of digits and no clubbing  NEURO: alert & oriented x 3 with fluent speech, no focal motor/sensory deficits  LABORATORY DATA:  I have reviewed the data as listed CBC Latest Ref Rng & Units 10/12/2019 10/06/2019 09/29/2019  WBC 4.0 - 10.5 K/uL 6.0 6.5 5.6  Hemoglobin 12.0 - 15.0 g/dL 13.2 13.3 13.2  Hematocrit 36 - 46 % 38.1 38.2 38.0  Platelets 150 - 400 K/uL 287 286 220     CMP Latest Ref Rng & Units 10/12/2019 10/06/2019 09/29/2019  Glucose 70 - 99 mg/dL 98 129(H) 112(H)  BUN 6 - 20  mg/dL '13 13 15  '$ Creatinine 0.44 - 1.00 mg/dL 0.76 0.79 0.80  Sodium 135 - 145 mmol/L 139 140 139  Potassium 3.5 - 5.1 mmol/L 4.1 4.1 4.2  Chloride 98 - 111 mmol/L 108 107 109  CO2 22 - 32 mmol/L $RemoveB'26 25 25  'eUbyAqTf$ Calcium 8.9 - 10.3 mg/dL 10.9(H) 10.6(H) 11.0(H)  Total Protein 6.5 - 8.1 g/dL 6.7 6.7 6.5  Total Bilirubin 0.3 - 1.2 mg/dL 0.3 0.4 0.3  Alkaline Phos 38 - 126 U/L 73 80 77  AST 15 - 41 U/L $Remo'16 17 17  'GeBhE$ ALT 0 - 44 U/L $Remo'18 21 21      'NIspz$ RADIOGRAPHIC STUDIES: I have personally reviewed the radiological images as listed and agreed with the findings in the report. No results found.   ASSESSMENT & PLAN:  Abigail Berg is a 54 y.o. female with    1.Malignant neoplasm of upper-outer quadrant of right breast,Stage1A,p(T1cN0M0),stage IA,ER+/PR-/HER2+, Grade3. -She was diagnosed in 05/2019. She is s/p right breast lumpectomy.  -Given her high risk HER2 positive breast cancer, I started her on chemotherapy withweekly Abraxane and Anti-HER2 Herceptinfor 12 weeks beginning 07/29/19. Afterward she will proceed withmaintenance Herceptin q3weeks to complete 1 year of treatment.Goal of therapy is curative. -Due to headaches her Herceptin (Ogivr) was switched to Herceptin Calla Kicks) on 09/08/19.Due to her  insurance denial of Abraxane,Ichangedit to paclitaxel starting 09/15/19. -S/p week 11 she continues to tolerate well. Labs reviewed and adequate to proceed with week 12 Kanjinti and Taxol today.  -She will continue Kanjinti every 3 weeks. I will see if her insurance will approve Herceptin injections.  -She does have abdominal hernia which has become painful recently. She does not feel she can wait until end of IV treatment for hernia repair. I will f/u with Dr Donne Hazel -F/u in 12 weeks, plan to start adjuvant antiestrogen therapy on next visit     2. 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 -She will f/u with Dr. Havery Moros for screening colonoscopies, last done 07/2018 with12polyps. I recommend to repeat in next 2 years.  -She was previously on chemoprevention withTamoxifen 10/2018-06/17/19   3. She has been counseled on Smoking Cessation. Currently smoking 1/2 ppd.   4. S/p ileocecectomy on 08/14/15 due to benign colon polyp invading appendix, history of recurrent colon polyps  5.H/o right breast fibroadenoma,S/p rightsurgical resection on 11/22/15  6. Hypercalcemia, likelyprimary hyperparathyroidism -She is not on Calcium or Vit D. I previously recommend she take a multivitamin which contains low-dose calcium -Her PTH was within normal limits, but on the high end. PTHrp was negative. This is likelyprimary hyperparathyroidism.  -She will not be seen by endocrinology until 12/16/19. I will see if she can be seen sooner.   7. Leg pain, R>L -probably related to chemo -much better this week, continue gabapentin, tramadol as needed  PLAN: -Labs reviewed and adequate to proceed with last week Kanjinti and Taxol today  -Kanjinti or Hercpetin injection (if approved by insurance) every 3 weeks.  -she will proceed  radiation at local  -lab and f/u in 12 weeks  -will copy Dr. Donne Hazel about her hernia surgery  -will contact endocrine if they can see her sooner than 12/16/19 for her hypercalcemia    No problem-specific Assessment & Plan notes found for this encounter.   No orders of the defined types were placed in this encounter.  All  questions were answered. The patient knows to call the clinic with any problems, questions or concerns. No barriers to learning was detected. The total time spent in the appointment was 30 minutes.     Truitt Merle, MD 10/12/2019   I, Joslyn Devon, am acting as scribe for Truitt Merle, MD.   I have reviewed the above documentation for accuracy and completeness, and I agree with the above.

## 2019-10-12 ENCOUNTER — Telehealth: Payer: Self-pay | Admitting: Hematology

## 2019-10-12 ENCOUNTER — Inpatient Hospital Stay: Payer: BC Managed Care – PPO

## 2019-10-12 ENCOUNTER — Inpatient Hospital Stay (HOSPITAL_BASED_OUTPATIENT_CLINIC_OR_DEPARTMENT_OTHER): Payer: BC Managed Care – PPO | Admitting: Hematology

## 2019-10-12 ENCOUNTER — Encounter: Payer: Self-pay | Admitting: Hematology

## 2019-10-12 ENCOUNTER — Other Ambulatory Visit: Payer: Self-pay

## 2019-10-12 ENCOUNTER — Encounter: Payer: Self-pay | Admitting: *Deleted

## 2019-10-12 ENCOUNTER — Telehealth: Payer: Self-pay | Admitting: *Deleted

## 2019-10-12 VITALS — BP 123/68 | HR 67 | Resp 18 | Ht 68.0 in | Wt 187.0 lb

## 2019-10-12 DIAGNOSIS — C50411 Malignant neoplasm of upper-outer quadrant of right female breast: Secondary | ICD-10-CM

## 2019-10-12 DIAGNOSIS — Z170 Estrogen receptor positive status [ER+]: Secondary | ICD-10-CM

## 2019-10-12 DIAGNOSIS — Z5111 Encounter for antineoplastic chemotherapy: Secondary | ICD-10-CM | POA: Diagnosis not present

## 2019-10-12 DIAGNOSIS — R21 Rash and other nonspecific skin eruption: Secondary | ICD-10-CM | POA: Diagnosis not present

## 2019-10-12 DIAGNOSIS — Z1589 Genetic susceptibility to other disease: Secondary | ICD-10-CM

## 2019-10-12 DIAGNOSIS — Z5112 Encounter for antineoplastic immunotherapy: Secondary | ICD-10-CM | POA: Diagnosis not present

## 2019-10-12 DIAGNOSIS — Z95828 Presence of other vascular implants and grafts: Secondary | ICD-10-CM

## 2019-10-12 DIAGNOSIS — Z23 Encounter for immunization: Secondary | ICD-10-CM | POA: Diagnosis not present

## 2019-10-12 DIAGNOSIS — Z1501 Genetic susceptibility to malignant neoplasm of breast: Secondary | ICD-10-CM

## 2019-10-12 LAB — CMP (CANCER CENTER ONLY)
ALT: 18 U/L (ref 0–44)
AST: 16 U/L (ref 15–41)
Albumin: 3.6 g/dL (ref 3.5–5.0)
Alkaline Phosphatase: 73 U/L (ref 38–126)
Anion gap: 5 (ref 5–15)
BUN: 13 mg/dL (ref 6–20)
CO2: 26 mmol/L (ref 22–32)
Calcium: 10.9 mg/dL — ABNORMAL HIGH (ref 8.9–10.3)
Chloride: 108 mmol/L (ref 98–111)
Creatinine: 0.76 mg/dL (ref 0.44–1.00)
GFR, Est AFR Am: >60 mL/min (ref >60)
GFR, Estimated: >60 mL/min (ref >60)
Glucose, Bld: 98 mg/dL (ref 70–99)
Potassium: 4.1 mmol/L (ref 3.5–5.1)
Sodium: 139 mmol/L (ref 135–145)
Total Bilirubin: 0.3 mg/dL (ref 0.3–1.2)
Total Protein: 6.7 g/dL (ref 6.5–8.1)

## 2019-10-12 LAB — CBC WITH DIFFERENTIAL (CANCER CENTER ONLY)
Abs Immature Granulocytes: 0.05 10*3/uL (ref 0.00–0.07)
Basophils Absolute: 0.1 10*3/uL (ref 0.0–0.1)
Basophils Relative: 1 %
Eosinophils Absolute: 0.2 10*3/uL (ref 0.0–0.5)
Eosinophils Relative: 3 %
HCT: 38.1 % (ref 36.0–46.0)
Hemoglobin: 13.2 g/dL (ref 12.0–15.0)
Immature Granulocytes: 1 %
Lymphocytes Relative: 42 %
Lymphs Abs: 2.5 10*3/uL (ref 0.7–4.0)
MCH: 33 pg (ref 26.0–34.0)
MCHC: 34.6 g/dL (ref 30.0–36.0)
MCV: 95.3 fL (ref 80.0–100.0)
Monocytes Absolute: 0.3 10*3/uL (ref 0.1–1.0)
Monocytes Relative: 5 %
Neutro Abs: 3 10*3/uL (ref 1.7–7.7)
Neutrophils Relative %: 48 %
Platelet Count: 287 10*3/uL (ref 150–400)
RBC: 4 MIL/uL (ref 3.87–5.11)
RDW: 15.7 % — ABNORMAL HIGH (ref 11.5–15.5)
WBC Count: 6 10*3/uL (ref 4.0–10.5)
nRBC: 0 % (ref 0.0–0.2)

## 2019-10-12 MED ORDER — PROCHLORPERAZINE MALEATE 10 MG PO TABS
10.0000 mg | ORAL_TABLET | Freq: Once | ORAL | Status: AC
  Administered 2019-10-12: 10 mg via ORAL

## 2019-10-12 MED ORDER — SODIUM CHLORIDE 0.9 % IV SOLN
80.0000 mg/m2 | Freq: Once | INTRAVENOUS | Status: AC
  Administered 2019-10-12: 162 mg via INTRAVENOUS
  Filled 2019-10-12: qty 27

## 2019-10-12 MED ORDER — SODIUM CHLORIDE 0.9 % IV SOLN
Freq: Once | INTRAVENOUS | Status: AC
  Filled 2019-10-12: qty 250

## 2019-10-12 MED ORDER — PROCHLORPERAZINE MALEATE 10 MG PO TABS
ORAL_TABLET | ORAL | Status: AC
  Filled 2019-10-12: qty 1

## 2019-10-12 MED ORDER — SODIUM CHLORIDE 0.9% FLUSH
10.0000 mL | INTRAVENOUS | Status: DC | PRN
  Administered 2019-10-12: 10 mL
  Filled 2019-10-12: qty 10

## 2019-10-12 MED ORDER — LORATADINE 10 MG PO TABS
10.0000 mg | ORAL_TABLET | Freq: Every day | ORAL | Status: DC
  Administered 2019-10-12: 10 mg via ORAL

## 2019-10-12 MED ORDER — FAMOTIDINE IN NACL 20-0.9 MG/50ML-% IV SOLN
20.0000 mg | Freq: Once | INTRAVENOUS | Status: AC
  Administered 2019-10-12: 20 mg via INTRAVENOUS

## 2019-10-12 MED ORDER — LORATADINE 10 MG PO TABS
ORAL_TABLET | ORAL | Status: AC
  Filled 2019-10-12: qty 1

## 2019-10-12 MED ORDER — TRASTUZUMAB-ANNS CHEMO 150 MG IV SOLR
6.0000 mg/kg | Freq: Once | INTRAVENOUS | Status: AC
  Administered 2019-10-12: 504 mg via INTRAVENOUS
  Filled 2019-10-12: qty 24

## 2019-10-12 MED ORDER — FAMOTIDINE IN NACL 20-0.9 MG/50ML-% IV SOLN
INTRAVENOUS | Status: AC
  Filled 2019-10-12: qty 50

## 2019-10-12 MED ORDER — SODIUM CHLORIDE 0.9% FLUSH
10.0000 mL | Freq: Once | INTRAVENOUS | Status: AC
  Administered 2019-10-12: 10 mL
  Filled 2019-10-12: qty 10

## 2019-10-12 MED ORDER — HEPARIN SOD (PORK) LOCK FLUSH 100 UNIT/ML IV SOLN
500.0000 [IU] | Freq: Once | INTRAVENOUS | Status: AC | PRN
  Administered 2019-10-12: 500 [IU]
  Filled 2019-10-12: qty 5

## 2019-10-12 MED ORDER — SODIUM CHLORIDE 0.9 % IV SOLN
20.0000 mg | Freq: Once | INTRAVENOUS | Status: AC
  Administered 2019-10-12: 20 mg via INTRAVENOUS
  Filled 2019-10-12: qty 20

## 2019-10-12 NOTE — Telephone Encounter (Signed)
Left vm for [facility] radiation oncology in regards to appt to be seen for radiation oncology consult. Contact information provided for return call

## 2019-10-12 NOTE — Telephone Encounter (Signed)
Scheduled per 9/8 los. Messaged RN Tim to give pt appt calendar during tx.

## 2019-10-12 NOTE — Telephone Encounter (Signed)
Scheduled per 9/8 los. Messaged RN Tim to give pt appt calendar during tx. Pt cannot come in earlier than 11:45.

## 2019-10-12 NOTE — Progress Notes (Signed)
Abigail Berg requested appt on 9/30 be moved to later in the day.  She is unable to be here before 1145.  Scheduling message sent.

## 2019-10-12 NOTE — Patient Instructions (Signed)
[facility] Discharge Instructions for Patients Receiving Chemotherapy  Today you received the following chemotherapy agents: trastuzumab and paclitaxel.  To help prevent nausea and vomiting after your treatment, we encourage you to take your nausea medication as directed.   If you develop nausea and vomiting that is not controlled by your nausea medication, call the clinic.   BELOW ARE SYMPTOMS THAT SHOULD BE REPORTED IMMEDIATELY:  *FEVER GREATER THAN 100.5 F  *CHILLS WITH OR WITHOUT FEVER  NAUSEA AND VOMITING THAT IS NOT CONTROLLED WITH YOUR NAUSEA MEDICATION  *UNUSUAL SHORTNESS OF BREATH  *UNUSUAL BRUISING OR BLEEDING  TENDERNESS IN MOUTH AND THROAT WITH OR WITHOUT PRESENCE OF ULCERS  *URINARY PROBLEMS  *BOWEL PROBLEMS  UNUSUAL RASH Items with * indicate a potential emergency and should be followed up as soon as possible.  Feel free to call the clinic should you have any questions or concerns. The clinic phone number is [phone].  Please show the CHEMO ALERT CARD at check-in to the Emergency Department and triage nurse.   

## 2019-10-17 ENCOUNTER — Other Ambulatory Visit: Payer: Self-pay | Admitting: Hematology

## 2019-10-18 ENCOUNTER — Encounter: Payer: Self-pay | Admitting: *Deleted

## 2019-10-19 ENCOUNTER — Other Ambulatory Visit: Payer: Self-pay | Admitting: Hematology

## 2019-10-19 NOTE — Telephone Encounter (Signed)
Forwarded to dr Kyra Leyland

## 2019-10-21 ENCOUNTER — Other Ambulatory Visit: Payer: Self-pay | Admitting: Hematology

## 2019-10-21 ENCOUNTER — Other Ambulatory Visit: Payer: Self-pay

## 2019-10-25 ENCOUNTER — Ambulatory Visit (INDEPENDENT_AMBULATORY_CARE_PROVIDER_SITE_OTHER): Payer: BC Managed Care – PPO | Admitting: Internal Medicine

## 2019-10-25 ENCOUNTER — Other Ambulatory Visit: Payer: Self-pay

## 2019-10-25 ENCOUNTER — Inpatient Hospital Stay (HOSPITAL_BASED_OUTPATIENT_CLINIC_OR_DEPARTMENT_OTHER): Payer: BC Managed Care – PPO | Admitting: Medical

## 2019-10-25 ENCOUNTER — Encounter: Payer: Self-pay | Admitting: Internal Medicine

## 2019-10-25 ENCOUNTER — Telehealth: Payer: Self-pay

## 2019-10-25 VITALS — BP 133/70 | HR 86 | Temp 97.8°F | Resp 18 | Ht 68.0 in | Wt 192.0 lb

## 2019-10-25 VITALS — BP 132/84 | HR 82 | Ht 68.0 in | Wt 189.8 lb

## 2019-10-25 DIAGNOSIS — E559 Vitamin D deficiency, unspecified: Secondary | ICD-10-CM | POA: Diagnosis not present

## 2019-10-25 DIAGNOSIS — R21 Rash and other nonspecific skin eruption: Secondary | ICD-10-CM | POA: Diagnosis not present

## 2019-10-25 DIAGNOSIS — C50411 Malignant neoplasm of upper-outer quadrant of right female breast: Secondary | ICD-10-CM

## 2019-10-25 DIAGNOSIS — Z95828 Presence of other vascular implants and grafts: Secondary | ICD-10-CM | POA: Diagnosis not present

## 2019-10-25 DIAGNOSIS — Z23 Encounter for immunization: Secondary | ICD-10-CM | POA: Diagnosis not present

## 2019-10-25 DIAGNOSIS — E213 Hyperparathyroidism, unspecified: Secondary | ICD-10-CM

## 2019-10-25 DIAGNOSIS — Z5112 Encounter for antineoplastic immunotherapy: Secondary | ICD-10-CM | POA: Diagnosis not present

## 2019-10-25 DIAGNOSIS — Z170 Estrogen receptor positive status [ER+]: Secondary | ICD-10-CM

## 2019-10-25 DIAGNOSIS — Z5111 Encounter for antineoplastic chemotherapy: Secondary | ICD-10-CM | POA: Diagnosis not present

## 2019-10-25 MED ORDER — HEPARIN SOD (PORK) LOCK FLUSH 100 UNIT/ML IV SOLN
500.0000 [IU] | Freq: Once | INTRAVENOUS | Status: AC
  Administered 2019-10-25: 500 [IU]
  Filled 2019-10-25: qty 5

## 2019-10-25 MED ORDER — SODIUM CHLORIDE 0.9 % IV SOLN
INTRAVENOUS | Status: DC
  Filled 2019-10-25: qty 250

## 2019-10-25 MED ORDER — PREDNISONE 5 MG PO TABS: ORAL_TABLET | ORAL | 0 refills | Status: DC

## 2019-10-25 MED ORDER — SODIUM CHLORIDE 0.9 % IV SOLN
10.0000 mg | Freq: Once | INTRAVENOUS | Status: AC
  Administered 2019-10-25: 10 mg via INTRAVENOUS
  Filled 2019-10-25: qty 10

## 2019-10-25 MED ORDER — SODIUM CHLORIDE 0.9% FLUSH
10.0000 mL | Freq: Once | INTRAVENOUS | Status: AC
  Administered 2019-10-25: 10 mL
  Filled 2019-10-25: qty 10

## 2019-10-25 NOTE — Progress Notes (Signed)
Name: Abigail Berg  MRN/ DOB: [id number], 11-01-1965    Age/ Sex: 54 y.o., female    PCP: Patient, [name]   Reason for Endocrinology Evaluation: Hypercalcemia      Date of Initial Endocrinology Evaluation: 10/25/2019     HPI: Ms. Abigail Berg is a 54 y.o. female with a past medical history of Right breast Stage I Ca ( s/p lumpectomy) .The patient presented for initial endocrinology clinic visit on 10/25/2019 for consultative assistance with her hypercalcemia.    She is accompanied by friend Abigail Basta   Ms. Abigail Berg that she was first diagnosed with hypercalcemia in 2020 on routine labs. Since that time, she has chronically experienced symptoms of  polyuria, polydipsia, she has generalized weakness that she attributes to chemotherapy, denies constipation, has leg pains - gabapentin helps. She admits to use of over the counter calcium,but no  lithium, HCTZ, or vitamin D supplements.   She has a history of kidney stones, kidney disease,but denies  liver disease, granulomatous disease. She denies osteoporosis or prior fractures. Daily dietary calcium intake: 1 servings every other day. She denies  family history of osteoporosis, parathyroid disease, thyroid disease.    She is s/P chemo  and pending radiation in a month       HISTORY:  Past Medical History:  Past Medical History:  Diagnosis Date  . Depression   . Family history of colon cancer   . Monoallelic mutation of CHEK2 gene in female patient   . Personal history of colonic polyps   . S/P partial colectomy    Past Surgical History:  Past Surgical History:  Procedure Laterality Date  . BREAST LUMPECTOMY WITH RADIOACTIVE SEED AND SENTINEL LYMPH NODE BIOPSY Right 06/30/2019   Procedure: RIGHT BREAST LUMPECTOMY WITH RADIOACTIVE SEED AND RIGHT AXILLARY SENTINEL NODE BIOPSY;  Surgeon: Rolm Bookbinder, MD;  Location: [facility];  Service: General;  Laterality: Right;  PEC BLOCK  . BREAST SURGERY    . COLON  SURGERY     Partial colectomy  . COLONOSCOPY    . POLYPECTOMY    . PORTACATH PLACEMENT Left 06/30/2019   Procedure: INSERTION PORT-A-CATH WITH ULTRASOUND GUIDANCE;  Surgeon: Rolm Bookbinder, MD;  Location: [facility];  Service: General;  Laterality: Left;      Social History:  reports that she has been smoking. She has a 12.50 pack-year smoking history. She has never used smokeless tobacco. She reports that she does not drink alcohol and does not use drugs.  Family History: family history includes Cancer in an other family member; Colon cancer (age of onset: 28) in her cousin; Colon cancer (age of onset: 1) in her maternal grandmother; Colon cancer (age of onset: 40) in her maternal grandfather.   HOME MEDICATIONS: Allergies as of 10/25/2019   No Known Allergies     Medication List       Accurate as of October 25, 2019 12:04 PM. If you have any questions, ask your nurse or doctor.        STOP taking these medications   LORazepam 0.5 MG tablet Commonly known as: Ativan Stopped by: Dorita Sciara, MD   ondansetron 8 MG tablet Commonly known as: Zofran Stopped by: Dorita Sciara, MD   prochlorperazine 10 MG tablet Commonly known as: COMPAZINE Stopped by: Dorita Sciara, MD   traMADol 50 MG tablet Commonly known as: ULTRAM Stopped by: Dorita Sciara, MD     TAKE these medications  acetaminophen 325 MG tablet Commonly known as: TYLENOL Take 650 mg by mouth every 6 (six) hours as needed for headache.   CENTRUM WOMEN PO Take 1 tablet by mouth daily.   escitalopram 20 MG tablet Commonly known as: LEXAPRO Take 20 mg by mouth daily.   gabapentin 300 MG capsule Commonly known as: NEURONTIN TAKE 1 CAPSULE(300 MG) BY MOUTH THREE TIMES DAILY   ibuprofen 200 MG tablet Commonly known as: ADVIL Take 200 mg by mouth every 6 (six) hours as needed for headache or mild pain.   lidocaine-prilocaine cream Commonly known as:  EMLA Apply to affected area once         REVIEW OF SYSTEMS: A comprehensive ROS was conducted with the patient and is negative except as per HPI    OBJECTIVE:  VS: BP 132/84 (BP Location: Left Arm, Patient Position: Sitting, Cuff Size: Normal)   Pulse 82   Ht _0  (1.727 m)   Wt 189 lb 12.8 oz (86.1 kg)   SpO2 97%   BMI 28.86 kg/m    Wt Readings from Last 3 Encounters:  10/25/19 189 lb 12.8 oz (86.1 kg)  10/12/19 187 lb (84.8 kg)  10/06/19 187 lb (84.8 kg)     EXAM: General: Pt appears well and is in NAD  Neck: General: Supple without adenopathy. Thyroid: Thyroid size normal.  No goiter or nodules appreciated. No thyroid bruit.  Lungs: Clear with good BS bilat with no rales, rhonchi, or wheezes  Heart: Auscultation: RRR.  Abdomen: Normoactive bowel sounds, soft, nontender, without masses or organomegaly palpable  Extremities:  BL LE: No pretibial edema normal ROM and strength.  Skin: Hair: Texture and amount normal with gender appropriate distribution Skin Inspection: No rashes Skin Palpation: Skin temperature, texture, and thickness normal to palpation  Neuro: Cranial nerves: II - XII grossly intact  Motor: Normal strength throughout DTRs: 2+ and symmetric in UE without delay in relaxation phase  Mental Status: Judgment, insight: Intact Orientation: Oriented to time, place, and person Mood and affect: No depression, anxiety, or agitation     DATA REVIEWED: Results for Abigail Berg (MRN [id number]) as of 10/27/2019 08:14  Ref. Range 10/25/2019 12:46 10/25/2019 12:48  Sodium Latest Ref Range: 135 - 146 mmol/L 142   Potassium Latest Ref Range: 3.5 - 5.3 mmol/L 4.5   Chloride Latest Ref Range: 98 - 110 mmol/L 106   CO2 Latest Ref Range: 20 - 32 mmol/L 27   Glucose Latest Ref Range: 65 - 99 mg/dL 98   BUN Latest Ref Range: 7 - 25 mg/dL 14   Creatinine Latest Ref Range: 0.50 - 1.05 mg/dL 0.83   Calcium Latest Ref Range: 8.6 - 10.4 mg/dL 11.4 (H)   BUN/Creatinine  Ratio Latest Ref Range: 6 - 22 (calc) NOT APPLICABLE   Vitamin D, 25-Hydroxy Latest Ref Range: 30 - 100 ng/mL 18 (L)   PTH, Intact Latest Ref Range: 14 - 64 pg/mL  87 (H)  Albumin MSPROF Latest Ref Range: 3.6 - 5.1 g/dL 4.2     Results for Abigail Berg (MRN [id number]) as of 10/25/2019 11:56  Ref. Range 06/17/2019 14:18 06/17/2019 14:19  PTH, Intact Latest Ref Range: 15 - 65 pg/mL  58  PTH-related peptide Latest Units: pmol/L <2.0   Calcium, Total (PTH) Latest Ref Range: 8.7 - 10.2 mg/dL  11.3 (H)  PTH Interp Unknown  Comment    ASSESSMENT/PLAN/RECOMMENDATIONS:   1. Hyperparathyroidism:  We discussed differential of familial hypercalcinuric hypercalcemia ([place]) vs Primary Hyperparathyroidism (pHPT)  -  It is important to differentiate between the two, as [place] does not cause any organ damage and does not require further follow up , on the other hand pHPT could cause end organ damage and would require further evaluation.   Will proceed with 24-hr urine collection and DXA scan ( Printed orders provided to be done in [place], [place]) -Corrected serum calcium is 11.24  Recommendations : - Stay Hydrated - Avoid over the counter calcium  - Consume 2-3 servings of low fat dairy daily   2.  Vitamin D deficiency:   -We will replenish with OTC vitamin D3 2000 IU daily     Follow-up in 2 months  Signed electronically by: Mack Guise, MD  [facility]  [facility] [address] [place], [place] [address] Phone: [phone] FAX: [phone]   CC: Patient, [name] No address on file Phone: None Fax: None   Return to Endocrinology clinic as below: Future Appointments  Date Time Provider [place]  10/25/2019  1:00 PM Keedan Sample, Melanie Crazier, MD [facility] [place]  10/25/2019  3:00 PM [name] E., PA-C [facility] None  11/03/2019 12:45 PM [facility] [place] [facility] None  11/03/2019  1:15 PM [facility] INFUSION [facility]  None  11/04/2019  4:05 PM [facility] ECHO 5 [facility]  11/24/2019 11:45 AM [facility] [place] [facility] None  11/24/2019 12:15 PM [facility] INFUSION [facility] None  12/15/2019 12:00 PM [facility] LAB [facility] None  12/15/2019 12:15 PM [facility] [place] [facility] None  12/15/2019 12:40 PM Truitt Merle, MD [facility] None  12/15/2019  1:15 PM [facility] INFUSION [facility] None  01/16/2020  3:15 PM Suanne Marker, PTA [facility] None

## 2019-10-25 NOTE — Patient Instructions (Signed)
Drug Rash  A drug rash occurs when a medicine causes a change in the color or texture of the skin. It can develop minutes, hours, or days after you take the medicine. The rash may appear on a small area of skin or all over your body. What are the causes? This condition is usually caused by your body's reaction (allergy) to a medicine. It can also be caused by exposure to sunlight after taking a medicine that makes your skin sensitive to light. Though any medicine can cause a rash or reaction, medicines that are more likely to cause rashes include:  Penicillin.  Antibiotic medicines.  Medicines that treat seizures.  Medicines that treat cancer (chemotherapy).  Aspirin and other NSAIDs.  Injectable dyes that contain iodine.  Insulin. What are the signs or symptoms? Symptoms of this condition include:  Redness.  Tiny bumps.  Peeling.  Itching.  Itchy welts (hives).  Swelling. How is this diagnosed? This condition may be diagnosed based on:  A physical exam.  Tests to find out which medicine caused the rash. These tests may include: ? Skin tests. ? Blood tests. ? Challenge test. For this test, you stop taking all the medicines that you do not need to take. Then, you start taking them again by adding back one medicine at a time. How is this treated? This condition is treated with medicines, including:  Antihistamine. This may be given to relieve itching.  NSAIDs. These may be given to reduce swelling and to treat pain.  A steroid medicine. This may be given to reduce swelling. The rash usually goes away when you stop taking the medicine that caused it. Follow these instructions at home:  Take over-the-counter and prescription medicines only as told by your health care provider.  Tell all your health care providers about any medicine reactions that you have had in the past.  If your rash was caused by sensitivity to sunlight, and while your rash is  healing: ? Avoid being in the sun if possible, especially when it is strongest, usually between 10 a.m. and 4 p.m. ? Cover your skin with pants, long sleeves, and a hat when you are exposed to sunlight.  If you have hives: ? Take a cool shower or use a cool compress to relieve itchiness. ? Take over-the-counter antihistamines, as recommended by your health care provider, until the hives are gone. Hives are not contagious.  Keep all follow-up visits as told by your health care provider. This is important. Contact a health care provider if you have:  A fever.  A rash that is not going away.  A rash that gets worse.  A rash that comes back.  Wheezing or coughing. Get help right away if:  You start to have breathing problems.  You start to have shortness of breath.  Your face or throat starts to swell.  You have severe weakness with dizziness or fainting.  You have chest pain. These symptoms may represent a serious problem that is an emergency. Do not wait to see if the symptoms will go away. Get medical help right away. Call your local emergency services (911 in the [place] Do not drive yourself to the hospital. Summary  A drug rash occurs when a medicine causes a change in the color or texture of the skin. The rash may appear on a small area of skin or all over your body.  It can develop minutes, hours, or days after you take the medicine.  Your health care   provider will do various tests to determine what medicine caused your rash.  The rash may be treated with medicine to relieve itching, swelling, and pain. This information is not intended to replace advice given to you by your health care provider. Make sure you discuss any questions you have with your health care provider. Document Revised: 01/02/2017 Document Reviewed: 12/11/2016 Elsevier Patient Education  2020 Elsevier Inc.  

## 2019-10-25 NOTE — Patient Instructions (Addendum)
-   Stay Hydrated - Avoid over the counter calcium  - Consume 2-3 servings of low fat dairy daily    24-Hour Urine Collection   You will be collecting your urine for a 24-hour period of time.  Your timer starts with your first urine of the morning (For example - If you first pee at [place], your timer will start at [place])  [place] away your first urine of the morning  Collect your urine every time you pee for the next 24 hours STOP your urine collection 24 hours after you started the collection (For example - You would stop at 9AM the day after you started)

## 2019-10-25 NOTE — Telephone Encounter (Signed)
TC from Pt with a c/o swollen face with red splotches under her eye. Pt is 2 weeks out of herceptin infusion. Pt scheduled to see Lucianne Lei today.

## 2019-10-26 LAB — BASIC METABOLIC PANEL
BUN: 14 mg/dL (ref 7–25)
CO2: 27 mmol/L (ref 20–32)
Calcium: 11.4 mg/dL — ABNORMAL HIGH (ref 8.6–10.4)
Chloride: 106 mmol/L (ref 98–110)
Creat: 0.83 mg/dL (ref 0.50–1.05)
Glucose, Bld: 98 mg/dL (ref 65–99)
Potassium: 4.5 mmol/L (ref 3.5–5.3)
Sodium: 142 mmol/L (ref 135–146)

## 2019-10-26 LAB — ALBUMIN: Albumin: 4.2 g/dL (ref 3.6–5.1)

## 2019-10-26 LAB — PARATHYROID HORMONE, INTACT (NO CA): PTH: 87 pg/mL — ABNORMAL HIGH (ref 14–64)

## 2019-10-26 LAB — VITAMIN D 25 HYDROXY (VIT D DEFICIENCY, FRACTURES): Vit D, 25-Hydroxy: 18 ng/mL — ABNORMAL LOW (ref 30–100)

## 2019-10-27 DIAGNOSIS — Z803 Family history of malignant neoplasm of breast: Secondary | ICD-10-CM | POA: Diagnosis not present

## 2019-10-27 DIAGNOSIS — E559 Vitamin D deficiency, unspecified: Secondary | ICD-10-CM | POA: Insufficient documentation

## 2019-10-27 DIAGNOSIS — Z79899 Other long term (current) drug therapy: Secondary | ICD-10-CM | POA: Diagnosis not present

## 2019-10-27 DIAGNOSIS — Z9049 Acquired absence of other specified parts of digestive tract: Secondary | ICD-10-CM | POA: Diagnosis not present

## 2019-10-27 DIAGNOSIS — Z86010 Personal history of colonic polyps: Secondary | ICD-10-CM | POA: Diagnosis not present

## 2019-10-27 DIAGNOSIS — Z8616 Personal history of COVID-19: Secondary | ICD-10-CM | POA: Diagnosis not present

## 2019-10-27 DIAGNOSIS — F329 Major depressive disorder, single episode, unspecified: Secondary | ICD-10-CM | POA: Diagnosis not present

## 2019-10-27 DIAGNOSIS — F419 Anxiety disorder, unspecified: Secondary | ICD-10-CM | POA: Diagnosis not present

## 2019-10-27 DIAGNOSIS — F17200 Nicotine dependence, unspecified, uncomplicated: Secondary | ICD-10-CM | POA: Diagnosis not present

## 2019-10-27 DIAGNOSIS — Z170 Estrogen receptor positive status [ER+]: Secondary | ICD-10-CM | POA: Diagnosis not present

## 2019-10-27 DIAGNOSIS — C50411 Malignant neoplasm of upper-outer quadrant of right female breast: Secondary | ICD-10-CM | POA: Diagnosis not present

## 2019-10-28 NOTE — Progress Notes (Signed)
Symptoms Management Clinic Progress Note   Abigail Berg [id number] 03/14/1965 54 y.o.  Abigail Berg is managed by Dr. Truitt Merle  Actively treated with chemotherapy/immunotherapy/hormonal therapy: yes  Current therapy: Taxol and Ogivri  Last treated: 10/12/2019 (cycle #3, day #22)  Next scheduled appointment with provider: 12/15/2019  Assessment: Plan:    Rash - Plan: dexamethasone (DECADRON) 10 mg in sodium chloride 0.9 % 50 mL IVPB, predniSONE (DELTASONE) 5 MG tablet, 0.9 %  sodium chloride infusion  Port-A-Cath in place - Plan: heparin lock flush 100 unit/mL, sodium chloride flush (NS) 0.9 % injection 10 mL  Malignant neoplasm of upper-outer quadrant of right breast in female, estrogen receptor positive ([place]) - Plan: heparin lock flush 100 unit/mL, sodium chloride flush (NS) 0.9 % injection 10 mL   Facial rash: The patient was given Decadron 10 mg IV x 1 and a prescription for a prednisone taper. She was told to use PO Benadryl as needed and to use hydrocortisone cream as needed.  ER positive malignant neoplasm of the right breast: The patient continues to be managed by Dr. Truitt Merle and is status post cycle 3, day 22 of Taxol and Ogivri which was dosed on 10/12/2019.  She is scheduled to return for treatments only prior to her follow-up appoint with Dr. Morey Hummingbird on 12/15/2019.  Please see After Visit Summary for patient specific instructions.  Future Appointments  Date Time Provider [place]  11/03/2019 12:45 PM [facility] [place] FLUSH [facility] None  11/03/2019  1:15 PM [facility] INFUSION [facility] None  11/04/2019  4:05 PM [facility] ECHO 5 [facility]  11/24/2019 11:45 AM [facility] [place] FLUSH [facility] None  11/24/2019 12:15 PM [facility] INFUSION [facility] None  12/15/2019 12:00 PM [facility] LAB [facility] None  12/15/2019 12:15 PM [facility] [place] FLUSH [facility] None  12/15/2019 12:40 PM Truitt Merle, MD [facility] None  12/15/2019  1:15 PM [facility]  INFUSION [facility] None  01/02/2020  3:00 PM Shamleffer, Melanie Crazier, MD [facility] None  01/16/2020  3:15 PM Suanne Marker, PTA [facility] None    No orders of the defined types were placed in this encounter.      Subjective:   Patient ID:  Abigail Berg is a 54 y.o. (DOB 1965/08/05) female.  Chief Complaint: No chief complaint on file.   HPI Abigail Berg  is a 54 y.o. female with a diagnosis of an ER positive malignant neoplasm of the right breast.  She is followed by Dr. Truitt Merle and is status post cycle 3, day 22 of Taxol and Ogivri which was dosed on 10/12/2019.  She presents to the clinic today with a recently developed facial rash.  The rash is erythematous and pruritic.  She denies any changes in activity.  She denies fevers, chills, sweats, shortness of breath, chest tightness or difficulty swallowing.  Medications: I have reviewed the patient's current medications.  Allergies: No Known Allergies  Past Medical History:  Diagnosis Date  . Depression   . Family history of colon cancer   . Monoallelic mutation of CHEK2 gene in female patient   . Personal history of colonic polyps   . S/P partial colectomy     Past Surgical History:  Procedure Laterality Date  . BREAST LUMPECTOMY WITH RADIOACTIVE SEED AND SENTINEL LYMPH NODE BIOPSY Right 06/30/2019   Procedure: RIGHT BREAST LUMPECTOMY WITH RADIOACTIVE SEED AND RIGHT AXILLARY SENTINEL NODE BIOPSY;  Surgeon: Rolm Bookbinder, MD;  Location: [facility];  Service: General;  Laterality: Right;  PEC BLOCK  .  BREAST SURGERY    . COLON SURGERY     Partial colectomy  . COLONOSCOPY    . POLYPECTOMY    . PORTACATH PLACEMENT Left 06/30/2019   Procedure: INSERTION PORT-A-CATH WITH ULTRASOUND GUIDANCE;  Surgeon: Emelia Loron, MD;  Location: [facility];  Service: General;  Laterality: Left;    Family History  Problem Relation Age of Onset  . Colon cancer Maternal Grandmother 1  . Colon  cancer Cousin 50  . Colon cancer Maternal Grandfather 75  . Cancer Other        breast cancer in MGM's sister   . Colon polyps Neg Hx   . Rectal cancer Neg Hx   . Stomach cancer Neg Hx   . Esophageal cancer Neg Hx     Social History   Socioeconomic History  . Marital status: Married    Spouse name: Not on file  . Number of children: Not on file  . Years of education: Not on file  . Highest education level: Not on file  Occupational History  . Not on file  Tobacco Use  . Smoking status: Current Every Day Smoker    Packs/day: 0.50    Years: 25.00    Pack years: 12.50  . Smokeless tobacco: Never Used  Vaping Use  . Vaping Use: Never used  Substance and Sexual Activity  . Alcohol use: Never  . Drug use: Never  . Sexual activity: Not on file  Other Topics Concern  . Not on file  Social History Narrative  . Not on file   Social Determinants of Health   Financial Resource Strain:   . Difficulty of Paying Living Expenses: Not on file  Food Insecurity:   . Worried About Programme researcher, broadcasting/film/video in the Last Year: Not on file  . Ran Out of Food in the Last Year: Not on file  Transportation Needs:   . Lack of Transportation (Medical): Not on file  . Lack of Transportation (Non-Medical): Not on file  Physical Activity:   . Days of Exercise per Week: Not on file  . Minutes of Exercise per Session: Not on file  Stress:   . Feeling of Stress : Not on file  Social Connections:   . Frequency of Communication with Friends and Family: Not on file  . Frequency of Social Gatherings with Friends and Family: Not on file  . Attends Religious Services: Not on file  . Active Member of Clubs or Organizations: Not on file  . Attends Banker Meetings: Not on file  . Marital Status: Not on file  Intimate Partner Violence:   . Fear of Current or Ex-Partner: Not on file  . Emotionally Abused: Not on file  . Physically Abused: Not on file  . Sexually Abused: Not on file     Past Medical History, Surgical history, Social history, and Family history were reviewed and updated as appropriate.   Please see review of systems for further details on the patient's review from today.   Review of Systems:  Review of Systems  Constitutional: Negative for chills, diaphoresis and fever.  HENT: Negative for facial swelling and trouble swallowing.   Respiratory: Negative for cough, chest tightness and shortness of breath.   Cardiovascular: Negative for chest pain.  Skin: Positive for rash.    Objective:   Physical Exam:  BP 133/70 (BP Location: Left Arm, Patient Position: Sitting)   Pulse 86   Temp 97.8 F (36.6 C) (Tympanic)   Resp  18   Ht $R'5\' 8"'cn$  (1.727 m)   Wt 192 lb (87.1 kg)   SpO2 100%   BMI 29.19 kg/m  ECOG: 0  Physical Exam Constitutional:      General: She is not in acute distress.    Appearance: She is not diaphoretic.  HENT:     Head: Normocephalic and atraumatic.      Mouth/Throat:     Pharynx: No oropharyngeal exudate.  Eyes:     General: No scleral icterus.       Right eye: No discharge.        Left eye: No discharge.  Cardiovascular:     Rate and Rhythm: Normal rate and regular rhythm.     Heart sounds: Normal heart sounds. No murmur heard.  No friction rub. No gallop.   Pulmonary:     Effort: Pulmonary effort is normal. No respiratory distress.     Breath sounds: Normal breath sounds. No wheezing or rales.  Musculoskeletal:        General: No tenderness or deformity.  Skin:    General: Skin is warm and dry.     Findings: Rash present. No erythema.  Neurological:     Mental Status: She is alert.     Coordination: Coordination normal.     Gait: Gait normal.     Lab Review:     Component Value Date/Time   NA 142 10/25/2019 1246   K 4.5 10/25/2019 1246   CL 106 10/25/2019 1246   CO2 27 10/25/2019 1246   GLUCOSE 98 10/25/2019 1246   BUN 14 10/25/2019 1246   CREATININE 0.83 10/25/2019 1246   CALCIUM 11.4 (H) 10/25/2019  1246   CALCIUM 11.3 (H) 06/17/2019 1419   PROT 6.7 10/12/2019 1240   ALBUMIN 3.6 10/12/2019 1240   AST 16 10/12/2019 1240   ALT 18 10/12/2019 1240   ALKPHOS 73 10/12/2019 1240   BILITOT 0.3 10/12/2019 1240   GFRNONAA >60 10/12/2019 1240   GFRAA >60 10/12/2019 1240       Component Value Date/Time   WBC 6.0 10/12/2019 1240   RBC 4.00 10/12/2019 1240   HGB 13.2 10/12/2019 1240   HCT 38.1 10/12/2019 1240   PLT 287 10/12/2019 1240   MCV 95.3 10/12/2019 1240   MCH 33.0 10/12/2019 1240   MCHC 34.6 10/12/2019 1240   RDW 15.7 (H) 10/12/2019 1240   LYMPHSABS 2.5 10/12/2019 1240   MONOABS 0.3 10/12/2019 1240   EOSABS 0.2 10/12/2019 1240   BASOSABS 0.1 10/12/2019 1240   -------------------------------  Imaging from last 24 hours (if applicable):  Radiology interpretation: No results found.

## 2019-10-31 ENCOUNTER — Encounter: Payer: Self-pay | Admitting: *Deleted

## 2019-11-01 ENCOUNTER — Other Ambulatory Visit: Payer: Self-pay | Admitting: Internal Medicine

## 2019-11-01 DIAGNOSIS — E213 Hyperparathyroidism, unspecified: Secondary | ICD-10-CM | POA: Diagnosis not present

## 2019-11-02 LAB — CALCIUM, URINE, 24 HOUR
Calcium, 24H Urine: 518 mg/24 hr — ABNORMAL HIGH (ref 0–320)
Calcium, Urine: 16.7 mg/dL

## 2019-11-03 ENCOUNTER — Other Ambulatory Visit: Payer: BC Managed Care – PPO

## 2019-11-03 ENCOUNTER — Other Ambulatory Visit: Payer: Self-pay

## 2019-11-03 ENCOUNTER — Ambulatory Visit: Payer: BC Managed Care – PPO

## 2019-11-03 ENCOUNTER — Inpatient Hospital Stay: Payer: BC Managed Care – PPO

## 2019-11-03 ENCOUNTER — Ambulatory Visit: Payer: BC Managed Care – PPO | Admitting: Hematology

## 2019-11-03 VITALS — BP 131/70 | HR 75 | Temp 98.5°F | Resp 16

## 2019-11-03 DIAGNOSIS — C50411 Malignant neoplasm of upper-outer quadrant of right female breast: Secondary | ICD-10-CM | POA: Diagnosis not present

## 2019-11-03 DIAGNOSIS — Z5111 Encounter for antineoplastic chemotherapy: Secondary | ICD-10-CM | POA: Diagnosis not present

## 2019-11-03 DIAGNOSIS — Z23 Encounter for immunization: Secondary | ICD-10-CM | POA: Diagnosis not present

## 2019-11-03 DIAGNOSIS — Z170 Estrogen receptor positive status [ER+]: Secondary | ICD-10-CM

## 2019-11-03 DIAGNOSIS — Z5112 Encounter for antineoplastic immunotherapy: Secondary | ICD-10-CM | POA: Diagnosis not present

## 2019-11-03 DIAGNOSIS — Z95828 Presence of other vascular implants and grafts: Secondary | ICD-10-CM

## 2019-11-03 DIAGNOSIS — R21 Rash and other nonspecific skin eruption: Secondary | ICD-10-CM | POA: Diagnosis not present

## 2019-11-03 MED ORDER — ACETAMINOPHEN 325 MG PO TABS
ORAL_TABLET | ORAL | Status: AC
  Filled 2019-11-03: qty 2

## 2019-11-03 MED ORDER — SODIUM CHLORIDE 0.9% FLUSH
10.0000 mL | INTRAVENOUS | Status: DC | PRN
  Administered 2019-11-03: 10 mL
  Filled 2019-11-03: qty 10

## 2019-11-03 MED ORDER — DIPHENHYDRAMINE HCL 25 MG PO CAPS
ORAL_CAPSULE | ORAL | Status: AC
  Filled 2019-11-03: qty 2

## 2019-11-03 MED ORDER — HEPARIN SOD (PORK) LOCK FLUSH 100 UNIT/ML IV SOLN
500.0000 [IU] | Freq: Once | INTRAVENOUS | Status: AC | PRN
  Administered 2019-11-03: 500 [IU]
  Filled 2019-11-03: qty 5

## 2019-11-03 MED ORDER — TRASTUZUMAB-ANNS CHEMO 150 MG IV SOLR
6.0000 mg/kg | Freq: Once | INTRAVENOUS | Status: AC
  Administered 2019-11-03: 504 mg via INTRAVENOUS
  Filled 2019-11-03: qty 24

## 2019-11-03 MED ORDER — INFLUENZA VAC SPLIT QUAD 0.5 ML IM SUSY
0.5000 mL | PREFILLED_SYRINGE | Freq: Once | INTRAMUSCULAR | Status: AC
  Administered 2019-11-03: 0.5 mL via INTRAMUSCULAR

## 2019-11-03 MED ORDER — INFLUENZA VAC A&B SA ADJ QUAD 0.5 ML IM PRSY: 0.5000 mL | PREFILLED_SYRINGE | Freq: Once | INTRAMUSCULAR | Status: DC

## 2019-11-03 MED ORDER — SODIUM CHLORIDE 0.9 % IV SOLN
Freq: Once | INTRAVENOUS | Status: AC
  Filled 2019-11-03: qty 250

## 2019-11-03 MED ORDER — ACETAMINOPHEN 325 MG PO TABS
650.0000 mg | ORAL_TABLET | Freq: Once | ORAL | Status: AC
  Administered 2019-11-03: 650 mg via ORAL

## 2019-11-03 MED ORDER — SODIUM CHLORIDE 0.9% FLUSH
10.0000 mL | Freq: Once | INTRAVENOUS | Status: AC
  Administered 2019-11-03: 10 mL
  Filled 2019-11-03: qty 10

## 2019-11-03 MED ORDER — DIPHENHYDRAMINE HCL 25 MG PO CAPS
50.0000 mg | ORAL_CAPSULE | Freq: Once | ORAL | Status: AC
  Administered 2019-11-03: 50 mg via ORAL

## 2019-11-03 MED ORDER — INFLUENZA VAC SPLIT QUAD 0.5 ML IM SUSY
PREFILLED_SYRINGE | INTRAMUSCULAR | Status: AC
  Filled 2019-11-03: qty 0.5

## 2019-11-04 ENCOUNTER — Ambulatory Visit (HOSPITAL_COMMUNITY): Payer: BC Managed Care – PPO | Attending: Cardiology

## 2019-11-04 DIAGNOSIS — R9431 Abnormal electrocardiogram [ECG] [EKG]: Secondary | ICD-10-CM | POA: Insufficient documentation

## 2019-11-04 DIAGNOSIS — Z79899 Other long term (current) drug therapy: Secondary | ICD-10-CM | POA: Insufficient documentation

## 2019-11-05 LAB — ECHOCARDIOGRAM COMPLETE
AR max vel: 1.86 cm2
AV Area VTI: 1.87 cm2
AV Area mean vel: 1.92 cm2
AV Mean grad: 6 mmHg
AV Peak grad: 12.7 mmHg
Ao pk vel: 1.78 m/s
Area-P 1/2: 3.38 cm2
S' Lateral: 2.75 cm

## 2019-11-08 ENCOUNTER — Other Ambulatory Visit: Payer: Self-pay | Admitting: *Deleted

## 2019-11-08 ENCOUNTER — Encounter: Payer: Self-pay | Admitting: Internal Medicine

## 2019-11-08 DIAGNOSIS — E210 Primary hyperparathyroidism: Secondary | ICD-10-CM

## 2019-11-08 DIAGNOSIS — Z79899 Other long term (current) drug therapy: Secondary | ICD-10-CM

## 2019-11-11 DIAGNOSIS — Z510 Encounter for antineoplastic radiation therapy: Secondary | ICD-10-CM | POA: Diagnosis not present

## 2019-11-11 DIAGNOSIS — C50411 Malignant neoplasm of upper-outer quadrant of right female breast: Secondary | ICD-10-CM | POA: Diagnosis not present

## 2019-11-21 DIAGNOSIS — Z719 Counseling, unspecified: Secondary | ICD-10-CM | POA: Diagnosis not present

## 2019-11-21 DIAGNOSIS — E663 Overweight: Secondary | ICD-10-CM | POA: Diagnosis not present

## 2019-11-21 DIAGNOSIS — Z9229 Personal history of other drug therapy: Secondary | ICD-10-CM | POA: Diagnosis not present

## 2019-11-21 DIAGNOSIS — Z01419 Encounter for gynecological examination (general) (routine) without abnormal findings: Secondary | ICD-10-CM | POA: Diagnosis not present

## 2019-11-21 DIAGNOSIS — Z510 Encounter for antineoplastic radiation therapy: Secondary | ICD-10-CM | POA: Diagnosis not present

## 2019-11-21 DIAGNOSIS — R928 Other abnormal and inconclusive findings on diagnostic imaging of breast: Secondary | ICD-10-CM | POA: Diagnosis not present

## 2019-11-21 DIAGNOSIS — C50411 Malignant neoplasm of upper-outer quadrant of right female breast: Secondary | ICD-10-CM | POA: Diagnosis not present

## 2019-11-23 ENCOUNTER — Encounter: Payer: Self-pay | Admitting: *Deleted

## 2019-11-23 DIAGNOSIS — Z510 Encounter for antineoplastic radiation therapy: Secondary | ICD-10-CM | POA: Diagnosis not present

## 2019-11-23 DIAGNOSIS — C50411 Malignant neoplasm of upper-outer quadrant of right female breast: Secondary | ICD-10-CM | POA: Diagnosis not present

## 2019-11-23 NOTE — Progress Notes (Signed)
[place]   [phone] Fax:([phone]) [phone]   Clinic Follow up Note   Patient Care Team: Patient, [name] as PCP - General ([place]) Minus Breeding, MD as PCP - Cardiology (Cardiology) Armbruster, Carlota Raspberry, MD as Consulting Physician (Gastroenterology) Eyvonne Mechanic as Counselor (Licensed Clinical Social Worker) Truitt Merle, MD as Consulting Physician (Hematology) Alla Feeling, NP as Nurse Practitioner (Nurse Practitioner) Mauro Kaufmann, RN as Oncology Nurse Navigator Rockwell Germany, RN as Oncology Nurse Navigator Rolm Bookbinder, MD as Consulting Physician (General Surgery)  Date of Service:  11/24/2019  CHIEF COMPLAINT: F/u ofRight breast cancer  SUMMARY OF ONCOLOGIC HISTORY: Oncology History Overview Note  Cancer Staging Malignant neoplasm of upper-outer quadrant of right breast in female, estrogen receptor positive ([place]) Staging form: Breast, AJCC 8th Edition - Clinical stage from 06/01/2019: Stage IA (cT1c, cN0, cM0, G3, ER+, PR-, HER2+) - Signed by Truitt Merle, MD on 06/17/2019 - Pathologic stage from 06/30/2019: Stage IA (pT1c, pN0, cM0, G3, ER+, PR-, HER2+) - Signed by Truitt Merle, MD on 07/15/2019    Malignant neoplasm of upper-outer quadrant of right breast in female, estrogen receptor positive ([place])  10/2018 -  Anti-estrogen oral therapy   She was preventively started on Tamoxifen since 10/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 Held after 06/17/19 to proceed with her breast cancer surgery and chemo.    05/06/2019 Breast MRI   IMPRESSION Enhancing right breast mass suspicious for malignancy. Second look [place] if recommended.  No Suspicious enhancing left breast masses.    05/24/2019 Breast US   IMPRESSION 1.8cm irregular right breast mass within the 9:30 position 3.5cm from the nipple is highly suggestive of malignancy. An Ultrasound guided breast biopsy is recommended. The findings of the study and recommendation for biopsy were  discussed with the patient directly.    06/01/2019 Cancer Staging   Staging form: Breast, AJCC 8th Edition - Clinical stage from 06/01/2019: Stage IA (cT1c, cN0, cM0, G3, ER+, PR-, HER2+) - Signed by Truitt Merle, MD on 06/17/2019   06/01/2019 Initial Biopsy   Breast Biopsy - Right Breast Core Bx DIAGNOSIS INFILTRATING DUCT CARCINOMA WITH MINOR DUCTAL CARCINOMA IN SITU COMPNENT OF RIGHT BREAST, CORE NEEDLE BIOPSY    06/01/2019 Receptors her2   ER: greater than 75% of tumor cells show moderate staining  PR: Negative  HER2: Positive 3+ Ki67: 40%   06/17/2019 Initial Diagnosis   Malignant neoplasm of upper-outer quadrant of right breast in female, estrogen receptor positive ([place])   06/30/2019 Surgery   RIGHT BREAST LUMPECTOMY WITH RADIOACTIVE SEED AND RIGHT AXILLARY SENTINEL NODE BIOPSY and PAC placement  by Dr Donne Hazel     06/30/2019 Pathology Results   FINAL MICROSCOPIC DIAGNOSIS:   A. BREAST, RIGHT, LUMPECTOMY:  - Invasive ductal carcinoma, grade 3, spanning 1.5 cm.  - Intermediate grade ductal carcinoma in situ.  - Invasive carcinoma is 0.1-0.2 from the final lateral margin (part E).  - Margins are negative for in situ carcinoma.  - Biopsy site.  - See oncology table.   B. LYMPH NODE, RIGHT AXILLARY, SENTINEL, BIOPSY:  - One of one lymph nodes negative for carcinoma (0/1).   C. LYMPH NODE, RIGHT AXILLARY, SENTINEL, BIOPSY:  - One of one lymph nodes negative for carcinoma (0/1).   D. LYMPH NODE, RIGHT AXILLARY, SENTINEL, BIOPSY:  - One of one lymph nodes negative for carcinoma (0/1).   E. BREAST, RIGHT ADDITIONAL LATERAL MARGIN, EXCISION:  - Invasive ductal carcinoma.  - Invasive carcinoma is  0.1-0.2 cm from the new lateral margin.   F. BREAST, RIGHT ADDITIONAL POSTERIOR MARGIN, EXCISION:  - Fibrocystic change and usual ductal hyperplasia.  - No malignancy identified.   G. BREAST, RIGHT ADDITIONAL SUPERIOR MARGIN, EXCISION:  - Fibrocystic change and usual ductal  hyperplasia.  - Incidental radial scar.  - No malignancy identified.     PROGNOSTIC INDICATOR RESULTS:  The tumor cells are POSITIVE for Her2 (3+).  Estrogen Receptor:       POSITIVE, 85%, MODERATE STAINING  Progesterone Receptor:   NEGATIVE  Proliferation Marker Ki-67:   25%    06/30/2019 Cancer Staging   Staging form: Breast, AJCC 8th Edition - Pathologic stage from 06/30/2019: Stage IA (pT1c, pN0, cM0, G3, ER+, PR-, HER2+) - Signed by Truitt Merle, MD on 07/15/2019   07/29/2019 -  Chemotherapy   Adjuvant Weekly Abraxane and Transtuzumab for 12 weeks starting 07/29/19-10/12/19, followed by maintenance Herceptin q3weeks starting 11/03/19 to complete 1 year of treatment. Due to headaches her Herceptin (Ogivr) was switched to Herceptin Calla Kicks) on 09/08/19. Due to her insurance denial of Abraxane, I changed it to paclitaxel starting 09/15/19.      CURRENT THERAPY:  -Maintenance Herceptin (Kanjinti) q3weeks starting 11/03/19 to complete 1 year of treatment from 07/29/19.  INTERVAL HISTORY:  Abigail Berg is here for a follow up and treatment. She presents to the clinic with her girl friend.  She underwent mammogram, November 21, 2019 at her GYN office in [place].  This showed an oval mass in the upper right breast, 11 cm from nipple, likely intramammary lymph node.  She is very concerned.  She is scheduled for ultrasound tomorrow.  She otherwise feels well, denies any new symptoms. All other systems were reviewed with the patient and are negative.  MEDICAL HISTORY:  Past Medical History:  Diagnosis Date  . Depression   . Family history of colon cancer   . Monoallelic mutation of CHEK2 gene in female patient   . Personal history of colonic polyps   . S/P partial colectomy     SURGICAL HISTORY: Past Surgical History:  Procedure Laterality Date  . BREAST LUMPECTOMY WITH RADIOACTIVE SEED AND SENTINEL LYMPH NODE BIOPSY Right 06/30/2019   Procedure: RIGHT BREAST LUMPECTOMY WITH RADIOACTIVE SEED  AND RIGHT AXILLARY SENTINEL NODE BIOPSY;  Surgeon: Rolm Bookbinder, MD;  Location: [facility];  Service: General;  Laterality: Right;  PEC BLOCK  . BREAST SURGERY    . COLON SURGERY     Partial colectomy  . COLONOSCOPY    . POLYPECTOMY    . PORTACATH PLACEMENT Left 06/30/2019   Procedure: INSERTION PORT-A-CATH WITH ULTRASOUND GUIDANCE;  Surgeon: Rolm Bookbinder, MD;  Location: [facility];  Service: General;  Laterality: Left;    I have reviewed the social history and family history with the patient and they are unchanged from previous note.  ALLERGIES:  has No Known Allergies.  MEDICATIONS:  Current Outpatient Medications  Medication Sig Dispense Refill  . lidocaine-prilocaine (EMLA) cream Apply to affected area once 30 g 3  . acetaminophen (TYLENOL) 325 MG tablet Take 650 mg by mouth every 6 (six) hours as needed for headache.    . escitalopram (LEXAPRO) 20 MG tablet Take 20 mg by mouth daily.     Marland Kitchen gabapentin (NEURONTIN) 300 MG capsule TAKE 1 CAPSULE(300 MG) BY MOUTH THREE TIMES DAILY 30 capsule 2  . ibuprofen (ADVIL) 200 MG tablet Take 200 mg by mouth every 6 (six) hours as needed for headache or mild  pain.    . Multiple Vitamins-Minerals (CENTRUM WOMEN PO) Take 1 tablet by mouth daily.    . predniSONE (DELTASONE) 5 MG tablet 6 tab x 2 days, 5 tab x 2 days, 4 tab x 2 days, 3 tab x 2 days, 2 tab x 2 days, 1 tab x 2 days, stop 42 tablet 0   No current facility-administered medications for this visit.    PHYSICAL EXAMINATION: ECOG PERFORMANCE STATUS: 0 - Asymptomatic  Vitals:   11/24/19 1049  BP: (!) 125/53  Pulse: 89  Resp: 17  Temp: 98.3 F (36.8 C)  SpO2: 100%   Filed Weights   11/24/19 1049  Weight: 195 lb 4.8 oz (88.6 kg)    GENERAL:alert, no distress and comfortable SKIN: skin color, texture, turgor are normal, no rashes or significant lesions EYES: normal, Conjunctiva are pink and non-injected, sclera clear NECK: supple,  thyroid normal size, non-tender, without nodularity LYMPH:  no palpable lymphadenopathy in the cervical, axillary  LUNGS: clear to auscultation and percussion with normal breathing effort HEART: regular rate & rhythm and no murmurs and no lower extremity edema ABDOMEN:abdomen soft, non-tender and normal bowel sounds Musculoskeletal:no cyanosis of digits and no clubbing  NEURO: alert & oriented x 3 with fluent speech, no focal motor/sensory deficits Breasts: Breast inspection showed them to be symmetrical with no nipple discharge. The surgical incision around right areola and axilla are healed well, there is some soft tissue fullness in right axilla above the incision, no palpable mass or node. Palpation of the breasts and axilla otherwise revealed no obvious mass that I could appreciate.   LABORATORY DATA:  I have reviewed the data as listed CBC Latest Ref Rng & Units 10/12/2019 10/06/2019 09/29/2019  WBC 4.0 - 10.5 K/uL 6.0 6.5 5.6  Hemoglobin 12.0 - 15.0 g/dL 13.2 13.3 13.2  Hematocrit 36 - 46 % 38.1 38.2 38.0  Platelets 150 - 400 K/uL 287 286 220     CMP Latest Ref Rng & Units 10/25/2019 10/12/2019 10/06/2019  Glucose 65 - 99 mg/dL 98 98 129(H)  BUN 7 - 25 mg/dL $Remove'14 13 13  'MvVMwvo$ Creatinine 0.50 - 1.05 mg/dL 0.83 0.76 0.79  Sodium 135 - 146 mmol/L 142 139 140  Potassium 3.5 - 5.3 mmol/L 4.5 4.1 4.1  Chloride 98 - 110 mmol/L 106 108 107  CO2 20 - 32 mmol/L $RemoveB'27 26 25  'CObcPkNQ$ Calcium 8.6 - 10.4 mg/dL 11.4(H) 10.9(H) 10.6(H)  Total Protein 6.5 - 8.1 g/dL - 6.7 6.7  Total Bilirubin 0.3 - 1.2 mg/dL - 0.3 0.4  Alkaline Phos 38 - 126 U/L - 73 80  AST 15 - 41 U/L - 16 17  ALT 0 - 44 U/L - 18 21      RADIOGRAPHIC STUDIES: I have personally reviewed the radiological images as listed and agreed with the findings in the report. No results found.   ASSESSMENT & PLAN:  Abigail Berg is a 54 y.o. female with    1.Malignant neoplasm of upper-outer quadrant of right breast,Stage1A,p(T1cN0M0),stage  IA,ER+/PR-/HER2+, Grade3. -She was diagnosed in 05/2019.She is s/pright breast lumpectomy.  -Given her high risk HER2 positive breast cancer,she completed chemotherapy withweekly Abraxane and Herceptin6/25/21-10/12/19. She started maintenance Hereptin (Kanjinti) on 11/03/19 to complete 1 year of therapy.   -She will proceed with adjuvant radiation to reduce her local risk of recurrence with Local Rad onc. It's scheduled to start next Monday, but will be postponed due to her recent abnormal mammogram. -I reviewed her outside mammogram report, which showed a  1 cm mass in the upper outer quadrant of the right breast, 11 cm from nipple, likely intramammary lymph node.  I do not feel a lymph node or mass in the area.  She is scheduled for ultrasound tomorrow for further evaluation.  I explained to her that we could biopsy if it looks suspicious.  -Patient is leaning towards mastectomy if she has recurrent cancer. -Follow-up sooner if ultrasound is abnormal.   2. 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 -She will f/u with Dr. Havery Moros for screening colonoscopies, last done 07/2018 with12polyps. I recommend to repeat in next 2 years.  -Shewaspreviouslyonchemoprevention withTamoxifen 10/2018-06/17/19, plan to restart after she completes adjuvant radiation.  3.She has been counseled onSmoking Cessation. Currently smoking 1/2 ppd.  4. S/p ileocecectomy on 08/14/15 due to benign colon polyp invading appendix, history of recurrent colon polyps  5.H/o right breast fibroadenoma,S/p rightsurgical resection on 11/22/15  6. Hypercalcemia, likelyprimary hyperparathyroidism -She is not on Calcium or Vit D. Ipreviouslyrecommendshetake a multivitamin which contains low-dose calcium -Her PTH was within normal limits, but on the  high end. PTHrp was negative. This is likelyprimary hyperparathyroidism. -She was referred to endocrine, seen on 10/25/19   PLAN: -Outside mammogram report reviewed, and will give her mammogram images to our radiology to be uploaded -She is scheduled for right breast ultrasound tomorrow, she will call me if it is abnormal -Follow-up in 3 weeks, or sooner if needed. -We will proceed with trastuzumab infusion today   No problem-specific Assessment & Plan notes found for this encounter.   No orders of the defined types were placed in this encounter.  All questions were answered. The patient knows to call the clinic with any problems, questions or concerns. No barriers to learning was detected. The total time spent in the appointment was 20 minutes.     Truitt Merle, MD 11/24/2019   I, Joslyn Devon, am acting as scribe for Truitt Merle, MD.   I have reviewed the above documentation for accuracy and completeness, and I agree with the above.

## 2019-11-24 ENCOUNTER — Ambulatory Visit: Payer: BC Managed Care – PPO

## 2019-11-24 ENCOUNTER — Inpatient Hospital Stay: Payer: BC Managed Care – PPO | Attending: Hematology

## 2019-11-24 ENCOUNTER — Other Ambulatory Visit: Payer: Self-pay

## 2019-11-24 ENCOUNTER — Encounter: Payer: Self-pay | Admitting: Hematology

## 2019-11-24 ENCOUNTER — Other Ambulatory Visit: Payer: BC Managed Care – PPO

## 2019-11-24 ENCOUNTER — Ambulatory Visit: Payer: BC Managed Care – PPO | Admitting: Hematology

## 2019-11-24 ENCOUNTER — Inpatient Hospital Stay: Payer: BC Managed Care – PPO

## 2019-11-24 ENCOUNTER — Inpatient Hospital Stay (HOSPITAL_BASED_OUTPATIENT_CLINIC_OR_DEPARTMENT_OTHER): Payer: BC Managed Care – PPO | Admitting: Hematology

## 2019-11-24 VITALS — BP 125/53 | HR 89 | Temp 98.3°F | Resp 17 | Ht 68.0 in | Wt 195.3 lb

## 2019-11-24 DIAGNOSIS — Z170 Estrogen receptor positive status [ER+]: Secondary | ICD-10-CM | POA: Diagnosis not present

## 2019-11-24 DIAGNOSIS — C50411 Malignant neoplasm of upper-outer quadrant of right female breast: Secondary | ICD-10-CM

## 2019-11-24 DIAGNOSIS — E213 Hyperparathyroidism, unspecified: Secondary | ICD-10-CM | POA: Diagnosis not present

## 2019-11-24 DIAGNOSIS — Z5112 Encounter for antineoplastic immunotherapy: Secondary | ICD-10-CM | POA: Insufficient documentation

## 2019-11-24 MED ORDER — SODIUM CHLORIDE 0.9% FLUSH
10.0000 mL | INTRAVENOUS | Status: DC | PRN
  Administered 2019-11-24: 10 mL
  Filled 2019-11-24: qty 10

## 2019-11-24 MED ORDER — DIPHENHYDRAMINE HCL 25 MG PO CAPS
ORAL_CAPSULE | ORAL | Status: AC
  Filled 2019-11-24: qty 2

## 2019-11-24 MED ORDER — HEPARIN SOD (PORK) LOCK FLUSH 100 UNIT/ML IV SOLN
500.0000 [IU] | Freq: Once | INTRAVENOUS | Status: AC | PRN
  Administered 2019-11-24: 500 [IU]
  Filled 2019-11-24: qty 5

## 2019-11-24 MED ORDER — ACETAMINOPHEN 325 MG PO TABS
ORAL_TABLET | ORAL | Status: AC
  Filled 2019-11-24: qty 2

## 2019-11-24 MED ORDER — TRASTUZUMAB-ANNS CHEMO 150 MG IV SOLR
6.0000 mg/kg | Freq: Once | INTRAVENOUS | Status: AC
  Administered 2019-11-24: 504 mg via INTRAVENOUS
  Filled 2019-11-24: qty 24

## 2019-11-24 MED ORDER — ACETAMINOPHEN 325 MG PO TABS
650.0000 mg | ORAL_TABLET | Freq: Once | ORAL | Status: AC
  Administered 2019-11-24: 650 mg via ORAL

## 2019-11-24 MED ORDER — DIPHENHYDRAMINE HCL 25 MG PO CAPS
50.0000 mg | ORAL_CAPSULE | Freq: Once | ORAL | Status: AC
  Administered 2019-11-24: 50 mg via ORAL

## 2019-11-24 MED ORDER — SODIUM CHLORIDE 0.9 % IV SOLN
Freq: Once | INTRAVENOUS | Status: AC
  Filled 2019-11-24: qty 250

## 2019-11-25 DIAGNOSIS — R928 Other abnormal and inconclusive findings on diagnostic imaging of breast: Secondary | ICD-10-CM | POA: Diagnosis not present

## 2019-11-29 ENCOUNTER — Other Ambulatory Visit (HOSPITAL_COMMUNITY): Payer: Self-pay | Admitting: Hematology

## 2019-11-29 ENCOUNTER — Other Ambulatory Visit: Payer: Self-pay

## 2019-11-29 ENCOUNTER — Inpatient Hospital Stay
Admission: RE | Admit: 2019-11-29 | Discharge: 2019-11-29 | Disposition: A | Discharge status: Home / Self Care | Payer: Self-pay | Source: Ambulatory Visit | Attending: Hematology | Admitting: Hematology

## 2019-11-29 ENCOUNTER — Other Ambulatory Visit: Payer: Self-pay | Admitting: Hematology

## 2019-11-29 DIAGNOSIS — Z170 Estrogen receptor positive status [ER+]: Secondary | ICD-10-CM

## 2019-11-29 DIAGNOSIS — C50411 Malignant neoplasm of upper-outer quadrant of right female breast: Secondary | ICD-10-CM

## 2019-11-29 DIAGNOSIS — C801 Malignant (primary) neoplasm, unspecified: Secondary | ICD-10-CM

## 2019-11-29 DIAGNOSIS — G609 Hereditary and idiopathic neuropathy, unspecified: Secondary | ICD-10-CM

## 2019-11-29 MED ORDER — GABAPENTIN 300 MG PO CAPS: 300.0000 mg | ORAL_CAPSULE | Freq: Three times a day (TID) | ORAL | 3 refills | Status: DC

## 2019-12-01 DIAGNOSIS — Z510 Encounter for antineoplastic radiation therapy: Secondary | ICD-10-CM | POA: Diagnosis not present

## 2019-12-01 DIAGNOSIS — C50411 Malignant neoplasm of upper-outer quadrant of right female breast: Secondary | ICD-10-CM | POA: Diagnosis not present

## 2019-12-06 DIAGNOSIS — C50411 Malignant neoplasm of upper-outer quadrant of right female breast: Secondary | ICD-10-CM | POA: Diagnosis not present

## 2019-12-06 DIAGNOSIS — Z170 Estrogen receptor positive status [ER+]: Secondary | ICD-10-CM | POA: Diagnosis not present

## 2019-12-06 DIAGNOSIS — Z510 Encounter for antineoplastic radiation therapy: Secondary | ICD-10-CM | POA: Diagnosis not present

## 2019-12-07 DIAGNOSIS — C50411 Malignant neoplasm of upper-outer quadrant of right female breast: Secondary | ICD-10-CM | POA: Diagnosis not present

## 2019-12-07 DIAGNOSIS — Z170 Estrogen receptor positive status [ER+]: Secondary | ICD-10-CM | POA: Diagnosis not present

## 2019-12-07 DIAGNOSIS — Z510 Encounter for antineoplastic radiation therapy: Secondary | ICD-10-CM | POA: Diagnosis not present

## 2019-12-08 ENCOUNTER — Encounter: Payer: Self-pay | Admitting: Internal Medicine

## 2019-12-08 DIAGNOSIS — Z510 Encounter for antineoplastic radiation therapy: Secondary | ICD-10-CM | POA: Diagnosis not present

## 2019-12-08 DIAGNOSIS — C50411 Malignant neoplasm of upper-outer quadrant of right female breast: Secondary | ICD-10-CM | POA: Diagnosis not present

## 2019-12-08 DIAGNOSIS — Z170 Estrogen receptor positive status [ER+]: Secondary | ICD-10-CM | POA: Diagnosis not present

## 2019-12-08 DIAGNOSIS — M85851 Other specified disorders of bone density and structure, right thigh: Secondary | ICD-10-CM | POA: Diagnosis not present

## 2019-12-08 DIAGNOSIS — Z780 Asymptomatic menopausal state: Secondary | ICD-10-CM | POA: Diagnosis not present

## 2019-12-09 DIAGNOSIS — Z510 Encounter for antineoplastic radiation therapy: Secondary | ICD-10-CM | POA: Diagnosis not present

## 2019-12-09 DIAGNOSIS — Z170 Estrogen receptor positive status [ER+]: Secondary | ICD-10-CM | POA: Diagnosis not present

## 2019-12-09 DIAGNOSIS — C50411 Malignant neoplasm of upper-outer quadrant of right female breast: Secondary | ICD-10-CM | POA: Diagnosis not present

## 2019-12-12 DIAGNOSIS — Z170 Estrogen receptor positive status [ER+]: Secondary | ICD-10-CM | POA: Diagnosis not present

## 2019-12-12 DIAGNOSIS — Z510 Encounter for antineoplastic radiation therapy: Secondary | ICD-10-CM | POA: Diagnosis not present

## 2019-12-12 DIAGNOSIS — C50411 Malignant neoplasm of upper-outer quadrant of right female breast: Secondary | ICD-10-CM | POA: Diagnosis not present

## 2019-12-13 DIAGNOSIS — Z510 Encounter for antineoplastic radiation therapy: Secondary | ICD-10-CM | POA: Diagnosis not present

## 2019-12-13 DIAGNOSIS — Z170 Estrogen receptor positive status [ER+]: Secondary | ICD-10-CM | POA: Diagnosis not present

## 2019-12-13 DIAGNOSIS — C50411 Malignant neoplasm of upper-outer quadrant of right female breast: Secondary | ICD-10-CM | POA: Diagnosis not present

## 2019-12-14 DIAGNOSIS — Z170 Estrogen receptor positive status [ER+]: Secondary | ICD-10-CM | POA: Diagnosis not present

## 2019-12-14 DIAGNOSIS — Z510 Encounter for antineoplastic radiation therapy: Secondary | ICD-10-CM | POA: Diagnosis not present

## 2019-12-14 DIAGNOSIS — C50411 Malignant neoplasm of upper-outer quadrant of right female breast: Secondary | ICD-10-CM | POA: Diagnosis not present

## 2019-12-14 NOTE — Progress Notes (Signed)
[place]   [phone] [phone]   Clinic Follow up Note   Patient Care Team: Patient, [name] as PCP - General ([place]) Minus Breeding, MD as PCP - Cardiology (Cardiology) Armbruster, Carlota Raspberry, MD as Consulting Physician (Gastroenterology) Eyvonne Mechanic as Counselor (Licensed Clinical Social Worker) Truitt Merle, MD as Consulting Physician (Hematology) Alla Feeling, NP as Nurse Practitioner (Nurse Practitioner) Mauro Kaufmann, RN as Oncology Nurse Navigator Rockwell Germany, RN as Oncology Nurse Navigator Rolm Bookbinder, MD as Consulting Physician (General Surgery)  Date of Service:  12/15/2019  CHIEF COMPLAINT: F/u ofRight breast cancer  SUMMARY OF ONCOLOGIC HISTORY: Oncology History Overview Note  Cancer Staging Malignant neoplasm of upper-outer quadrant of right breast in female, estrogen receptor positive ([place]) Staging form: Breast, AJCC 8th Edition - Clinical stage from 06/01/2019: Stage IA (cT1c, cN0, cM0, G3, ER+, PR-, HER2+) - Signed by Truitt Merle, MD on 06/17/2019 - Pathologic stage from 06/30/2019: Stage IA (pT1c, pN0, cM0, G3, ER+, PR-, HER2+) - Signed by Truitt Merle, MD on 07/15/2019    Malignant neoplasm of upper-outer quadrant of right breast in female, estrogen receptor positive ([place])  10/2018 -  Anti-estrogen oral therapy   She was preventively started on Tamoxifen since 10/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 Held after 06/17/19 to proceed with her breast cancer surgery and chemo.    05/06/2019 Breast MRI   IMPRESSION Enhancing right breast mass suspicious for malignancy. Second look [place] if recommended.  No Suspicious enhancing left breast masses.    05/24/2019 Breast US   IMPRESSION 1.8cm irregular right breast mass within the 9:30 position 3.5cm from the nipple is highly suggestive of malignancy. An Ultrasound guided breast biopsy is recommended. The findings of the study and recommendation for biopsy were  discussed with the patient directly.    06/01/2019 Cancer Staging   Staging form: Breast, AJCC 8th Edition - Clinical stage from 06/01/2019: Stage IA (cT1c, cN0, cM0, G3, ER+, PR-, HER2+) - Signed by Truitt Merle, MD on 06/17/2019   06/01/2019 Initial Biopsy   Breast Biopsy - Right Breast Core Bx DIAGNOSIS INFILTRATING DUCT CARCINOMA WITH MINOR DUCTAL CARCINOMA IN SITU COMPNENT OF RIGHT BREAST, CORE NEEDLE BIOPSY    06/01/2019 Receptors her2   ER: greater than 75% of tumor cells show moderate staining  PR: Negative  HER2: Positive 3+ Ki67: 40%   06/17/2019 Initial Diagnosis   Malignant neoplasm of upper-outer quadrant of right breast in female, estrogen receptor positive ([place])   06/30/2019 Surgery   RIGHT BREAST LUMPECTOMY WITH RADIOACTIVE SEED AND RIGHT AXILLARY SENTINEL NODE BIOPSY and PAC placement  by Dr Donne Hazel     06/30/2019 Pathology Results   FINAL MICROSCOPIC DIAGNOSIS:   A. BREAST, RIGHT, LUMPECTOMY:  - Invasive ductal carcinoma, grade 3, spanning 1.5 cm.  - Intermediate grade ductal carcinoma in situ.  - Invasive carcinoma is 0.1-0.2 from the final lateral margin (part E).  - Margins are negative for in situ carcinoma.  - Biopsy site.  - See oncology table.   B. LYMPH NODE, RIGHT AXILLARY, SENTINEL, BIOPSY:  - One of one lymph nodes negative for carcinoma (0/1).   C. LYMPH NODE, RIGHT AXILLARY, SENTINEL, BIOPSY:  - One of one lymph nodes negative for carcinoma (0/1).   D. LYMPH NODE, RIGHT AXILLARY, SENTINEL, BIOPSY:  - One of one lymph nodes negative for carcinoma (0/1).   E. BREAST, RIGHT ADDITIONAL LATERAL MARGIN, EXCISION:  - Invasive ductal carcinoma.  - Invasive carcinoma is  0.1-0.2 cm from the new lateral margin.   F. BREAST, RIGHT ADDITIONAL POSTERIOR MARGIN, EXCISION:  - Fibrocystic change and usual ductal hyperplasia.  - No malignancy identified.   G. BREAST, RIGHT ADDITIONAL SUPERIOR MARGIN, EXCISION:  - Fibrocystic change and usual ductal  hyperplasia.  - Incidental radial scar.  - No malignancy identified.     PROGNOSTIC INDICATOR RESULTS:  The tumor cells are POSITIVE for Her2 (3+).  Estrogen Receptor:       POSITIVE, 85%, MODERATE STAINING  Progesterone Receptor:   NEGATIVE  Proliferation Marker Ki-67:   25%    06/30/2019 Cancer Staging   Staging form: Breast, AJCC 8th Edition - Pathologic stage from 06/30/2019: Stage IA (pT1c, pN0, cM0, G3, ER+, PR-, HER2+) - Signed by Truitt Merle, MD on 07/15/2019   07/29/2019 -  Chemotherapy   Adjuvant Weekly Abraxane and Transtuzumab for 12 weeks starting 07/29/19-10/12/19, followed by maintenance Herceptin q3weeks starting 11/03/19 to complete 1 year of treatment. Due to headaches her Herceptin (Ogivr) was switched to Herceptin Calla Kicks) on 09/08/19. Due to her insurance denial of Abraxane, I changed it to paclitaxel starting 09/15/19.      CURRENT THERAPY:  -Maintenance Herceptin (Kanjinti) q3weeksstarting 9/30/21to complete 1 year of treatment from 07/29/19.  INTERVAL HISTORY:  Abigail Berg is here for a follow up and treatment. She presents to the clinic with her friend.  She started radiation last week, has been tolerating well so far.  Her hair is growing back, she is recovering well from chemotherapy.  No other new complaints. All other systems were reviewed with the patient and are negative.  MEDICAL HISTORY:  Past Medical History:  Diagnosis Date  . Depression   . Family history of colon cancer   . Monoallelic mutation of CHEK2 gene in female patient   . Personal history of colonic polyps   . S/P partial colectomy     SURGICAL HISTORY: Past Surgical History:  Procedure Laterality Date  . BREAST LUMPECTOMY WITH RADIOACTIVE SEED AND SENTINEL LYMPH NODE BIOPSY Right 06/30/2019   Procedure: RIGHT BREAST LUMPECTOMY WITH RADIOACTIVE SEED AND RIGHT AXILLARY SENTINEL NODE BIOPSY;  Surgeon: Rolm Bookbinder, MD;  Location: [place] [facility];  Service: General;   Laterality: Right;  PEC BLOCK  . BREAST SURGERY    . COLON SURGERY     Partial colectomy  . COLONOSCOPY    . POLYPECTOMY    . PORTACATH PLACEMENT Left 06/30/2019   Procedure: INSERTION PORT-A-CATH WITH ULTRASOUND GUIDANCE;  Surgeon: Rolm Bookbinder, MD;  Location: [place];  Service: General;  Laterality: Left;    I have reviewed the social history and family history with the patient and they are unchanged from previous note.  ALLERGIES:  has No Known Allergies.  MEDICATIONS:  Current Outpatient Medications  Medication Sig Dispense Refill  . acetaminophen (TYLENOL) 325 MG tablet Take 650 mg by mouth every 6 (six) hours as needed for headache.    . escitalopram (LEXAPRO) 20 MG tablet Take 20 mg by mouth daily.     Marland Kitchen gabapentin (NEURONTIN) 300 MG capsule Take 1 capsule (300 mg total) by mouth 3 (three) times daily. 90 capsule 3  . ibuprofen (ADVIL) 200 MG tablet Take 200 mg by mouth every 6 (six) hours as needed for headache or mild pain.    Marland Kitchen lidocaine-prilocaine (EMLA) cream Apply to affected area once 30 g 3  . tamoxifen (NOLVADEX) 20 MG tablet Take 1 tablet (20 mg total) by mouth daily. 30 tablet 2  No current facility-administered medications for this visit.    PHYSICAL EXAMINATION: ECOG PERFORMANCE STATUS: 0 - Asymptomatic  Vitals:   12/15/19 1256  BP: 130/69  Pulse: 82  Resp: 17  Temp: (!) 97.2 F (36.2 C)  SpO2: 100%   Filed Weights   12/15/19 1256  Weight: 193 lb 4.8 oz (87.7 kg)    GENERAL:alert, no distress and comfortable SKIN: skin color, texture, turgor are normal, no rashes or significant lesions EYES: normal, Conjunctiva are pink and non-injected, sclera clear NECK: supple, thyroid normal size, non-tender, without nodularity Musculoskeletal:no cyanosis of digits and no clubbing  NEURO: alert & oriented x 3 with fluent speech, no focal motor/sensory deficits  LABORATORY DATA:  I have reviewed the data as listed CBC Latest Ref Rng &  Units 12/15/2019 10/12/2019 10/06/2019  WBC 4.0 - 10.5 K/uL 5.6 6.0 6.5  Hemoglobin 12.0 - 15.0 g/dL 14.2 13.2 13.3  Hematocrit 36 - 46 % 41.1 38.1 38.2  Platelets 150 - 400 K/uL 238 287 286     CMP Latest Ref Rng & Units 12/15/2019 10/25/2019 10/12/2019  Glucose 70 - 99 mg/dL 112(H) 98 98  BUN 6 - 20 mg/dL $Remove'13 14 13  'GHWjjpy$ Creatinine 0.44 - 1.00 mg/dL 0.84 0.83 0.76  Sodium 135 - 145 mmol/L 139 142 139  Potassium 3.5 - 5.1 mmol/L 3.8 4.5 4.1  Chloride 98 - 111 mmol/L 107 106 108  CO2 22 - 32 mmol/L $RemoveB'26 27 26  'hTPJBRVV$ Calcium 8.9 - 10.3 mg/dL 10.9(H) 11.4(H) 10.9(H)  Total Protein 6.5 - 8.1 g/dL 6.8 - 6.7  Total Bilirubin 0.3 - 1.2 mg/dL 0.4 - 0.3  Alkaline Phos 38 - 126 U/L 82 - 73  AST 15 - 41 U/L 16 - 16  ALT 0 - 44 U/L 10 - 18      RADIOGRAPHIC STUDIES: I have personally reviewed the radiological images as listed and agreed with the findings in the report. No results found.   ASSESSMENT & PLAN:  Abigail Berg is a 54 y.o. female with    1.Malignant neoplasm of upper-outer quadrant of right breast,Stage1A,p(T1cN0M0),stage IA,ER+/PR-/HER2+, Grade3. -She was diagnosed in 05/2019.She is s/pright breast lumpectomy.  -Given her high risk HER2 positive breast cancer,she completed chemotherapy withweeklyAbraxane and Herceptin6/25/21-10/12/19. She started maintenance Hereptin (Kanjinti) on 11/03/19 to complete 1 year of therapy.   -She has started adjuvant radiation to reduce her local risk of recurrence with Local Rad onc, she is tolerating well so far. -Her outside Mammogram showed a 1 cm mass in the upper outer quadrant of the right breast, 11 cm from nipple, ultrasound showed likely benign lesion.  Repeated mammogram in 6 months was recommended.  I will request the ultrasound report -Continue trastuzumab every 3 weeks until June 2022, with lab every 9 weeks -I recommend her to restart tamoxifen 2 to 3 weeks after she completes radiation.  She was previously on tamoxifen before cancer diagnosis  and tolerated well.  I again reviewed potential side effects, and recommended her to stay active to reduce her risk of thrombosis. -Her last menstrual period was early 2021.  She is still premenopausal.  We discussed switching to tamoxifen therapy if she became postmenopausal in the next few years.    2. 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 -She will f/u with Dr. Havery Moros for screening colonoscopies, last done 07/2018 with12polyps. I recommend to repeat in  next 2 years.  -Shewaspreviouslyonchemoprevention withTamoxifen 10/2018-06/17/19, plan to restart after she completes adjuvant radiation.  3.She has been counseled onSmoking Cessation. Currently smoking 1/2 ppd.  4. S/p ileocecectomy on 08/14/15 due to benign colon polyp invading appendix, history of recurrent colon polyps  5.H/o right breast fibroadenoma,S/p rightsurgical resection on 11/22/15  6. Hypercalcemia, likelyprimary hyperparathyroidism -She is not on Calcium or Vit D. Ipreviouslyrecommendshetake a multivitamin which contains low-dose calcium -Her PTH was within normal limits, but on the high end. PTHrp was negative. This is likelyprimary hyperparathyroidism. -She was referred to endocrine, seen on 10/25/19 -She is scheduled to see general surgery next week, to discuss parathyroid gland resection and hernia repair   PLAN: -We will request outside breast and axillary ultrasound report -Lab reviewed, will proceed trastuzumab infusion today, and continue every 3 weeks -Lab, flush and follow-up in 9 weeks -She will restart tamoxifen in mid December, I refilled today  -We will schedule her first Covid vaccine in our clinic in 3 weeks   No problem-specific Assessment & Plan notes found for this encounter.   No orders of the defined types  were placed in this encounter.  All questions were answered. The patient knows to call the clinic with any problems, questions or concerns. No barriers to learning was detected. The total time spent in the appointment was 30 minutes.     Truitt Merle, MD 12/15/2019   I, Joslyn Devon, am acting as scribe for Truitt Merle, MD.   I have reviewed the above documentation for accuracy and completeness, and I agree with the above.

## 2019-12-15 ENCOUNTER — Encounter: Payer: Self-pay | Admitting: Hematology

## 2019-12-15 ENCOUNTER — Other Ambulatory Visit: Payer: BC Managed Care – PPO

## 2019-12-15 ENCOUNTER — Inpatient Hospital Stay (HOSPITAL_BASED_OUTPATIENT_CLINIC_OR_DEPARTMENT_OTHER): Payer: BC Managed Care – PPO | Admitting: Hematology

## 2019-12-15 ENCOUNTER — Inpatient Hospital Stay: Payer: BC Managed Care – PPO | Attending: Hematology

## 2019-12-15 ENCOUNTER — Inpatient Hospital Stay: Payer: BC Managed Care – PPO

## 2019-12-15 ENCOUNTER — Other Ambulatory Visit: Payer: Self-pay

## 2019-12-15 ENCOUNTER — Ambulatory Visit: Payer: BC Managed Care – PPO

## 2019-12-15 ENCOUNTER — Ambulatory Visit: Payer: BC Managed Care – PPO | Admitting: Hematology

## 2019-12-15 ENCOUNTER — Encounter: Payer: Self-pay | Admitting: *Deleted

## 2019-12-15 VITALS — BP 130/69 | HR 82 | Temp 97.2°F | Resp 17 | Ht 68.0 in | Wt 193.3 lb

## 2019-12-15 DIAGNOSIS — E210 Primary hyperparathyroidism: Secondary | ICD-10-CM | POA: Diagnosis not present

## 2019-12-15 DIAGNOSIS — Z170 Estrogen receptor positive status [ER+]: Secondary | ICD-10-CM

## 2019-12-15 DIAGNOSIS — Z95828 Presence of other vascular implants and grafts: Secondary | ICD-10-CM

## 2019-12-15 DIAGNOSIS — Z1501 Genetic susceptibility to malignant neoplasm of breast: Secondary | ICD-10-CM

## 2019-12-15 DIAGNOSIS — Z1509 Genetic susceptibility to other malignant neoplasm: Secondary | ICD-10-CM

## 2019-12-15 DIAGNOSIS — Z5112 Encounter for antineoplastic immunotherapy: Secondary | ICD-10-CM | POA: Diagnosis not present

## 2019-12-15 DIAGNOSIS — C50411 Malignant neoplasm of upper-outer quadrant of right female breast: Secondary | ICD-10-CM | POA: Diagnosis not present

## 2019-12-15 DIAGNOSIS — K439 Ventral hernia without obstruction or gangrene: Secondary | ICD-10-CM | POA: Diagnosis not present

## 2019-12-15 DIAGNOSIS — Z510 Encounter for antineoplastic radiation therapy: Secondary | ICD-10-CM | POA: Diagnosis not present

## 2019-12-15 LAB — CMP (CANCER CENTER ONLY)
ALT: 10 U/L (ref 0–44)
AST: 16 U/L (ref 15–41)
Albumin: 3.7 g/dL (ref 3.5–5.0)
Alkaline Phosphatase: 82 U/L (ref 38–126)
Anion gap: 6 (ref 5–15)
BUN: 13 mg/dL (ref 6–20)
CO2: 26 mmol/L (ref 22–32)
Calcium: 10.9 mg/dL — ABNORMAL HIGH (ref 8.9–10.3)
Chloride: 107 mmol/L (ref 98–111)
Creatinine: 0.84 mg/dL (ref 0.44–1.00)
GFR, Estimated: >60 mL/min (ref >60)
Glucose, Bld: 112 mg/dL — ABNORMAL HIGH (ref 70–99)
Potassium: 3.8 mmol/L (ref 3.5–5.1)
Sodium: 139 mmol/L (ref 135–145)
Total Bilirubin: 0.4 mg/dL (ref 0.3–1.2)
Total Protein: 6.8 g/dL (ref 6.5–8.1)

## 2019-12-15 LAB — CBC WITH DIFFERENTIAL (CANCER CENTER ONLY)
Abs Immature Granulocytes: 0.02 10*3/uL (ref 0.00–0.07)
Basophils Absolute: 0 10*3/uL (ref 0.0–0.1)
Basophils Relative: 1 %
Eosinophils Absolute: 0.2 10*3/uL (ref 0.0–0.5)
Eosinophils Relative: 3 %
HCT: 41.1 % (ref 36.0–46.0)
Hemoglobin: 14.2 g/dL (ref 12.0–15.0)
Immature Granulocytes: 0 %
Lymphocytes Relative: 32 %
Lymphs Abs: 1.8 10*3/uL (ref 0.7–4.0)
MCH: 32.6 pg (ref 26.0–34.0)
MCHC: 34.5 g/dL (ref 30.0–36.0)
MCV: 94.3 fL (ref 80.0–100.0)
Monocytes Absolute: 0.5 10*3/uL (ref 0.1–1.0)
Monocytes Relative: 9 %
Neutro Abs: 3.1 10*3/uL (ref 1.7–7.7)
Neutrophils Relative %: 55 %
Platelet Count: 238 10*3/uL (ref 150–400)
RBC: 4.36 MIL/uL (ref 3.87–5.11)
RDW: 12.6 % (ref 11.5–15.5)
WBC Count: 5.6 10*3/uL (ref 4.0–10.5)
nRBC: 0 % (ref 0.0–0.2)

## 2019-12-15 MED ORDER — SODIUM CHLORIDE 0.9% FLUSH
10.0000 mL | INTRAVENOUS | Status: DC | PRN
  Administered 2019-12-15: 10 mL
  Filled 2019-12-15: qty 10

## 2019-12-15 MED ORDER — SODIUM CHLORIDE 0.9 % IV SOLN
Freq: Once | INTRAVENOUS | Status: AC
  Filled 2019-12-15: qty 250

## 2019-12-15 MED ORDER — DIPHENHYDRAMINE HCL 25 MG PO CAPS
ORAL_CAPSULE | ORAL | Status: AC
  Filled 2019-12-15: qty 2

## 2019-12-15 MED ORDER — TRASTUZUMAB-ANNS CHEMO 150 MG IV SOLR
6.0000 mg/kg | Freq: Once | INTRAVENOUS | Status: AC
  Administered 2019-12-15: 504 mg via INTRAVENOUS
  Filled 2019-12-15: qty 24

## 2019-12-15 MED ORDER — ACETAMINOPHEN 325 MG PO TABS
650.0000 mg | ORAL_TABLET | Freq: Once | ORAL | Status: AC
  Administered 2019-12-15: 650 mg via ORAL

## 2019-12-15 MED ORDER — ACETAMINOPHEN 325 MG PO TABS
ORAL_TABLET | ORAL | Status: AC
  Filled 2019-12-15: qty 2

## 2019-12-15 MED ORDER — HEPARIN SOD (PORK) LOCK FLUSH 100 UNIT/ML IV SOLN
500.0000 [IU] | Freq: Once | INTRAVENOUS | Status: AC | PRN
  Administered 2019-12-15: 500 [IU]
  Filled 2019-12-15: qty 5

## 2019-12-15 MED ORDER — TAMOXIFEN CITRATE 20 MG PO TABS: 20.0000 mg | ORAL_TABLET | Freq: Every day | ORAL | 2 refills | Status: DC

## 2019-12-15 MED ORDER — SODIUM CHLORIDE 0.9% FLUSH
10.0000 mL | Freq: Once | INTRAVENOUS | Status: AC
  Administered 2019-12-15: 10 mL
  Filled 2019-12-15: qty 10

## 2019-12-15 MED ORDER — DIPHENHYDRAMINE HCL 25 MG PO CAPS
50.0000 mg | ORAL_CAPSULE | Freq: Once | ORAL | Status: AC
  Administered 2019-12-15: 50 mg via ORAL

## 2019-12-16 ENCOUNTER — Telehealth: Payer: Self-pay | Admitting: Hematology

## 2019-12-16 ENCOUNTER — Ambulatory Visit: Payer: BC Managed Care – PPO | Admitting: Internal Medicine

## 2019-12-16 DIAGNOSIS — Z510 Encounter for antineoplastic radiation therapy: Secondary | ICD-10-CM | POA: Diagnosis not present

## 2019-12-16 DIAGNOSIS — C50411 Malignant neoplasm of upper-outer quadrant of right female breast: Secondary | ICD-10-CM | POA: Diagnosis not present

## 2019-12-16 DIAGNOSIS — Z170 Estrogen receptor positive status [ER+]: Secondary | ICD-10-CM | POA: Diagnosis not present

## 2019-12-16 NOTE — Telephone Encounter (Signed)
Scheduled per 11/11 los. Pt is aware of appt times and dates.

## 2019-12-19 ENCOUNTER — Other Ambulatory Visit: Payer: Self-pay | Admitting: Surgery

## 2019-12-19 DIAGNOSIS — E210 Primary hyperparathyroidism: Secondary | ICD-10-CM

## 2019-12-19 DIAGNOSIS — Z510 Encounter for antineoplastic radiation therapy: Secondary | ICD-10-CM | POA: Diagnosis not present

## 2019-12-19 DIAGNOSIS — C50411 Malignant neoplasm of upper-outer quadrant of right female breast: Secondary | ICD-10-CM | POA: Diagnosis not present

## 2019-12-19 DIAGNOSIS — Z170 Estrogen receptor positive status [ER+]: Secondary | ICD-10-CM | POA: Diagnosis not present

## 2019-12-20 ENCOUNTER — Encounter: Payer: Self-pay | Admitting: *Deleted

## 2019-12-20 DIAGNOSIS — Z170 Estrogen receptor positive status [ER+]: Secondary | ICD-10-CM | POA: Diagnosis not present

## 2019-12-20 DIAGNOSIS — C50411 Malignant neoplasm of upper-outer quadrant of right female breast: Secondary | ICD-10-CM | POA: Diagnosis not present

## 2019-12-20 DIAGNOSIS — Z510 Encounter for antineoplastic radiation therapy: Secondary | ICD-10-CM | POA: Diagnosis not present

## 2019-12-21 DIAGNOSIS — Z510 Encounter for antineoplastic radiation therapy: Secondary | ICD-10-CM | POA: Diagnosis not present

## 2019-12-21 DIAGNOSIS — C50411 Malignant neoplasm of upper-outer quadrant of right female breast: Secondary | ICD-10-CM | POA: Diagnosis not present

## 2019-12-21 DIAGNOSIS — Z170 Estrogen receptor positive status [ER+]: Secondary | ICD-10-CM | POA: Diagnosis not present

## 2019-12-22 ENCOUNTER — Encounter: Payer: Self-pay | Admitting: Internal Medicine

## 2019-12-22 ENCOUNTER — Telehealth: Payer: Self-pay | Admitting: Internal Medicine

## 2019-12-22 DIAGNOSIS — C50411 Malignant neoplasm of upper-outer quadrant of right female breast: Secondary | ICD-10-CM | POA: Diagnosis not present

## 2019-12-22 DIAGNOSIS — Z510 Encounter for antineoplastic radiation therapy: Secondary | ICD-10-CM | POA: Diagnosis not present

## 2019-12-22 DIAGNOSIS — Z170 Estrogen receptor positive status [ER+]: Secondary | ICD-10-CM | POA: Diagnosis not present

## 2019-12-22 NOTE — Telephone Encounter (Signed)
Please fax her bone density to [facility] . Thanks

## 2019-12-22 NOTE — Telephone Encounter (Signed)
Faxed as requested

## 2019-12-22 NOTE — Telephone Encounter (Signed)
Received bone density results as below     T Score Z Score   Left 1/3 radius  -0.5 +0.5  RFN -1.7 -0.6  Right total hip -1.1 -0.4  LFN -1.4 -0.3  Left Total Hip -0.6 +0.1  AP spine  + 0.2 + 1.3     Low bone density    Abby Nena Jordan, MD  [facility] Endocrinology  [facility] [address] [place], [place] [address] Phone: [phone] FAX: [phone]

## 2019-12-23 ENCOUNTER — Other Ambulatory Visit: Payer: Self-pay

## 2019-12-23 DIAGNOSIS — Z510 Encounter for antineoplastic radiation therapy: Secondary | ICD-10-CM | POA: Diagnosis not present

## 2019-12-23 DIAGNOSIS — C50411 Malignant neoplasm of upper-outer quadrant of right female breast: Secondary | ICD-10-CM

## 2019-12-23 DIAGNOSIS — Z170 Estrogen receptor positive status [ER+]: Secondary | ICD-10-CM | POA: Diagnosis not present

## 2019-12-25 DIAGNOSIS — C50411 Malignant neoplasm of upper-outer quadrant of right female breast: Secondary | ICD-10-CM | POA: Diagnosis not present

## 2019-12-25 DIAGNOSIS — Z510 Encounter for antineoplastic radiation therapy: Secondary | ICD-10-CM | POA: Diagnosis not present

## 2019-12-25 DIAGNOSIS — Z170 Estrogen receptor positive status [ER+]: Secondary | ICD-10-CM | POA: Diagnosis not present

## 2019-12-26 ENCOUNTER — Ambulatory Visit: Payer: BC Managed Care – PPO | Admitting: Internal Medicine

## 2019-12-26 DIAGNOSIS — Z170 Estrogen receptor positive status [ER+]: Secondary | ICD-10-CM | POA: Diagnosis not present

## 2019-12-26 DIAGNOSIS — Z510 Encounter for antineoplastic radiation therapy: Secondary | ICD-10-CM | POA: Diagnosis not present

## 2019-12-26 DIAGNOSIS — C50411 Malignant neoplasm of upper-outer quadrant of right female breast: Secondary | ICD-10-CM | POA: Diagnosis not present

## 2019-12-27 DIAGNOSIS — Z510 Encounter for antineoplastic radiation therapy: Secondary | ICD-10-CM | POA: Diagnosis not present

## 2019-12-27 DIAGNOSIS — Z170 Estrogen receptor positive status [ER+]: Secondary | ICD-10-CM | POA: Diagnosis not present

## 2019-12-27 DIAGNOSIS — C50411 Malignant neoplasm of upper-outer quadrant of right female breast: Secondary | ICD-10-CM | POA: Diagnosis not present

## 2019-12-28 DIAGNOSIS — Z170 Estrogen receptor positive status [ER+]: Secondary | ICD-10-CM | POA: Diagnosis not present

## 2019-12-28 DIAGNOSIS — C50411 Malignant neoplasm of upper-outer quadrant of right female breast: Secondary | ICD-10-CM | POA: Diagnosis not present

## 2019-12-28 DIAGNOSIS — Z510 Encounter for antineoplastic radiation therapy: Secondary | ICD-10-CM | POA: Diagnosis not present

## 2019-12-31 ENCOUNTER — Other Ambulatory Visit (HOSPITAL_COMMUNITY): Payer: BC Managed Care – PPO

## 2019-12-31 ENCOUNTER — Ambulatory Visit (HOSPITAL_COMMUNITY): Payer: BC Managed Care – PPO

## 2020-01-02 ENCOUNTER — Ambulatory Visit: Payer: BC Managed Care – PPO | Admitting: Internal Medicine

## 2020-01-02 DIAGNOSIS — Z170 Estrogen receptor positive status [ER+]: Secondary | ICD-10-CM | POA: Diagnosis not present

## 2020-01-02 DIAGNOSIS — C50411 Malignant neoplasm of upper-outer quadrant of right female breast: Secondary | ICD-10-CM | POA: Diagnosis not present

## 2020-01-02 DIAGNOSIS — Z510 Encounter for antineoplastic radiation therapy: Secondary | ICD-10-CM | POA: Diagnosis not present

## 2020-01-03 ENCOUNTER — Encounter: Payer: Self-pay | Admitting: *Deleted

## 2020-01-03 DIAGNOSIS — Z510 Encounter for antineoplastic radiation therapy: Secondary | ICD-10-CM | POA: Diagnosis not present

## 2020-01-03 DIAGNOSIS — C50411 Malignant neoplasm of upper-outer quadrant of right female breast: Secondary | ICD-10-CM | POA: Diagnosis not present

## 2020-01-03 DIAGNOSIS — Z170 Estrogen receptor positive status [ER+]: Secondary | ICD-10-CM | POA: Diagnosis not present

## 2020-01-04 ENCOUNTER — Encounter (HOSPITAL_COMMUNITY)
Admission: RE | Admit: 2020-01-04 | Discharge: 2020-01-04 | Disposition: A | Discharge status: Home / Self Care | Payer: BC Managed Care – PPO | Source: Ambulatory Visit | Attending: Surgery | Admitting: Surgery

## 2020-01-04 ENCOUNTER — Other Ambulatory Visit: Payer: Self-pay

## 2020-01-04 DIAGNOSIS — D351 Benign neoplasm of parathyroid gland: Secondary | ICD-10-CM | POA: Diagnosis not present

## 2020-01-04 DIAGNOSIS — E210 Primary hyperparathyroidism: Secondary | ICD-10-CM | POA: Insufficient documentation

## 2020-01-04 DIAGNOSIS — E213 Hyperparathyroidism, unspecified: Secondary | ICD-10-CM | POA: Diagnosis not present

## 2020-01-04 IMAGING — NM NM PARATHYROID W/ SPECT
5 series · 20 of 20 positions shown · non-contrast
Comparison: None

CLINICAL DATA: Primary hyperparathyroidism. History breast cancer,
currently receiving radiation treatments, elevated calcium and
parathormone

EXAM:
NM PARATHYROID SCINTIGRAPHY AND SPECT IMAGING
TECHNIQUE: Following intravenous administration of radiopharmaceutical, early
and 2-hour delayed planar images were obtained in the anterior
projection. Delayed triplanar SPECT images were also obtained at 2
hours.
RADIOPHARMACEUTICALS:  25.3 mCi [8D] Sestamibi IV

[Series 1: spect - (id)_(id)_cor · 4.1mm · 4.14mm/px · 6 of 128 frames shown]
[frame 11/128]
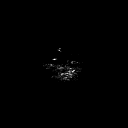
[frame 32/128]
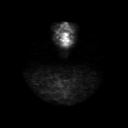
[frame 54/128]
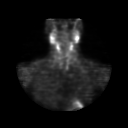
[frame 75/128]
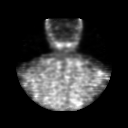
[frame 96/128]
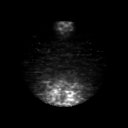
[frame 118/128]
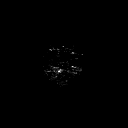

[Series 1: 15 min ant · 4.14mm/px · 1 of 1 slices shown]
[im 1/1]
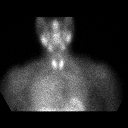

[Series 1: spect - (id)_(id)_tra · 4.1mm · 4.14mm/px · 6 of 128 frames shown]
[frame 11/128]
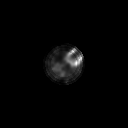
[frame 32/128]
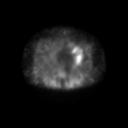
[frame 54/128]
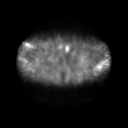
[frame 75/128]
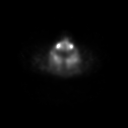
[frame 96/128]
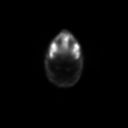
[frame 118/128]
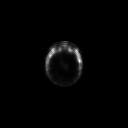

[Series 2: 2 hr ant · 4.14mm/px · 1 of 1 slices shown]
[im 1/1]
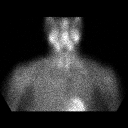

[Series 3: spect parathyroid · 4.14mm/px · 6 of 64 frames shown]
[frame 6/64  full-range]
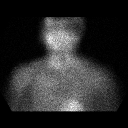
[frame 16/64  full-range]
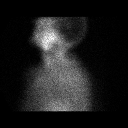
[frame 27/64  full-range]
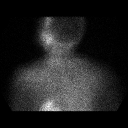
[frame 38/64  full-range]
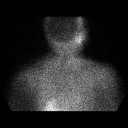
[frame 48/64  full-range]
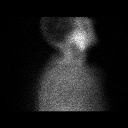
[frame 59/64  full-range]
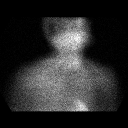

[20 of 20 positions shown; findings below may reference images not displayed]

FINDINGS: Planar imaging: Normal initial sestamibi distribution in both
thyroid lobes. Normal washout of tracer on delayed images. Focal
area of questionable abnormal sestamibi retention at the expected
position of the LEFT inferior parathyroid gland.

SPECT imaging: Focal abnormal sestamibi retention at the LEFT
inferior parathyroid gland consistent with parathyroid adenoma. No
abnormal sestamibi retention at the expected positions of the
remaining parathyroid glands. No ectopic localization of tracer in
the mediastinum.
IMPRESSION: Abnormal exam, positive for the presence of a LEFT inferior
parathyroid adenoma.

## 2020-01-04 IMAGING — US US THYROID
1 series · 14 of 25 positions shown · non-contrast
Comparison: None.

CLINICAL DATA: Hyperparathyroidism

EXAM:
THYROID ULTRASOUND
TECHNIQUE: Ultrasound examination of the thyroid gland and adjacent soft
tissues was performed.

[Series 1: us thyroid · 59 acquisitions, 14 frames shown]
[im 1/59]
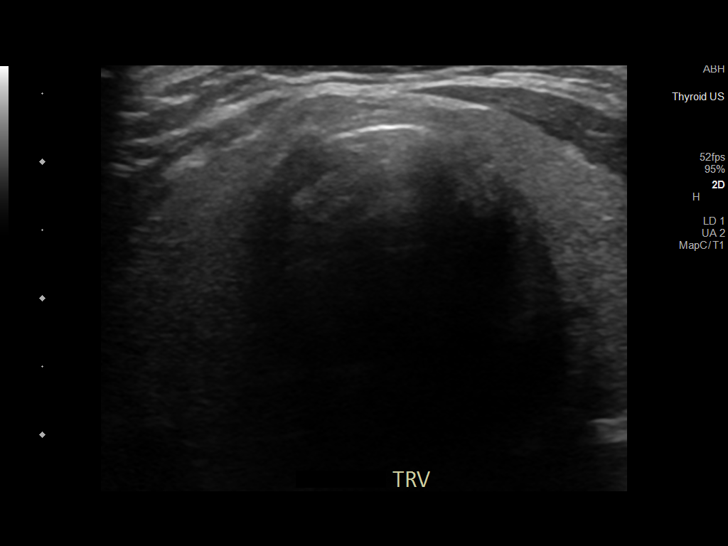
[im 5/59]
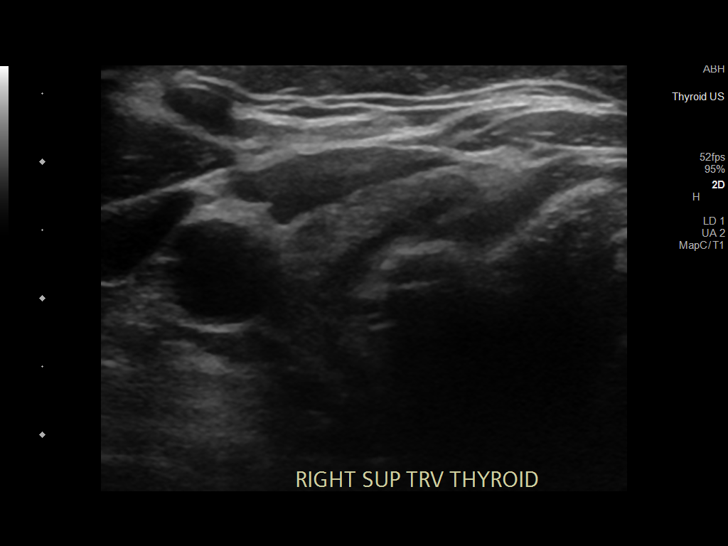
[im 10/59]
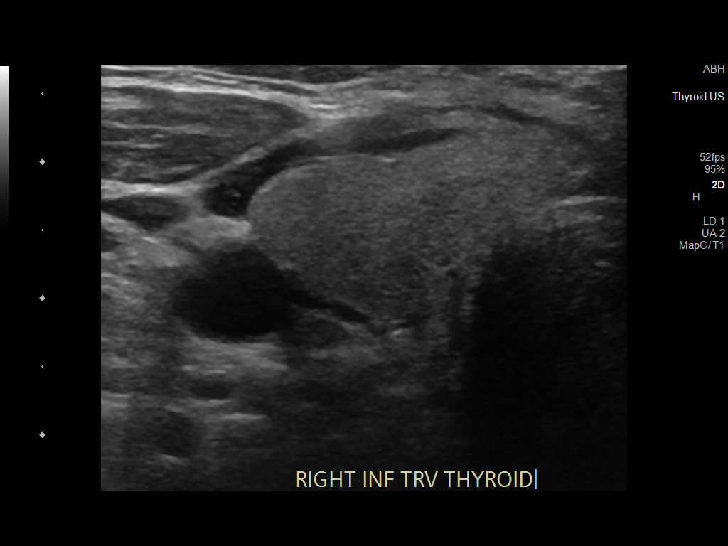
[im 15/59]
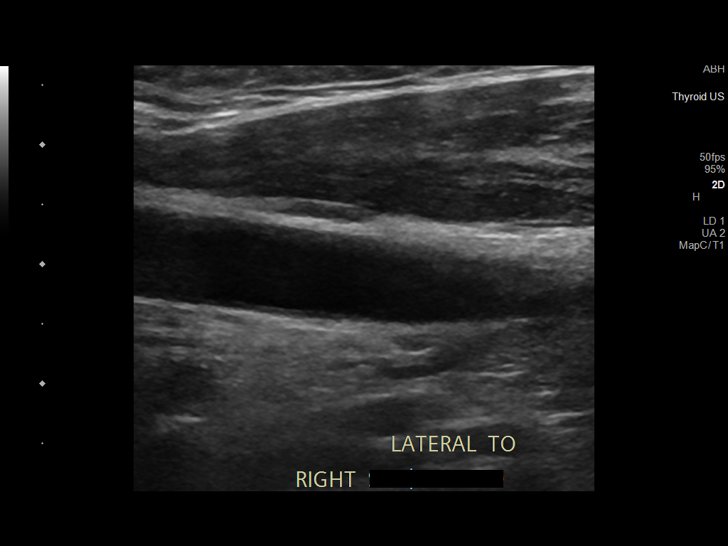
[im 20/59]
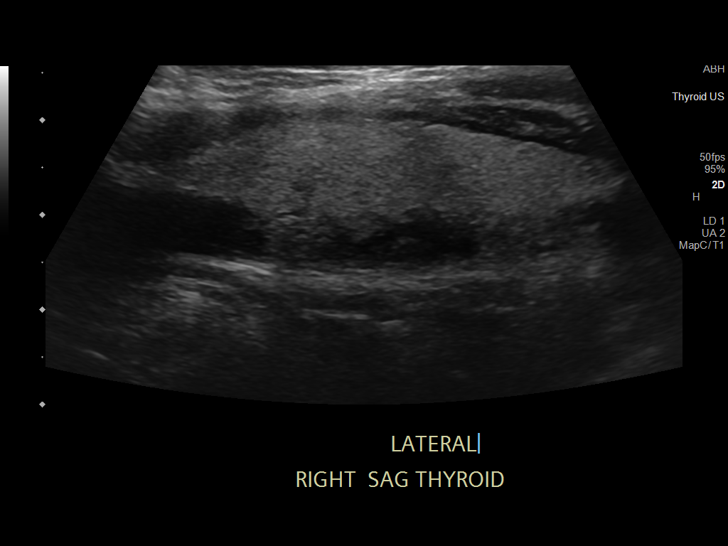
[im 22/59]
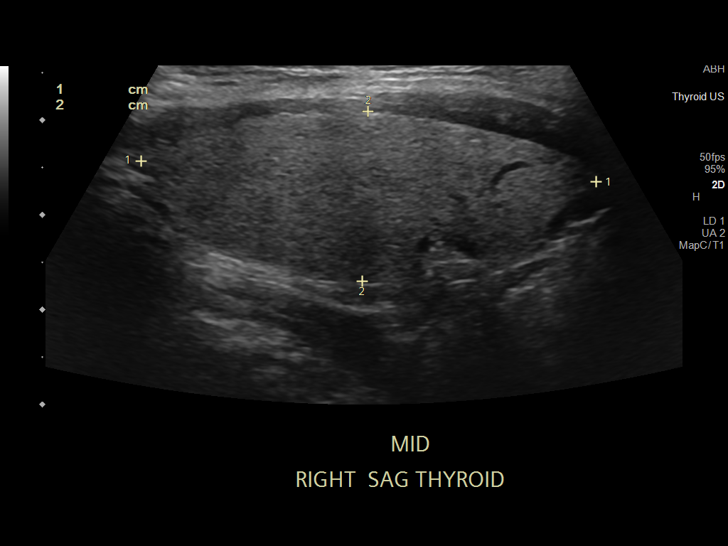
[im 27/59]
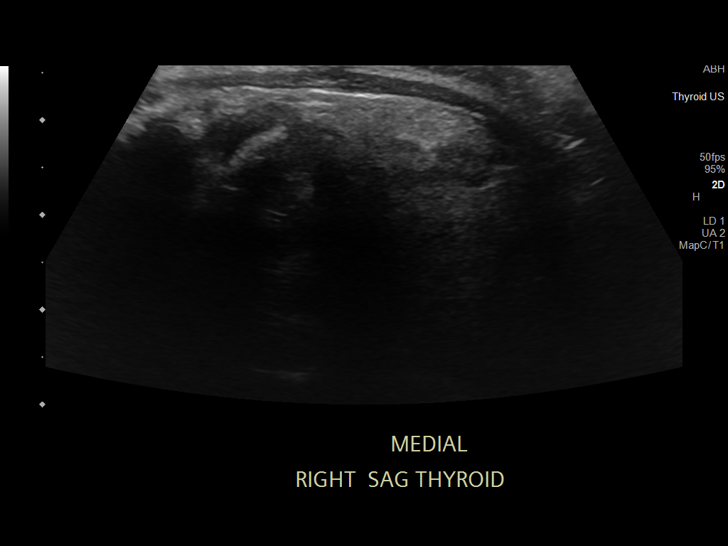
[im 32/59]
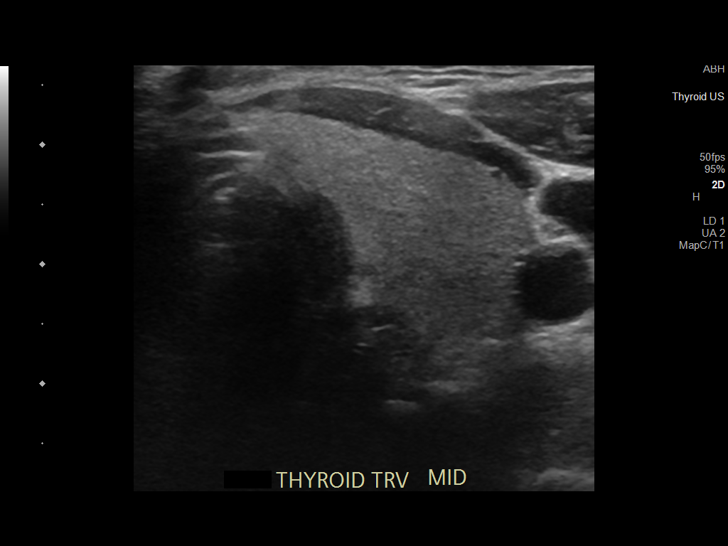
[im 37/59]
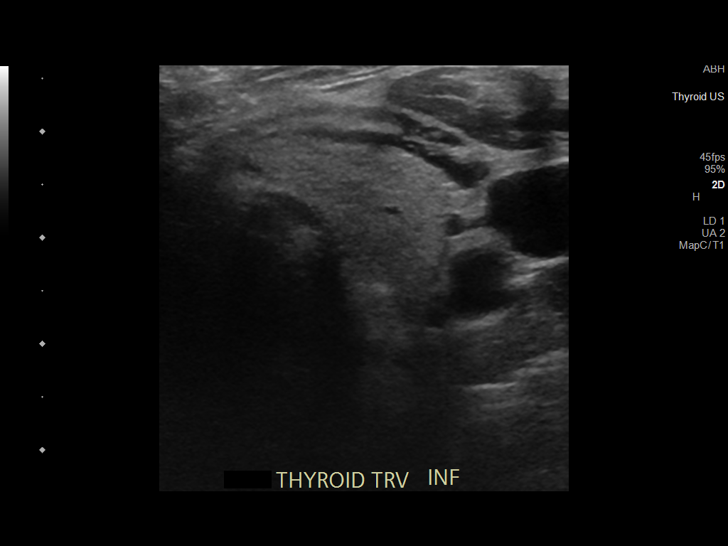
[im 39/59]
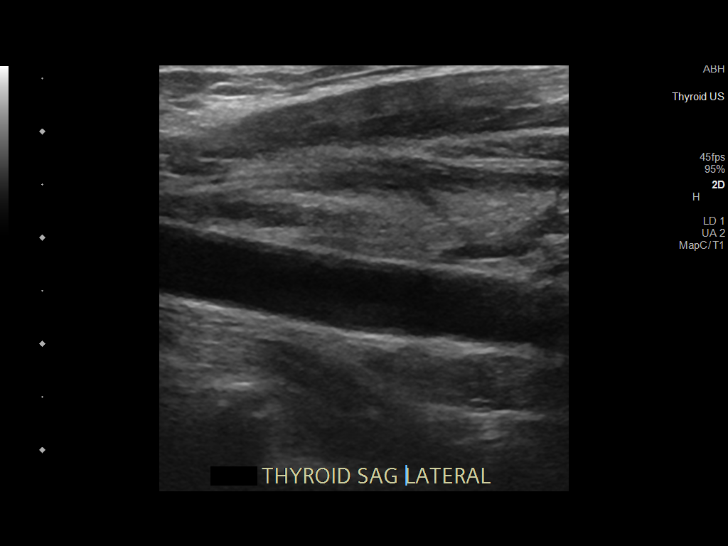
[im 44/59]
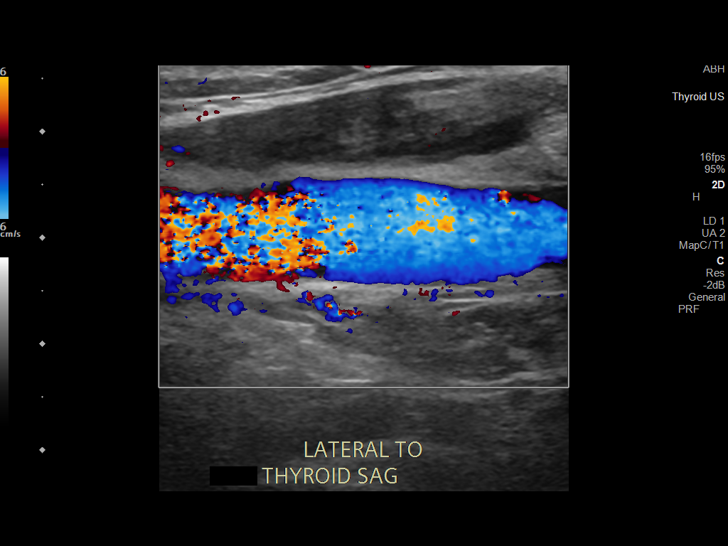
[im 49/59]
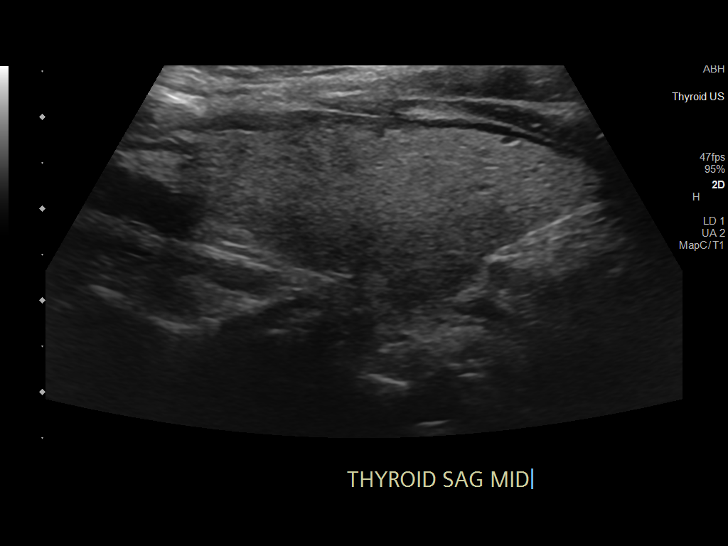
[im 54/59]
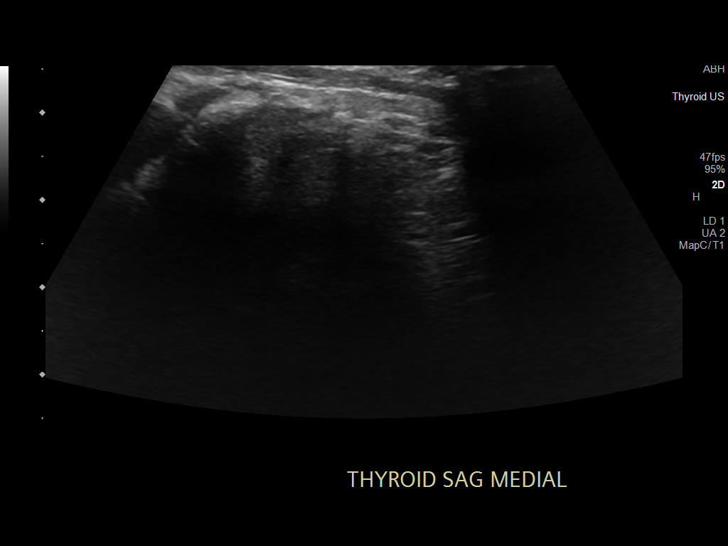
[im 59/59]
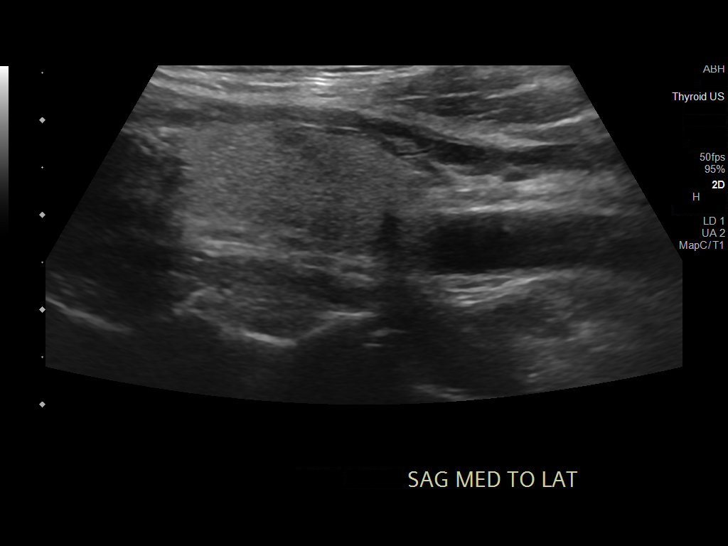

[14 of 25 positions shown; findings below may reference images not displayed]

FINDINGS: Parenchymal Echotexture: Normal

Isthmus: 2 mm

Right lobe: 4.8 x 1.8 x 1.8 cm

Left lobe: 4.6 x 1.9 x 1.5 cm

_________________________________________________________

Estimated total number of nodules >/= 1 cm: 0

Number of spongiform nodules >/=  2 cm not described below (TR1): 0

Number of mixed cystic and solid nodules >/= 1.5 cm not described
below (TR2): 0

_________________________________________________________

No discrete nodules are seen within the thyroid gland.

Normal thyroid echotexture and vascularity. No abnormal parathyroid
nodule or mass appreciated by ultrasound. No regional adenopathy.
IMPRESSION: Normal thyroid ultrasound for age

The above is in keeping with the ACR TI-RADS recommendations - [HOSPITAL] [GH];[DATE].

## 2020-01-04 MED ORDER — TECHNETIUM TC 99M SESTAMIBI GENERIC - CARDIOLITE
25.3000 | Freq: Once | INTRAVENOUS | Status: AC | PRN
  Administered 2020-01-04: 25.3 via INTRAVENOUS

## 2020-01-05 ENCOUNTER — Inpatient Hospital Stay: Payer: BC Managed Care – PPO

## 2020-01-05 ENCOUNTER — Inpatient Hospital Stay: Payer: BC Managed Care – PPO | Attending: Hematology

## 2020-01-05 ENCOUNTER — Other Ambulatory Visit: Payer: Self-pay

## 2020-01-05 VITALS — BP 118/54 | HR 74 | Temp 98.4°F | Resp 16

## 2020-01-05 DIAGNOSIS — Z23 Encounter for immunization: Secondary | ICD-10-CM

## 2020-01-05 DIAGNOSIS — Z5112 Encounter for antineoplastic immunotherapy: Secondary | ICD-10-CM | POA: Diagnosis not present

## 2020-01-05 DIAGNOSIS — C50411 Malignant neoplasm of upper-outer quadrant of right female breast: Secondary | ICD-10-CM | POA: Diagnosis not present

## 2020-01-05 DIAGNOSIS — Z170 Estrogen receptor positive status [ER+]: Secondary | ICD-10-CM

## 2020-01-05 MED ORDER — ACETAMINOPHEN 325 MG PO TABS
650.0000 mg | ORAL_TABLET | Freq: Once | ORAL | Status: AC
  Administered 2020-01-05: 650 mg via ORAL

## 2020-01-05 MED ORDER — ACETAMINOPHEN 325 MG PO TABS
ORAL_TABLET | ORAL | Status: AC
  Filled 2020-01-05: qty 2

## 2020-01-05 MED ORDER — HEPARIN SOD (PORK) LOCK FLUSH 100 UNIT/ML IV SOLN
500.0000 [IU] | Freq: Once | INTRAVENOUS | Status: AC | PRN
  Administered 2020-01-05: 500 [IU]
  Filled 2020-01-05: qty 5

## 2020-01-05 MED ORDER — DIPHENHYDRAMINE HCL 25 MG PO CAPS
ORAL_CAPSULE | ORAL | Status: AC
  Filled 2020-01-05: qty 2

## 2020-01-05 MED ORDER — TRASTUZUMAB-ANNS CHEMO 150 MG IV SOLR
6.0000 mg/kg | Freq: Once | INTRAVENOUS | Status: AC
  Administered 2020-01-05: 504 mg via INTRAVENOUS
  Filled 2020-01-05: qty 24

## 2020-01-05 MED ORDER — SODIUM CHLORIDE 0.9 % IV SOLN
Freq: Once | INTRAVENOUS | Status: AC
  Filled 2020-01-05: qty 250

## 2020-01-05 MED ORDER — DIPHENHYDRAMINE HCL 25 MG PO CAPS
50.0000 mg | ORAL_CAPSULE | Freq: Once | ORAL | Status: AC
  Administered 2020-01-05: 50 mg via ORAL

## 2020-01-05 MED ORDER — SODIUM CHLORIDE 0.9% FLUSH
10.0000 mL | INTRAVENOUS | Status: DC | PRN
  Administered 2020-01-05: 10 mL
  Filled 2020-01-05: qty 10

## 2020-01-05 NOTE — Progress Notes (Signed)
Pt. stable for discharge, left via ambulation, no respiratory distress noted. 

## 2020-01-05 NOTE — Patient Instructions (Signed)
° °  Covid-19 Vaccination Clinic  Name:  Abigail Berg    MRN: [id number] DOB: Dec 19, 1965  01/05/2020 Abigail Berg was observed post Covid-19 immunization for 44minutes without incident . She was provided with Vaccine Information Sheet and instruction to access the V-Safe system.   Abigail Berg was instructed to call 911 with any severe reactions post vaccine:  Difficulty breathing   Swelling of face and throat   A fast heartbeat   A bad rash all over body   Dizziness and weakness

## 2020-01-05 NOTE — Patient Instructions (Signed)
[facility] Discharge Instructions for Patients Receiving Chemotherapy  Today you received the following chemotherapy agents trastuzumab.  To help prevent nausea and vomiting after your treatment, we encourage you to take your nausea medication as directed.    If you develop nausea and vomiting that is not controlled by your nausea medication, call the clinic.   BELOW ARE SYMPTOMS THAT SHOULD BE REPORTED IMMEDIATELY:  *FEVER GREATER THAN 100.5 F  *CHILLS WITH OR WITHOUT FEVER  NAUSEA AND VOMITING THAT IS NOT CONTROLLED WITH YOUR NAUSEA MEDICATION  *UNUSUAL SHORTNESS OF BREATH  *UNUSUAL BRUISING OR BLEEDING  TENDERNESS IN MOUTH AND THROAT WITH OR WITHOUT PRESENCE OF ULCERS  *URINARY PROBLEMS  *BOWEL PROBLEMS  UNUSUAL RASH Items with * indicate a potential emergency and should be followed up as soon as possible.  Feel free to call the clinic should you have any questions or concerns. The clinic phone number is [phone].  Please show the CHEMO ALERT CARD at check-in to the Emergency Department and triage nurse.   

## 2020-01-10 ENCOUNTER — Telehealth: Payer: Self-pay

## 2020-01-10 NOTE — Telephone Encounter (Signed)
Breast u/s scheduled.  I called Ms Gras and reviewed appt date and time.  I also requested the images of the las breast u/s done at [facility]  She will get the images and bring disc to [place].

## 2020-01-16 ENCOUNTER — Other Ambulatory Visit: Payer: Self-pay

## 2020-01-16 ENCOUNTER — Telehealth: Payer: Self-pay

## 2020-01-16 ENCOUNTER — Ambulatory Visit: Payer: BC Managed Care – PPO | Attending: General Surgery

## 2020-01-16 DIAGNOSIS — Z170 Estrogen receptor positive status [ER+]: Secondary | ICD-10-CM | POA: Insufficient documentation

## 2020-01-16 DIAGNOSIS — C50411 Malignant neoplasm of upper-outer quadrant of right female breast: Secondary | ICD-10-CM | POA: Insufficient documentation

## 2020-01-16 NOTE — Telephone Encounter (Signed)
TC from Pt. Stating that she is having surgery on 12/22 to remove hernia and parathyroid, Pt concerned that she has infusion appointment on 12/23 per Dr Burr Medico Pt. To be scheduled for 12/20 or 12/27. Pt. Agreed to 12/20 at 230p in infusion. Also informed that she should receive her second Covid injection. Pt. Informed to talk to surgeon to see if its ok to receive Covid injection after surgery. Pt will return call.

## 2020-01-16 NOTE — Therapy (Addendum)
[place], [place], [address] Phone: [phone]   Fax:  [phone]  Physical Therapy Treatment  Patient Details  Name: Abigail Berg MRN: [id number] Date of Birth: 1965-12-24 Referring Provider (PT): Donne Hazel   Encounter Date: 01/16/2020   PT End of Session - 01/16/20 1530    Visit Number 2   # unchanged due to screen only   Number of Visits 2    Date for PT Re-Evaluation 07/26/19    PT Start Time 1522    PT Stop Time 1528    PT Time Calculation (min) 6 min    Activity Tolerance Patient tolerated treatment well    Behavior During Therapy [facility] for tasks assessed/performed           Past Medical History:  Diagnosis Date   Depression    Family history of colon cancer    Monoallelic mutation of CHEK2 gene in female patient    Personal history of colonic polyps    S/P partial colectomy     Past Surgical History:  Procedure Laterality Date   BREAST LUMPECTOMY WITH RADIOACTIVE SEED AND SENTINEL LYMPH NODE BIOPSY Right 06/30/2019   Procedure: RIGHT BREAST LUMPECTOMY WITH RADIOACTIVE SEED AND RIGHT AXILLARY SENTINEL NODE BIOPSY;  Surgeon: Rolm Bookbinder, MD;  Location: [place];  Service: General;  Laterality: Right;  PEC BLOCK   BREAST SURGERY     COLON SURGERY     Partial colectomy   COLONOSCOPY     POLYPECTOMY     PORTACATH PLACEMENT Left 06/30/2019   Procedure: INSERTION PORT-A-CATH WITH ULTRASOUND GUIDANCE;  Surgeon: Rolm Bookbinder, MD;  Location: [place];  Service: General;  Laterality: Left;    There were no vitals filed for this visit.   Subjective Assessment - 01/16/20 1524    Subjective Pt returns for 3 month L-Dex screen.    Pertinent History R breast cancer, ER +, PR -, HER 2+, will undergo R breast lumpectomy and SLNB on 06/30/19, stopped Tamoxifen a week ago                  L-DEX FLOWSHEETS - 01/16/20 1500      L-DEX  LYMPHEDEMA SCREENING   Measurement Type Unilateral    L-DEX MEASUREMENT EXTREMITY Upper Extremity    POSITION  Standing    DOMINANT SIDE Right    At Risk Side Right    BASELINE SCORE (UNILATERAL) -0.3    L-DEX SCORE (UNILATERAL) -1.4    VALUE CHANGE (UNILAT) -1.1                                  PT Long Term Goals - 07/28/19 1157      PT LONG TERM GOAL #1   Title Pt will return to baseline shoulder ROM following R breast lumpectomy.    Time 4    Period Weeks    Status Achieved                 Plan - 01/16/20 1531    Clinical Impression Statement Pt returns for 3 month L-Dex screen. Her change from baseline of -1.1 is WNLs so no further treatment required at this time except to cont every 3 month L-Dex screens. Pt is agreeable to this.    PT Next Visit Plan Cont every 3 month L-Dex screens.    Consulted and Agree with Plan of Care Patient  Patient will benefit from skilled therapeutic intervention in order to improve the following deficits and impairments:     Visit Diagnosis: Malignant neoplasm of upper-outer quadrant of right breast in female, estrogen receptor positive [facility])     Problem List Patient Active Problem List   Diagnosis Date Noted   Vitamin D deficiency 10/27/2019   Hyperparathyroidism ([place]) 10/25/2019   Port-A-Cath in place 07/29/2019   Malignant neoplasm of upper-outer quadrant of right breast in female, estrogen receptor positive ([place]) 06/17/2019   Genetic testing 78/41/2820   Monoallelic mutation of CHEK2 gene in female patient    Family history of colon cancer    Personal history of colonic polyps     Otelia Limes, PTA 01/16/2020, 5:04 PM  [place], [place], [address] Phone: [phone]   Fax:  [phone]  Name: Abigail Berg MRN: [id number] Date of Birth: 1965-06-22

## 2020-01-18 NOTE — Progress Notes (Addendum)
PCP - none  Dr. Nori Riis in [place] prescribes meds Cardiologist - lov 08-02-19 with Dr. Minus Breeding epic  PPM/ICD -  Device Orders -  Rep Notified -   Chest x-ray - I view 06-10-19 epic EKG - 08-02-19 epic Stress Test -  ECHO - 11-04-19 epic Cardiac Cath -   Sleep Study -  CPAP -   Fasting Blood Sugar -  Checks Blood Sugar _____ times a day  Blood Thinner Instructions: Aspirin Instructions:  ERAS Protcol - PRE-SURGERY Ensure or G2-   COVID TEST- 12-18  Activity--Able to do own housework and walk to mailbox without SOB Anesthesia review: Right breast cancer   Patient denies shortness of breath, fever, cough and chest pain at PAT appointment  NONE   All instructions explained to the patient, with a verbal understanding of the material. Patient agrees to go over the instructions while at home for a better understanding. Patient also instructed to self quarantine after being tested for COVID-19. The opportunity to ask questions was provided.

## 2020-01-18 NOTE — Patient Instructions (Signed)
DUE TO COVID-19 ONLY ONE VISITOR IS ALLOWED TO COME WITH YOU AND STAY IN THE WAITING ROOM ONLY DURING PRE OP AND PROCEDURE DAY OF SURGERY. THE 1 VISITOR  MAY VISIT WITH YOU AFTER SURGERY IN YOUR PRIVATE ROOM DURING VISITING HOURS ONLY!  YOU NEED TO HAVE A COVID 19 TEST ON_   12-18______ @_______ , THIS TEST MUST BE DONE BEFORE SURGERY,  COVID TESTING SITE [address] [place] [address], IT IS ON THE RIGHT GOING OUT [address] APPROXIMATELY  2 MINUTES PAST [employer] ON THE RIGHT. ONCE YOUR COVID TEST IS COMPLETED,  PLEASE BEGIN THE QUARANTINE INSTRUCTIONS AS OUTLINED IN YOUR HANDOUT.                Abigail Berg  01/18/2020   Your procedure is scheduled on: 01-25-20    Report to [facility]  Main  Entrance   Report to admitting at       0830  AM     Call this number if you have problems the morning of surgery [phone]    Remember: Do not eat food:After Midnight. You may have clear liquids until 0730 am then nothing by mouth  BRUSH YOUR TEETH MORNING OF SURGERY AND RINSE YOUR MOUTH OUT, NO CHEWING GUM CANDY OR MINTS.     Take these medicines the morning of surgery with A SIP OF WATER: Tamoxifen, gabapentin,Lexapro                                 You may not have any metal on your body including hair pins and              piercings  Do not wear jewelry, make-up, lotions, powders or perfumes, deodorant             Do not wear nail polish on your fingernails.  Do not shave  48 hours prior to surgery.     Do not bring valuables to the hospital. [place].  Contacts, dentures or bridgework may not be worn into surgery. .     Patients discharged the day of surgery will not be allowed to drive home. IF YOU ARE HAVING SURGERY AND GOING HOME THE SAME DAY, YOU MUST HAVE AN ADULT TO DRIVE YOU HOME AND BE WITH YOU FOR 24 HOURS. YOU MAY GO HOME BY TAXI OR UBER OR ORTHERWISE, BUT AN ADULT MUST ACCOMPANY YOU  HOME AND STAY WITH YOU FOR 24 HOURS.  Name and phone number of your driver:  Special Instructions: N/A              Please read over the following fact sheets you were given: _____________________________________________________________________             [facility] - Preparing for Surgery Before surgery, you can play an important role.  Because skin is not sterile, your skin needs to be as free of germs as possible.  You can reduce the number of germs on your skin by washing with CHG (chlorahexidine gluconate) soap before surgery.  CHG is an antiseptic cleaner which kills germs and bonds with the skin to continue killing germs even after washing. Please DO NOT use if you have an allergy to CHG or antibacterial soaps.  If your skin becomes reddened/irritated stop using the CHG and inform your  nurse when you arrive at Short Stay. Do not shave (including legs and underarms) for at least 48 hours prior to the first CHG shower.  You may shave your face/neck. Please follow these instructions carefully:  1.  Shower with CHG Soap the night before surgery and the  morning of Surgery.  2.  If you choose to wash your hair, wash your hair first as usual with your  normal  shampoo.  3.  After you shampoo, rinse your hair and body thoroughly to remove the  shampoo.                           4.  Use CHG as you would any other liquid soap.  You can apply chg directly  to the skin and wash                       Gently with a scrungie or clean washcloth.  5.  Apply the CHG Soap to your body ONLY FROM THE NECK DOWN.   Do not use on face/ open                           Wound or open sores. Avoid contact with eyes, ears mouth and genitals (private parts).                       Wash face,  Genitals (private parts) with your normal soap.             6.  Wash thoroughly, paying special attention to the area where your surgery  will be performed.  7.  Thoroughly rinse your body with warm water from the neck  down.  8.  DO NOT shower/wash with your normal soap after using and rinsing off  the CHG Soap.                9.  Pat yourself dry with a clean towel.            10.  Wear clean pajamas.            11.  Place clean sheets on your bed the night of your first shower and do not  sleep with pets. Day of Surgery : Do not apply any lotions/deodorants the morning of surgery.  Please wear clean clothes to the hospital/surgery center.  FAILURE TO FOLLOW THESE INSTRUCTIONS MAY RESULT IN THE CANCELLATION OF YOUR SURGERY PATIENT SIGNATURE_________________________________  NURSE SIGNATURE__________________________________  ________________________________________________________________________

## 2020-01-19 ENCOUNTER — Other Ambulatory Visit: Payer: Self-pay

## 2020-01-19 ENCOUNTER — Encounter (HOSPITAL_COMMUNITY)
Admission: RE | Admit: 2020-01-19 | Discharge: 2020-01-19 | Disposition: A | Discharge status: Home / Self Care | Payer: BC Managed Care – PPO | Source: Ambulatory Visit | Attending: Surgery | Admitting: Surgery

## 2020-01-19 ENCOUNTER — Encounter (HOSPITAL_COMMUNITY): Payer: Self-pay

## 2020-01-19 DIAGNOSIS — Z01812 Encounter for preprocedural laboratory examination: Secondary | ICD-10-CM | POA: Diagnosis not present

## 2020-01-19 HISTORY — DX: Malignant (primary) neoplasm, unspecified: C80.1

## 2020-01-19 HISTORY — DX: Personal history of urinary calculi: Z87.442

## 2020-01-19 HISTORY — DX: Pneumonia, unspecified organism: J18.9

## 2020-01-19 LAB — BASIC METABOLIC PANEL
Anion gap: 7 (ref 5–15)
BUN: 14 mg/dL (ref 6–20)
CO2: 24 mmol/L (ref 22–32)
Calcium: 10.5 mg/dL — ABNORMAL HIGH (ref 8.9–10.3)
Chloride: 107 mmol/L (ref 98–111)
Creatinine, Ser: 0.82 mg/dL (ref 0.44–1.00)
GFR, Estimated: >60 mL/min (ref >60)
Glucose, Bld: 89 mg/dL (ref 70–99)
Potassium: 4.3 mmol/L (ref 3.5–5.1)
Sodium: 138 mmol/L (ref 135–145)

## 2020-01-19 LAB — CBC
HCT: 44.9 % (ref 36.0–46.0)
Hemoglobin: 15.5 g/dL — ABNORMAL HIGH (ref 12.0–15.0)
MCH: 32.3 pg (ref 26.0–34.0)
MCHC: 34.5 g/dL (ref 30.0–36.0)
MCV: 93.5 fL (ref 80.0–100.0)
Platelets: 222 10*3/uL (ref 150–400)
RBC: 4.8 MIL/uL (ref 3.87–5.11)
RDW: 12.4 % (ref 11.5–15.5)
WBC: 5.4 10*3/uL (ref 4.0–10.5)
nRBC: 0 % (ref 0.0–0.2)

## 2020-01-21 ENCOUNTER — Encounter (HOSPITAL_COMMUNITY): Payer: Self-pay | Admitting: Surgery

## 2020-01-21 ENCOUNTER — Other Ambulatory Visit (HOSPITAL_COMMUNITY)
Admission: RE | Admit: 2020-01-21 | Discharge: 2020-01-21 | Disposition: A | Discharge status: Home / Self Care | Payer: BC Managed Care – PPO | Source: Ambulatory Visit | Attending: Surgery | Admitting: Surgery

## 2020-01-21 DIAGNOSIS — K439 Ventral hernia without obstruction or gangrene: Secondary | ICD-10-CM | POA: Diagnosis present

## 2020-01-21 DIAGNOSIS — Z20822 Contact with and (suspected) exposure to covid-19: Secondary | ICD-10-CM | POA: Insufficient documentation

## 2020-01-21 DIAGNOSIS — Z01812 Encounter for preprocedural laboratory examination: Secondary | ICD-10-CM | POA: Diagnosis not present

## 2020-01-21 LAB — SARS CORONAVIRUS 2 (TAT 6-24 HRS): SARS Coronavirus 2: NEGATIVE

## 2020-01-21 NOTE — H&P (Signed)
General Surgery [facility]  Abigail Berg DOB: 09-18-1965 Married / Language: English / Race: White Female   History of Present Illness  The patient is a 54 year old female who presents with primary hyperparathyroidism.  CHIEF COMPLAINT: primary hyperparathyroidism, ventral hernia  Patient is referred by Dr. Vivia Ewing for surgical evaluation and management of suspected primary hyperparathyroidism. Patient also has a ventral hernia which she would like to have addressed concurrently. Patient has been treated during the past year by my partner, Dr. Rolm Bookbinder, for breast carcinoma. Patient was noted on routine laboratory studies to have elevated serum calcium levels ranging from 10.6-11.4. Intact PTH levels of range from 58-87. Patient does have vitamin D deficiency with a 25-hydroxy vitamin D level of 18. She is currently taking vitamin D3 at 2000 international units daily. Patient underwent a 24-hour urine collection for calcium which was markedly elevated at 518. Patient has noted significant fatigue but is in the midst of treatment for breast carcinoma. She does have a history of nephrolithiasis. She denies any fractures. She does have bone and joint pain particularly in the right hip and right knee. Patient has had no prior head or neck surgery other than an infusion port in the left internal jugular vein. There is no family history of parathyroid disease or other endocrine neoplasm. 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 She is accompanied today by a friend.  Patient also noted a bulge in the anterior abdominal wall earlier this year. It has gradually increased in size. It causes mild intermittent discomfort. CT scan performed in [place] demonstrates a ventral hernia containing fatty tissue.   Allergies  No Known Drug Allergies  Allergies Reconciled   Medication History   Gabapentin (300MG  Capsule, Oral) Active. Lidocaine-Prilocaine (2.5-2.5% Cream, External) Active. Escitalopram Oxalate (20MG  Tablet, Oral) Active. Vitamin D (Oral) Specific strength unknown - Active. Multivitamin (Oral) Active. Medications Reconciled  Vitals Weight: 194.25 lb Height: 68in Body Surface Area: 2.02 m Body Mass Index: 29.54 kg/m  Temp.: 98.59F  Pulse: 91 (Regular)  P.OX: 99% (Room air) BP: 126/84(Sitting, Left Arm, Standard)  Physical Exam   GENERAL APPEARANCE Development: normal Nutritional status: normal Gross deformities: none  SKIN Rash, lesions, ulcers: none Induration, erythema: none Nodules: none palpable  EYES Conjunctiva and lids: normal Pupils: equal and reactive Iris: normal bilaterally  EARS, NOSE, MOUTH, THROAT External ears: no lesion or deformity External nose: no lesion or deformity Hearing: grossly normal Due to Covid-19 pandemic, patient is wearing a mask.  NECK Symmetric: yes Trachea: midline Thyroid: no palpable nodules in the thyroid bed There is a palpable infusion port line present in the low left neck going into the internal jugular vein.  CHEST Respiratory effort: normal Retraction or accessory muscle use: no Breath sounds: normal bilaterally Rales, rhonchi, wheeze: none  CARDIOVASCULAR Auscultation: regular rhythm, normal rate Murmurs: none Pulses: radial pulse 2+ palpable Lower extremity edema: none  ABDOMEN Distension: none Masses: none palpable Tenderness: none Hepatosplenomegaly: not present Hernia: In the midline approximately 4 cm above the umbilicus is a ventral or epigastric hernia with hernia sac measuring about 5 cm in greatest diameter. It is firm, consistent with incarcerated preperitoneal fat or omentum. It is not reducible.  MUSCULOSKELETAL Station and gait: normal Digits and nails: no clubbing or cyanosis Muscle strength: grossly normal all extremities Range of motion: grossly  normal all extremities Deformity: none  LYMPHATIC Cervical: none palpable Supraclavicular: none palpable  PSYCHIATRIC  Oriented to person, place, and time: yes Mood and affect: normal for situation Judgment and insight: appropriate for situation    Assessment & Plan  PRIMARY HYPERPARATHYROIDISM (E21.0) VENTRAL HERNIA WITHOUT OBSTRUCTION OR GANGRENE (K43.9)  Follow Up - Call [facility] office after tests / studies doneto discuss further plans  Patient is referred by her endocrinologist for evaluation for possible primary hyperparathyroidism.  Patient provided with a copy of "Parathyroid Surgery: Treatment for Your Parathyroid Gland Problem", published by [employer], 12 pages. Book reviewed and explained to patient during visit today.  Patient has biochemical evidence of primary hyperparathyroidism. She has not had any imaging studies performed. I have recommended proceeding with an ultrasound examination of the neck as well as a nuclear medicine parathyroid scan. We will obtain these 2 studies in the near future. If they indicate the presence of a parathyroid adenoma, then I believe she will be a good candidate for minimally invasive surgery. We discussed the fact that most patients have a 95% chance of single gland disease. If these studies do not identify the adenoma, then we will order a 4D CT scan of the neck for further evaluation. We briefly discussed minimally invasive parathyroid surgery. We discussed the size and location of the surgical incision. We discussed doing this as an outpatient procedure. Patient understands and agrees to proceed with further diagnostic testing.  Patient also has a ventral hernia above the level of the umbilicus in the midline. She would like to have this repaired concurrently. She provided imaging from [place], [place], showing the location of the hernia with it appears to be incarcerated preperitoneal fatty tissue or omentum. I provided her with  literature on hernia repair to review at home. We discussed operative repair using a mesh patch for reinforcement. We discussed her risk of recurrence being approximately 5%. This could be done concurrently with her primary hyperparathyroid surgery as an outpatient procedure.  Patient will undergo the above imaging studies. We will contact her with those results and then move forward. Patient agrees with this plan.  ADDENDUM  Telephone call to patient with results of ultrasound and sestamibi scan. 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 Patient is a good candidate for minimally invasive surgery. Patient would like to have a ventral hernia repaired concurrently. This is small and both of these procedures can be done as an outpatient surgical procedure. I will go ahead and enter orders and send them to scheduling since the patient would like to get this accomplished before the end of the calendar year.  The risks and benefits of the procedure have been discussed at length with the patient. The patient understands the proposed procedure, potential alternative treatments, and the course of recovery to be expected. All of the patient's questions have been answered at this time. The patient wishes to proceed with surgery.  Armandina Gemma, [facility] Office: [phone]

## 2020-01-23 ENCOUNTER — Encounter (HOSPITAL_COMMUNITY): Payer: Self-pay | Admitting: Surgery

## 2020-01-23 ENCOUNTER — Ambulatory Visit: Payer: Self-pay | Admitting: Surgery

## 2020-01-23 ENCOUNTER — Other Ambulatory Visit: Payer: Self-pay

## 2020-01-23 ENCOUNTER — Inpatient Hospital Stay: Payer: BC Managed Care – PPO

## 2020-01-23 VITALS — BP 136/87 | HR 83 | Temp 98.3°F | Resp 16

## 2020-01-23 DIAGNOSIS — Z170 Estrogen receptor positive status [ER+]: Secondary | ICD-10-CM

## 2020-01-23 DIAGNOSIS — Z23 Encounter for immunization: Secondary | ICD-10-CM | POA: Diagnosis not present

## 2020-01-23 DIAGNOSIS — C50411 Malignant neoplasm of upper-outer quadrant of right female breast: Secondary | ICD-10-CM

## 2020-01-23 DIAGNOSIS — Z5112 Encounter for antineoplastic immunotherapy: Secondary | ICD-10-CM | POA: Diagnosis not present

## 2020-01-23 MED ORDER — SODIUM CHLORIDE 0.9% FLUSH
10.0000 mL | INTRAVENOUS | Status: DC | PRN
  Administered 2020-01-23: 10 mL
  Filled 2020-01-23: qty 10

## 2020-01-23 MED ORDER — SODIUM CHLORIDE 0.9 % IV SOLN
Freq: Once | INTRAVENOUS | Status: AC
  Filled 2020-01-23: qty 250

## 2020-01-23 MED ORDER — TRASTUZUMAB-ANNS CHEMO 150 MG IV SOLR
6.0000 mg/kg | Freq: Once | INTRAVENOUS | Status: AC
  Administered 2020-01-23: 504 mg via INTRAVENOUS
  Filled 2020-01-23: qty 24

## 2020-01-23 MED ORDER — ACETAMINOPHEN 325 MG PO TABS
650.0000 mg | ORAL_TABLET | Freq: Once | ORAL | Status: AC
  Administered 2020-01-23: 650 mg via ORAL

## 2020-01-23 MED ORDER — ACETAMINOPHEN 325 MG PO TABS
ORAL_TABLET | ORAL | Status: AC
  Filled 2020-01-23: qty 2

## 2020-01-23 MED ORDER — DIPHENHYDRAMINE HCL 25 MG PO CAPS
50.0000 mg | ORAL_CAPSULE | Freq: Once | ORAL | Status: AC
  Administered 2020-01-23: 50 mg via ORAL

## 2020-01-23 MED ORDER — DIPHENHYDRAMINE HCL 25 MG PO CAPS
ORAL_CAPSULE | ORAL | Status: AC
  Filled 2020-01-23: qty 2

## 2020-01-23 MED ORDER — HEPARIN SOD (PORK) LOCK FLUSH 100 UNIT/ML IV SOLN
500.0000 [IU] | Freq: Once | INTRAVENOUS | Status: AC | PRN
  Administered 2020-01-23: 500 [IU]
  Filled 2020-01-23: qty 5

## 2020-01-23 NOTE — Patient Instructions (Signed)
[facility] Discharge Instructions for Patients Receiving Chemotherapy  Today you received the following chemotherapy agents trastuzumab.  To help prevent nausea and vomiting after your treatment, we encourage you to take your nausea medication as directed.    If you develop nausea and vomiting that is not controlled by your nausea medication, call the clinic.   BELOW ARE SYMPTOMS THAT SHOULD BE REPORTED IMMEDIATELY:  *FEVER GREATER THAN 100.5 F  *CHILLS WITH OR WITHOUT FEVER  NAUSEA AND VOMITING THAT IS NOT CONTROLLED WITH YOUR NAUSEA MEDICATION  *UNUSUAL SHORTNESS OF BREATH  *UNUSUAL BRUISING OR BLEEDING  TENDERNESS IN MOUTH AND THROAT WITH OR WITHOUT PRESENCE OF ULCERS  *URINARY PROBLEMS  *BOWEL PROBLEMS  UNUSUAL RASH Items with * indicate a potential emergency and should be followed up as soon as possible.  Feel free to call the clinic should you have any questions or concerns. The clinic phone number is [phone].  Please show the CHEMO ALERT CARD at check-in to the Emergency Department and triage nurse.   

## 2020-01-24 ENCOUNTER — Other Ambulatory Visit (HOSPITAL_COMMUNITY): Payer: Self-pay | Admitting: Hematology

## 2020-01-24 ENCOUNTER — Inpatient Hospital Stay
Admission: RE | Admit: 2020-01-24 | Discharge: 2020-01-24 | Disposition: A | Discharge status: Home / Self Care | Payer: Self-pay | Source: Ambulatory Visit | Attending: Hematology | Admitting: Hematology

## 2020-01-24 DIAGNOSIS — C801 Malignant (primary) neoplasm, unspecified: Secondary | ICD-10-CM

## 2020-01-24 NOTE — Progress Notes (Signed)
Ms. Giannetti made aware to arrive at 7:30 AM on 01/25/2020, nothing to eat after midnight, liquids allowed until 6:30AM, verbalized understanding.

## 2020-01-25 ENCOUNTER — Encounter (HOSPITAL_COMMUNITY)
Admission: RE | Disposition: A | Discharge status: Home / Self Care | Payer: Self-pay | Source: Home / Self Care | Attending: Surgery

## 2020-01-25 ENCOUNTER — Ambulatory Visit (HOSPITAL_COMMUNITY): Payer: BC Managed Care – PPO | Admitting: Certified Registered Nurse Anesthetist

## 2020-01-25 ENCOUNTER — Ambulatory Visit (HOSPITAL_COMMUNITY)
Admission: RE | Admit: 2020-01-25 | Discharge: 2020-01-25 | Disposition: A | Discharge status: Home / Self Care | Payer: BC Managed Care – PPO | Attending: Surgery | Admitting: Surgery

## 2020-01-25 ENCOUNTER — Encounter (HOSPITAL_COMMUNITY): Payer: Self-pay | Admitting: Surgery

## 2020-01-25 DIAGNOSIS — E213 Hyperparathyroidism, unspecified: Secondary | ICD-10-CM

## 2020-01-25 DIAGNOSIS — Z79899 Other long term (current) drug therapy: Secondary | ICD-10-CM | POA: Insufficient documentation

## 2020-01-25 DIAGNOSIS — K436 Other and unspecified ventral hernia with obstruction, without gangrene: Secondary | ICD-10-CM | POA: Diagnosis not present

## 2020-01-25 DIAGNOSIS — E559 Vitamin D deficiency, unspecified: Secondary | ICD-10-CM | POA: Insufficient documentation

## 2020-01-25 DIAGNOSIS — E210 Primary hyperparathyroidism: Secondary | ICD-10-CM | POA: Diagnosis not present

## 2020-01-25 DIAGNOSIS — Z87442 Personal history of urinary calculi: Secondary | ICD-10-CM | POA: Diagnosis not present

## 2020-01-25 DIAGNOSIS — K439 Ventral hernia without obstruction or gangrene: Secondary | ICD-10-CM | POA: Diagnosis not present

## 2020-01-25 DIAGNOSIS — C50919 Malignant neoplasm of unspecified site of unspecified female breast: Secondary | ICD-10-CM | POA: Diagnosis not present

## 2020-01-25 DIAGNOSIS — D351 Benign neoplasm of parathyroid gland: Secondary | ICD-10-CM | POA: Insufficient documentation

## 2020-01-25 DIAGNOSIS — C50411 Malignant neoplasm of upper-outer quadrant of right female breast: Secondary | ICD-10-CM | POA: Diagnosis not present

## 2020-01-25 HISTORY — PX: PARATHYROIDECTOMY: SHX19

## 2020-01-25 HISTORY — PX: VENTRAL HERNIA REPAIR: SHX424

## 2020-01-25 LAB — PREGNANCY, URINE: Preg Test, Ur: NEGATIVE

## 2020-01-25 SURGERY — PARATHYROIDECTOMY
Anesthesia: General | Site: Neck

## 2020-01-25 MED ORDER — ROCURONIUM BROMIDE 10 MG/ML (PF) SYRINGE
PREFILLED_SYRINGE | INTRAVENOUS | Status: DC | PRN
  Administered 2020-01-25: 60 mg via INTRAVENOUS
  Administered 2020-01-25: 10 mg via INTRAVENOUS

## 2020-01-25 MED ORDER — BUPIVACAINE HCL 0.25 % IJ SOLN
INTRAMUSCULAR | Status: DC | PRN
  Administered 2020-01-25: 20 mL

## 2020-01-25 MED ORDER — LIDOCAINE 2% (20 MG/ML) 5 ML SYRINGE
INTRAMUSCULAR | Status: DC | PRN
  Administered 2020-01-25: 80 mg via INTRAVENOUS

## 2020-01-25 MED ORDER — LIDOCAINE HCL (PF) 2 % IJ SOLN
INTRAMUSCULAR | Status: AC
  Filled 2020-01-25: qty 5

## 2020-01-25 MED ORDER — EPHEDRINE 5 MG/ML INJ
INTRAVENOUS | Status: AC
  Filled 2020-01-25: qty 10

## 2020-01-25 MED ORDER — ONDANSETRON HCL 4 MG/2ML IJ SOLN
INTRAMUSCULAR | Status: DC | PRN
  Administered 2020-01-25: 4 mg via INTRAVENOUS

## 2020-01-25 MED ORDER — MIDAZOLAM HCL 2 MG/2ML IJ SOLN
INTRAMUSCULAR | Status: AC
  Filled 2020-01-25: qty 2

## 2020-01-25 MED ORDER — CHLORHEXIDINE GLUCONATE 0.12 % MT SOLN
15.0000 mL | Freq: Once | OROMUCOSAL | Status: AC
  Administered 2020-01-25: 15 mL via OROMUCOSAL

## 2020-01-25 MED ORDER — HYDROCODONE-ACETAMINOPHEN 5-325 MG PO TABS: 1.0000 | ORAL_TABLET | Freq: Four times a day (QID) | ORAL | 0 refills | Status: DC | PRN

## 2020-01-25 MED ORDER — OXYCODONE HCL 5 MG/5ML PO SOLN: 5.0000 mg | Freq: Once | ORAL | Status: DC | PRN

## 2020-01-25 MED ORDER — LACTATED RINGERS IV SOLN: INTRAVENOUS | Status: DC

## 2020-01-25 MED ORDER — PHENYLEPHRINE 40 MCG/ML (10ML) SYRINGE FOR IV PUSH (FOR BLOOD PRESSURE SUPPORT)
PREFILLED_SYRINGE | INTRAVENOUS | Status: DC | PRN
  Administered 2020-01-25: 80 ug via INTRAVENOUS

## 2020-01-25 MED ORDER — FENTANYL CITRATE (PF) 100 MCG/2ML IJ SOLN
INTRAMUSCULAR | Status: DC | PRN
  Administered 2020-01-25: 100 ug via INTRAVENOUS
  Administered 2020-01-25 (×2): 50 ug via INTRAVENOUS

## 2020-01-25 MED ORDER — MIDAZOLAM HCL 5 MG/5ML IJ SOLN
INTRAMUSCULAR | Status: DC | PRN
  Administered 2020-01-25: 2 mg via INTRAVENOUS

## 2020-01-25 MED ORDER — PROPOFOL 10 MG/ML IV BOLUS
INTRAVENOUS | Status: DC | PRN
  Administered 2020-01-25: 150 mg via INTRAVENOUS

## 2020-01-25 MED ORDER — ROCURONIUM BROMIDE 10 MG/ML (PF) SYRINGE
PREFILLED_SYRINGE | INTRAVENOUS | Status: AC
  Filled 2020-01-25: qty 10

## 2020-01-25 MED ORDER — 0.9 % SODIUM CHLORIDE (POUR BTL) OPTIME
TOPICAL | Status: DC | PRN
  Administered 2020-01-25: 1000 mL

## 2020-01-25 MED ORDER — FENTANYL CITRATE (PF) 100 MCG/2ML IJ SOLN
INTRAMUSCULAR | Status: AC
  Filled 2020-01-25: qty 2

## 2020-01-25 MED ORDER — BUPIVACAINE HCL 0.25 % IJ SOLN
INTRAMUSCULAR | Status: AC
  Filled 2020-01-25: qty 1

## 2020-01-25 MED ORDER — CHLORHEXIDINE GLUCONATE CLOTH 2 % EX PADS: 6.0000 | MEDICATED_PAD | Freq: Once | CUTANEOUS | Status: DC

## 2020-01-25 MED ORDER — DEXAMETHASONE SODIUM PHOSPHATE 10 MG/ML IJ SOLN
INTRAMUSCULAR | Status: AC
  Filled 2020-01-25: qty 1

## 2020-01-25 MED ORDER — DEXAMETHASONE SODIUM PHOSPHATE 4 MG/ML IJ SOLN
INTRAMUSCULAR | Status: DC | PRN
  Administered 2020-01-25: 10 mg via INTRAVENOUS

## 2020-01-25 MED ORDER — OXYCODONE HCL 5 MG PO TABS: 5.0000 mg | ORAL_TABLET | Freq: Once | ORAL | Status: DC | PRN

## 2020-01-25 MED ORDER — ONDANSETRON HCL 4 MG/2ML IJ SOLN: 4.0000 mg | Freq: Once | INTRAMUSCULAR | Status: DC | PRN

## 2020-01-25 MED ORDER — PROPOFOL 10 MG/ML IV BOLUS
INTRAVENOUS | Status: AC
  Filled 2020-01-25: qty 20

## 2020-01-25 MED ORDER — CEFAZOLIN SODIUM-DEXTROSE 2-4 GM/100ML-% IV SOLN
2.0000 g | INTRAVENOUS | Status: AC
  Administered 2020-01-25: 2 g via INTRAVENOUS
  Filled 2020-01-25: qty 100

## 2020-01-25 MED ORDER — ONDANSETRON HCL 4 MG/2ML IJ SOLN
INTRAMUSCULAR | Status: AC
  Filled 2020-01-25: qty 2

## 2020-01-25 MED ORDER — FENTANYL CITRATE (PF) 100 MCG/2ML IJ SOLN
25.0000 ug | INTRAMUSCULAR | Status: DC | PRN
  Administered 2020-01-25 (×3): 50 ug via INTRAVENOUS

## 2020-01-25 MED ORDER — SUGAMMADEX SODIUM 200 MG/2ML IV SOLN
INTRAVENOUS | Status: DC | PRN
  Administered 2020-01-25: 200 mg via INTRAVENOUS

## 2020-01-25 MED ORDER — AMISULPRIDE (ANTIEMETIC) 5 MG/2ML IV SOLN: 10.0000 mg | Freq: Once | INTRAVENOUS | Status: DC | PRN

## 2020-01-25 MED ORDER — ORAL CARE MOUTH RINSE: 15.0000 mL | Freq: Once | OROMUCOSAL | Status: AC

## 2020-01-25 SURGICAL SUPPLY — 42 items
ATTRACTOMAT 16X20 MAGNETIC DRP (DRAPES) ×3 IMPLANT
BINDER ABDOMINAL 12 ML 46-62 (SOFTGOODS) IMPLANT
BLADE SURG 15 STRL LF DISP TIS (BLADE) ×2 IMPLANT
BLADE SURG 15 STRL SS (BLADE) ×1
CHLORAPREP W/TINT 26 (MISCELLANEOUS) ×6 IMPLANT
CLIP VESOCCLUDE MED 6/CT (CLIP) ×6 IMPLANT
CLIP VESOCCLUDE SM WIDE 6/CT (CLIP) ×6 IMPLANT
COVER SURGICAL LIGHT HANDLE (MISCELLANEOUS) ×3 IMPLANT
COVER WAND RF STERILE (DRAPES) ×3 IMPLANT
DERMABOND ADVANCED (GAUZE/BANDAGES/DRESSINGS) ×2
DERMABOND ADVANCED .7 DNX12 (GAUZE/BANDAGES/DRESSINGS) ×4 IMPLANT
DRAPE LAPAROSCOPIC ABDOMINAL (DRAPES) ×3 IMPLANT
DRAPE LAPAROTOMY T 98X78 PEDS (DRAPES) ×3 IMPLANT
DRAPE UTILITY XL STRL (DRAPES) ×3 IMPLANT
ELECT REM PT RETURN 15FT ADLT (MISCELLANEOUS) ×3 IMPLANT
GAUZE 4X4 16PLY RFD (DISPOSABLE) ×3 IMPLANT
GAUZE SPONGE 4X4 12PLY STRL (GAUZE/BANDAGES/DRESSINGS) ×3 IMPLANT
GLOVE SURG ORTHO 8.0 STRL STRW (GLOVE) ×3 IMPLANT
GOWN STRL REUS W/TWL XL LVL3 (GOWN DISPOSABLE) ×9 IMPLANT
HEMOSTAT SURGICEL 2X4 FIBR (HEMOSTASIS) ×3 IMPLANT
ILLUMINATOR WAVEGUIDE N/F (MISCELLANEOUS) IMPLANT
KIT BASIN OR (CUSTOM PROCEDURE TRAY) ×3 IMPLANT
KIT TURNOVER KIT A (KITS) IMPLANT
MARKER SKIN DUAL TIP RULER LAB (MISCELLANEOUS) ×3 IMPLANT
MESH VENTRALEX ST 1-7/10 CRC S (Mesh General) ×3 IMPLANT
NEEDLE HYPO 25X1 1.5 SAFETY (NEEDLE) ×3 IMPLANT
PACK BASIC VI WITH GOWN DISP (CUSTOM PROCEDURE TRAY) ×3 IMPLANT
PACK GENERAL/GYN (CUSTOM PROCEDURE TRAY) ×3 IMPLANT
PENCIL SMOKE EVACUATOR (MISCELLANEOUS) ×3 IMPLANT
STAPLER VISISTAT 35W (STAPLE) ×3 IMPLANT
SUT MNCRL AB 4-0 PS2 18 (SUTURE) ×3 IMPLANT
SUT NOVA 1 T20/GS 25DT (SUTURE) IMPLANT
SUT NOVA NAB GS-21 0 18 T12 DT (SUTURE) IMPLANT
SUT PDS AB 1 CTX 36 (SUTURE) IMPLANT
SUT VIC AB 2-0 CT2 27 (SUTURE) IMPLANT
SUT VIC AB 3-0 SH 18 (SUTURE) ×3 IMPLANT
SYR BULB IRRIG 60ML STRL (SYRINGE) ×3 IMPLANT
SYR CONTROL 10ML LL (SYRINGE) ×3 IMPLANT
TOWEL OR 17X26 10 PK STRL BLUE (TOWEL DISPOSABLE) ×3 IMPLANT
TOWEL OR NON WOVEN STRL DISP B (DISPOSABLE) ×3 IMPLANT
TRAY FOLEY MTR SLVR 16FR STAT (SET/KITS/TRAYS/PACK) IMPLANT
TUBING CONNECTING 10 (TUBING) ×3 IMPLANT

## 2020-01-25 NOTE — Anesthesia Procedure Notes (Signed)
Procedure Name: Intubation Date/Time: 01/25/2020 10:17 AM Performed by: Claudia Desanctis, CRNA Pre-anesthesia Checklist: Patient identified, Emergency Drugs available, Suction available and Patient being monitored Patient Re-evaluated:Patient Re-evaluated prior to induction Oxygen Delivery Method: Circle system utilized Preoxygenation: Pre-oxygenation with 100% oxygen Induction Type: IV induction Ventilation: Mask ventilation without difficulty Laryngoscope Size: 2 and Miller Grade View: Grade I Tube type: Oral Tube size: 7.0 mm Number of attempts: 1 Airway Equipment and Method: Stylet Placement Confirmation: ETT inserted through vocal cords under direct vision,  positive ETCO2 and breath sounds checked- equal and bilateral Secured at: 21 cm Tube secured with: Tape Dental Injury: Teeth and Oropharynx as per pre-operative assessment and Injury to lip

## 2020-01-25 NOTE — Transfer of Care (Signed)
Immediate Anesthesia Transfer of Care Note  Patient: Abigail Berg  Procedure(s) Performed: LEFT INFERIOR PARATHYROIDECTOMY (Left Neck) HERNIA REPAIR VENTRAL ADULT WITH MESH (N/A Abdomen)  Patient Location: PACU  Anesthesia Type:General  Level of Consciousness: awake, alert , oriented and patient cooperative  Airway & Oxygen Therapy: Patient Spontanous Breathing and Patient connected to face mask  Post-op Assessment: Report given to RN and Post -op Vital signs reviewed and stable  Post vital signs: Reviewed and stable  Last Vitals:  Vitals Value Taken Time  BP 137/117 01/25/20 1158  Temp    Pulse 84 01/25/20 1201  Resp 15 01/25/20 1201  SpO2 99 % 01/25/20 1201  Vitals shown include unvalidated device data.  Last Pain:  Vitals:   01/25/20 0801  TempSrc:   PainSc: 0-No pain         Complications: No complications documented.

## 2020-01-25 NOTE — Interval H&P Note (Signed)
History and Physical Interval Note:  01/25/2020 9:38 AM  Abigail Berg  has presented today for surgery, with the diagnosis of PRIMARY HYPERPARATHYROIDISM VENTRAL HERNIA.  The various methods of treatment have been discussed with the patient and family. After consideration of risks, benefits and other options for treatment, the patient has consented to    Procedure(s): LEFT INFERIOR PARATHYROIDECTOMY (Left) [place] (N/A) as a surgical intervention.    The patient's history has been reviewed, patient examined, no change in status, stable for surgery.  I have reviewed the patient's chart and labs.  Questions were answered to the patient's satisfaction.    Armandina Gemma, MD [facility] Office: [place]

## 2020-01-25 NOTE — Anesthesia Postprocedure Evaluation (Signed)
Anesthesia Post Note  Patient: Abigail Berg  Procedure(s) Performed: LEFT INFERIOR PARATHYROIDECTOMY (Left Neck) HERNIA REPAIR VENTRAL ADULT WITH MESH (N/A Abdomen)     Patient location during evaluation: PACU Anesthesia Type: General Level of consciousness: awake and alert Pain management: pain level controlled Vital Signs Assessment: post-procedure vital signs reviewed and stable Respiratory status: spontaneous breathing, nonlabored ventilation and respiratory function stable Cardiovascular status: blood pressure returned to baseline and stable Postop Assessment: no apparent nausea or vomiting Anesthetic complications: no   No complications documented.  Last Vitals:  Vitals:   01/25/20 1200 01/25/20 1215  BP: 139/81 137/88  Pulse: 83 77  Resp: 17 20  Temp: 37 C 36.9 C  SpO2: 98% 96%    Last Pain:  Vitals:   01/25/20 1215  TempSrc:   PainSc: [place]

## 2020-01-25 NOTE — Op Note (Signed)
Operative Note  Pre-operative Diagnosis:  Primary hyperparathyroidism, ventral hernia  Post-operative Diagnosis:  same  Surgeon:  Abigail Gemma, MD  Assistant:  Carlena Hurl, PA-C   Procedure:  1. Left inferior parathyroidectomy  2. Open repair ventral hernia with mesh patch  Anesthesia:  general  Estimated Blood Loss:  minimal  Drains: none         Specimen: parathyroid to pathology  Indications:  Patient is referred by Dr. Vivia Ewing for surgical evaluation and management of suspected primary hyperparathyroidism. Patient also has a ventral hernia which she would like to have addressed concurrently. Patient has been treated during the past year by my partner, Dr. Rolm Bookbinder, for breast carcinoma. Patient was noted on routine laboratory studies to have elevated serum calcium levels ranging from 10.6-11.4. Intact PTH levels of range from 58-87. Patient does have vitamin D deficiency with a 25-hydroxy vitamin D level of 18. She is currently taking vitamin D3 at 2000 international units daily. Patient underwent a 24-hour urine collection for calcium which was markedly elevated at 518. Patient has noted significant fatigue but is in the midst of treatment for breast carcinoma. She does have a history of nephrolithiasis. She denies any fractures. She does have bone and joint pain particularly in the right hip and right knee.  Additional work-up included nuclear medicine parathyroid scan which localized a left inferior parathyroid adenoma.  Patient also was diagnosed with a ventral hernia seen on CT scan performed in [place], [place].  This appeared to contain fatty tissue and was just above the level of the umbilicus in the midline.  She desires concurrent repair.  Patient now comes to surgery for parathyroidectomy and repair of ventral hernia.  Procedure:  The patient was seen in the pre-op holding area. The risks, benefits, complications, treatment options, and expected  outcomes were previously discussed with the patient. The patient agreed with the proposed plan and has signed the informed consent form.  The patient was brought to the operating room by the surgical team, identified as Abigail Berg and the procedure verified. A "time out" was completed and the above information confirmed.  Following administration of general anesthesia, the patient is positioned and then prepped and draped in the usual aseptic fashion.  A left inferior neck incision is made with a #15 blade.  Dissection was carried through subcutaneous tissues and the platysma.  Hemostasis is achieved with the electrocautery.  Subplatysmal flaps are developed circumferentially and a Weitlander retractor placed for exposure.  Strap muscles are incised in the midline.  Strap muscles are reflected to the left exposing the inferior pole of the left thyroid lobe.  Lobe is gently mobilized.  Venous tributaries are divided between small ligaclips.  Thyroid gland is rolled anteriorly.  Exploration in the tracheoesophageal groove on the left reveals an enlarged inferior parathyroid gland.  This is gently dissected out.  Vascular pedicles divided between small ligaclips and the gland is excised.  It is submitted to pathology for frozen section where Dr. Vicente Males confirms hypercellular parathyroid tissue consistent with adenoma.  Exploration of the wound shows a normal left superior parathyroid gland just above the tubercle of Zuckerkandl.  Recurrent laryngeal nerve is identified and preserved posteriorly.  Good hemostasis is noted throughout the operative field.  Fibrillar is placed throughout the operative field.  Strap muscles are reapproximated the midline with interrupted 3-0 Vicryl sutures.  Platysma was closed with interrupted 3-0 Vicryl sutures.  Skin is anesthetized with local anesthetic.  Skin is closed with  a running 4-0 Monocryl subcuticular suture.  Dermabond is applied as dressing.  Next all of the  surgical drapes are removed.  Patient is then prepped and draped in the usual aseptic fashion for the ventral hernia repair in the mid abdominal wall.  After another timeout and assuring that anesthesia had been maintained, an incision is made in the midline just above the level of the umbilicus.  Dissection was carried into the subcutaneous tissues.  Hernia sac is identified and opened.  It contains preperitoneal fat and omental tissue.  Dissection is carried down to the fascia.  Fascial defect measures approximately 1.5 cm in greatest diameter.  The incarcerated tissue in the hernia is excised using the electrocautery for hemostasis.  This appears to simply be adipose tissue.  Preperitoneal plane is developed bluntly.  A 4.3 cm Bard Ventralex ST mesh patch is selected and prepared.  It is folded and moistened and inserted into the preperitoneal space where it is deployed circumferentially.  The fascial defect is then closed with interrupted 0 Novafil simple sutures incorporating the mesh in the closure.  Good approximation of fascia is achieved.  There is no tension on the closure.  Good hemostasis is noted throughout the wound.  Subcutaneous tissues are closed with interrupted 3-0 Vicryl sutures.  Skin is anesthetized with local anesthetic.  Skin is closed with a running 4-0 Monocryl subcuticular suture.  Wound is washed and dried and Dermabond is applied as dressing.  Patient is awakened from anesthesia and transferred to the recovery room.  The patient tolerated the procedure well.  Abigail Gemma, MD [facility] Office: [phone]

## 2020-01-25 NOTE — Anesthesia Preprocedure Evaluation (Signed)
Anesthesia Evaluation  Patient identified by MRN, date of birth, ID band Patient awake    Reviewed: Allergy & Precautions, NPO status , Patient's Chart, lab work & pertinent test results  History of Anesthesia Complications Negative for: history of anesthetic complications  Airway Mallampati: II  TM Distance: >3 FB Neck ROM: Full    Dental  (+) Teeth Intact   Pulmonary Current Smoker and Patient abstained from smoking.,    Pulmonary exam normal        Cardiovascular negative cardio ROS Normal cardiovascular exam     Neuro/Psych Depression negative neurological ROS     GI/Hepatic negative GI ROS, Neg liver ROS,   Endo/Other  negative endocrine ROSPrimary hyperparathyroidism  Renal/GU negative Renal ROS  negative genitourinary   Musculoskeletal negative musculoskeletal ROS (+)   Abdominal   Peds  Hematology   Anesthesia Other Findings  H/o breast cancer  Reproductive/Obstetrics                             Anesthesia Physical Anesthesia Plan  ASA: II  Anesthesia Plan: General   Post-op Pain Management:    Induction: Intravenous  PONV Risk Score and Plan: 2 and Ondansetron, Dexamethasone, Treatment may vary due to age or medical condition and Midazolam  Airway Management Planned: Oral ETT  Additional Equipment: None  Intra-op Plan:   Post-operative Plan: Extubation in OR  Informed Consent: I have reviewed the patients History and Physical, chart, labs and discussed the procedure including the risks, benefits and alternatives for the proposed anesthesia with the patient or authorized representative who has indicated his/her understanding and acceptance.     Dental advisory given  Plan Discussed with:   Anesthesia Plan Comments:         Anesthesia Quick Evaluation

## 2020-01-26 ENCOUNTER — Encounter (HOSPITAL_COMMUNITY): Payer: Self-pay | Admitting: Surgery

## 2020-01-26 ENCOUNTER — Inpatient Hospital Stay: Payer: BC Managed Care – PPO

## 2020-01-26 ENCOUNTER — Ambulatory Visit: Payer: BC Managed Care – PPO

## 2020-01-26 LAB — SURGICAL PATHOLOGY

## 2020-02-09 ENCOUNTER — Ambulatory Visit (HOSPITAL_COMMUNITY): Payer: BC Managed Care – PPO | Attending: Internal Medicine

## 2020-02-09 ENCOUNTER — Other Ambulatory Visit: Payer: Self-pay

## 2020-02-09 DIAGNOSIS — Z79899 Other long term (current) drug therapy: Secondary | ICD-10-CM | POA: Insufficient documentation

## 2020-02-09 LAB — ECHOCARDIOGRAM COMPLETE
Area-P 1/2: 4.06 cm2
S' Lateral: 3.4 cm

## 2020-02-13 ENCOUNTER — Telehealth: Payer: Self-pay | Admitting: *Deleted

## 2020-02-13 DIAGNOSIS — Z79899 Other long term (current) drug therapy: Secondary | ICD-10-CM

## 2020-02-13 DIAGNOSIS — E210 Primary hyperparathyroidism: Secondary | ICD-10-CM | POA: Diagnosis not present

## 2020-02-13 NOTE — Telephone Encounter (Signed)
-----   Message from Minus Breeding, MD sent at 02/12/2020  4:00 PM EST ----- Normal EF and strain.  It looks like she is continuing on Herceptin and will need a repeat echo in 3 months.  Call with these results that look OK.  Call Ms. Burdett with the results and send to oncology

## 2020-02-13 NOTE — Telephone Encounter (Signed)
Released in my chart Order place, sent to oncology, and message to scheduling to arrange Echo

## 2020-02-16 ENCOUNTER — Encounter: Payer: Self-pay | Admitting: Nurse Practitioner

## 2020-02-16 ENCOUNTER — Inpatient Hospital Stay: Payer: BC Managed Care – PPO

## 2020-02-16 ENCOUNTER — Encounter: Payer: Self-pay | Admitting: *Deleted

## 2020-02-16 ENCOUNTER — Inpatient Hospital Stay (HOSPITAL_BASED_OUTPATIENT_CLINIC_OR_DEPARTMENT_OTHER): Payer: BC Managed Care – PPO | Admitting: Nurse Practitioner

## 2020-02-16 ENCOUNTER — Inpatient Hospital Stay: Payer: BC Managed Care – PPO | Attending: Hematology

## 2020-02-16 ENCOUNTER — Other Ambulatory Visit: Payer: Self-pay

## 2020-02-16 VITALS — BP 142/95 | HR 78 | Temp 97.5°F | Resp 16 | Ht 69.0 in | Wt 197.4 lb

## 2020-02-16 DIAGNOSIS — Z170 Estrogen receptor positive status [ER+]: Secondary | ICD-10-CM | POA: Insufficient documentation

## 2020-02-16 DIAGNOSIS — C50411 Malignant neoplasm of upper-outer quadrant of right female breast: Secondary | ICD-10-CM | POA: Diagnosis not present

## 2020-02-16 DIAGNOSIS — Z1501 Genetic susceptibility to malignant neoplasm of breast: Secondary | ICD-10-CM

## 2020-02-16 DIAGNOSIS — E213 Hyperparathyroidism, unspecified: Secondary | ICD-10-CM | POA: Insufficient documentation

## 2020-02-16 DIAGNOSIS — Z1502 Genetic susceptibility to malignant neoplasm of ovary: Secondary | ICD-10-CM

## 2020-02-16 DIAGNOSIS — Z5112 Encounter for antineoplastic immunotherapy: Secondary | ICD-10-CM | POA: Insufficient documentation

## 2020-02-16 DIAGNOSIS — Z23 Encounter for immunization: Secondary | ICD-10-CM | POA: Insufficient documentation

## 2020-02-16 DIAGNOSIS — Z1589 Genetic susceptibility to other disease: Secondary | ICD-10-CM

## 2020-02-16 LAB — CMP (CANCER CENTER ONLY)
ALT: 7 U/L (ref 0–44)
AST: 11 U/L — ABNORMAL LOW (ref 15–41)
Albumin: 3.6 g/dL (ref 3.5–5.0)
Alkaline Phosphatase: 83 U/L (ref 38–126)
Anion gap: 7 (ref 5–15)
BUN: 11 mg/dL (ref 6–20)
CO2: 25 mmol/L (ref 22–32)
Calcium: 9 mg/dL (ref 8.9–10.3)
Chloride: 109 mmol/L (ref 98–111)
Creatinine: 0.83 mg/dL (ref 0.44–1.00)
GFR, Estimated: >60 mL/min (ref >60)
Glucose, Bld: 97 mg/dL (ref 70–99)
Potassium: 3.7 mmol/L (ref 3.5–5.1)
Sodium: 141 mmol/L (ref 135–145)
Total Bilirubin: 0.4 mg/dL (ref 0.3–1.2)
Total Protein: 6.9 g/dL (ref 6.5–8.1)

## 2020-02-16 LAB — CBC WITH DIFFERENTIAL (CANCER CENTER ONLY)
Abs Immature Granulocytes: 0.02 10*3/uL (ref 0.00–0.07)
Basophils Absolute: 0 10*3/uL (ref 0.0–0.1)
Basophils Relative: 1 %
Eosinophils Absolute: 0.1 10*3/uL (ref 0.0–0.5)
Eosinophils Relative: 1 %
HCT: 41.2 % (ref 36.0–46.0)
Hemoglobin: 14.3 g/dL (ref 12.0–15.0)
Immature Granulocytes: 0 %
Lymphocytes Relative: 21 %
Lymphs Abs: 1.7 10*3/uL (ref 0.7–4.0)
MCH: 31.2 pg (ref 26.0–34.0)
MCHC: 34.7 g/dL (ref 30.0–36.0)
MCV: 90 fL (ref 80.0–100.0)
Monocytes Absolute: 0.4 10*3/uL (ref 0.1–1.0)
Monocytes Relative: 5 %
Neutro Abs: 6 10*3/uL (ref 1.7–7.7)
Neutrophils Relative %: 72 %
Platelet Count: 241 10*3/uL (ref 150–400)
RBC: 4.58 MIL/uL (ref 3.87–5.11)
RDW: 12.7 % (ref 11.5–15.5)
WBC Count: 8.3 10*3/uL (ref 4.0–10.5)
nRBC: 0 % (ref 0.0–0.2)

## 2020-02-16 MED ORDER — DIPHENHYDRAMINE HCL 25 MG PO CAPS
ORAL_CAPSULE | ORAL | Status: AC
  Filled 2020-02-16: qty 2

## 2020-02-16 MED ORDER — TRASTUZUMAB-ANNS CHEMO 150 MG IV SOLR
6.0000 mg/kg | Freq: Once | INTRAVENOUS | Status: AC
  Administered 2020-02-16: 504 mg via INTRAVENOUS
  Filled 2020-02-16: qty 24

## 2020-02-16 MED ORDER — TAMOXIFEN CITRATE 20 MG PO TABS: 20.0000 mg | ORAL_TABLET | Freq: Every day | ORAL | 3 refills | Status: DC

## 2020-02-16 MED ORDER — SODIUM CHLORIDE 0.9 % IV SOLN
Freq: Once | INTRAVENOUS | Status: AC
  Filled 2020-02-16: qty 250

## 2020-02-16 MED ORDER — ACETAMINOPHEN 325 MG PO TABS
650.0000 mg | ORAL_TABLET | Freq: Once | ORAL | Status: AC
  Administered 2020-02-16: 650 mg via ORAL

## 2020-02-16 MED ORDER — SODIUM CHLORIDE 0.9% FLUSH
10.0000 mL | INTRAVENOUS | Status: DC | PRN
  Administered 2020-02-16: 10 mL
  Filled 2020-02-16: qty 10

## 2020-02-16 MED ORDER — HEPARIN SOD (PORK) LOCK FLUSH 100 UNIT/ML IV SOLN
500.0000 [IU] | Freq: Once | INTRAVENOUS | Status: AC | PRN
  Administered 2020-02-16: 500 [IU]
  Filled 2020-02-16: qty 5

## 2020-02-16 MED ORDER — DIPHENHYDRAMINE HCL 25 MG PO CAPS
50.0000 mg | ORAL_CAPSULE | Freq: Once | ORAL | Status: AC
  Administered 2020-02-16: 50 mg via ORAL

## 2020-02-16 MED ORDER — ACETAMINOPHEN 325 MG PO TABS
ORAL_TABLET | ORAL | Status: AC
  Filled 2020-02-16: qty 2

## 2020-02-16 NOTE — Progress Notes (Signed)
[place]   [phone] [phone]) [phone]   Clinic Follow up Note   Patient Care Team: Patient, [name] as PCP - General ([place]) Minus Breeding, MD as PCP - Cardiology (Cardiology) Armbruster, Carlota Raspberry, MD as Consulting Physician (Gastroenterology) Eyvonne Mechanic as Counselor (Licensed Clinical Social Worker) Truitt Merle, MD as Consulting Physician (Hematology) Alla Feeling, NP as Nurse Practitioner (Nurse Practitioner) Mauro Kaufmann, RN as Oncology Nurse Navigator Rockwell Germany, RN as Oncology Nurse Navigator Rolm Bookbinder, MD as Consulting Physician (General Surgery) 02/16/2020  CHIEF COMPLAINT: Follow up right breast cancer   SUMMARY OF ONCOLOGIC HISTORY: Oncology History Overview Note  Cancer Staging Malignant neoplasm of upper-outer quadrant of right breast in female, estrogen receptor positive ([place]) Staging form: Breast, AJCC 8th Edition - Clinical stage from 06/01/2019: Stage IA (cT1c, cN0, cM0, G3, ER+, PR-, HER2+) - Signed by Truitt Merle, MD on 06/17/2019 - Pathologic stage from 06/30/2019: Stage IA (pT1c, pN0, cM0, G3, ER+, PR-, HER2+) - Signed by Truitt Merle, MD on 07/15/2019    Malignant neoplasm of upper-outer quadrant of right breast in female, estrogen receptor positive ([place])  10/2018 -  Anti-estrogen oral therapy   She was preventively started on Tamoxifen since 10/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 Held after 06/17/19 to proceed with her breast cancer surgery and chemo.    05/06/2019 Breast MRI   IMPRESSION Enhancing right breast mass suspicious for malignancy. Second look [place] if recommended.  No Suspicious enhancing left breast masses.    05/24/2019 Breast US   IMPRESSION 1.8cm irregular right breast mass within the 9:30 position 3.5cm from the nipple is highly suggestive of malignancy. An Ultrasound guided breast biopsy is recommended. The findings of the study and recommendation for biopsy were discussed with the  patient directly.    06/01/2019 Cancer Staging   Staging form: Breast, AJCC 8th Edition - Clinical stage from 06/01/2019: Stage IA (cT1c, cN0, cM0, G3, ER+, PR-, HER2+) - Signed by Truitt Merle, MD on 06/17/2019   06/01/2019 Initial Biopsy   Breast Biopsy - Right Breast Core Bx DIAGNOSIS INFILTRATING DUCT CARCINOMA WITH MINOR DUCTAL CARCINOMA IN SITU COMPNENT OF RIGHT BREAST, CORE NEEDLE BIOPSY    06/01/2019 Receptors her2   ER: greater than 75% of tumor cells show moderate staining  PR: Negative  HER2: Positive 3+ Ki67: 40%   06/17/2019 Initial Diagnosis   Malignant neoplasm of upper-outer quadrant of right breast in female, estrogen receptor positive ([place])   06/30/2019 Surgery   RIGHT BREAST LUMPECTOMY WITH RADIOACTIVE SEED AND RIGHT AXILLARY SENTINEL NODE BIOPSY and PAC placement  by Dr Donne Hazel     06/30/2019 Pathology Results   FINAL MICROSCOPIC DIAGNOSIS:   A. BREAST, RIGHT, LUMPECTOMY:  - Invasive ductal carcinoma, grade 3, spanning 1.5 cm.  - Intermediate grade ductal carcinoma in situ.  - Invasive carcinoma is 0.1-0.2 from the final lateral margin (part E).  - Margins are negative for in situ carcinoma.  - Biopsy site.  - See oncology table.   B. LYMPH NODE, RIGHT AXILLARY, SENTINEL, BIOPSY:  - One of one lymph nodes negative for carcinoma (0/1).   C. LYMPH NODE, RIGHT AXILLARY, SENTINEL, BIOPSY:  - One of one lymph nodes negative for carcinoma (0/1).   D. LYMPH NODE, RIGHT AXILLARY, SENTINEL, BIOPSY:  - One of one lymph nodes negative for carcinoma (0/1).   E. BREAST, RIGHT ADDITIONAL LATERAL MARGIN, EXCISION:  - Invasive ductal carcinoma.  - Invasive carcinoma is 0.1-0.2 cm from  the new lateral margin.   F. BREAST, RIGHT ADDITIONAL POSTERIOR MARGIN, EXCISION:  - Fibrocystic change and usual ductal hyperplasia.  - No malignancy identified.   G. BREAST, RIGHT ADDITIONAL SUPERIOR MARGIN, EXCISION:  - Fibrocystic change and usual ductal hyperplasia.  - Incidental  radial scar.  - No malignancy identified.     PROGNOSTIC INDICATOR RESULTS:  The tumor cells are POSITIVE for Her2 (3+).  Estrogen Receptor:       POSITIVE, 85%, MODERATE STAINING  Progesterone Receptor:   NEGATIVE  Proliferation Marker Ki-67:   25%    06/30/2019 Cancer Staging   Staging form: Breast, AJCC 8th Edition - Pathologic stage from 06/30/2019: Stage IA (pT1c, pN0, cM0, G3, ER+, PR-, HER2+) - Signed by Truitt Merle, MD on 07/15/2019   07/29/2019 -  Chemotherapy   Adjuvant Weekly Abraxane and Transtuzumab for 12 weeks starting 07/29/19-10/12/19, followed by maintenance Herceptin q3weeks starting 11/03/19 to complete 1 year of treatment. Due to headaches her Herceptin (Ogivr) was switched to Herceptin Calla Kicks) on 09/08/19. Due to her insurance denial of Abraxane, I changed it to paclitaxel starting 09/15/19.     CURRENT THERAPY:  Maintenance Herceptin(Kanjinti)q3weeksstarting 9/30/21to complete 1 year of treatmentfrom 07/29/19. Completed adjuvant radiation in 12/2019 and resumed Tamoxifen   INTERVAL HISTORY: Ms. Manges returns for follow up and treatment as scheduled. She was seen 12/15/19 and continues trastuzumab/biosimilar q3 weeks. She tolerates infusions well without side effects. She had parathyroid surgery, recovered well. Leg pain and fatigued improved after surgery. She also recovered well from RT, had a follow up earlier today with a breast exam. Restarted tamoxifen with no issues. Energy and appetite are adequate. Denies fever, chills, cough, chest pain, pain, dyspnea, n/v/c/d, breast lump, or other new concerns.     MEDICAL HISTORY:  Past Medical History:  Diagnosis Date  . Cancer [facility])    right breast  . Depression   . Family history of colon cancer   . History of kidney stones   . Monoallelic mutation of CHEK2 gene in female patient   . Personal history of colonic polyps   . Pneumonia   . S/P partial colectomy     SURGICAL HISTORY: Past Surgical History:   Procedure Laterality Date  . BREAST LUMPECTOMY WITH RADIOACTIVE SEED AND SENTINEL LYMPH NODE BIOPSY Right 06/30/2019   Procedure: RIGHT BREAST LUMPECTOMY WITH RADIOACTIVE SEED AND RIGHT AXILLARY SENTINEL NODE BIOPSY;  Surgeon: Rolm Bookbinder, MD;  Location: [facility];  Service: General;  Laterality: Right;  PEC BLOCK  . BREAST SURGERY    . COLON SURGERY     Partial colectomy  . COLONOSCOPY    . PARATHYROIDECTOMY Left 01/25/2020   Procedure: LEFT INFERIOR PARATHYROIDECTOMY;  Surgeon: Armandina Gemma, MD;  Location: [facility];  Service: General;  Laterality: Left;  . POLYPECTOMY    . PORTACATH PLACEMENT Left 06/30/2019   Procedure: INSERTION PORT-A-CATH WITH ULTRASOUND GUIDANCE;  Surgeon: Rolm Bookbinder, MD;  Location: [place];  Service: General;  Laterality: Left;  Marland Kitchen VENTRAL HERNIA REPAIR N/A 01/25/2020   Procedure: HERNIA REPAIR VENTRAL ADULT WITH MESH;  Surgeon: Armandina Gemma, MD;  Location: [facility];  Service: General;  Laterality: N/A;    I have reviewed the social history and family history with the patient and they are unchanged from previous note.  ALLERGIES:  has No Known Allergies.  MEDICATIONS:  Current Outpatient Medications  Medication Sig Dispense Refill  . Cholecalciferol (VITAMIN D) 50 MCG (2000 UT) tablet Take 2,000 Units by mouth daily.    Marland Kitchen  escitalopram (LEXAPRO) 20 MG tablet Take 20 mg by mouth daily.     Marland Kitchen gabapentin (NEURONTIN) 300 MG capsule Take 1 capsule (300 mg total) by mouth 3 (three) times daily. 90 capsule 3  . HYDROcodone-acetaminophen (NORCO/VICODIN) 5-325 MG tablet Take 1-2 tablets by mouth every 6 (six) hours as needed for moderate pain. 20 tablet 0  . ibuprofen (ADVIL) 200 MG tablet Take 600 mg by mouth every 6 (six) hours as needed for headache or mild pain.    Marland Kitchen lidocaine-prilocaine (EMLA) cream Apply to affected area once (Patient taking differently: Apply 1 application topically daily as needed (port access).) 30 g 3   . tamoxifen (NOLVADEX) 20 MG tablet Take 1 tablet (20 mg total) by mouth daily. 90 tablet 3   No current facility-administered medications for this visit.   Facility-Administered Medications Ordered in Other Visits  Medication Dose Route Frequency Provider Last Rate Last Admin  . heparin lock flush 100 unit/mL  500 Units Intracatheter Once PRN Truitt Merle, MD      . sodium chloride flush (NS) 0.9 % injection 10 mL  10 mL Intracatheter PRN Truitt Merle, MD      . Theotis Burrow [facility]) 504 mg in sodium chloride 0.9 % 250 mL chemo infusion  6 mg/kg (Treatment Plan Recorded) Intravenous Once Truitt Merle, MD        PHYSICAL EXAMINATION: ECOG PERFORMANCE STATUS: 0 - Asymptomatic  Vitals:   02/16/20 1326  BP: (!) 142/95  Pulse: 78  Resp: 16  Temp: (!) 97.5 F (36.4 C)  SpO2: 99%   Filed Weights   02/16/20 1326  Weight: 197 lb 6.4 oz (89.5 kg)    Patient appears well via video. Speech is clear. AOx4. Mood/affect appear normal. No cough or conversational dyspnea. Respirations unlabored. Steady gait.   LABORATORY DATA:  I have reviewed the data as listed CBC Latest Ref Rng & Units 02/16/2020 01/19/2020 12/15/2019  WBC 4.0 - 10.5 K/uL 8.3 5.4 5.6  Hemoglobin 12.0 - 15.0 g/dL 14.3 15.5(H) 14.2  Hematocrit 36.0 - 46.0 % 41.2 44.9 41.1  Platelets 150 - 400 K/uL 241 222 238     CMP Latest Ref Rng & Units 02/16/2020 01/19/2020 12/15/2019  Glucose 70 - 99 mg/dL 97 89 112(H)  BUN 6 - 20 mg/dL $Remove'11 14 13  'JyvpnlO$ Creatinine 0.44 - 1.00 mg/dL 0.83 0.82 0.84  Sodium 135 - 145 mmol/L 141 138 139  Potassium 3.5 - 5.1 mmol/L 3.7 4.3 3.8  Chloride 98 - 111 mmol/L 109 107 107  CO2 22 - 32 mmol/L $RemoveB'25 24 26  'FRNwCGie$ Calcium 8.9 - 10.3 mg/dL 9.0 10.5(H) 10.9(H)  Total Protein 6.5 - 8.1 g/dL 6.9 - 6.8  Total Bilirubin 0.3 - 1.2 mg/dL 0.4 - 0.4  Alkaline Phos 38 - 126 U/L 83 - 82  AST 15 - 41 U/L 11(L) - 16  ALT 0 - 44 U/L 7 - 10      RADIOGRAPHIC STUDIES: I have personally reviewed the radiological images  as listed and agreed with the findings in the report. No results found.   ASSESSMENT & PLAN: Nhu Glasby a 55 y.o.femalewith    1.Malignant neoplasm of upper-outer quadrant of right breast,Stage1A,p(T1cN0M0),stage IA,ER+/PR-/HER2+, Grade3. -Diagnosed in 05/2019. S/p right breast lumpectomy with SLNB by Dr Donne Hazel on 06/30/19 and adjuvant weekly abraxane/herceptin 07/29/19 - 10/12/19 and adjuvant RT. She will continue maintenance herceptin q3weeks to complete 1 year of treatment. -resumed Tamoxifen after RT, tolerating well  2. 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 -previouslydiscussed that CHEK2 1100delC mutation is associated with 2-4 times high risk of breast cancer than general population, especially in people with strong family history of breast cancer(she only has 1 family member with breast cancer and 3 with colon cancer). Her estimated lifetime risk of breast cancer is probably around 25-35% -She will continueannual screening breast MRI in addition to mammogram,self breast exam, and physician breast exam twice a year.Her GYN will order for her to be done locally. -previously discussed themoderately increasedrisk of colon cancer from CHEK2 mutation; she will f/u with Dr. Havery Moros for screening colonoscopies, last done 07/2018 with12polyps. Plan to repeat in next 2 years.  -She started chemoprevention withTamoxifen once daily in 10/2018. Held 06/17/19 -01/2020 for chemo/surgery/RT  3. S/p ileocecectomy on 08/14/15 due to benign colon polyp invading appendix, history of recurrent colon polyps  4.H/o right breast fibroadenoma,S/p rightsurgical resection on 11/22/15  5. Hypercalcemia, likelyprimary hyperparathyroidism -She is not on Calcium or Vit D, but takes MVI  -Her PTH was within normal limits, but on the high end. PTHrp was negative. This is likelyprimary  hyperparathyroidism -calcium 10-11 range -s/p left parathyroid removal 01/25/20. Path showed hypercellular parathyroid c/w adenoma   6.  Umbilical hernia -repaired during parathyroid surgery 01/25/20   Dispo:  Ms. Slatten is clinically doing well. She has recovered from RT and restarted Tamoxifen. She continues q3 weeks trastuzumab/biosimilar, tolerates treatment very well without significant side effects.   Labs reviewed, CBC normal. CMP unremarkable and hypercalcemia resolved s/p left parathyroidectomy. She will proceed with kanjinti today as planned. She is scheduled for f/up right [place] on 03/01/20, we will follow up with results.   Continue kanjinti q3 weeks and tamoxifen daily; f/up in 6 weeks.  All questions were answered. The patient knows to call the clinic with any problems, questions or concerns. No barriers to learning were detected. I spent a total of 20 minutes in today's video encounter.      Alla Feeling, NP 02/16/20

## 2020-02-17 ENCOUNTER — Telehealth: Payer: Self-pay | Admitting: Nurse Practitioner

## 2020-02-17 NOTE — Telephone Encounter (Signed)
Scheduled appointments per 1/13 los. Called patient, no answer. Left message with appointments dates and times.

## 2020-02-27 ENCOUNTER — Encounter: Payer: Self-pay | Admitting: *Deleted

## 2020-03-01 ENCOUNTER — Other Ambulatory Visit: Payer: Self-pay

## 2020-03-01 ENCOUNTER — Ambulatory Visit
Admission: RE | Admit: 2020-03-01 | Discharge: 2020-03-01 | Disposition: A | Discharge status: Home / Self Care | Payer: BC Managed Care – PPO | Source: Ambulatory Visit | Attending: Hematology | Admitting: Hematology

## 2020-03-01 DIAGNOSIS — N6489 Other specified disorders of breast: Secondary | ICD-10-CM | POA: Diagnosis not present

## 2020-03-01 DIAGNOSIS — C50411 Malignant neoplasm of upper-outer quadrant of right female breast: Secondary | ICD-10-CM

## 2020-03-01 DIAGNOSIS — Z170 Estrogen receptor positive status [ER+]: Secondary | ICD-10-CM

## 2020-03-01 IMAGING — US US BREAST*R* LIMITED INC AXILLA
1 series · 8 of 8 positions shown · non-contrast
Comparison: Previous exam(s).

CLINICAL DATA: Patient underwent a right lumpectomy for breast
carcinoma in [DATE]. A follow-up study demonstrated a questionable
mass in the right breast, which was further assessed with diagnostic
imaging and thought to be a normal intramammary lymph node. She
returns today a follow-up ultrasound, presumably of this presumed
normal lymph node.

EXAM:
ULTRASOUND OF THE RIGHT BREAST

[Series 1: us breast*right* limited inc axilla · 0.06mm/px · 8 of 8 slices shown]
[im 1/8]
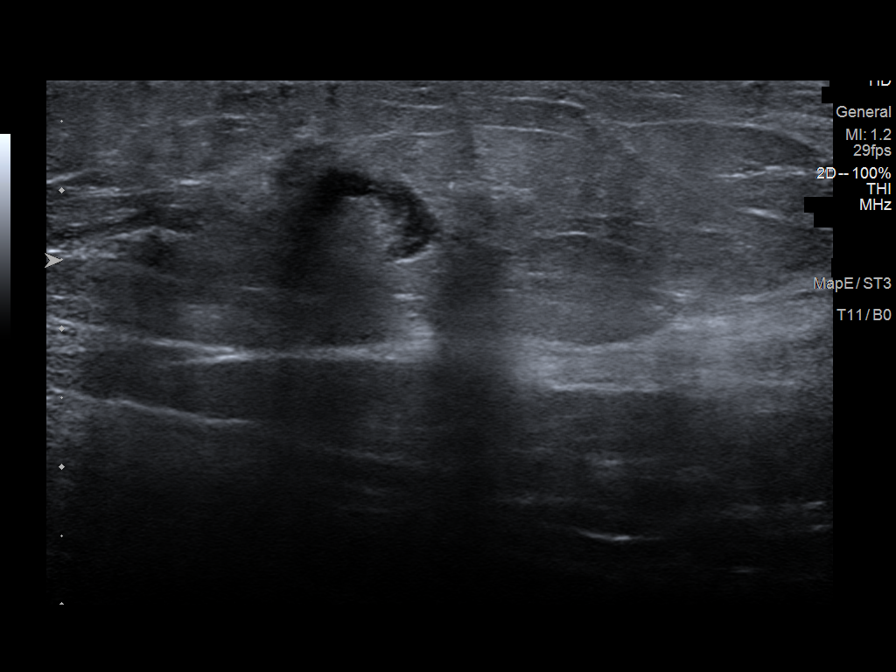
[im 2/8]
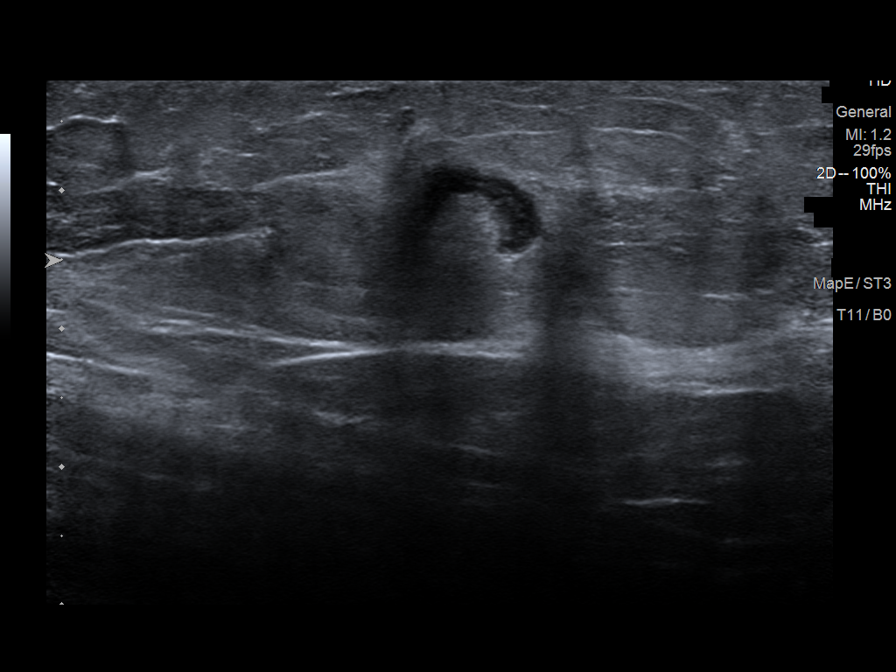
[im 3/8]
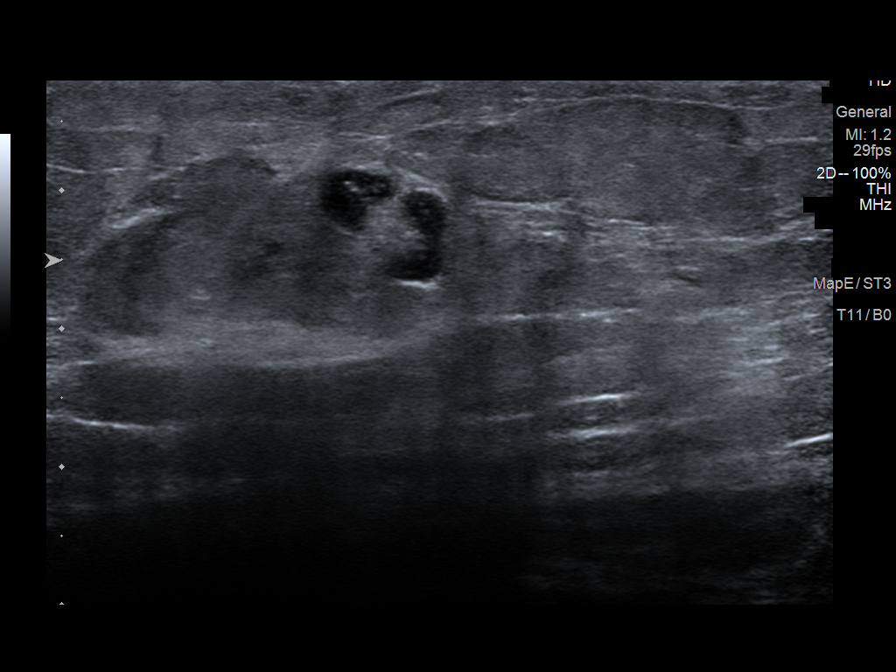
[im 4/8]
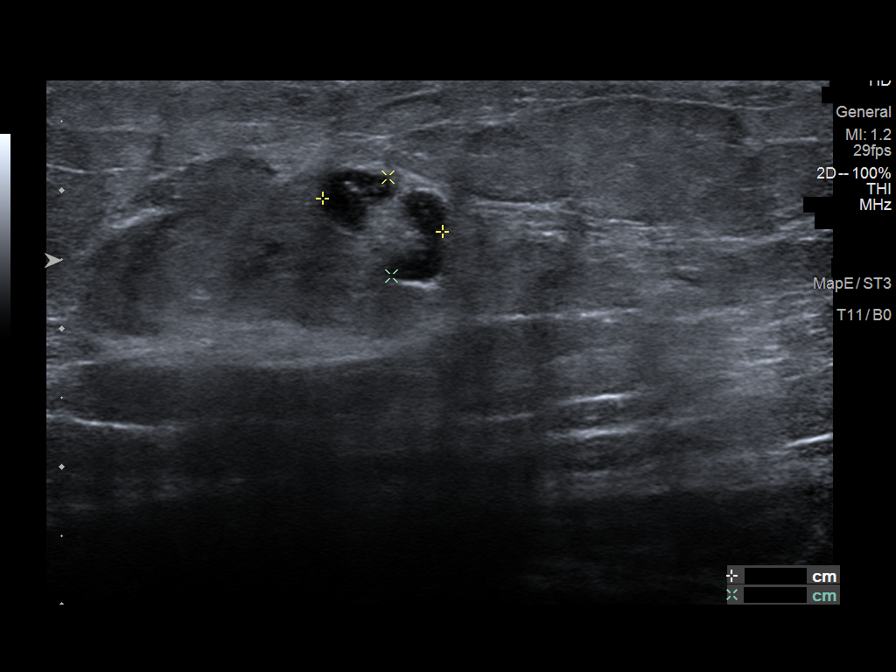
[im 5/8]
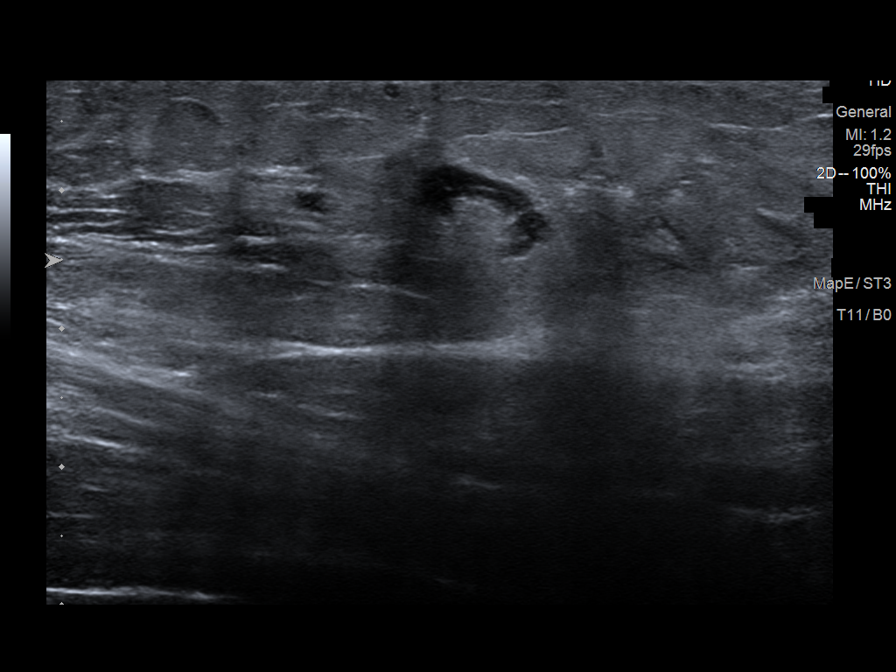
[im 6/8]
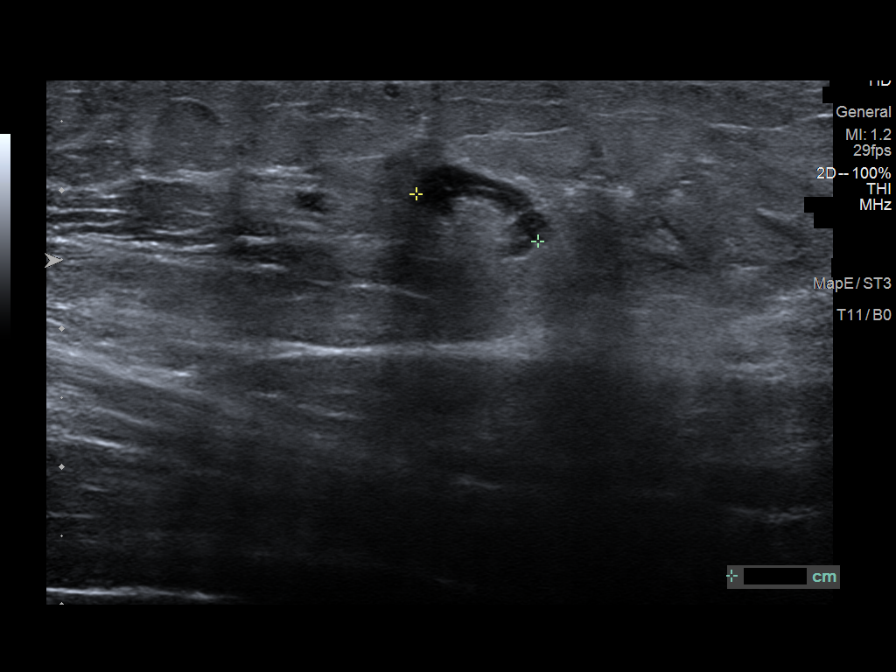
[im 7/8]
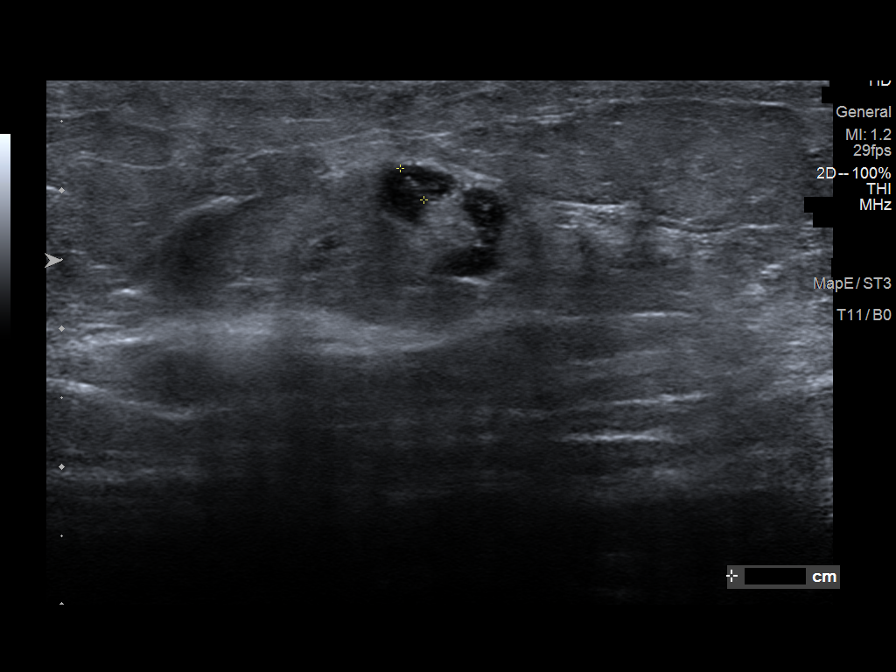
[im 8/8]
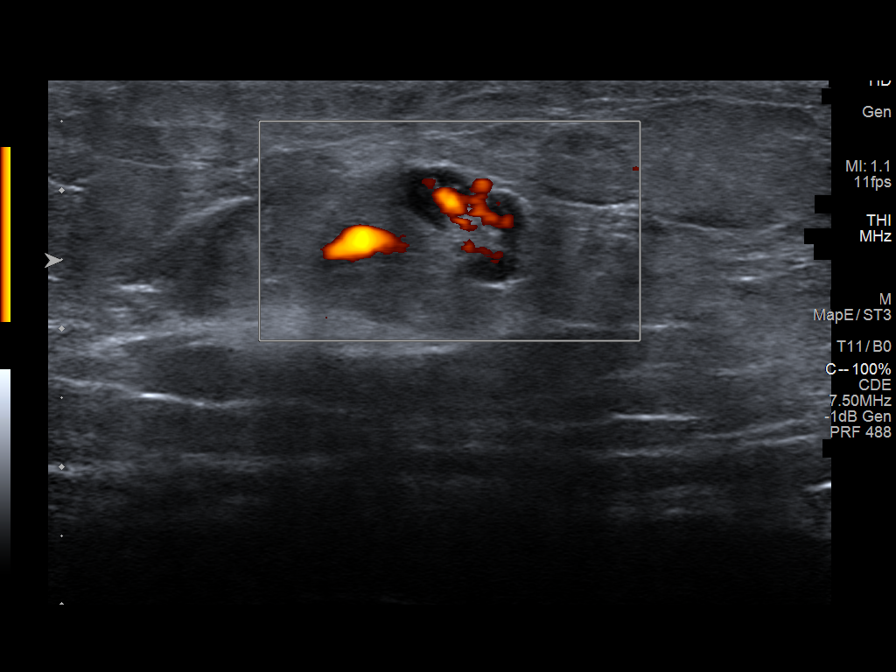

[8 of 8 positions shown; findings below may reference images not displayed]

FINDINGS: Targeted ultrasound is performed, showing a normal intramammary
lymph node in the right breast at 11 o'clock, 10 cm the nipple,
measuring 9 x 7 x 9 mm, maximal cortical thickness of under 3 mm,
with well maintained hilar fat. This corresponds to the possible
abnormality circled on the mammogram dated [DATE].
IMPRESSION: 1. No evidence of breast malignancy.
2. Normal right breast intramammary lymph node at 11 o'clock, 10 cm
the nipple.

RECOMMENDATION:
Diagnostic mammography, for post lumpectomy surveillance, in [DATE] is recommended.

I have discussed the findings and recommendations with the patient.
If applicable, a reminder letter will be sent to the patient
regarding the next appointment.

BI-RADS CATEGORY  1: Negative.

## 2020-03-07 DIAGNOSIS — R067 Sneezing: Secondary | ICD-10-CM

## 2020-03-07 DIAGNOSIS — R0981 Nasal congestion: Secondary | ICD-10-CM

## 2020-03-07 DIAGNOSIS — J029 Acute pharyngitis, unspecified: Secondary | ICD-10-CM | POA: Insufficient documentation

## 2020-03-07 DIAGNOSIS — J069 Acute upper respiratory infection, unspecified: Secondary | ICD-10-CM | POA: Diagnosis not present

## 2020-03-07 DIAGNOSIS — R509 Fever, unspecified: Secondary | ICD-10-CM

## 2020-03-07 HISTORY — DX: Acute pharyngitis, unspecified: J02.9

## 2020-03-07 HISTORY — DX: Sneezing: R06.7

## 2020-03-07 HISTORY — DX: Fever, unspecified: R50.9

## 2020-03-07 HISTORY — DX: Nasal congestion: R09.81

## 2020-03-08 ENCOUNTER — Inpatient Hospital Stay: Payer: BC Managed Care – PPO

## 2020-03-08 ENCOUNTER — Other Ambulatory Visit: Payer: Self-pay

## 2020-03-08 ENCOUNTER — Inpatient Hospital Stay: Payer: BC Managed Care – PPO | Attending: Hematology

## 2020-03-08 VITALS — BP 115/70 | HR 80 | Temp 98.6°F | Resp 17

## 2020-03-08 DIAGNOSIS — E210 Primary hyperparathyroidism: Secondary | ICD-10-CM | POA: Insufficient documentation

## 2020-03-08 DIAGNOSIS — Z1509 Genetic susceptibility to other malignant neoplasm: Secondary | ICD-10-CM

## 2020-03-08 DIAGNOSIS — Z5112 Encounter for antineoplastic immunotherapy: Secondary | ICD-10-CM | POA: Diagnosis not present

## 2020-03-08 DIAGNOSIS — Z170 Estrogen receptor positive status [ER+]: Secondary | ICD-10-CM

## 2020-03-08 DIAGNOSIS — R21 Rash and other nonspecific skin eruption: Secondary | ICD-10-CM | POA: Diagnosis not present

## 2020-03-08 DIAGNOSIS — Z95828 Presence of other vascular implants and grafts: Secondary | ICD-10-CM

## 2020-03-08 DIAGNOSIS — Z1501 Genetic susceptibility to malignant neoplasm of breast: Secondary | ICD-10-CM

## 2020-03-08 DIAGNOSIS — C50411 Malignant neoplasm of upper-outer quadrant of right female breast: Secondary | ICD-10-CM | POA: Diagnosis not present

## 2020-03-08 LAB — CMP (CANCER CENTER ONLY)
ALT: 9 U/L (ref 0–44)
AST: 12 U/L — ABNORMAL LOW (ref 15–41)
Albumin: 3.6 g/dL (ref 3.5–5.0)
Alkaline Phosphatase: 81 U/L (ref 38–126)
Anion gap: 8 (ref 5–15)
BUN: 13 mg/dL (ref 6–20)
CO2: 23 mmol/L (ref 22–32)
Calcium: 9 mg/dL (ref 8.9–10.3)
Chloride: 107 mmol/L (ref 98–111)
Creatinine: 0.98 mg/dL (ref 0.44–1.00)
GFR, Estimated: >60 mL/min (ref >60)
Glucose, Bld: 131 mg/dL — ABNORMAL HIGH (ref 70–99)
Potassium: 4.3 mmol/L (ref 3.5–5.1)
Sodium: 138 mmol/L (ref 135–145)
Total Bilirubin: 0.2 mg/dL — ABNORMAL LOW (ref 0.3–1.2)
Total Protein: 7.3 g/dL (ref 6.5–8.1)

## 2020-03-08 LAB — CBC WITH DIFFERENTIAL (CANCER CENTER ONLY)
Abs Immature Granulocytes: 0.02 10*3/uL (ref 0.00–0.07)
Basophils Absolute: 0.1 10*3/uL (ref 0.0–0.1)
Basophils Relative: 1 %
Eosinophils Absolute: 0.1 10*3/uL (ref 0.0–0.5)
Eosinophils Relative: 1 %
HCT: 43.1 % (ref 36.0–46.0)
Hemoglobin: 14.6 g/dL (ref 12.0–15.0)
Immature Granulocytes: 0 %
Lymphocytes Relative: 12 %
Lymphs Abs: 1 10*3/uL (ref 0.7–4.0)
MCH: 30.7 pg (ref 26.0–34.0)
MCHC: 33.9 g/dL (ref 30.0–36.0)
MCV: 90.7 fL (ref 80.0–100.0)
Monocytes Absolute: 0.2 10*3/uL (ref 0.1–1.0)
Monocytes Relative: 3 %
Neutro Abs: 7.1 10*3/uL (ref 1.7–7.7)
Neutrophils Relative %: 83 %
Platelet Count: 242 10*3/uL (ref 150–400)
RBC: 4.75 MIL/uL (ref 3.87–5.11)
RDW: 13.1 % (ref 11.5–15.5)
WBC Count: 8.4 10*3/uL (ref 4.0–10.5)
nRBC: 0 % (ref 0.0–0.2)

## 2020-03-08 MED ORDER — ACETAMINOPHEN 325 MG PO TABS
ORAL_TABLET | ORAL | Status: AC
  Filled 2020-03-08: qty 2

## 2020-03-08 MED ORDER — DIPHENHYDRAMINE HCL 25 MG PO CAPS
ORAL_CAPSULE | ORAL | Status: AC
  Filled 2020-03-08: qty 2

## 2020-03-08 MED ORDER — DIPHENHYDRAMINE HCL 25 MG PO CAPS
50.0000 mg | ORAL_CAPSULE | Freq: Once | ORAL | Status: AC
  Administered 2020-03-08: 50 mg via ORAL

## 2020-03-08 MED ORDER — ACETAMINOPHEN 325 MG PO TABS
650.0000 mg | ORAL_TABLET | Freq: Once | ORAL | Status: AC
  Administered 2020-03-08: 650 mg via ORAL

## 2020-03-08 MED ORDER — SODIUM CHLORIDE 0.9% FLUSH
10.0000 mL | Freq: Once | INTRAVENOUS | Status: AC
  Administered 2020-03-08: 10 mL
  Filled 2020-03-08: qty 10

## 2020-03-08 MED ORDER — SODIUM CHLORIDE 0.9 % IV SOLN
Freq: Once | INTRAVENOUS | Status: AC
  Filled 2020-03-08: qty 250

## 2020-03-08 MED ORDER — TRASTUZUMAB-ANNS CHEMO 150 MG IV SOLR
6.0000 mg/kg | Freq: Once | INTRAVENOUS | Status: AC
  Administered 2020-03-08: 504 mg via INTRAVENOUS
  Filled 2020-03-08: qty 24

## 2020-03-08 MED ORDER — HEPARIN SOD (PORK) LOCK FLUSH 100 UNIT/ML IV SOLN
500.0000 [IU] | Freq: Once | INTRAVENOUS | Status: AC | PRN
  Administered 2020-03-08: 500 [IU]
  Filled 2020-03-08: qty 5

## 2020-03-08 MED ORDER — SODIUM CHLORIDE 0.9% FLUSH
10.0000 mL | INTRAVENOUS | Status: DC | PRN
  Administered 2020-03-08: 10 mL
  Filled 2020-03-08: qty 10

## 2020-03-12 ENCOUNTER — Encounter: Payer: Self-pay | Admitting: Nurse Practitioner

## 2020-03-28 NOTE — Progress Notes (Signed)
[facility]   Telephone:([phone] Fax:([phone]) [phone]   Clinic Follow up Note   Patient Care Team: Patient, [name] as PCP - General ([place]) Minus Breeding, MD as PCP - Cardiology (Cardiology) Armbruster, Carlota Raspberry, MD as Consulting Physician (Gastroenterology) Eyvonne Mechanic as Counselor (Licensed Clinical Social Worker) Truitt Merle, MD as Consulting Physician (Hematology) Alla Feeling, NP as Nurse Practitioner (Nurse Practitioner) Mauro Kaufmann, RN as Oncology Nurse Navigator Rockwell Germany, RN as Oncology Nurse Navigator Rolm Bookbinder, MD as Consulting Physician (General Surgery)  Date of Service:  03/29/2020  CHIEF COMPLAINT: F/u ofRight breast cancer  SUMMARY OF ONCOLOGIC HISTORY: Oncology History Overview Note  Cancer Staging Malignant neoplasm of upper-outer quadrant of right breast in female, estrogen receptor positive ([place]) Staging form: Breast, AJCC 8th Edition - Clinical stage from 06/01/2019: Stage IA (cT1c, cN0, cM0, G3, ER+, PR-, HER2+) - Signed by Truitt Merle, MD on 06/17/2019 - Pathologic stage from 06/30/2019: Stage IA (pT1c, pN0, cM0, G3, ER+, PR-, HER2+) - Signed by Truitt Merle, MD on 07/15/2019    Malignant neoplasm of upper-outer quadrant of right breast in female, estrogen receptor positive ([place])  10/2018 -  Anti-estrogen oral therapy   She was preventively started on Tamoxifen since 10/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 Held after 06/17/19 to proceed with her breast cancer surgery and chemo.    05/06/2019 Breast MRI   IMPRESSION Enhancing right breast mass suspicious for malignancy. Second look [place] if recommended.  No Suspicious enhancing left breast masses.    05/24/2019 Breast US   IMPRESSION 1.8cm irregular right breast mass within the 9:30 position 3.5cm from the nipple is highly suggestive of malignancy. An Ultrasound guided breast biopsy is recommended. The findings of the study and recommendation for biopsy were  discussed with the patient directly.    06/01/2019 Cancer Staging   Staging form: Breast, AJCC 8th Edition - Clinical stage from 06/01/2019: Stage IA (cT1c, cN0, cM0, G3, ER+, PR-, HER2+) - Signed by Truitt Merle, MD on 06/17/2019   06/01/2019 Initial Biopsy   Breast Biopsy - Right Breast Core Bx DIAGNOSIS INFILTRATING DUCT CARCINOMA WITH MINOR DUCTAL CARCINOMA IN SITU COMPNENT OF RIGHT BREAST, CORE NEEDLE BIOPSY    06/01/2019 Receptors her2   ER: greater than 75% of tumor cells show moderate staining  PR: Negative  HER2: Positive 3+ Ki67: 40%   06/17/2019 Initial Diagnosis   Malignant neoplasm of upper-outer quadrant of right breast in female, estrogen receptor positive ([place])   06/30/2019 Surgery   RIGHT BREAST LUMPECTOMY WITH RADIOACTIVE SEED AND RIGHT AXILLARY SENTINEL NODE BIOPSY and PAC placement  by Dr Donne Hazel     06/30/2019 Pathology Results   FINAL MICROSCOPIC DIAGNOSIS:   A. BREAST, RIGHT, LUMPECTOMY:  - Invasive ductal carcinoma, grade 3, spanning 1.5 cm.  - Intermediate grade ductal carcinoma in situ.  - Invasive carcinoma is 0.1-0.2 from the final lateral margin (part E).  - Margins are negative for in situ carcinoma.  - Biopsy site.  - See oncology table.   B. LYMPH NODE, RIGHT AXILLARY, SENTINEL, BIOPSY:  - One of one lymph nodes negative for carcinoma (0/1).   C. LYMPH NODE, RIGHT AXILLARY, SENTINEL, BIOPSY:  - One of one lymph nodes negative for carcinoma (0/1).   D. LYMPH NODE, RIGHT AXILLARY, SENTINEL, BIOPSY:  - One of one lymph nodes negative for carcinoma (0/1).   E. BREAST, RIGHT ADDITIONAL LATERAL MARGIN, EXCISION:  - Invasive ductal carcinoma.  - Invasive carcinoma is  0.1-0.2 cm from the new lateral margin.   F. BREAST, RIGHT ADDITIONAL POSTERIOR MARGIN, EXCISION:  - Fibrocystic change and usual ductal hyperplasia.  - No malignancy identified.   G. BREAST, RIGHT ADDITIONAL SUPERIOR MARGIN, EXCISION:  - Fibrocystic change and usual ductal  hyperplasia.  - Incidental radial scar.  - No malignancy identified.     PROGNOSTIC INDICATOR RESULTS:  The tumor cells are POSITIVE for Her2 (3+).  Estrogen Receptor:       POSITIVE, 85%, MODERATE STAINING  Progesterone Receptor:   NEGATIVE  Proliferation Marker Ki-67:   25%    06/30/2019 Cancer Staging   Staging form: Breast, AJCC 8th Edition - Pathologic stage from 06/30/2019: Stage IA (pT1c, pN0, cM0, G3, ER+, PR-, HER2+) - Signed by Truitt Merle, MD on 07/15/2019   07/29/2019 -  Chemotherapy   Adjuvant Weekly Abraxane (Due to her insurance denial of Abraxane, I changed it to paclitaxel starting 09/15/19) and Transtuzumab for 12 weeks starting 07/29/19-10/12/19, followed by maintenance Herceptin q3weeks starting 11/03/19 to complete 1 year of treatment. Due to headaches her Herceptin (Ogivr) was switched to Herceptin Calla Kicks) on 09/08/19.     12/2019 - 01/2020 Radiation Therapy   Outside Radiation in late 2021.       CURRENT THERAPY:  Maintenance Herceptin(Kanjinti)q3weeksstarting 9/30/21to complete 1 year of treatmentfrom 07/29/19, in 07/2020.   INTERVAL HISTORY:  Abigail Berg is here for a follow up. She presents to the clinic with a female friend.  She noticed right breast warmness, tender, itchy and tingling about 5-6 days ago, no wound, fever or chills.  She subsequently broke out skin rash on her arms, and chest , with severe itchiness.  She saw her rad/onc 3 days ago and been on oral Benadryl and cipro for 2 days, it has helped some, itchiness are much improved. Tolerating Tamoxifen well overall, mild hot flushes, no joint pain or other complains      All other systems were reviewed with the patient and are negative.  MEDICAL HISTORY:  Past Medical History:  Diagnosis Date  . Cancer [facility])    right breast  . Depression   . Family history of colon cancer   . History of kidney stones   . Monoallelic mutation of CHEK2 gene in female patient   . Personal history of colonic  polyps   . Pneumonia   . S/P partial colectomy     SURGICAL HISTORY: Past Surgical History:  Procedure Laterality Date  . BREAST LUMPECTOMY WITH RADIOACTIVE SEED AND SENTINEL LYMPH NODE BIOPSY Right 06/30/2019   Procedure: RIGHT BREAST LUMPECTOMY WITH RADIOACTIVE SEED AND RIGHT AXILLARY SENTINEL NODE BIOPSY;  Surgeon: Rolm Bookbinder, MD;  Location: [facility];  Service: General;  Laterality: Right;  PEC BLOCK  . BREAST SURGERY    . COLON SURGERY     Partial colectomy  . COLONOSCOPY    . PARATHYROIDECTOMY Left 01/25/2020   Procedure: LEFT INFERIOR PARATHYROIDECTOMY;  Surgeon: Armandina Gemma, MD;  [facility];  Service: General;  Laterality: Left;  . POLYPECTOMY    . PORTACATH PLACEMENT Left 06/30/2019   Procedure: INSERTION PORT-A-CATH WITH ULTRASOUND GUIDANCE;  Surgeon: Rolm Bookbinder, MD;  Location: [facility];  Service: General;  Laterality: Left;  Marland Kitchen VENTRAL HERNIA REPAIR N/A 01/25/2020   Procedure: HERNIA REPAIR VENTRAL ADULT WITH MESH;  Surgeon: Armandina Gemma, MD;  Location[facility];  Service: General;  Laterality: N/A;    I have reviewed the social history and family history with the patient and  they are unchanged from previous note.  ALLERGIES:  has No Known Allergies.  MEDICATIONS:  Current Outpatient Medications  Medication Sig Dispense Refill  . Cholecalciferol (VITAMIN D) 50 MCG (2000 UT) tablet Take 2,000 Units by mouth daily.    Marland Kitchen escitalopram (LEXAPRO) 20 MG tablet Take 20 mg by mouth daily.     Marland Kitchen gabapentin (NEURONTIN) 300 MG capsule Take 1 capsule (300 mg total) by mouth 3 (three) times daily. 90 capsule 3  . HYDROcodone-acetaminophen (NORCO/VICODIN) 5-325 MG tablet Take 1-2 tablets by mouth every 6 (six) hours as needed for moderate pain. 20 tablet 0  . ibuprofen (ADVIL) 200 MG tablet Take 600 mg by mouth every 6 (six) hours as needed for headache or mild pain.    Marland Kitchen lidocaine-prilocaine (EMLA) cream Apply to affected area once  (Patient taking differently: Apply 1 application topically daily as needed (port access).) 30 g 3  . tamoxifen (NOLVADEX) 20 MG tablet Take 1 tablet (20 mg total) by mouth daily. 90 tablet 3   No current facility-administered medications for this visit.   Facility-Administered Medications Ordered in Other Visits  Medication Dose Route Frequency Provider Last Rate Last Admin  . sodium chloride flush (NS) 0.9 % injection 10 mL  10 mL Intracatheter PRN Truitt Merle, MD   10 mL at 03/29/20 1619    PHYSICAL EXAMINATION: ECOG PERFORMANCE STATUS: 1 - Symptomatic but completely ambulatory  Vitals:   03/29/20 1343  BP: 131/69  Pulse: 79  Resp: 16  Temp: 98.2 F (36.8 C)  SpO2: 100%   Filed Weights   03/29/20 1343  Weight: 199 lb 6.4 oz (90.4 kg)    GENERAL:alert, no distress and comfortable SKIN: skin color, texture, turgor are normal, (+) diffuse small papular rashes on her chest, upper abdomen and bilateral arms EYES: normal, Conjunctiva are pink and non-injected, sclera clear NECK: supple, thyroid normal size, non-tender, without nodularity LYMPH:  no palpable lymphadenopathy in the cervical, axillary  LUNGS: clear to auscultation and percussion with normal breathing effort HEART: regular rate & rhythm and no murmurs and no lower extremity edema ABDOMEN:abdomen soft, non-tender and normal bowel sounds Musculoskeletal:no cyanosis of digits and no clubbing  NEURO: alert & oriented x 3 with fluent speech, no focal motor/sensory deficits Breasts: Breast inspection showed them to be symmetrical with no nipple discharge. (+) Mild to moderate lymphedema of right breast, with diffuse skin hyperpigmentation, no tenderness. Palpation of the breasts and axilla revealed no obvious mass that I could appreciate.   LABORATORY DATA:  I have reviewed the data as listed CBC Latest Ref Rng & Units 03/29/2020 03/08/2020 02/16/2020  WBC 4.0 - 10.5 K/uL 5.4 8.4 8.3  Hemoglobin 12.0 - 15.0 g/dL 13.4 14.6  14.3  Hematocrit 36.0 - 46.0 % 38.5 43.1 41.2  Platelets 150 - 400 K/uL 236 242 241     CMP Latest Ref Rng & Units 03/29/2020 03/08/2020 02/16/2020  Glucose 70 - 99 mg/dL 92 131(H) 97  BUN 6 - 20 mg/dL $Remove'14 13 11  'OanULsr$ Creatinine 0.44 - 1.00 mg/dL 0.89 0.98 0.83  Sodium 135 - 145 mmol/L 140 138 141  Potassium 3.5 - 5.1 mmol/L 3.8 4.3 3.7  Chloride 98 - 111 mmol/L 109 107 109  CO2 22 - 32 mmol/L $RemoveB'23 23 25  'rbXxdoKf$ Calcium 8.9 - 10.3 mg/dL 8.8(L) 9.0 9.0  Total Protein 6.5 - 8.1 g/dL 6.6 7.3 6.9  Total Bilirubin 0.3 - 1.2 mg/dL 0.2(L) 0.2(L) 0.4  Alkaline Phos 38 - 126 U/L 68 81 83  AST 15 - 41 U/L 13(L) 12(L) 11(L)  ALT 0 - 44 U/L $Remo'9 9 7      'Uhsde$ RADIOGRAPHIC STUDIES: I have personally reviewed the radiological images as listed and agreed with the findings in the report. No results found.   ASSESSMENT & PLAN:  Maiko Salais is a 55 y.o. female with   1.Malignant neoplasm of upper-outer quadrant of right breast,Stage1A,p(T1cN0M0),stage IA,ER+/PR-/HER2+, Grade3. -She was diagnosed in 05/2019.She is s/pright breast lumpectomy and adjuvant radiation at local.  -Given her high risk HER2 positive breast cancer,shecompletedchemotherapy withweeklyAbraxane and Herceptin6/25/21-10/12/19.She started maintenance Hereptin (Kanjinti) on 11/03/19 to complete 1 year of therapy.  -Her outside Mammogram showed a 1 cm mass in the upper outer quadrant of the right breast, 11 cm from nipple, ultrasound showed likely benign lesion. Repeated US on 03/01/20 showed NED.   -Continue trastuzumab every 3 weeks until June 2022 -She has started adjuvant tamoxifen in October 2021, tolerating well overall with mild hot flashes. -She is clinically doing well overall, has developed diffuse skin rash in upper body in the past week, improved with antibiotics and Benadryl.  Breast exam was unremarkable -Lab reviewed, adequate for trastuzumab, will proceed today and continue every 3 weeks for 5 more treatments -Follow-up in 6  weeks   2. Diffuse skin rashes  -Started in right breast 5 days ago, subsequently broke out diffusely in upper body  -Likely allergy reaction, not sure what caused it. I am less concerned about right breast cellulitis since the rashes are much more diffuse  -She will continue oral Benadryl and Cipro, rash and symptom has improved on these meds     3. 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 -She will f/u with Dr. Havery Moros for screening colonoscopies, last done 07/2018 with12polyps. I recommend to repeat in next 2 years.  -Shewaspreviouslyonchemoprevention withTamoxifen 10/2018-06/17/19,plan to restart after she completes adjuvant radiation.   4. S/p ileocecectomy on 08/14/15 due to benign colon polyp invading appendix, history of recurrent colon polyps  5.H/o right breast fibroadenoma,S/p rightsurgical resection on 11/22/15  6. Primary hyperparathyroidism -She underwent left inferior parathyroidectomy with Dr Harlow Asa on 01/25/20. Her right hip/thigh pain resolved after surgery  -she is doing well, calcium level back to normal now    PLAN: -Lab reviewed, adequate for treatment, will proceed trastuzumab today, and continue every 3 weeks for 5 more doses -She was continue Benadryl and Cipro for her skin rashes -Follow-up in 6 weeks with screening breast MRI at [facility] on same day  No problem-specific Assessment & Plan notes found for this encounter.   Orders Placed This Encounter  Procedures  . MR BREAST BILATERAL W WO CONTRAST INC CAD    Standing Status:   Future    Standing Expiration Date:   03/29/2021    Order Specific Question:   If indicated for the ordered procedure, I authorize the administration of contrast media per Radiology protocol    Answer:   Yes    Order Specific Question:   What is  the patient's sedation requirement?    Answer:   No Sedation    Order Specific Question:   Does the patient have a pacemaker or implanted devices?    Answer:   No    Order Specific Question:   Radiology Contrast Protocol - do NOT remove file path    Answer:   [email or web address]  Order Specific Question:   Preferred imaging location?    Answer:   [facility] (table limit - 550 lbs)   All questions were answered. The patient knows to call the clinic with any problems, questions or concerns. No barriers to learning was detected. The total time spent in the appointment was 30 minutes.     Truitt Merle, MD 03/29/2020   I, Joslyn Devon, am acting as scribe for Truitt Merle, MD.   I have reviewed the above documentation for accuracy and completeness, and I agree with the above.

## 2020-03-29 ENCOUNTER — Inpatient Hospital Stay: Payer: BC Managed Care – PPO

## 2020-03-29 ENCOUNTER — Inpatient Hospital Stay: Payer: BC Managed Care – PPO | Admitting: Hematology

## 2020-03-29 ENCOUNTER — Other Ambulatory Visit: Payer: Self-pay | Admitting: Hematology

## 2020-03-29 ENCOUNTER — Other Ambulatory Visit: Payer: Self-pay

## 2020-03-29 ENCOUNTER — Encounter: Payer: Self-pay | Admitting: Hematology

## 2020-03-29 VITALS — BP 131/69 | HR 79 | Temp 98.2°F | Resp 16 | Ht 69.0 in | Wt 199.4 lb

## 2020-03-29 DIAGNOSIS — Z5112 Encounter for antineoplastic immunotherapy: Secondary | ICD-10-CM | POA: Diagnosis not present

## 2020-03-29 DIAGNOSIS — Z1589 Genetic susceptibility to other disease: Secondary | ICD-10-CM

## 2020-03-29 DIAGNOSIS — C50411 Malignant neoplasm of upper-outer quadrant of right female breast: Secondary | ICD-10-CM

## 2020-03-29 DIAGNOSIS — Z170 Estrogen receptor positive status [ER+]: Secondary | ICD-10-CM

## 2020-03-29 DIAGNOSIS — R21 Rash and other nonspecific skin eruption: Secondary | ICD-10-CM | POA: Diagnosis not present

## 2020-03-29 DIAGNOSIS — E210 Primary hyperparathyroidism: Secondary | ICD-10-CM | POA: Diagnosis not present

## 2020-03-29 DIAGNOSIS — Z1501 Genetic susceptibility to malignant neoplasm of breast: Secondary | ICD-10-CM

## 2020-03-29 DIAGNOSIS — Z95828 Presence of other vascular implants and grafts: Secondary | ICD-10-CM

## 2020-03-29 LAB — CBC WITH DIFFERENTIAL (CANCER CENTER ONLY)
Abs Immature Granulocytes: 0.01 10*3/uL (ref 0.00–0.07)
Basophils Absolute: 0 10*3/uL (ref 0.0–0.1)
Basophils Relative: 1 %
Eosinophils Absolute: 0.4 10*3/uL (ref 0.0–0.5)
Eosinophils Relative: 7 %
HCT: 38.5 % (ref 36.0–46.0)
Hemoglobin: 13.4 g/dL (ref 12.0–15.0)
Immature Granulocytes: 0 %
Lymphocytes Relative: 29 %
Lymphs Abs: 1.6 10*3/uL (ref 0.7–4.0)
MCH: 31.2 pg (ref 26.0–34.0)
MCHC: 34.8 g/dL (ref 30.0–36.0)
MCV: 89.5 fL (ref 80.0–100.0)
Monocytes Absolute: 0.4 10*3/uL (ref 0.1–1.0)
Monocytes Relative: 8 %
Neutro Abs: 3 10*3/uL (ref 1.7–7.7)
Neutrophils Relative %: 55 %
Platelet Count: 236 10*3/uL (ref 150–400)
RBC: 4.3 MIL/uL (ref 3.87–5.11)
RDW: 13.7 % (ref 11.5–15.5)
WBC Count: 5.4 10*3/uL (ref 4.0–10.5)
nRBC: 0 % (ref 0.0–0.2)

## 2020-03-29 LAB — CMP (CANCER CENTER ONLY)
ALT: 9 U/L (ref 0–44)
AST: 13 U/L — ABNORMAL LOW (ref 15–41)
Albumin: 3.5 g/dL (ref 3.5–5.0)
Alkaline Phosphatase: 68 U/L (ref 38–126)
Anion gap: 8 (ref 5–15)
BUN: 14 mg/dL (ref 6–20)
CO2: 23 mmol/L (ref 22–32)
Calcium: 8.8 mg/dL — ABNORMAL LOW (ref 8.9–10.3)
Chloride: 109 mmol/L (ref 98–111)
Creatinine: 0.89 mg/dL (ref 0.44–1.00)
GFR, Estimated: >60 mL/min (ref >60)
Glucose, Bld: 92 mg/dL (ref 70–99)
Potassium: 3.8 mmol/L (ref 3.5–5.1)
Sodium: 140 mmol/L (ref 135–145)
Total Bilirubin: 0.2 mg/dL — ABNORMAL LOW (ref 0.3–1.2)
Total Protein: 6.6 g/dL (ref 6.5–8.1)

## 2020-03-29 MED ORDER — HEPARIN SOD (PORK) LOCK FLUSH 100 UNIT/ML IV SOLN
500.0000 [IU] | Freq: Once | INTRAVENOUS | Status: AC | PRN
  Administered 2020-03-29: 500 [IU]
  Filled 2020-03-29: qty 5

## 2020-03-29 MED ORDER — DIPHENHYDRAMINE HCL 25 MG PO CAPS
50.0000 mg | ORAL_CAPSULE | Freq: Once | ORAL | Status: AC
  Administered 2020-03-29: 50 mg via ORAL

## 2020-03-29 MED ORDER — SODIUM CHLORIDE 0.9% FLUSH
10.0000 mL | Freq: Once | INTRAVENOUS | Status: AC
Start: 2020-03-29 — End: 2020-03-29
  Administered 2020-03-29: 10 mL
  Filled 2020-03-29: qty 10

## 2020-03-29 MED ORDER — DIPHENHYDRAMINE HCL 25 MG PO CAPS
ORAL_CAPSULE | ORAL | Status: AC
  Filled 2020-03-29: qty 2

## 2020-03-29 MED ORDER — TRASTUZUMAB-ANNS CHEMO 150 MG IV SOLR
6.0000 mg/kg | Freq: Once | INTRAVENOUS | Status: AC
  Administered 2020-03-29: 504 mg via INTRAVENOUS
  Filled 2020-03-29: qty 24

## 2020-03-29 MED ORDER — ACETAMINOPHEN 325 MG PO TABS
650.0000 mg | ORAL_TABLET | Freq: Once | ORAL | Status: AC
  Administered 2020-03-29: 650 mg via ORAL

## 2020-03-29 MED ORDER — ACETAMINOPHEN 325 MG PO TABS
ORAL_TABLET | ORAL | Status: AC
  Filled 2020-03-29: qty 2

## 2020-03-29 MED ORDER — SODIUM CHLORIDE 0.9 % IV SOLN
Freq: Once | INTRAVENOUS | Status: AC
  Filled 2020-03-29: qty 250

## 2020-03-29 MED ORDER — SODIUM CHLORIDE 0.9% FLUSH
10.0000 mL | INTRAVENOUS | Status: DC | PRN
  Administered 2020-03-29: 10 mL
  Filled 2020-03-29: qty 10

## 2020-03-29 NOTE — Patient Instructions (Signed)
[facility] Discharge Instructions for Patients Receiving Chemotherapy  Today you received the following immunotherapy agent: Trastuzumab  To help prevent nausea and vomiting after your treatment, we encourage you to take your nausea medication as directed by your MD.   If you develop nausea and vomiting that is not controlled by your nausea medication, call the clinic.   BELOW ARE SYMPTOMS THAT SHOULD BE REPORTED IMMEDIATELY:  *FEVER GREATER THAN 100.5 F  *CHILLS WITH OR WITHOUT FEVER  NAUSEA AND VOMITING THAT IS NOT CONTROLLED WITH YOUR NAUSEA MEDICATION  *UNUSUAL SHORTNESS OF BREATH  *UNUSUAL BRUISING OR BLEEDING  TENDERNESS IN MOUTH AND THROAT WITH OR WITHOUT PRESENCE OF ULCERS  *URINARY PROBLEMS  *BOWEL PROBLEMS  UNUSUAL RASH Items with * indicate a potential emergency and should be followed up as soon as possible.  Feel free to call the clinic should you have any questions or concerns. The clinic phone number is [phone].  Please show the CHEMO ALERT CARD at check-in to the Emergency Department and triage nurse.   

## 2020-03-30 ENCOUNTER — Telehealth: Payer: Self-pay | Admitting: Hematology

## 2020-03-30 NOTE — Telephone Encounter (Signed)
Scheduled follow-up appointments per 2/24 los. Patient is aware. 

## 2020-04-05 ENCOUNTER — Encounter: Payer: Self-pay | Admitting: *Deleted

## 2020-04-11 DIAGNOSIS — C50411 Malignant neoplasm of upper-outer quadrant of right female breast: Secondary | ICD-10-CM | POA: Diagnosis not present

## 2020-04-16 ENCOUNTER — Ambulatory Visit: Payer: BC Managed Care – PPO | Attending: General Surgery

## 2020-04-16 ENCOUNTER — Other Ambulatory Visit: Payer: Self-pay

## 2020-04-16 DIAGNOSIS — C50411 Malignant neoplasm of upper-outer quadrant of right female breast: Secondary | ICD-10-CM | POA: Insufficient documentation

## 2020-04-16 DIAGNOSIS — Z170 Estrogen receptor positive status [ER+]: Secondary | ICD-10-CM | POA: Insufficient documentation

## 2020-04-16 NOTE — Therapy (Signed)
[place], [place], [address] Phone: [phone]   Fax:  [phone]  Physical Therapy Treatment  Patient Details  Name: Abigail Berg MRN: [id number] Date of Birth: Dec 08, 1965 Referring Provider (PT): Donne Hazel   Encounter Date: 04/16/2020   PT End of Session - 04/16/20 1633    Visit Number 2   # unchanged due to screen only   Number of Visits 2    Date for PT Re-Evaluation 07/26/19    PT Start Time 1619    PT Stop Time 1632    PT Time Calculation (min) 13 min    Activity Tolerance Patient tolerated treatment well    Behavior During Therapy [facility] for tasks assessed/performed           Past Medical History:  Diagnosis Date  . Cancer [facility])    right breast  . Depression   . Family history of colon cancer   . History of kidney stones   . Monoallelic mutation of CHEK2 gene in female patient   . Personal history of colonic polyps   . Pneumonia   . S/P partial colectomy     Past Surgical History:  Procedure Laterality Date  . BREAST LUMPECTOMY WITH RADIOACTIVE SEED AND SENTINEL LYMPH NODE BIOPSY Right 06/30/2019   Procedure: RIGHT BREAST LUMPECTOMY WITH RADIOACTIVE SEED AND RIGHT AXILLARY SENTINEL NODE BIOPSY;  Surgeon: Rolm Bookbinder, MD;  Location: [place];  Service: General;  Laterality: Right;  PEC BLOCK  . BREAST SURGERY    . COLON SURGERY     Partial colectomy  . COLONOSCOPY    . PARATHYROIDECTOMY Left 01/25/2020   Procedure: LEFT INFERIOR PARATHYROIDECTOMY;  Surgeon: Armandina Gemma, MD;  [facility];  Service: General;  Laterality: Left;  . POLYPECTOMY    . PORTACATH PLACEMENT Left 06/30/2019   Procedure: INSERTION PORT-A-CATH WITH ULTRASOUND GUIDANCE;  Surgeon: Rolm Bookbinder, MD;  Location: [facility];  Service: General;  Laterality: Left;  Marland Kitchen VENTRAL HERNIA REPAIR N/A 01/25/2020   Procedure: HERNIA REPAIR VENTRAL ADULT WITH MESH;  Surgeon: Armandina Gemma, MD;  [facility];  Service: General;  Laterality: N/A;    There were no vitals filed for this visit.   Subjective Assessment - 04/16/20 1622    Subjective Pt returns for 3 month L-Dex screen. I had a strange reaction recently where I broke out on my arms and my Rt breast swelled up real bad and was very itchy. My radiation doctor put me on an antibiotic and that helped some but my breast is still full around to my side, and the inside of my breast feelsfirm and almost leathery since then.    Pertinent History R breast cancer, ER +, PR -, HER 2+, will undergo R breast lumpectomy and SLNB on 06/30/19, stopped Tamoxifen a week ago                  L-DEX FLOWSHEETS - 04/16/20 1600      L-DEX LYMPHEDEMA SCREENING   Measurement Type Unilateral    L-DEX MEASUREMENT EXTREMITY Upper Extremity    POSITION  Standing    DOMINANT SIDE Right    At Risk Side Right    BASELINE SCORE (UNILATERAL) -0.3    L-DEX SCORE (UNILATERAL) -0.9    VALUE CHANGE (UNILAT) -0.6  PT Long Term Goals - 07/28/19 1157      PT LONG TERM GOAL #1   Title Pt will return to baseline shoulder ROM following R breast lumpectomy.    Time 4    Period Weeks    Status Achieved                 Plan - 04/16/20 1634    Clinical Impression Statement Pt returns for her 3 month L-Dex screen. Her change from baseline of -0.6 is WNLs, however she repots hew onset of breast swelling since completing radiation before Thanksgiving and then having a strange reaction with ictching on her arms and breast and her breast swelled even more. She reports now being left with, after completing an antibiotic, her breast still feeling full to her lateral trunk and reports medial aspect of breast feeling "leathery". Denies any skin changes like peau de l'orange. Educated pt that it sounds like she has breast lymphedema and that we can treat that. Pt is interested but  reports not wanting to start right away as she is almost finished with herceptin and then all appts will be completed and she just wants no appts for awhile. Educated her to keep a close watch on her breast and if it worsens at all over next month before she sess her radiation oncologist for a f/u (and may choose to come then if they agree) to please let us or her doctor know so we can begin treatment ASAP for breast lymphedema becuase if left untreated it could become worse. Pt verbalizes understanding.    PT Next Visit Plan Cont every 3 month L-Dex screens.    Consulted and Agree with Plan of Care Patient           Patient will benefit from skilled therapeutic intervention in order to improve the following deficits and impairments:     Visit Diagnosis: Malignant neoplasm of upper-outer quadrant of right breast in female, estrogen receptor positive ([place])     Problem List Patient Active Problem List   Diagnosis Date Noted  . Ventral hernia 01/21/2020  . Vitamin D deficiency 10/27/2019  . Hyperparathyroidism ([place]) 10/25/2019  . Port-A-Cath in place 07/29/2019  . Malignant neoplasm of upper-outer quadrant of right breast in female, estrogen receptor positive ([place]) 06/17/2019  . Genetic testing 09/27/2018  . Monoallelic mutation of CHEK2 gene in female patient   . Family history of colon cancer   . Personal history of colonic polyps     Otelia Limes, PTA 04/16/2020, 4:40 PM  [place], [place], [address] Phone: [phone]   Fax:  [phone]  Name: Abigail Berg MRN: [id number] Date of Birth: November 19, 1965

## 2020-04-19 ENCOUNTER — Other Ambulatory Visit: Payer: Self-pay

## 2020-04-19 ENCOUNTER — Inpatient Hospital Stay: Payer: BC Managed Care – PPO | Attending: Hematology

## 2020-04-19 ENCOUNTER — Other Ambulatory Visit: Payer: Self-pay | Admitting: Hematology

## 2020-04-19 VITALS — BP 117/66 | HR 85 | Temp 97.7°F | Resp 18 | Ht 69.0 in | Wt 199.0 lb

## 2020-04-19 DIAGNOSIS — Z5112 Encounter for antineoplastic immunotherapy: Secondary | ICD-10-CM | POA: Insufficient documentation

## 2020-04-19 DIAGNOSIS — Z170 Estrogen receptor positive status [ER+]: Secondary | ICD-10-CM

## 2020-04-19 DIAGNOSIS — C50411 Malignant neoplasm of upper-outer quadrant of right female breast: Secondary | ICD-10-CM | POA: Diagnosis not present

## 2020-04-19 MED ORDER — SODIUM CHLORIDE 0.9 % IV SOLN
Freq: Once | INTRAVENOUS | Status: AC
  Filled 2020-04-19: qty 250

## 2020-04-19 MED ORDER — HEPARIN SOD (PORK) LOCK FLUSH 100 UNIT/ML IV SOLN
500.0000 [IU] | Freq: Once | INTRAVENOUS | Status: AC | PRN
  Administered 2020-04-19: 500 [IU]
  Filled 2020-04-19: qty 5

## 2020-04-19 MED ORDER — ACETAMINOPHEN 325 MG PO TABS
ORAL_TABLET | ORAL | Status: AC
  Filled 2020-04-19: qty 2

## 2020-04-19 MED ORDER — ACETAMINOPHEN 325 MG PO TABS
650.0000 mg | ORAL_TABLET | Freq: Once | ORAL | Status: AC
  Administered 2020-04-19: 650 mg via ORAL

## 2020-04-19 MED ORDER — SODIUM CHLORIDE 0.9% FLUSH
10.0000 mL | INTRAVENOUS | Status: DC | PRN
  Administered 2020-04-19: 10 mL
  Filled 2020-04-19: qty 10

## 2020-04-19 MED ORDER — DIPHENHYDRAMINE HCL 25 MG PO CAPS
ORAL_CAPSULE | ORAL | Status: AC
  Filled 2020-04-19: qty 2

## 2020-04-19 MED ORDER — TRASTUZUMAB-ANNS CHEMO 150 MG IV SOLR
6.0000 mg/kg | Freq: Once | INTRAVENOUS | Status: AC
  Administered 2020-04-19: 504 mg via INTRAVENOUS
  Filled 2020-04-19: qty 24

## 2020-04-19 MED ORDER — DIPHENHYDRAMINE HCL 25 MG PO CAPS
50.0000 mg | ORAL_CAPSULE | Freq: Four times a day (QID) | ORAL | Status: DC | PRN
  Administered 2020-04-19: 50 mg via ORAL

## 2020-04-19 NOTE — Patient Instructions (Signed)
[facility] Discharge Instructions for Patients Receiving Chemotherapy  Today you received the following chemotherapy agents Trastuzumab-anns (KANJINTI).  To help prevent nausea and vomiting after your treatment, we encourage you to take your nausea medication as prescribed.   If you develop nausea and vomiting that is not controlled by your nausea medication, call the clinic.   BELOW ARE SYMPTOMS THAT SHOULD BE REPORTED IMMEDIATELY:  *FEVER GREATER THAN 100.5 F  *CHILLS WITH OR WITHOUT FEVER  NAUSEA AND VOMITING THAT IS NOT CONTROLLED WITH YOUR NAUSEA MEDICATION  *UNUSUAL SHORTNESS OF BREATH  *UNUSUAL BRUISING OR BLEEDING  TENDERNESS IN MOUTH AND THROAT WITH OR WITHOUT PRESENCE OF ULCERS  *URINARY PROBLEMS  *BOWEL PROBLEMS  UNUSUAL RASH Items with * indicate a potential emergency and should be followed up as soon as possible.  Feel free to call the clinic should you have any questions or concerns. The clinic phone number is [phone].  Please show the CHEMO ALERT CARD at check-in to the Emergency Department and triage nurse.   

## 2020-05-10 ENCOUNTER — Other Ambulatory Visit (HOSPITAL_COMMUNITY): Payer: BC Managed Care – PPO

## 2020-05-10 ENCOUNTER — Ambulatory Visit (HOSPITAL_COMMUNITY)
Admission: RE | Admit: 2020-05-10 | Discharge: 2020-05-10 | Disposition: A | Discharge status: Home / Self Care | Payer: BC Managed Care – PPO | Source: Ambulatory Visit | Attending: Hematology | Admitting: Hematology

## 2020-05-10 ENCOUNTER — Other Ambulatory Visit: Payer: Self-pay

## 2020-05-10 ENCOUNTER — Inpatient Hospital Stay: Payer: BC Managed Care – PPO | Attending: Hematology

## 2020-05-10 ENCOUNTER — Inpatient Hospital Stay (HOSPITAL_BASED_OUTPATIENT_CLINIC_OR_DEPARTMENT_OTHER): Payer: BC Managed Care – PPO | Admitting: Hematology

## 2020-05-10 VITALS — BP 126/83 | HR 82 | Temp 97.4°F | Resp 20 | Ht 69.0 in | Wt 197.8 lb

## 2020-05-10 DIAGNOSIS — E210 Primary hyperparathyroidism: Secondary | ICD-10-CM | POA: Diagnosis not present

## 2020-05-10 DIAGNOSIS — Z170 Estrogen receptor positive status [ER+]: Secondary | ICD-10-CM | POA: Diagnosis not present

## 2020-05-10 DIAGNOSIS — C50411 Malignant neoplasm of upper-outer quadrant of right female breast: Secondary | ICD-10-CM

## 2020-05-10 DIAGNOSIS — Z79810 Long term (current) use of selective estrogen receptor modulators (SERMs): Secondary | ICD-10-CM | POA: Diagnosis not present

## 2020-05-10 DIAGNOSIS — N6489 Other specified disorders of breast: Secondary | ICD-10-CM | POA: Diagnosis not present

## 2020-05-10 DIAGNOSIS — Z5112 Encounter for antineoplastic immunotherapy: Secondary | ICD-10-CM | POA: Insufficient documentation

## 2020-05-10 IMAGING — MR MR BREAST BILAT WO/W CM
7 of 10 series · 28 of 48 positions shown · IV contrast (gadavist)
Comparison: Previous exam(s).

CLINICAL DATA: Patient with history of right breast lumpectomy [DATE].

EXAM:
BILATERAL BREAST MRI WITH AND WITHOUT CONTRAST
TECHNIQUE: Multiplanar, multisequence MR images of both breasts were obtained
prior to and following the intravenous administration of 9 ml of
Gadavist

[Series 3: T2 · axial · 3.0mm · 0.89mm/px · 1 of 60 slices shown]
[im 1/60]
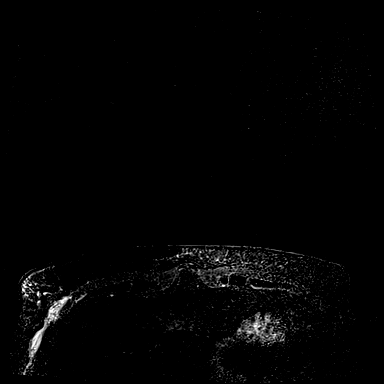

[Series 4: T1 fat-sat · axial · 1.2mm · 0.76mm/px · z∈[-98,+93]mm · 5 of 160 slices shown (1 of 4)]
[im 1/160]
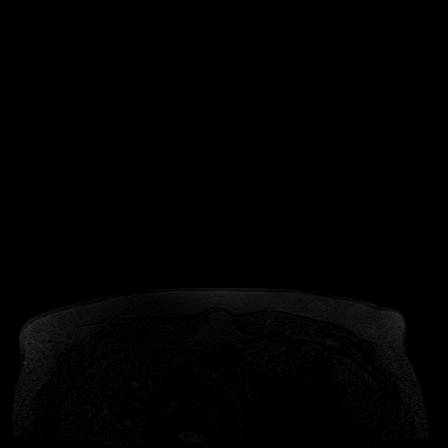
[im 40/160]
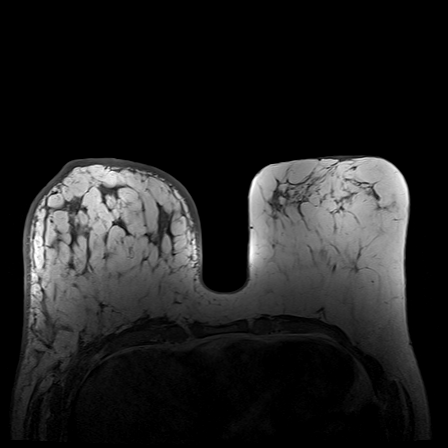
[im 80/160]
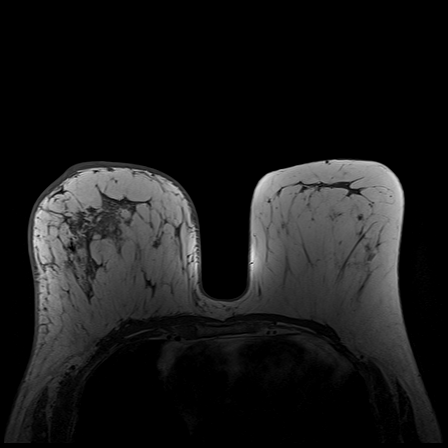
[im 120/160]
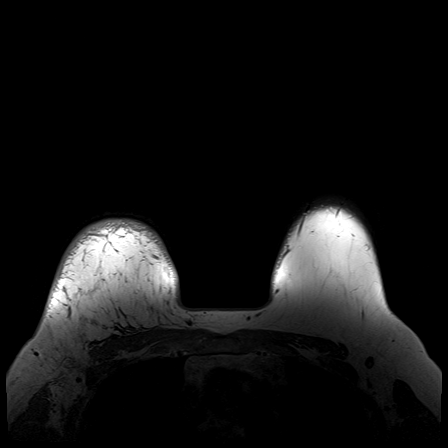
[im 160/160]
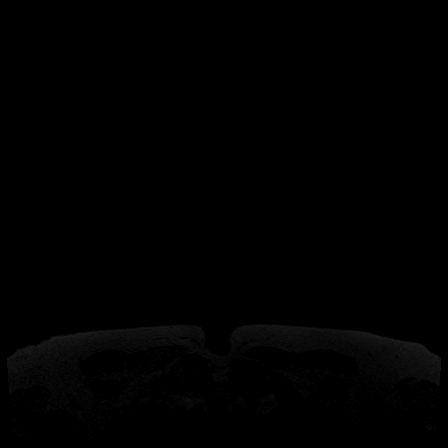

[Series 6: T1 fat-sat · axial · 1.2mm · 0.82mm/px · z∈[-98,+93]mm · 5 of 160 slices shown (2 of 4)]
[im 1/160]
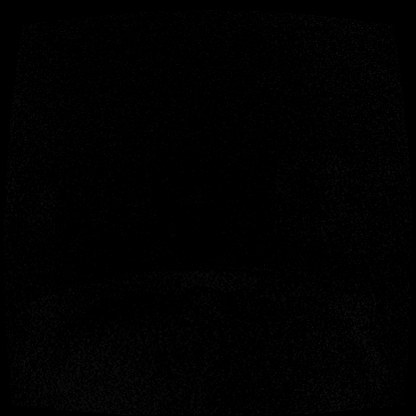
[im 40/160]
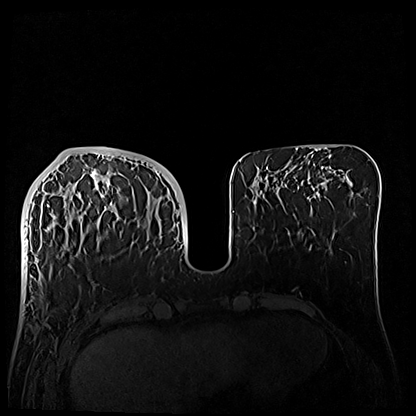
[im 80/160]
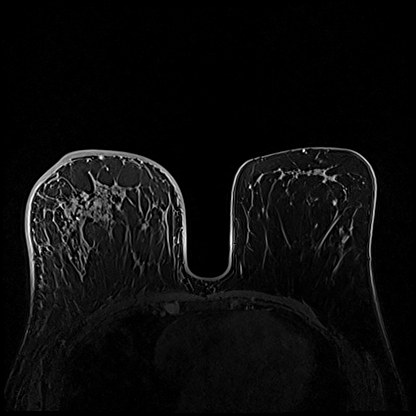
[im 120/160]
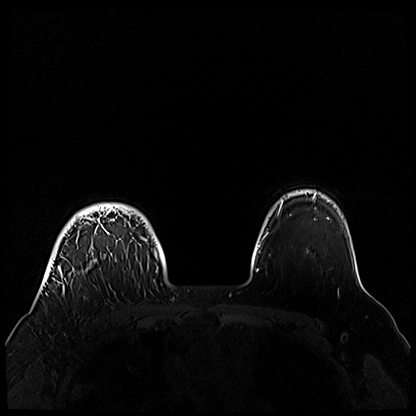
[im 160/160]
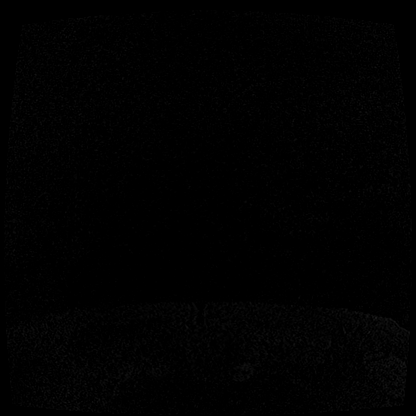

[Series 7: T1 fat-sat · axial · 1.2mm · 0.82mm/px · z∈[-98,+93]mm · 6 of 159 slices shown (3 of 4)]
[im 1/159]
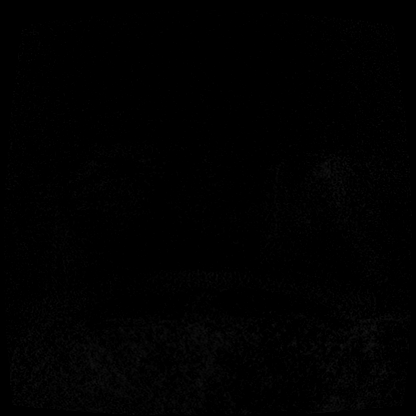
[im 32/159]
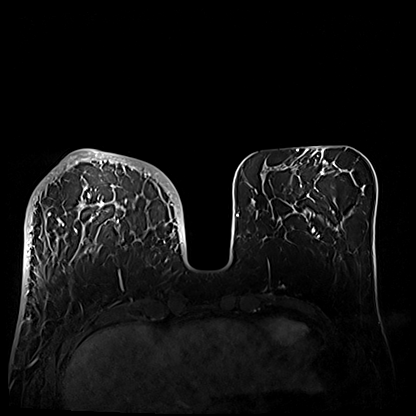
[im 64/159]
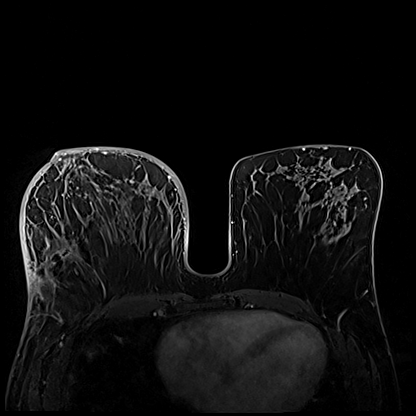
[im 95/159]
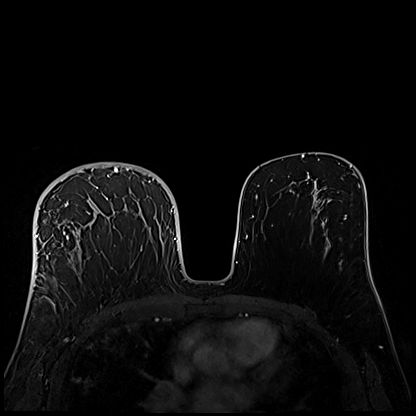
[im 127/159]
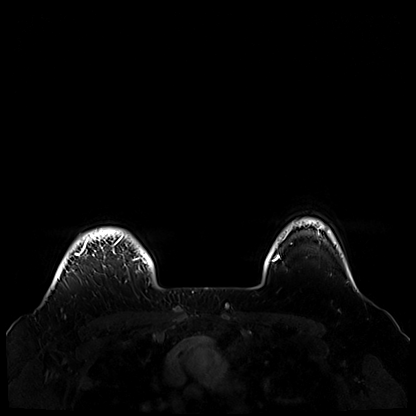
[im 159/159]
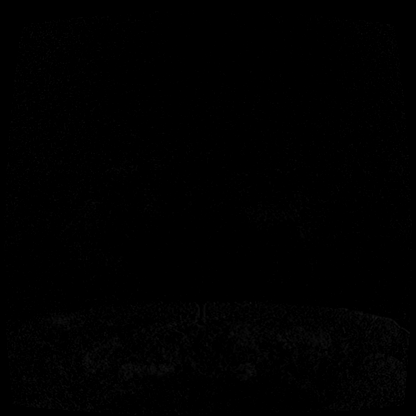

[Series 8: T1 · axial · 1.2mm · 0.82mm/px · z∈[-98,+93]mm · 6 of 160 slices shown (1 of 2)]
[im 1/160]
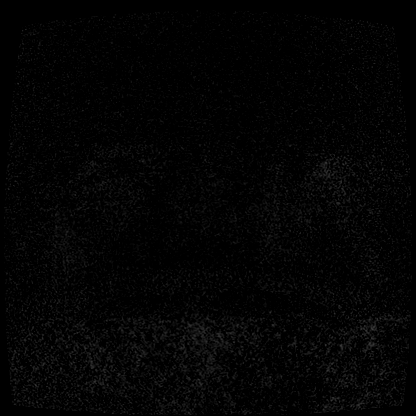
[im 32/160]
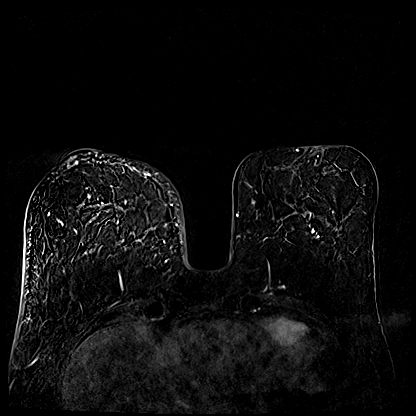
[im 64/160]
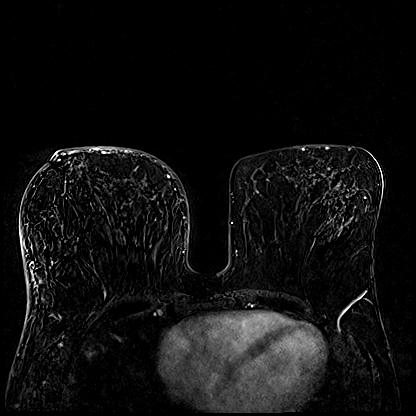
[im 96/160]
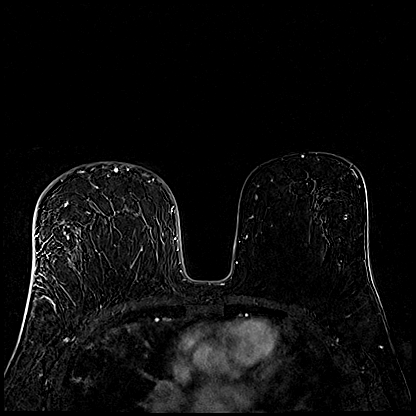
[im 128/160]
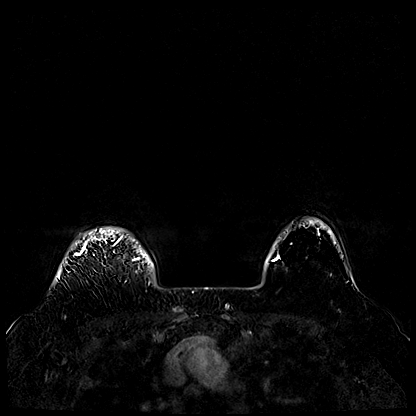
[im 160/160]
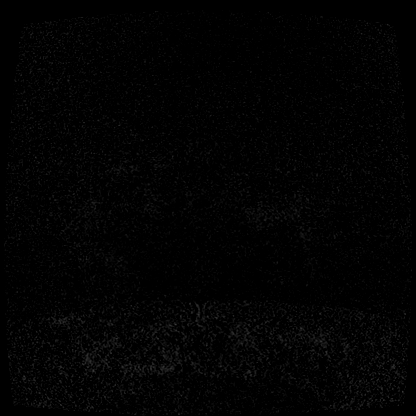

[Series 10: T1 · axial · 192.0mm · 0.82mm/px · 1 of 3 slices shown (2 of 2)]
[im 1/3]
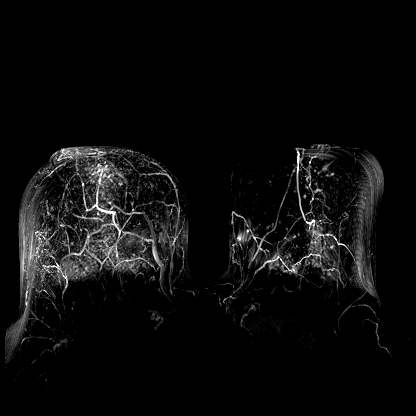

[Series 11: T1 fat-sat · axial · 1.2mm · 0.82mm/px · z∈[-98,+17]mm · 4 of 158 slices shown (4 of 4)]
[im 1/158]
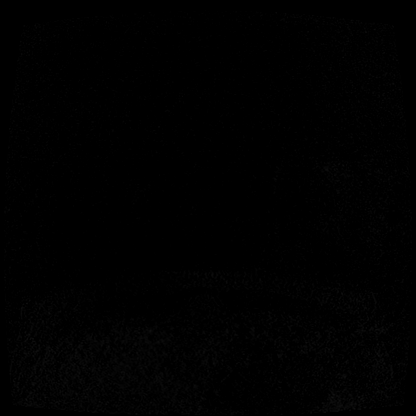
[im 32/158]
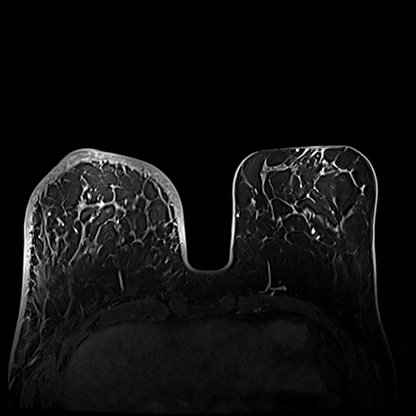
[im 63/158]
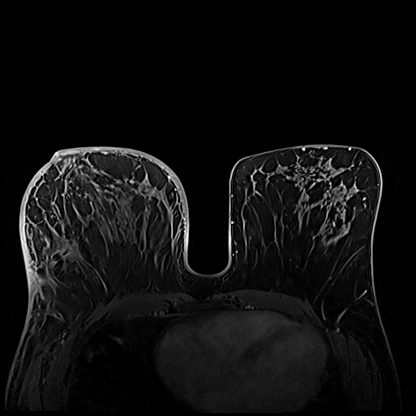
[im 95/158]
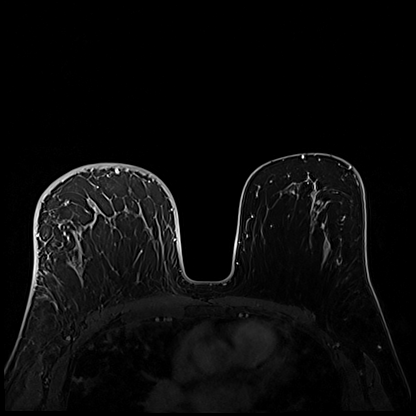

[28 of 48 positions shown; findings below may reference images not displayed]

Three-dimensional MR images were rendered by post-processing of the
original MR data on an independent workstation. The
three-dimensional MR images were interpreted, and findings are
reported in the following complete MRI report for this study. Three
dimensional images were evaluated at the independent interpreting
workstation using the DynaCAD thin client.
FINDINGS: Breast composition: a. Almost entirely fat.

Background parenchymal enhancement: Minimal

Right breast: Post surgical changes are identified within the right
breast. Intramammary lymph node within the upper-outer right breast,
corresponding with the intramammary lymph node evaluated on prior
ultrasound [DATE]. No suspicious enhancing masses identified
within the right breast. There is extensive skin thickening of the
right breast.

Left breast: No mass or abnormal enhancement.

Lymph nodes: Postsurgical changes right axilla. No abnormal lymph
nodes are identified.

Ancillary findings: Scattered nodularity within the anterior aspect
of the visualized right breast with subpleural reticulation.
IMPRESSION: 1. Postlumpectomy and radiation changes involving the right breast.
2. Right breast skin thickening which is nonspecific and likely
secondary to post radiation changes. Recommend clinical evaluation.
3. Scattered nodularity involving the anterior right lung which
likely represents post radiation changes. Additional etiologies not
excluded.

RECOMMENDATION:
1. Clinical evaluation of the right breast skin thickening to
exclude the possibility of other etiologies outside of postradiation
changes. If there is clinical concern, consider skin punch biopsy.
2. Probable postradiation changes right anterior lung. Recommend
dedicated evaluation with chest CT.
3. Continue with annual diagnostic mammography [DATE].

BI-RADS CATEGORY  2: Benign.

## 2020-05-10 MED ORDER — SODIUM CHLORIDE 0.9% FLUSH
10.0000 mL | INTRAVENOUS | Status: DC | PRN
  Administered 2020-05-10: 10 mL
  Filled 2020-05-10: qty 10

## 2020-05-10 MED ORDER — HEPARIN SOD (PORK) LOCK FLUSH 100 UNIT/ML IV SOLN
500.0000 [IU] | Freq: Once | INTRAVENOUS | Status: AC | PRN
  Administered 2020-05-10: 500 [IU]
  Filled 2020-05-10: qty 5

## 2020-05-10 MED ORDER — SODIUM CHLORIDE 0.9 % IV SOLN
Freq: Once | INTRAVENOUS | Status: AC
  Filled 2020-05-10: qty 250

## 2020-05-10 MED ORDER — ACETAMINOPHEN 325 MG PO TABS
650.0000 mg | ORAL_TABLET | Freq: Once | ORAL | Status: AC
  Administered 2020-05-10: 650 mg via ORAL

## 2020-05-10 MED ORDER — DIPHENHYDRAMINE HCL 25 MG PO CAPS
ORAL_CAPSULE | ORAL | Status: AC
  Filled 2020-05-10: qty 2

## 2020-05-10 MED ORDER — TRASTUZUMAB-ANNS CHEMO 150 MG IV SOLR
6.0000 mg/kg | Freq: Once | INTRAVENOUS | Status: AC
  Administered 2020-05-10: 504 mg via INTRAVENOUS
  Filled 2020-05-10: qty 24

## 2020-05-10 MED ORDER — ACETAMINOPHEN 325 MG PO TABS
ORAL_TABLET | ORAL | Status: AC
  Filled 2020-05-10: qty 2

## 2020-05-10 MED ORDER — GADOBUTROL 1 MMOL/ML IV SOLN
9.0000 mL | Freq: Once | INTRAVENOUS | Status: AC | PRN
  Administered 2020-05-10: 9 mL via INTRAVENOUS

## 2020-05-10 MED ORDER — DIPHENHYDRAMINE HCL 25 MG PO CAPS
50.0000 mg | ORAL_CAPSULE | Freq: Four times a day (QID) | ORAL | Status: DC | PRN
  Administered 2020-05-10: 50 mg via ORAL

## 2020-05-10 NOTE — Patient Instructions (Signed)
[facility] Discharge Instructions for Patients Receiving Chemotherapy  Today you received the following chemotherapy agents: Kanjinti  To help prevent nausea and vomiting after your treatment, we encourage you to take your nausea medication as directed.    If you develop nausea and vomiting that is not controlled by your nausea medication, call the clinic.   BELOW ARE SYMPTOMS THAT SHOULD BE REPORTED IMMEDIATELY:  *FEVER GREATER THAN 100.5 F  *CHILLS WITH OR WITHOUT FEVER  NAUSEA AND VOMITING THAT IS NOT CONTROLLED WITH YOUR NAUSEA MEDICATION  *UNUSUAL SHORTNESS OF BREATH  *UNUSUAL BRUISING OR BLEEDING  TENDERNESS IN MOUTH AND THROAT WITH OR WITHOUT PRESENCE OF ULCERS  *URINARY PROBLEMS  *BOWEL PROBLEMS  UNUSUAL RASH Items with * indicate a potential emergency and should be followed up as soon as possible.  Feel free to call the clinic should you have any questions or concerns. The clinic phone number is [phone].  Please show the CHEMO ALERT CARD at check-in to the Emergency Department and triage nurse.   

## 2020-05-10 NOTE — Progress Notes (Signed)
[place]   [phone] [phone]   Clinic Follow up Note   Patient Care Team: Patient, [name] (Inactive) as PCP - General ([place]) Minus Breeding, MD as PCP - Cardiology (Cardiology) Armbruster, Carlota Raspberry, MD as Consulting Physician (Gastroenterology) Eyvonne Mechanic as Counselor (Licensed Clinical Social Worker) Truitt Merle, MD as Consulting Physician (Hematology) Alla Feeling, NP as Nurse Practitioner (Nurse Practitioner) Mauro Kaufmann, RN as Oncology Nurse Navigator Rockwell Germany, RN as Oncology Nurse Navigator Rolm Bookbinder, MD as Consulting Physician (General Surgery)  Date of Service:  05/10/2020  CHIEF COMPLAINT: f/u of right breast cancer  SUMMARY OF ONCOLOGIC HISTORY: Oncology History Overview Note  Cancer Staging Malignant neoplasm of upper-outer quadrant of right breast in female, estrogen receptor positive ([place]) Staging form: Breast, AJCC 8th Edition - Clinical stage from 06/01/2019: Stage IA (cT1c, cN0, cM0, G3, ER+, PR-, HER2+) - Signed by Truitt Merle, MD on 06/17/2019 - Pathologic stage from 06/30/2019: Stage IA (pT1c, pN0, cM0, G3, ER+, PR-, HER2+) - Signed by Truitt Merle, MD on 07/15/2019    Malignant neoplasm of upper-outer quadrant of right breast in female, estrogen receptor positive ([place])  10/2018 -  Anti-estrogen oral therapy   She was preventively started on Tamoxifen since 10/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 Held after 06/17/19 to proceed with her breast cancer surgery and chemo.    05/06/2019 Breast MRI   IMPRESSION Enhancing right breast mass suspicious for malignancy. Second look [place] if recommended.  No Suspicious enhancing left breast masses.    05/24/2019 Breast US   IMPRESSION 1.8cm irregular right breast mass within the 9:30 position 3.5cm from the nipple is highly suggestive of malignancy. An Ultrasound guided breast biopsy is recommended. The findings of the study and recommendation for biopsy  were discussed with the patient directly.    06/01/2019 Cancer Staging   Staging form: Breast, AJCC 8th Edition - Clinical stage from 06/01/2019: Stage IA (cT1c, cN0, cM0, G3, ER+, PR-, HER2+) - Signed by Truitt Merle, MD on 06/17/2019   06/01/2019 Initial Biopsy   Breast Biopsy - Right Breast Core Bx DIAGNOSIS INFILTRATING DUCT CARCINOMA WITH MINOR DUCTAL CARCINOMA IN SITU COMPNENT OF RIGHT BREAST, CORE NEEDLE BIOPSY    06/01/2019 Receptors her2   ER: greater than 75% of tumor cells show moderate staining  PR: Negative  HER2: Positive 3+ Ki67: 40%   06/17/2019 Initial Diagnosis   Malignant neoplasm of upper-outer quadrant of right breast in female, estrogen receptor positive ([place])   06/30/2019 Surgery   RIGHT BREAST LUMPECTOMY WITH RADIOACTIVE SEED AND RIGHT AXILLARY SENTINEL NODE BIOPSY and PAC placement  by Dr Donne Hazel     06/30/2019 Pathology Results   FINAL MICROSCOPIC DIAGNOSIS:   A. BREAST, RIGHT, LUMPECTOMY:  - Invasive ductal carcinoma, grade 3, spanning 1.5 cm.  - Intermediate grade ductal carcinoma in situ.  - Invasive carcinoma is 0.1-0.2 from the final lateral margin (part E).  - Margins are negative for in situ carcinoma.  - Biopsy site.  - See oncology table.   B. LYMPH NODE, RIGHT AXILLARY, SENTINEL, BIOPSY:  - One of one lymph nodes negative for carcinoma (0/1).   C. LYMPH NODE, RIGHT AXILLARY, SENTINEL, BIOPSY:  - One of one lymph nodes negative for carcinoma (0/1).   D. LYMPH NODE, RIGHT AXILLARY, SENTINEL, BIOPSY:  - One of one lymph nodes negative for carcinoma (0/1).   E. BREAST, RIGHT ADDITIONAL LATERAL MARGIN, EXCISION:  - Invasive ductal carcinoma.  - Invasive  carcinoma is 0.1-0.2 cm from the new lateral margin.   F. BREAST, RIGHT ADDITIONAL POSTERIOR MARGIN, EXCISION:  - Fibrocystic change and usual ductal hyperplasia.  - No malignancy identified.   G. BREAST, RIGHT ADDITIONAL SUPERIOR MARGIN, EXCISION:  - Fibrocystic change and usual ductal  hyperplasia.  - Incidental radial scar.  - No malignancy identified.     PROGNOSTIC INDICATOR RESULTS:  The tumor cells are POSITIVE for Her2 (3+).  Estrogen Receptor:       POSITIVE, 85%, MODERATE STAINING  Progesterone Receptor:   NEGATIVE  Proliferation Marker Ki-67:   25%    06/30/2019 Cancer Staging   Staging form: Breast, AJCC 8th Edition - Pathologic stage from 06/30/2019: Stage IA (pT1c, pN0, cM0, G3, ER+, PR-, HER2+) - Signed by Truitt Merle, MD on 07/15/2019   07/29/2019 -  Chemotherapy   Adjuvant Weekly Abraxane (Due to her insurance denial of Abraxane, I changed it to paclitaxel starting 09/15/19) and Transtuzumab for 12 weeks starting 07/29/19-10/12/19, followed by maintenance Herceptin q3weeks starting 11/03/19 to complete 1 year of treatment. Due to headaches her Herceptin (Ogivr) was switched to Herceptin Calla Kicks) on 09/08/19.     12/2019 - 01/2020 Radiation Therapy   Outside Radiation in late 2021.       CURRENT THERAPY:  Maintenance Herceptin(Kanjinti)q3weeksstarting 9/30/21to complete 1 year of treatmentfrom 07/29/19, in 07/2020. Tamoxifen, started 11/2019  INTERVAL HISTORY:  Abigail Berg is here for a follow up of breast cancer. She was last seen by me on 03/29/20. She presents to the clinic accompanied by her friend. She reports some tolerable hot flashes from the tamoxifen.  She noticed a knot in her breast about 2 weeks ago. She notes some intermittent pain there.   REVIEW OF SYSTEMS:   Constitutional: Denies fevers, chills or abnormal weight loss Eyes: Denies blurriness of vision Ears, nose, mouth, throat, and face: Denies mucositis or sore throat Respiratory: Denies cough, dyspnea or wheezes Cardiovascular: Denies palpitation, chest discomfort or lower extremity swelling Gastrointestinal:  Denies nausea, heartburn or change in bowel habits Skin: Denies abnormal skin rashes Lymphatics: Denies new lymphadenopathy or easy bruising Neurological:Denies numbness,  tingling or new weaknesses Behavioral/Psych: Mood is stable, no new changes Breasts: (+) tenderness, (+) palpable "knot"  All other systems were reviewed with the patient and are negative.  MEDICAL HISTORY:  Past Medical History:  Diagnosis Date  . Cancer [facility])    right breast  . Depression   . Family history of colon cancer   . History of kidney stones   . Monoallelic mutation of CHEK2 gene in female patient   . Personal history of colonic polyps   . Pneumonia   . S/P partial colectomy     SURGICAL HISTORY: Past Surgical History:  Procedure Laterality Date  . BREAST LUMPECTOMY WITH RADIOACTIVE SEED AND SENTINEL LYMPH NODE BIOPSY Right 06/30/2019   Procedure: RIGHT BREAST LUMPECTOMY WITH RADIOACTIVE SEED AND RIGHT AXILLARY SENTINEL NODE BIOPSY;  Surgeon: Rolm Bookbinder, MD;  Location: [place];  Service: General;  Laterality: Right;  PEC BLOCK  . BREAST SURGERY    . COLON SURGERY     Partial colectomy  . COLONOSCOPY    . PARATHYROIDECTOMY Left 01/25/2020   Procedure: LEFT INFERIOR PARATHYROIDECTOMY;  Surgeon: Armandina Gemma, MD;  Location: [facility];  Service: General;  Laterality: Left;  . POLYPECTOMY    . PORTACATH PLACEMENT Left 06/30/2019   Procedure: INSERTION PORT-A-CATH WITH ULTRASOUND GUIDANCE;  Surgeon: Rolm Bookbinder, MD;  Location: [place];  Service:  General;  Laterality: Left;  Marland Kitchen VENTRAL HERNIA REPAIR N/A 01/25/2020   Procedure: HERNIA REPAIR VENTRAL ADULT WITH MESH;  Surgeon: Armandina Gemma, MD;  Location: [facility];  Service: General;  Laterality: N/A;    I have reviewed the social history and family history with the patient and they are unchanged from previous note.  ALLERGIES:  has No Known Allergies.  MEDICATIONS:  Current Outpatient Medications  Medication Sig Dispense Refill  . Cholecalciferol (VITAMIN D) 50 MCG (2000 UT) tablet Take 2,000 Units by mouth daily.    Marland Kitchen escitalopram (LEXAPRO) 20 MG tablet Take 20 mg by mouth  daily.     Marland Kitchen ibuprofen (ADVIL) 200 MG tablet Take 600 mg by mouth every 6 (six) hours as needed for headache or mild pain.    Marland Kitchen lidocaine-prilocaine (EMLA) cream Apply to affected area once (Patient taking differently: Apply 1 application topically daily as needed (port access).) 30 g 3  . tamoxifen (NOLVADEX) 20 MG tablet TAKE 1 TABLET(20 MG) BY MOUTH DAILY 30 tablet 5   No current facility-administered medications for this visit.    PHYSICAL EXAMINATION: ECOG PERFORMANCE STATUS: 0 - Asymptomatic  Vitals:   05/10/20 1438  BP: 126/83  Pulse: 82  Resp: 20  Temp: (!) 97.4 F (36.3 C)  SpO2: 98%   Filed Weights   05/10/20 1438  Weight: 197 lb 12.8 oz (89.7 kg)    GENERAL:alert, no distress and comfortable SKIN: skin color, texture, turgor are normal, no rashes or significant lesions EYES: normal, Conjunctiva are pink and non-injected, sclera clear  NECK: supple, thyroid normal size, non-tender, without nodularity LYMPH:  no palpable lymphadenopathy in the cervical, axillary  LUNGS: clear to auscultation and percussion with normal breathing effort HEART: regular rate & rhythm and no murmurs and no lower extremity edema ABDOMEN:abdomen soft, non-tender and normal bowel sounds Musculoskeletal:no cyanosis of digits and no clubbing  NEURO: alert & oriented x 3 with fluent speech, no focal motor/sensory deficits BREASTS: right breast swollen, mild lymphedema, area of hardness approximately 2 cm corresponding to patient's palpable knot, located below incision, tender to palpation. Left breast normal  LABORATORY DATA:  I have reviewed the data as listed CBC Latest Ref Rng & Units 03/29/2020 03/08/2020 02/16/2020  WBC 4.0 - 10.5 K/uL 5.4 8.4 8.3  Hemoglobin 12.0 - 15.0 g/dL 13.4 14.6 14.3  Hematocrit 36.0 - 46.0 % 38.5 43.1 41.2  Platelets 150 - 400 K/uL 236 242 241     CMP Latest Ref Rng & Units 03/29/2020 03/08/2020 02/16/2020  Glucose 70 - 99 mg/dL 92 131(H) 97  BUN 6 - 20 mg/dL $Remove'14 13  11  'KKDgeMF$ Creatinine 0.44 - 1.00 mg/dL 0.89 0.98 0.83  Sodium 135 - 145 mmol/L 140 138 141  Potassium 3.5 - 5.1 mmol/L 3.8 4.3 3.7  Chloride 98 - 111 mmol/L 109 107 109  CO2 22 - 32 mmol/L $RemoveB'23 23 25  'bERIZqNJ$ Calcium 8.9 - 10.3 mg/dL 8.8(L) 9.0 9.0  Total Protein 6.5 - 8.1 g/dL 6.6 7.3 6.9  Total Bilirubin 0.3 - 1.2 mg/dL 0.2(L) 0.2(L) 0.4  Alkaline Phos 38 - 126 U/L 68 81 83  AST 15 - 41 U/L 13(L) 12(L) 11(L)  ALT 0 - 44 U/L $Remo'9 9 7      'MSnSz$ RADIOGRAPHIC STUDIES: I have personally reviewed the radiological images as listed and agreed with the findings in the report. MR BREAST BILATERAL W WO CONTRAST INC CAD  Result Date: 05/10/2020 CLINICAL DATA:  Patient with history of right breast lumpectomy May  2021. EXAM: BILATERAL BREAST MRI WITH AND WITHOUT CONTRAST TECHNIQUE: Multiplanar, multisequence MR images of both breasts were obtained prior to and following the intravenous administration of 9 ml of Gadavist Three-dimensional MR images were rendered by post-processing of the original MR data on an independent workstation. The three-dimensional MR images were interpreted, and findings are reported in the following complete MRI report for this study. Three dimensional images were evaluated at the independent interpreting workstation using the DynaCAD thin client. COMPARISON:  Previous exam(s). FINDINGS: Breast composition: a. Almost entirely fat. Background parenchymal enhancement: Minimal Right breast: Post surgical changes are identified within the right breast. Intramammary lymph node within the upper-outer right breast, corresponding with the intramammary lymph node evaluated on prior ultrasound 03/01/2020. No suspicious enhancing masses identified within the right breast. There is extensive skin thickening of the right breast. Left breast: No mass or abnormal enhancement. Lymph nodes: Postsurgical changes right axilla. No abnormal lymph nodes are identified. Ancillary findings: Scattered nodularity within the anterior  aspect of the visualized right breast with subpleural reticulation. IMPRESSION: 1. Postlumpectomy and radiation changes involving the right breast. 2. Right breast skin thickening which is nonspecific and likely secondary to post radiation changes. Recommend clinical evaluation. 3. Scattered nodularity involving the anterior right lung which likely represents post radiation changes. Additional etiologies not excluded. RECOMMENDATION: 1. Clinical evaluation of the right breast skin thickening to exclude the possibility of other etiologies outside of postradiation changes. If there is clinical concern, consider skin punch biopsy. 2. Probable postradiation changes right anterior lung. Recommend dedicated evaluation with chest CT. 3. Continue with annual diagnostic mammography 11/2020. BI-RADS CATEGORY  2: Benign. Electronically Signed   By: Lovey Newcomer M.D.   On: 05/10/2020 16:12     ASSESSMENT & PLAN:  Abigail Berg is a 55 y.o. female with   1.Malignant neoplasm of upper-outer quadrant of right breast,Stage1A,p(T1cN0M0),stage IA,ER+/PR-/HER2+, Grade3. -She was diagnosed in 05/2019.She is s/pright breast lumpectomy and adjuvant radiation at local.  -Given her high risk HER2 positive breast cancer,shecompletedchemotherapy withweeklyAbraxane and Herceptin 07/29/19-10/12/19.She started maintenance Hereptin (Kanjinti) on 11/03/19, given every 3 weeks until June 2022 -She tarted adjuvant tamoxifen in 11/2019, tolerating well overall with mild hot flashes. -She has a palpable mass in her outer right breast. She just had breast MRI today, will let her know the result ASAP. I discussed that this could be scar tissue. -Lab reviewed, adequate for treatment, will proceed Herceptin today -Lab and follow-up in 6 weeks with Herceptin  2. Diffuse skin rashes  -Started in right breast, subsequently broke out diffusely in upper body  -Resolved with Benadryl and Cipro   3. Bone Health -Most recent bone  density from 12/08/19 was -1.7. -I discussed Zometa today and gave her information. She will consider this.  4. 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 -She will f/u with Dr. Havery Moros for screening colonoscopies, last done 07/2018 with12polyps. I recommend to repeat in next 2 years.  -Shewaspreviouslyonchemoprevention withTamoxifen 10/2018-06/17/19,restarted 11/2019 following breast cancer treatment   5. S/p ileocecectomy on 08/14/15 due to benign colon polyp invading appendix, history of recurrent colon polyps  6.H/o right breast fibroadenoma,S/p rightsurgical resection on 11/22/15  7. Primary hyperparathyroidism -She reported right hip/thigh pain. I prescribed gabapentin 11/29/19. -She underwent left inferior parathyroidectomy with Dr Harlow Asa on 01/25/20.  -Her pain resolved after surgery, and she no longer needs the gabapentin.  PLAN: -proceed trastuzumab today (3 doses remaining after today) -labs and f/u with last trastuzumab on 07/12/20  No problem-specific Assessment & Plan notes found for this encounter.   No orders of the defined types were placed in this encounter.  All questions were answered. The patient knows to call the clinic with any problems, questions or concerns. No barriers to learning was detected. The total time spent in the appointment was 30 minutes.     Truitt Merle, MD 05/10/2020   I, Wilburn Mylar, am acting as scribe for Truitt Merle, MD.   I have reviewed the above documentation for accuracy and completeness, and I agree with the above.   Addendum  Pt's MRI result discussed with pt. patient is concerned about the findings in her lung, would like to have a CT chest as soon as possible.  I will order it.   Truitt Merle  05/16/2020

## 2020-05-16 ENCOUNTER — Encounter: Payer: Self-pay | Admitting: Hematology

## 2020-05-17 ENCOUNTER — Ambulatory Visit (HOSPITAL_COMMUNITY): Payer: BC Managed Care – PPO | Attending: Cardiology

## 2020-05-17 ENCOUNTER — Other Ambulatory Visit: Payer: Self-pay

## 2020-05-17 DIAGNOSIS — Z79899 Other long term (current) drug therapy: Secondary | ICD-10-CM | POA: Diagnosis not present

## 2020-05-17 LAB — ECHOCARDIOGRAM COMPLETE
Area-P 1/2: 3.27 cm2
S' Lateral: 3.7 cm

## 2020-05-22 ENCOUNTER — Ambulatory Visit (HOSPITAL_COMMUNITY): Payer: BC Managed Care – PPO

## 2020-05-23 NOTE — Progress Notes (Signed)
[place]   [phone] [phone]   Clinic Follow up Note   Patient Care Team: Patient, [name] (Inactive) as PCP - General ([place]) Minus Breeding, MD as PCP - Cardiology (Cardiology) Armbruster, Carlota Raspberry, MD as Consulting Physician (Gastroenterology) Eyvonne Mechanic as Counselor (Licensed Clinical Social Worker) Truitt Merle, MD as Consulting Physician (Hematology) Alla Feeling, NP as Nurse Practitioner (Nurse Practitioner) Mauro Kaufmann, RN as Oncology Nurse Navigator Rockwell Germany, RN as Oncology Nurse Navigator Rolm Bookbinder, MD as Consulting Physician (General Surgery)   I connected with Arbutus Leas on 05/25/2020 at  4:20 PM EDT by telephone visit and verified that I am speaking with the correct person using two identifiers.  I discussed the limitations, risks, security and privacy concerns of performing an evaluation and management service by telephone and the availability of in person appointments. I also discussed with the patient that there may be a patient responsible charge related to this service. The patient expressed understanding and agreed to proceed.   Other persons participating in the visit and their role in the encounter:  None  Patient's location:  Her home Provider's location:  My office   CHIEF COMPLAINT:  f/u of right breast cancer  SUMMARY OF ONCOLOGIC HISTORY: Oncology History Overview Note  Cancer Staging Malignant neoplasm of upper-outer quadrant of right breast in female, estrogen receptor positive ([place]) Staging form: Breast, AJCC 8th Edition - Clinical stage from 06/01/2019: Stage IA (cT1c, cN0, cM0, G3, ER+, PR-, HER2+) - Signed by Truitt Merle, MD on 06/17/2019 - Pathologic stage from 06/30/2019: Stage IA (pT1c, pN0, cM0, G3, ER+, PR-, HER2+) - Signed by Truitt Merle, MD on 07/15/2019    Malignant neoplasm of upper-outer quadrant of right breast in female, estrogen receptor positive ([place])  10/2018  -  Anti-estrogen oral therapy   She was preventively started on Tamoxifen since 10/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 Held after 06/17/19 to proceed with her breast cancer surgery and chemo.    05/06/2019 Breast MRI   IMPRESSION Enhancing right breast mass suspicious for malignancy. Second look [place] if recommended.  No Suspicious enhancing left breast masses.    05/24/2019 Breast US   IMPRESSION 1.8cm irregular right breast mass within the 9:30 position 3.5cm from the nipple is highly suggestive of malignancy. An Ultrasound guided breast biopsy is recommended. The findings of the study and recommendation for biopsy were discussed with the patient directly.    06/01/2019 Cancer Staging   Staging form: Breast, AJCC 8th Edition - Clinical stage from 06/01/2019: Stage IA (cT1c, cN0, cM0, G3, ER+, PR-, HER2+) - Signed by Truitt Merle, MD on 06/17/2019   06/01/2019 Initial Biopsy   Breast Biopsy - Right Breast Core Bx DIAGNOSIS INFILTRATING DUCT CARCINOMA WITH MINOR DUCTAL CARCINOMA IN SITU COMPNENT OF RIGHT BREAST, CORE NEEDLE BIOPSY    06/01/2019 Receptors her2   ER: greater than 75% of tumor cells show moderate staining  PR: Negative  HER2: Positive 3+ Ki67: 40%   06/17/2019 Initial Diagnosis   Malignant neoplasm of upper-outer quadrant of right breast in female, estrogen receptor positive ([place])   06/30/2019 Surgery   RIGHT BREAST LUMPECTOMY WITH RADIOACTIVE SEED AND RIGHT AXILLARY SENTINEL NODE BIOPSY and PAC placement  by Dr Donne Hazel     06/30/2019 Pathology Results   FINAL MICROSCOPIC DIAGNOSIS:   A. BREAST, RIGHT, LUMPECTOMY:  - Invasive ductal carcinoma, grade 3, spanning 1.5 cm.  - Intermediate grade ductal carcinoma in situ.  - Invasive  carcinoma is 0.1-0.2 from the final lateral margin (part E).  - Margins are negative for in situ carcinoma.  - Biopsy site.  - See oncology table.   B. LYMPH NODE, RIGHT AXILLARY, SENTINEL, BIOPSY:  - One of one lymph nodes negative for carcinoma (0/1).    C. LYMPH NODE, RIGHT AXILLARY, SENTINEL, BIOPSY:  - One of one lymph nodes negative for carcinoma (0/1).   D. LYMPH NODE, RIGHT AXILLARY, SENTINEL, BIOPSY:  - One of one lymph nodes negative for carcinoma (0/1).   E. BREAST, RIGHT ADDITIONAL LATERAL MARGIN, EXCISION:  - Invasive ductal carcinoma.  - Invasive carcinoma is 0.1-0.2 cm from the new lateral margin.   F. BREAST, RIGHT ADDITIONAL POSTERIOR MARGIN, EXCISION:  - Fibrocystic change and usual ductal hyperplasia.  - No malignancy identified.   G. BREAST, RIGHT ADDITIONAL SUPERIOR MARGIN, EXCISION:  - Fibrocystic change and usual ductal hyperplasia.  - Incidental radial scar.  - No malignancy identified.     PROGNOSTIC INDICATOR RESULTS:  The tumor cells are POSITIVE for Her2 (3+).  Estrogen Receptor:       POSITIVE, 85%, MODERATE STAINING  Progesterone Receptor:   NEGATIVE  Proliferation Marker Ki-67:   25%    06/30/2019 Cancer Staging   Staging form: Breast, AJCC 8th Edition - Pathologic stage from 06/30/2019: Stage IA (pT1c, pN0, cM0, G3, ER+, PR-, HER2+) - Signed by Truitt Merle, MD on 07/15/2019   07/29/2019 -  Chemotherapy   Adjuvant Weekly Abraxane (Due to her insurance denial of Abraxane, I changed it to paclitaxel starting 09/15/19) and Transtuzumab for 12 weeks starting 07/29/19-10/12/19, followed by maintenance trastuzumab q3weeks starting 11/03/19 to complete 1 year of treatment. Due to headaches her trastuzumab (Ogivr) was switched to trastuzumab (Kanjinti) on 09/08/19. Plan to complete 07/12/20.     12/2019 - 01/2020 Radiation Therapy   Outside Radiation in late 2021.       CURRENT THERAPY:  Maintenance trastuzumab(Kanjinti)q3weeksstarting 9/30/21to complete 1 year of treatmentfrom 07/29/19-07/12/20 Tamoxifen, started 11/2019  INTERVAL HISTORY:  Abigail Berg presents for a virtual follow up. She notes she has some pressure in her chest. She notes she is tolerating Tamoxifen. She denies anymore skin rash.      REVIEW OF SYSTEMS:   Constitutional: Denies fevers, chills or abnormal weight loss Eyes: Denies blurriness of vision Ears, nose, mouth, throat, and face: Denies mucositis or sore throat Respiratory: Denies cough, dyspnea or wheezes Cardiovascular: Denies palpitation, chest discomfort or lower extremity swelling Gastrointestinal:  Denies nausea, heartburn or change in bowel habits Skin: Denies abnormal skin rashes Lymphatics: Denies new lymphadenopathy or easy bruising Neurological:Denies numbness, tingling or new weaknesses Behavioral/Psych: Mood is stable, no new changes  All other systems were reviewed with the patient and are negative.  MEDICAL HISTORY:  Past Medical History:  Diagnosis Date  . Cancer [facility])    right breast  . Depression   . Family history of colon cancer   . History of kidney stones   . Monoallelic mutation of CHEK2 gene in female patient   . Personal history of colonic polyps   . Pneumonia   . S/P partial colectomy     SURGICAL HISTORY: Past Surgical History:  Procedure Laterality Date  . BREAST LUMPECTOMY WITH RADIOACTIVE SEED AND SENTINEL LYMPH NODE BIOPSY Right 06/30/2019   Procedure: RIGHT BREAST LUMPECTOMY WITH RADIOACTIVE SEED AND RIGHT AXILLARY SENTINEL NODE BIOPSY;  Surgeon: Rolm Bookbinder, MD;  Location: [place];  Service: General;  Laterality: Right;  PEC BLOCK  . BREAST  SURGERY    . COLON SURGERY     Partial colectomy  . COLONOSCOPY    . PARATHYROIDECTOMY Left 01/25/2020   Procedure: LEFT INFERIOR PARATHYROIDECTOMY;  Surgeon: Armandina Gemma, MD;  Location: [facility];  Service: General;  Laterality: Left;  . POLYPECTOMY    . PORTACATH PLACEMENT Left 06/30/2019   Procedure: INSERTION PORT-A-CATH WITH ULTRASOUND GUIDANCE;  Surgeon: Rolm Bookbinder, MD;  Location: [place];  Service: General;  Laterality: Left;  Marland Kitchen VENTRAL HERNIA REPAIR N/A 01/25/2020   Procedure: HERNIA REPAIR VENTRAL ADULT WITH MESH;   Surgeon: Armandina Gemma, MD;  Location: [facility];  Service: General;  Laterality: N/A;    I have reviewed the social history and family history with the patient and they are unchanged from previous note.  ALLERGIES:  has No Known Allergies.  MEDICATIONS:  Current Outpatient Medications  Medication Sig Dispense Refill  . Cholecalciferol (VITAMIN D) 50 MCG (2000 UT) tablet Take 2,000 Units by mouth daily.    Marland Kitchen escitalopram (LEXAPRO) 20 MG tablet Take 20 mg by mouth daily.     Marland Kitchen ibuprofen (ADVIL) 200 MG tablet Take 600 mg by mouth every 6 (six) hours as needed for headache or mild pain.    Marland Kitchen lidocaine-prilocaine (EMLA) cream Apply to affected area once (Patient taking differently: Apply 1 application topically daily as needed (port access).) 30 g 3  . tamoxifen (NOLVADEX) 20 MG tablet TAKE 1 TABLET(20 MG) BY MOUTH DAILY 30 tablet 5   No current facility-administered medications for this visit.    PHYSICAL EXAMINATION: ECOG PERFORMANCE STATUS: 0 - Asymptomatic  No vitals taken today, Exam not performed today   LABORATORY DATA:  I have reviewed the data as listed CBC Latest Ref Rng & Units 03/29/2020 03/08/2020 02/16/2020  WBC 4.0 - 10.5 K/uL 5.4 8.4 8.3  Hemoglobin 12.0 - 15.0 g/dL 13.4 14.6 14.3  Hematocrit 36.0 - 46.0 % 38.5 43.1 41.2  Platelets 150 - 400 K/uL 236 242 241     CMP Latest Ref Rng & Units 03/29/2020 03/08/2020 02/16/2020  Glucose 70 - 99 mg/dL 92 131(H) 97  BUN 6 - 20 mg/dL $Remove'14 13 11  'QlfzXOl$ Creatinine 0.44 - 1.00 mg/dL 0.89 0.98 0.83  Sodium 135 - 145 mmol/L 140 138 141  Potassium 3.5 - 5.1 mmol/L 3.8 4.3 3.7  Chloride 98 - 111 mmol/L 109 107 109  CO2 22 - 32 mmol/L $RemoveB'23 23 25  'NJaHWeBL$ Calcium 8.9 - 10.3 mg/dL 8.8(L) 9.0 9.0  Total Protein 6.5 - 8.1 g/dL 6.6 7.3 6.9  Total Bilirubin 0.3 - 1.2 mg/dL 0.2(L) 0.2(L) 0.4  Alkaline Phos 38 - 126 U/L 68 81 83  AST 15 - 41 U/L 13(L) 12(L) 11(L)  ALT 0 - 44 U/L $Remo'9 9 7      'qLYVg$ RADIOGRAPHIC STUDIES: I have personally reviewed the radiological  images as listed and agreed with the findings in the report. CT Chest Wo Contrast  Result Date: 05/25/2020 CLINICAL DATA:  Right breast cancer. EXAM: CT CHEST WITHOUT CONTRAST TECHNIQUE: Multidetector CT imaging of the chest was performed following the standard protocol without IV contrast. COMPARISON:  None. FINDINGS: Cardiovascular: The heart size is normal. No substantial pericardial effusion. Atherosclerotic calcification is noted in the wall of the thoracic aorta. Left Port-A-Cath tip is positioned in the mid SVC. Mediastinum/Nodes: No mediastinal lymphadenopathy. 9 mm short axis precarinal node is upper normal for size. No evidence for gross hilar lymphadenopathy although assessment is limited by the lack of intravenous contrast on today's study.  The esophagus has normal imaging features. There is no axillary lymphadenopathy. Lungs/Pleura: Centrilobular emphsyema noted. Bandlike lung opacity in the anterior right upper lobe is presumably secondary to radiation therapy. There is a 13 mm nodular component to this finding seen in the peripheral right upper lobe on image 51/7. 4 mm posterior right upper lobe pulmonary nodule evident on 59/7. 2 mm left lower lobe nodule identified on 66/7. No suspicious pulmonary nodule or mass in the left lung. Subsegmental atelectasis or linear scarring noted left lower lobe. No focal airspace consolidation. No pleural effusion. Upper Abdomen: Unremarkable. Musculoskeletal: No worrisome lytic or sclerotic osseous abnormality. Surgical changes in skin thickening noted in the right breast. IMPRESSION: 1. Bandlike lung opacity in the anterior right upper lobe is presumably secondary to radiation therapy. There is a 13 mm nodular component in the posterior aspect of this finding. While this may be related to the fibrosis, the appearance is certainly more nodular than typically seen and close follow-up recommended. Consider repeat CT chest in 3 months. 2. Additional tiny bilateral  pulmonary nodules measuring up to 4 mm, likely benign. Attention on follow-up recommended. 3. Aortic Atherosclerosis (ICD10-I70.0) and Emphysema (ICD10-J43.9). Electronically Signed   By: Misty Stanley M.D.   On: 05/25/2020 09:41     ASSESSMENT & PLAN:  Abigail Berg is a 55 y.o. female with    1.Malignant neoplasm of upper-outer quadrant of right breast,Stage1A,p(T1cN0M0),stage IA,ER+/PR-/HER2+, Grade3. -She was diagnosed in 05/2019.She is s/pright breast lumpectomyand adjuvant radiation -Given her high risk HER2 positive breast cancer,shecompletedchemotherapy withweeklyAbraxane and trastuzumab 07/29/19-10/12/19.She started maintenance  trastuzumab (Kanjinti) on 11/03/19, given every 3 weeks until June 2022 -She started adjuvant tamoxifen in 11/2019, tolerating well overall with mild hot flashes. -She has a palpable mass in her outer right breast. MRI breast from 05/10/20 shows right breast changes to be radiation related. Scan also showed scattered nodularity involving the anterior right lung which likely represents post radiation changes.  -Her CT chest from 05/24/20 showed Bandlike lung opacity in the anterior right upper lobe is presumably secondary to radiation therapy. Plan to monitor with repeat scan in 3 months. I personally reviewed the images and discussed the findings with patient today. She is agreeable.  -Continue Tamoxifen and  trastuzumab treatments.  -F/u on 6/9 as scheduled    2. Bone Health -Most recent bone density from 12/08/19 was -1.7. -I discussed Zometa today and gave her information. She will consider this.  3. 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 -She will f/u with Dr. Havery Moros for screening colonoscopies, last done 07/2018 with12polyps. I recommend to repeat in next 2 years.   -Shewaspreviouslyonchemoprevention withTamoxifen 10/2018-06/17/19,restarted 11/2019 following breast cancer treatment   4. S/p ileocecectomy on 08/14/15 due to benign colon polyp invading appendix, history of recurrent colon polyps  5.H/o right breast fibroadenoma,S/p rightsurgical resection on 11/22/15  7.Primary hyperparathyroidism -She reported right hip/thigh pain. I prescribed gabapentin 11/29/19. -She underwent left inferior parathyroidectomy with Dr Harlow Asa on 01/25/20. -Her pain resolved after surgery, and she no longer needs the gabapentin.   PLAN: -CT chest scan findings discussed with pt -Continue Tamoxifen  -Continue trastuzumab every 3 weeks for 3 more cycles  -labs and f/u with last trastuzumab on 07/12/20   No problem-specific Assessment & Plan notes found for this encounter.   No orders of the defined types were placed in this encounter.  I discussed  the assessment and treatment plan with the patient. The patient was provided an opportunity to ask questions and all were answered. The patient agreed with the plan and demonstrated an understanding of the instructions.  The patient was advised to call back or seek an in-person evaluation if the symptoms worsen or if the condition fails to improve as anticipated.  The total time spent in the appointment was 22  minutes.    Truitt Merle, MD 05/25/2020   I, Joslyn Devon, am acting as scribe for Truitt Merle, MD.   I have reviewed the above documentation for accuracy and completeness, and I agree with the above.

## 2020-05-24 ENCOUNTER — Other Ambulatory Visit: Payer: Self-pay

## 2020-05-24 ENCOUNTER — Ambulatory Visit (HOSPITAL_COMMUNITY)
Admission: RE | Admit: 2020-05-24 | Discharge: 2020-05-24 | Disposition: A | Discharge status: Home / Self Care | Payer: BC Managed Care – PPO | Source: Ambulatory Visit | Attending: Hematology | Admitting: Hematology

## 2020-05-24 DIAGNOSIS — J439 Emphysema, unspecified: Secondary | ICD-10-CM | POA: Diagnosis not present

## 2020-05-24 DIAGNOSIS — I2510 Atherosclerotic heart disease of native coronary artery without angina pectoris: Secondary | ICD-10-CM | POA: Diagnosis not present

## 2020-05-24 DIAGNOSIS — I700 Atherosclerosis of aorta: Secondary | ICD-10-CM | POA: Diagnosis not present

## 2020-05-24 DIAGNOSIS — C50411 Malignant neoplasm of upper-outer quadrant of right female breast: Secondary | ICD-10-CM | POA: Diagnosis not present

## 2020-05-24 DIAGNOSIS — C50911 Malignant neoplasm of unspecified site of right female breast: Secondary | ICD-10-CM | POA: Diagnosis not present

## 2020-05-24 DIAGNOSIS — Z170 Estrogen receptor positive status [ER+]: Secondary | ICD-10-CM | POA: Diagnosis not present

## 2020-05-24 IMAGING — CT CT CHEST W/O CM
2 of 4 series · 15 of 36 positions shown, 18 images · non-contrast
Comparison: None.

CLINICAL DATA: Right breast cancer.

EXAM:
CT CHEST WITHOUT CONTRAST
TECHNIQUE: Multidetector CT imaging of the chest was performed following the
standard protocol without IV contrast.

[Series 5: coronal · coronal · 0.61mm/px · 3 of 125 slices shown]
[im 25/125  lung]
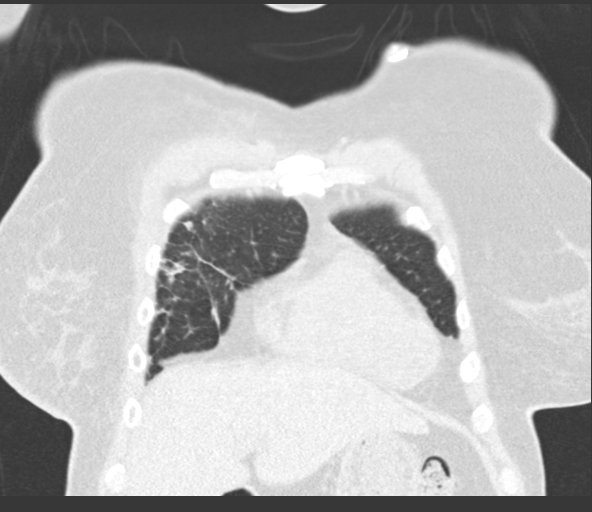
[im 50/125  lung]
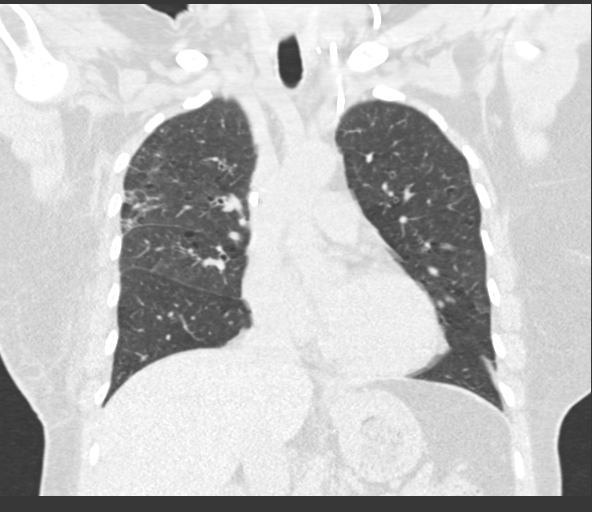
[im 75/125  lung]
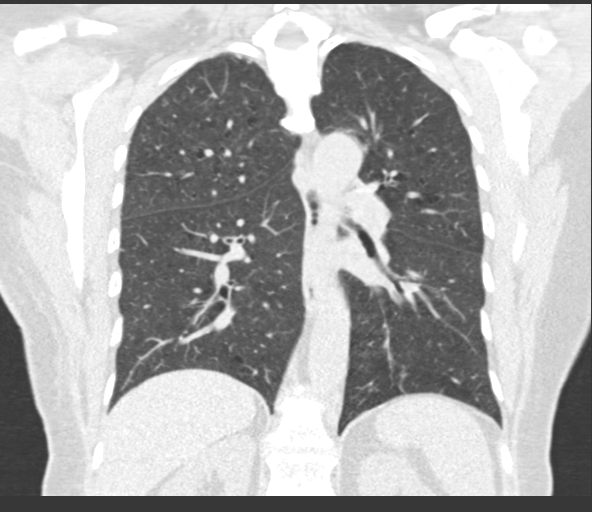

[Series 7: lungs · axial · 0.73mm/px · z∈[-320,-64]mm · 12 of 152 slices shown, 15 images]
[im 12/152  mediastinal]
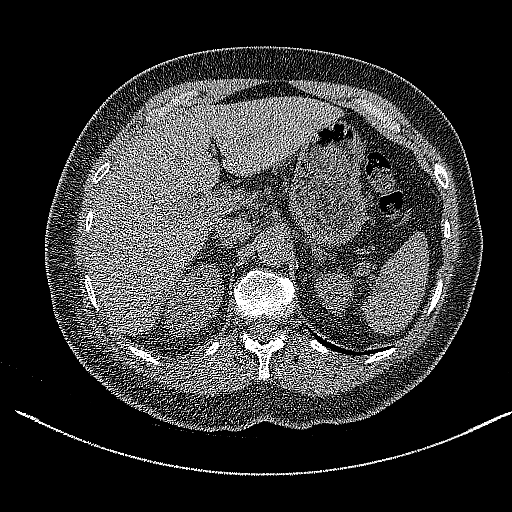
[im 12/152  lung]
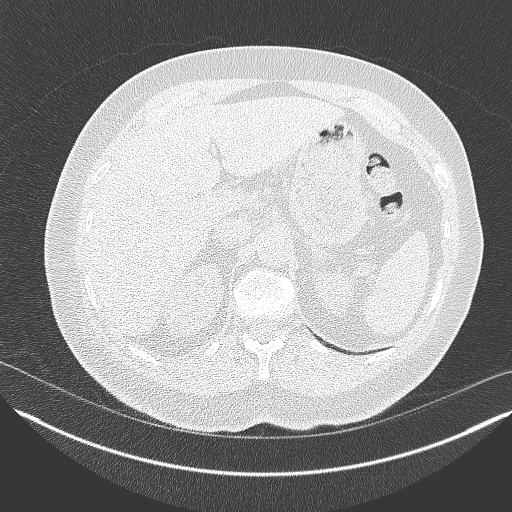
[im 24/152  lung]
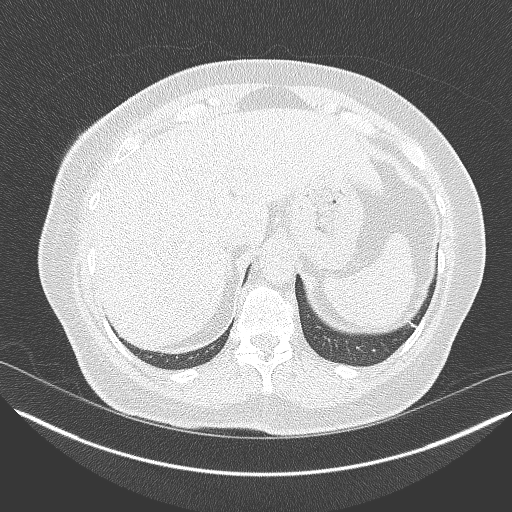
[im 35/152  lung]
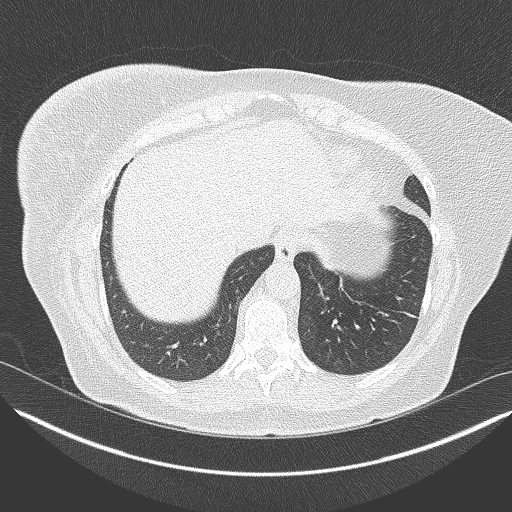
[im 47/152  lung]
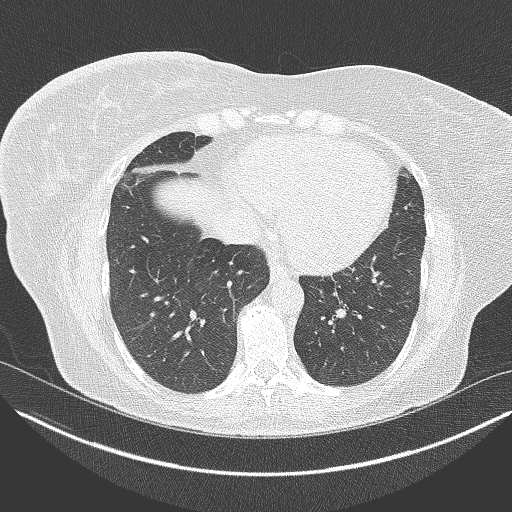
[im 59/152  mediastinal]
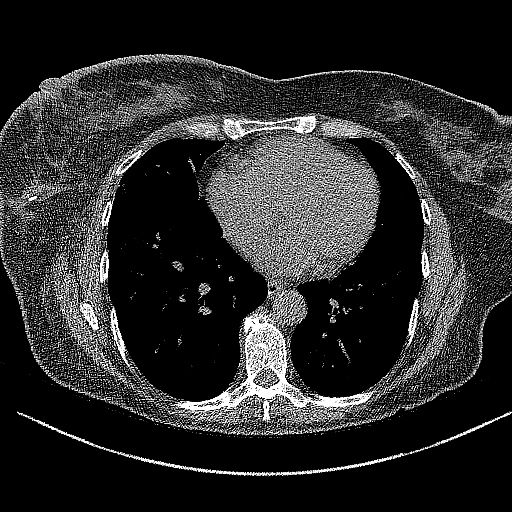
[im 59/152  lung]
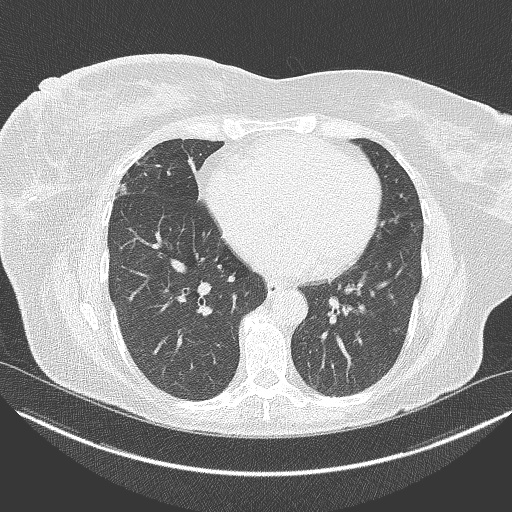
[im 70/152  lung]
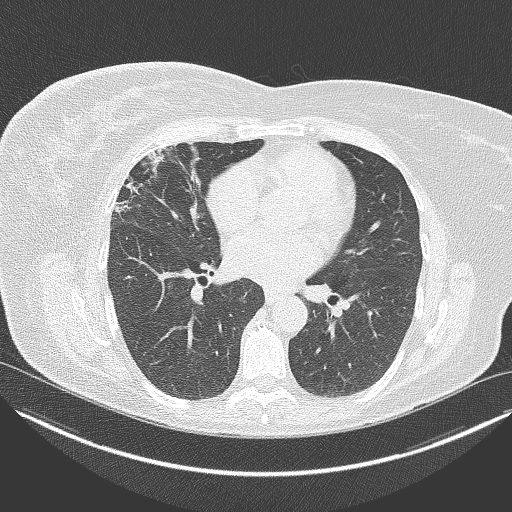
[im 82/152  lung]
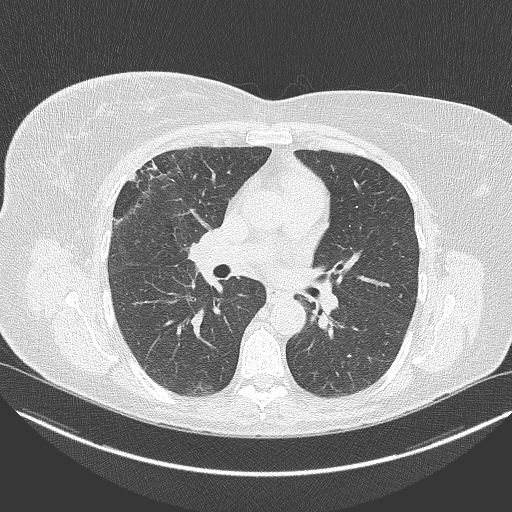
[im 93/152  lung]
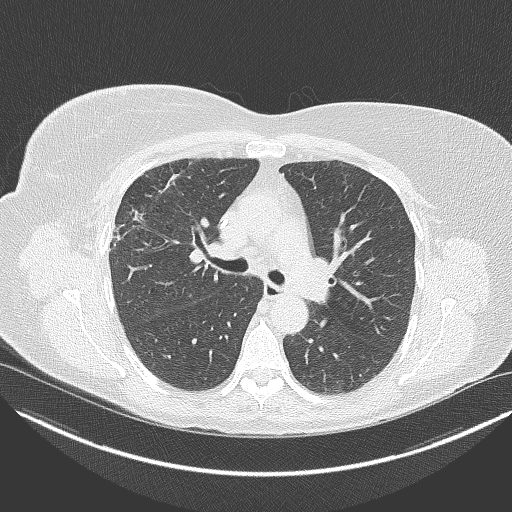
[im 105/152  mediastinal]
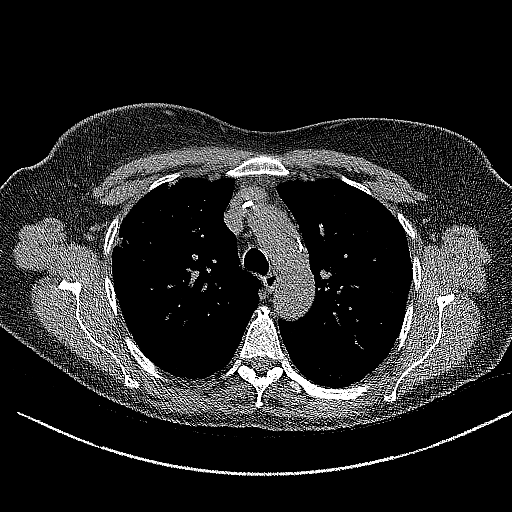
[im 105/152  lung]
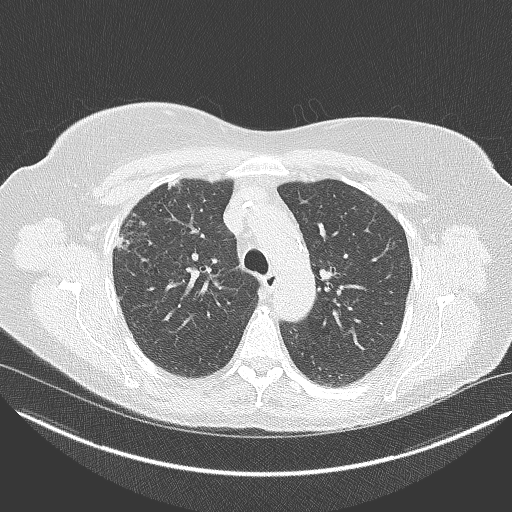
[im 117/152  lung]
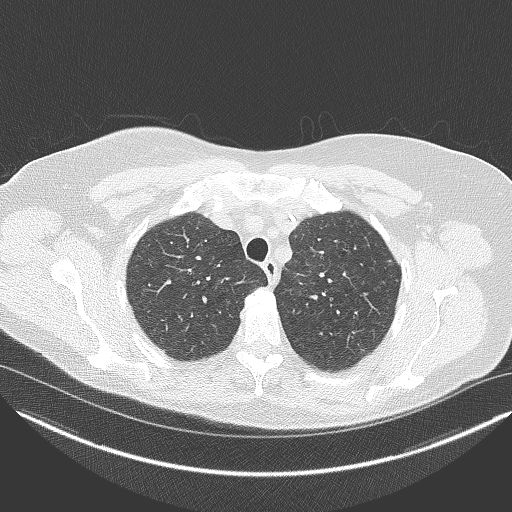
[im 128/152  lung]
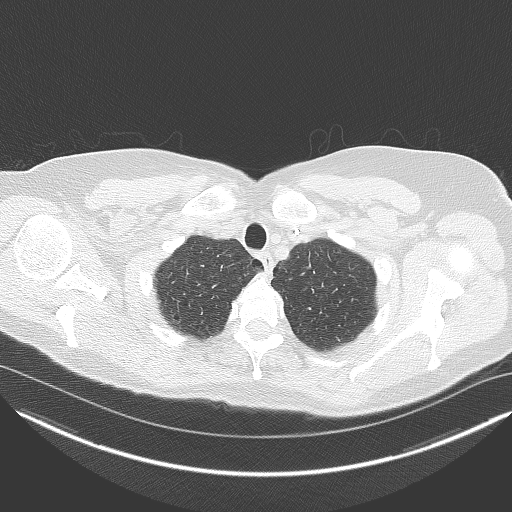
[im 140/152  lung]
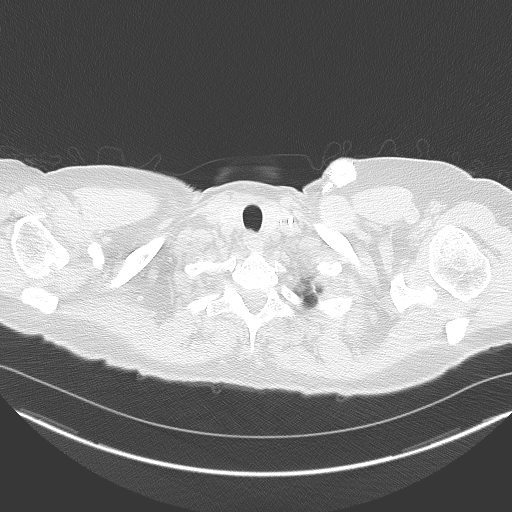

[15 of 36 positions shown; findings below may reference images not displayed]

FINDINGS: Cardiovascular: The heart size is normal. No substantial pericardial
effusion. Atherosclerotic calcification is noted in the wall of the
thoracic aorta. Left Port-A-Cath tip is positioned in the mid SVC.

Mediastinum/Nodes: No mediastinal lymphadenopathy. 9 mm short axis
precarinal node is upper normal for size. No evidence for gross
hilar lymphadenopathy although assessment is limited by the lack of
intravenous contrast on today's study. The esophagus has normal
imaging features. There is no axillary lymphadenopathy.

Lungs/Pleura: Centrilobular emphsyema noted. Bandlike lung opacity
in the anterior right upper lobe is presumably secondary to
radiation therapy. There is a 13 mm nodular component to this
finding seen in the peripheral right upper lobe on image 51/7. 4 mm
posterior right upper lobe pulmonary nodule evident on 59/7. 2 mm
left lower lobe nodule identified on 66/7. No suspicious pulmonary
nodule or mass in the left lung. Subsegmental atelectasis or linear
scarring noted left lower lobe. No focal airspace consolidation. No
pleural effusion.

Upper Abdomen: Unremarkable.

Musculoskeletal: No worrisome lytic or sclerotic osseous
abnormality. Surgical changes in skin thickening noted in the right
breast.
IMPRESSION: 1. Bandlike lung opacity in the anterior right upper lobe is
presumably secondary to radiation therapy. There is a 13 mm nodular
component in the posterior aspect of this finding. While this may be
related to the fibrosis, the appearance is certainly more nodular
than typically seen and close follow-up recommended. Consider repeat
CT chest in 3 months.
2. Additional tiny bilateral pulmonary nodules measuring up to 4 mm,
likely benign. Attention on follow-up recommended.
3. Aortic Atherosclerosis ([43]-[43]) and Emphysema ([43]-[43]).

## 2020-05-25 ENCOUNTER — Inpatient Hospital Stay (HOSPITAL_BASED_OUTPATIENT_CLINIC_OR_DEPARTMENT_OTHER): Payer: BC Managed Care – PPO | Admitting: Hematology

## 2020-05-25 DIAGNOSIS — C50411 Malignant neoplasm of upper-outer quadrant of right female breast: Secondary | ICD-10-CM | POA: Diagnosis not present

## 2020-05-25 DIAGNOSIS — Z170 Estrogen receptor positive status [ER+]: Secondary | ICD-10-CM | POA: Diagnosis not present

## 2020-05-27 ENCOUNTER — Encounter: Payer: Self-pay | Admitting: Hematology

## 2020-05-31 ENCOUNTER — Inpatient Hospital Stay: Payer: BC Managed Care – PPO

## 2020-05-31 ENCOUNTER — Other Ambulatory Visit: Payer: Self-pay

## 2020-05-31 VITALS — BP 132/75 | HR 73 | Temp 97.7°F | Resp 18

## 2020-05-31 DIAGNOSIS — C50411 Malignant neoplasm of upper-outer quadrant of right female breast: Secondary | ICD-10-CM | POA: Diagnosis not present

## 2020-05-31 DIAGNOSIS — Z170 Estrogen receptor positive status [ER+]: Secondary | ICD-10-CM

## 2020-05-31 DIAGNOSIS — Z79810 Long term (current) use of selective estrogen receptor modulators (SERMs): Secondary | ICD-10-CM | POA: Diagnosis not present

## 2020-05-31 DIAGNOSIS — Z5112 Encounter for antineoplastic immunotherapy: Secondary | ICD-10-CM | POA: Diagnosis not present

## 2020-05-31 DIAGNOSIS — E210 Primary hyperparathyroidism: Secondary | ICD-10-CM | POA: Diagnosis not present

## 2020-05-31 MED ORDER — DIPHENHYDRAMINE HCL 25 MG PO CAPS
50.0000 mg | ORAL_CAPSULE | Freq: Four times a day (QID) | ORAL | Status: DC | PRN
  Administered 2020-05-31: 50 mg via ORAL

## 2020-05-31 MED ORDER — ACETAMINOPHEN 325 MG PO TABS
650.0000 mg | ORAL_TABLET | Freq: Once | ORAL | Status: AC
Start: 2020-05-31 — End: 2020-05-31
  Administered 2020-05-31: 650 mg via ORAL

## 2020-05-31 MED ORDER — HEPARIN SOD (PORK) LOCK FLUSH 100 UNIT/ML IV SOLN
500.0000 [IU] | Freq: Once | INTRAVENOUS | Status: AC | PRN
  Administered 2020-05-31: 500 [IU]
  Filled 2020-05-31: qty 5

## 2020-05-31 MED ORDER — TRASTUZUMAB-ANNS CHEMO 150 MG IV SOLR
6.0000 mg/kg | Freq: Once | INTRAVENOUS | Status: AC
  Administered 2020-05-31: 504 mg via INTRAVENOUS
  Filled 2020-05-31: qty 24

## 2020-05-31 MED ORDER — SODIUM CHLORIDE 0.9% FLUSH
10.0000 mL | INTRAVENOUS | Status: DC | PRN
  Administered 2020-05-31: 10 mL
  Filled 2020-05-31: qty 10

## 2020-05-31 MED ORDER — DIPHENHYDRAMINE HCL 25 MG PO CAPS
ORAL_CAPSULE | ORAL | Status: AC
  Filled 2020-05-31: qty 2

## 2020-05-31 MED ORDER — SODIUM CHLORIDE 0.9 % IV SOLN
Freq: Once | INTRAVENOUS | Status: AC
  Filled 2020-05-31: qty 250

## 2020-05-31 MED ORDER — ACETAMINOPHEN 325 MG PO TABS
ORAL_TABLET | ORAL | Status: AC
  Filled 2020-05-31: qty 2

## 2020-05-31 NOTE — Patient Instructions (Addendum)
[facility]  Discharge Instructions: °Thank you for choosing [facility] to provide your oncology and hematology care.  ° °If you have a lab appointment with the Cancer Center, please go directly to the Cancer Center and check in at the registration area. °  °Wear comfortable clothing and clothing appropriate for easy access to any Portacath or PICC line.  ° °We strive to give you quality time with your provider. You may need to reschedule your appointment if you arrive late (15 or more minutes).  Arriving late affects you and other patients whose appointments are after yours.  Also, if you miss three or more appointments without notifying the office, you may be dismissed from the clinic at the provider’s discretion.    °  °For prescription refill requests, have your pharmacy contact our office and allow 72 hours for refills to be completed.   ° °Today you received the following chemotherapy and/or immunotherapy agents: Kanjinti °  °To help prevent nausea and vomiting after your treatment, we encourage you to take your nausea medication as directed. ° °BELOW ARE SYMPTOMS THAT SHOULD BE REPORTED IMMEDIATELY: °*FEVER GREATER THAN 100.4 F (38 °C) OR HIGHER °*CHILLS OR SWEATING °*NAUSEA AND VOMITING THAT IS NOT CONTROLLED WITH YOUR NAUSEA MEDICATION °*UNUSUAL SHORTNESS OF BREATH °*UNUSUAL BRUISING OR BLEEDING °*URINARY PROBLEMS (pain or burning when urinating, or frequent urination) °*BOWEL PROBLEMS (unusual diarrhea, constipation, pain near the anus) °TENDERNESS IN MOUTH AND THROAT WITH OR WITHOUT PRESENCE OF ULCERS (sore throat, sores in mouth, or a toothache) °UNUSUAL RASH, SWELLING OR PAIN  °UNUSUAL VAGINAL DISCHARGE OR ITCHING  ° °Items with * indicate a potential emergency and should be followed up as soon as possible or go to the Emergency Department if any problems should occur. ° °Please show the CHEMOTHERAPY ALERT CARD or IMMUNOTHERAPY ALERT CARD at check-in to the  Emergency Department and triage nurse. ° °Should you have questions after your visit or need to cancel or reschedule your appointment, please contact [facility]  Dept: [phone]  and follow the prompts.  Office hours are 8:00 a.m. to 4:30 p.m. Monday - Friday. Please note that voicemails left after 4:00 p.m. may not be returned until the following business day.  We are closed weekends and major holidays. You have access to a nurse at all times for urgent questions. Please call the main number to the clinic Dept: [phone] and follow the prompts. ° ° °For any non-urgent questions, you may also contact your provider using MyChart. We now offer e-Visits for anyone 18 and older to request care online for non-urgent symptoms. For details visit [email or web address]. °  °Also download the MyChart app! Go to the app store, search "MyChart", open the app, select , and log in with your MyChart username and password. ° °Due to Covid, a mask is required upon entering the hospital/clinic. If you do not have a mask, one will be given to you upon arrival. For doctor visits, patients may have 1 support person aged 18 or older with them. For treatment visits, patients cannot have anyone with them due to current Covid guidelines and our immunocompromised population.  ° °

## 2020-06-01 DIAGNOSIS — Z923 Personal history of irradiation: Secondary | ICD-10-CM | POA: Diagnosis not present

## 2020-06-01 DIAGNOSIS — Z170 Estrogen receptor positive status [ER+]: Secondary | ICD-10-CM | POA: Diagnosis not present

## 2020-06-01 DIAGNOSIS — C50411 Malignant neoplasm of upper-outer quadrant of right female breast: Secondary | ICD-10-CM | POA: Diagnosis not present

## 2020-06-21 ENCOUNTER — Inpatient Hospital Stay: Payer: BC Managed Care – PPO | Attending: Hematology

## 2020-06-21 ENCOUNTER — Other Ambulatory Visit: Payer: Self-pay

## 2020-06-21 VITALS — BP 119/61 | HR 85 | Temp 98.7°F | Resp 18 | Wt 197.5 lb

## 2020-06-21 DIAGNOSIS — C50411 Malignant neoplasm of upper-outer quadrant of right female breast: Secondary | ICD-10-CM | POA: Diagnosis not present

## 2020-06-21 DIAGNOSIS — Z170 Estrogen receptor positive status [ER+]: Secondary | ICD-10-CM

## 2020-06-21 DIAGNOSIS — Z5112 Encounter for antineoplastic immunotherapy: Secondary | ICD-10-CM | POA: Diagnosis not present

## 2020-06-21 MED ORDER — SODIUM CHLORIDE 0.9% FLUSH
10.0000 mL | INTRAVENOUS | Status: DC | PRN
  Administered 2020-06-21: 10 mL
  Filled 2020-06-21: qty 10

## 2020-06-21 MED ORDER — DIPHENHYDRAMINE HCL 25 MG PO CAPS
50.0000 mg | ORAL_CAPSULE | Freq: Four times a day (QID) | ORAL | Status: DC | PRN
  Administered 2020-06-21: 50 mg via ORAL

## 2020-06-21 MED ORDER — TRASTUZUMAB-ANNS CHEMO 150 MG IV SOLR
6.0000 mg/kg | Freq: Once | INTRAVENOUS | Status: AC
  Administered 2020-06-21: 504 mg via INTRAVENOUS
  Filled 2020-06-21: qty 24

## 2020-06-21 MED ORDER — ACETAMINOPHEN 325 MG PO TABS
ORAL_TABLET | ORAL | Status: AC
  Filled 2020-06-21: qty 2

## 2020-06-21 MED ORDER — ACETAMINOPHEN 325 MG PO TABS
650.0000 mg | ORAL_TABLET | Freq: Once | ORAL | Status: AC
Start: 2020-06-21 — End: 2020-06-21
  Administered 2020-06-21: 650 mg via ORAL

## 2020-06-21 MED ORDER — HEPARIN SOD (PORK) LOCK FLUSH 100 UNIT/ML IV SOLN
500.0000 [IU] | Freq: Once | INTRAVENOUS | Status: AC | PRN
  Administered 2020-06-21: 500 [IU]
  Filled 2020-06-21: qty 5

## 2020-06-21 MED ORDER — DIPHENHYDRAMINE HCL 25 MG PO CAPS
ORAL_CAPSULE | ORAL | Status: AC
  Filled 2020-06-21: qty 2

## 2020-06-21 MED ORDER — SODIUM CHLORIDE 0.9 % IV SOLN
Freq: Once | INTRAVENOUS | Status: AC
Start: 2020-06-21 — End: 2020-06-21
  Filled 2020-06-21: qty 250

## 2020-06-21 NOTE — Patient Instructions (Signed)
[facility]  Discharge Instructions: Thank you for choosing [facility] to provide your oncology and hematology care.   If you have a lab appointment with the Cancer Center, please go directly to the Cancer Center and check in at the registration area.   Wear comfortable clothing and clothing appropriate for easy access to any Portacath or PICC line.   We strive to give you quality time with your provider. You may need to reschedule your appointment if you arrive late (15 or more minutes).  Arriving late affects you and other patients whose appointments are after yours.  Also, if you miss three or more appointments without notifying the office, you may be dismissed from the clinic at the provider's discretion.      For prescription refill requests, have your pharmacy contact our office and allow 72 hours for refills to be completed.    Today you received the following chemotherapy and/or immunotherapy agents trastuzumab      To help prevent nausea and vomiting after your treatment, we encourage you to take your nausea medication as directed.  BELOW ARE SYMPTOMS THAT SHOULD BE REPORTED IMMEDIATELY: *FEVER GREATER THAN 100.4 F (38 C) OR HIGHER *CHILLS OR SWEATING *NAUSEA AND VOMITING THAT IS NOT CONTROLLED WITH YOUR NAUSEA MEDICATION *UNUSUAL SHORTNESS OF BREATH *UNUSUAL BRUISING OR BLEEDING *URINARY PROBLEMS (pain or burning when urinating, or frequent urination) *BOWEL PROBLEMS (unusual diarrhea, constipation, pain near the anus) TENDERNESS IN MOUTH AND THROAT WITH OR WITHOUT PRESENCE OF ULCERS (sore throat, sores in mouth, or a toothache) UNUSUAL RASH, SWELLING OR PAIN  UNUSUAL VAGINAL DISCHARGE OR ITCHING   Items with * indicate a potential emergency and should be followed up as soon as possible or go to the Emergency Department if any problems should occur.  Please show the CHEMOTHERAPY ALERT CARD or IMMUNOTHERAPY ALERT CARD at check-in to  the Emergency Department and triage nurse.  Should you have questions after your visit or need to cancel or reschedule your appointment, please contact [facility]  Dept: [phone]  and follow the prompts.  Office hours are 8:00 a.m. to 4:30 p.m. Monday - Friday. Please note that voicemails left after 4:00 p.m. may not be returned until the following business day.  We are closed weekends and major holidays. You have access to a nurse at all times for urgent questions. Please call the main number to the clinic Dept: [phone] and follow the prompts.   For any non-urgent questions, you may also contact your provider using MyChart. We now offer e-Visits for anyone 18 and older to request care online for non-urgent symptoms. For details visit [email or web address].   Also download the MyChart app! Go to the app store, search "MyChart", open the app, select , and log in with your MyChart username and password.  Due to Covid, a mask is required upon entering the hospital/clinic. If you do not have a mask, one will be given to you upon arrival. For doctor visits, patients may have 1 support person aged 18 or older with them. For treatment visits, patients cannot have anyone with them due to current Covid guidelines and our immunocompromised population.   

## 2020-07-12 ENCOUNTER — Other Ambulatory Visit: Payer: BC Managed Care – PPO

## 2020-07-12 ENCOUNTER — Inpatient Hospital Stay (HOSPITAL_BASED_OUTPATIENT_CLINIC_OR_DEPARTMENT_OTHER): Payer: BC Managed Care – PPO | Admitting: Hematology

## 2020-07-12 ENCOUNTER — Inpatient Hospital Stay: Payer: BC Managed Care – PPO | Attending: Hematology

## 2020-07-12 ENCOUNTER — Inpatient Hospital Stay: Payer: BC Managed Care – PPO

## 2020-07-12 ENCOUNTER — Encounter: Payer: Self-pay | Admitting: Hematology

## 2020-07-12 ENCOUNTER — Other Ambulatory Visit: Payer: Self-pay

## 2020-07-12 ENCOUNTER — Encounter: Payer: Self-pay | Admitting: *Deleted

## 2020-07-12 VITALS — BP 114/65 | HR 78 | Temp 97.9°F | Resp 16 | Ht 69.0 in | Wt 193.3 lb

## 2020-07-12 DIAGNOSIS — R918 Other nonspecific abnormal finding of lung field: Secondary | ICD-10-CM | POA: Diagnosis not present

## 2020-07-12 DIAGNOSIS — Z1589 Genetic susceptibility to other disease: Secondary | ICD-10-CM

## 2020-07-12 DIAGNOSIS — Z5112 Encounter for antineoplastic immunotherapy: Secondary | ICD-10-CM | POA: Insufficient documentation

## 2020-07-12 DIAGNOSIS — E210 Primary hyperparathyroidism: Secondary | ICD-10-CM | POA: Insufficient documentation

## 2020-07-12 DIAGNOSIS — Z79810 Long term (current) use of selective estrogen receptor modulators (SERMs): Secondary | ICD-10-CM | POA: Insufficient documentation

## 2020-07-12 DIAGNOSIS — Z170 Estrogen receptor positive status [ER+]: Secondary | ICD-10-CM

## 2020-07-12 DIAGNOSIS — C50411 Malignant neoplasm of upper-outer quadrant of right female breast: Secondary | ICD-10-CM

## 2020-07-12 DIAGNOSIS — Z95828 Presence of other vascular implants and grafts: Secondary | ICD-10-CM

## 2020-07-12 DIAGNOSIS — Z1501 Genetic susceptibility to malignant neoplasm of breast: Secondary | ICD-10-CM

## 2020-07-12 LAB — CBC WITH DIFFERENTIAL (CANCER CENTER ONLY)
Abs Immature Granulocytes: 0.01 10*3/uL (ref 0.00–0.07)
Basophils Absolute: 0 10*3/uL (ref 0.0–0.1)
Basophils Relative: 1 %
Eosinophils Absolute: 0.2 10*3/uL (ref 0.0–0.5)
Eosinophils Relative: 4 %
HCT: 40.9 % (ref 36.0–46.0)
Hemoglobin: 14.3 g/dL (ref 12.0–15.0)
Immature Granulocytes: 0 %
Lymphocytes Relative: 39 %
Lymphs Abs: 2 10*3/uL (ref 0.7–4.0)
MCH: 31.2 pg (ref 26.0–34.0)
MCHC: 35 g/dL (ref 30.0–36.0)
MCV: 89.3 fL (ref 80.0–100.0)
Monocytes Absolute: 0.3 10*3/uL (ref 0.1–1.0)
Monocytes Relative: 6 %
Neutro Abs: 2.7 10*3/uL (ref 1.7–7.7)
Neutrophils Relative %: 50 %
Platelet Count: 249 10*3/uL (ref 150–400)
RBC: 4.58 MIL/uL (ref 3.87–5.11)
RDW: 13 % (ref 11.5–15.5)
WBC Count: 5.3 10*3/uL (ref 4.0–10.5)
nRBC: 0 % (ref 0.0–0.2)

## 2020-07-12 LAB — CMP (CANCER CENTER ONLY)
ALT: 11 U/L (ref 0–44)
AST: 18 U/L (ref 15–41)
Albumin: 3.6 g/dL (ref 3.5–5.0)
Alkaline Phosphatase: 66 U/L (ref 38–126)
Anion gap: 9 (ref 5–15)
BUN: 12 mg/dL (ref 6–20)
CO2: 25 mmol/L (ref 22–32)
Calcium: 9.4 mg/dL (ref 8.9–10.3)
Chloride: 107 mmol/L (ref 98–111)
Creatinine: 0.94 mg/dL (ref 0.44–1.00)
GFR, Estimated: >60 mL/min (ref >60)
Glucose, Bld: 137 mg/dL — ABNORMAL HIGH (ref 70–99)
Potassium: 3.9 mmol/L (ref 3.5–5.1)
Sodium: 141 mmol/L (ref 135–145)
Total Bilirubin: <0.2 mg/dL — ABNORMAL LOW (ref 0.3–1.2)
Total Protein: 7.1 g/dL (ref 6.5–8.1)

## 2020-07-12 MED ORDER — ACETAMINOPHEN 325 MG PO TABS
ORAL_TABLET | ORAL | Status: AC
  Filled 2020-07-12: qty 2

## 2020-07-12 MED ORDER — DIPHENHYDRAMINE HCL 25 MG PO CAPS
ORAL_CAPSULE | ORAL | Status: AC
  Filled 2020-07-12: qty 2

## 2020-07-12 MED ORDER — SODIUM CHLORIDE 0.9 % IV SOLN
Freq: Once | INTRAVENOUS | Status: AC
  Filled 2020-07-12: qty 250

## 2020-07-12 MED ORDER — SODIUM CHLORIDE 0.9% FLUSH
10.0000 mL | Freq: Once | INTRAVENOUS | Status: AC
  Administered 2020-07-12: 10 mL
  Filled 2020-07-12: qty 10

## 2020-07-12 MED ORDER — TRASTUZUMAB-ANNS CHEMO 150 MG IV SOLR
6.0000 mg/kg | Freq: Once | INTRAVENOUS | Status: AC
  Administered 2020-07-12: 504 mg via INTRAVENOUS
  Filled 2020-07-12: qty 24

## 2020-07-12 MED ORDER — DIPHENHYDRAMINE HCL 25 MG PO CAPS
50.0000 mg | ORAL_CAPSULE | Freq: Once | ORAL | Status: AC
Start: 2020-07-12 — End: 2020-07-12
  Administered 2020-07-12: 50 mg via ORAL

## 2020-07-12 MED ORDER — ACETAMINOPHEN 325 MG PO TABS
650.0000 mg | ORAL_TABLET | Freq: Once | ORAL | Status: AC
  Administered 2020-07-12: 650 mg via ORAL

## 2020-07-12 NOTE — Progress Notes (Signed)
[facility]   [phone] [phone]   Clinic Follow up Note   Patient Care Team: Patient, [name] (Inactive) as PCP - General ([place]) Minus Breeding, MD as PCP - Cardiology (Cardiology) Armbruster, Carlota Raspberry, MD as Consulting Physician (Gastroenterology) Eyvonne Mechanic as Counselor (Licensed Clinical Social Worker) Truitt Merle, MD as Consulting Physician (Hematology) Alla Feeling, NP as Nurse Practitioner (Nurse Practitioner) Mauro Kaufmann, RN as Oncology Nurse Navigator Rockwell Germany, RN as Oncology Nurse Navigator Rolm Bookbinder, MD as Consulting Physician (General Surgery)  Date of Service:  07/12/2020  CHIEF COMPLAINT: f/u of right breast cancer  SUMMARY OF ONCOLOGIC HISTORY: Oncology History Overview Note  Cancer Staging Malignant neoplasm of upper-outer quadrant of right breast in female, estrogen receptor positive ([place]) Staging form: Breast, AJCC 8th Edition - Clinical stage from 06/01/2019: Stage IA (cT1c, cN0, cM0, G3, ER+, PR-, HER2+) - Signed by Truitt Merle, MD on 06/17/2019 - Pathologic stage from 06/30/2019: Stage IA (pT1c, pN0, cM0, G3, ER+, PR-, HER2+) - Signed by Truitt Merle, MD on 07/15/2019    Malignant neoplasm of upper-outer quadrant of right breast in female, estrogen receptor positive ([place])  10/2018 -  Anti-estrogen oral therapy   She was preventively started on Tamoxifen since 10/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 Held after 06/17/19 to proceed with her breast cancer surgery and chemo.    05/06/2019 Breast MRI   IMPRESSION Enhancing right breast mass suspicious for malignancy. Second look [place] if recommended.  No Suspicious enhancing left breast masses.    05/24/2019 Breast US   IMPRESSION 1.8cm irregular right breast mass within the 9:30 position 3.5cm from the nipple is highly suggestive of malignancy. An Ultrasound guided breast biopsy is recommended. The findings of the study and recommendation for biopsy  were discussed with the patient directly.    06/01/2019 Cancer Staging   Staging form: Breast, AJCC 8th Edition - Clinical stage from 06/01/2019: Stage IA (cT1c, cN0, cM0, G3, ER+, PR-, HER2+) - Signed by Truitt Merle, MD on 06/17/2019    06/01/2019 Initial Biopsy   Breast Biopsy - Right Breast Core Bx DIAGNOSIS INFILTRATING DUCT CARCINOMA WITH MINOR DUCTAL CARCINOMA IN SITU COMPNENT OF RIGHT BREAST, CORE NEEDLE BIOPSY    06/01/2019 Receptors her2   ER: greater than 75% of tumor cells show moderate staining  PR: Negative  HER2: Positive 3+ Ki67: 40%   06/17/2019 Initial Diagnosis   Malignant neoplasm of upper-outer quadrant of right breast in female, estrogen receptor positive ([place])    06/30/2019 Surgery   RIGHT BREAST LUMPECTOMY WITH RADIOACTIVE SEED AND RIGHT AXILLARY SENTINEL NODE BIOPSY and PAC placement  by Dr Donne Hazel     06/30/2019 Pathology Results   FINAL MICROSCOPIC DIAGNOSIS:   A. BREAST, RIGHT, LUMPECTOMY:  - Invasive ductal carcinoma, grade 3, spanning 1.5 cm.  - Intermediate grade ductal carcinoma in situ.  - Invasive carcinoma is 0.1-0.2 from the final lateral margin (part E).  - Margins are negative for in situ carcinoma.  - Biopsy site.  - See oncology table.   B. LYMPH NODE, RIGHT AXILLARY, SENTINEL, BIOPSY:  - One of one lymph nodes negative for carcinoma (0/1).   C. LYMPH NODE, RIGHT AXILLARY, SENTINEL, BIOPSY:  - One of one lymph nodes negative for carcinoma (0/1).   D. LYMPH NODE, RIGHT AXILLARY, SENTINEL, BIOPSY:  - One of one lymph nodes negative for carcinoma (0/1).   E. BREAST, RIGHT ADDITIONAL LATERAL MARGIN, EXCISION:  - Invasive ductal carcinoma.  -  Invasive carcinoma is 0.1-0.2 cm from the new lateral margin.   F. BREAST, RIGHT ADDITIONAL POSTERIOR MARGIN, EXCISION:  - Fibrocystic change and usual ductal hyperplasia.  - No malignancy identified.   G. BREAST, RIGHT ADDITIONAL SUPERIOR MARGIN, EXCISION:  - Fibrocystic change and usual ductal  hyperplasia.  - Incidental radial scar.  - No malignancy identified.     PROGNOSTIC INDICATOR RESULTS:  The tumor cells are POSITIVE for Her2 (3+).  Estrogen Receptor:       POSITIVE, 85%, MODERATE STAINING  Progesterone Receptor:   NEGATIVE  Proliferation Marker Ki-67:   25%    06/30/2019 Cancer Staging   Staging form: Breast, AJCC 8th Edition - Pathologic stage from 06/30/2019: Stage IA (pT1c, pN0, cM0, G3, ER+, PR-, HER2+) - Signed by Truitt Merle, MD on 07/15/2019    07/29/2019 -  Chemotherapy   Adjuvant Weekly Abraxane (Due to her insurance denial of Abraxane, I changed it to paclitaxel starting 09/15/19) and Transtuzumab for 12 weeks starting 07/29/19-10/12/19, followed by maintenance trastuzumab q3weeks starting 11/03/19 to complete 1 year of treatment. Due to headaches her trastuzumab (Ogivr) was switched to trastuzumab (Kanjinti) on 09/08/19. Plan to complete 07/12/20.     11/24/2019 -  Anti-estrogen oral therapy   Tamoxifen   12/2019 - 01/2020 Radiation Therapy   Outside Radiation in late 2021.       CURRENT THERAPY:  Maintenance trastuzumab (Kanjinti) q3weeks starting 11/03/19 to complete 1 year of treatment from 07/29/19-07/12/20 Tamoxifen, started 11/2019  INTERVAL HISTORY:  Addasyn Mcbreen is here for a follow up of breast cancer. She was last seen by me on 05/25/20. She presents to the clinic accompanied by her good friend. She reports doing well overall. She expressed lament in the end of her treatment-- she was so used to the routine and coming here with her friend.  All other systems were reviewed with the patient and are negative.  MEDICAL HISTORY:  Past Medical History:  Diagnosis Date   Cancer [facility])    right breast   Depression    Family history of colon cancer    History of kidney stones    Monoallelic mutation of CHEK2 gene in female patient    Personal history of colonic polyps    Pneumonia    S/P partial colectomy     SURGICAL HISTORY: Past Surgical History:   Procedure Laterality Date   BREAST LUMPECTOMY WITH RADIOACTIVE SEED AND SENTINEL LYMPH NODE BIOPSY Right 06/30/2019   Procedure: RIGHT BREAST LUMPECTOMY WITH RADIOACTIVE SEED AND RIGHT AXILLARY SENTINEL NODE BIOPSY;  Surgeon: Rolm Bookbinder, MD;  Location: [facility];  Service: General;  Laterality: Right;  PEC BLOCK   BREAST SURGERY     COLON SURGERY     Partial colectomy   COLONOSCOPY     PARATHYROIDECTOMY Left 01/25/2020   Procedure: LEFT INFERIOR PARATHYROIDECTOMY;  Surgeon: Armandina Gemma, MD;  Location: [facility];  Service: General;  Laterality: Left;   POLYPECTOMY     PORTACATH PLACEMENT Left 06/30/2019   Procedure: INSERTION PORT-A-CATH WITH ULTRASOUND GUIDANCE;  Surgeon: Rolm Bookbinder, MD;  Location: [place];  Service: General;  Laterality: Left;   VENTRAL HERNIA REPAIR N/A 01/25/2020   Procedure: HERNIA REPAIR VENTRAL ADULT WITH MESH;  Surgeon: Armandina Gemma, MD;  Location: [facility];  Service: General;  Laterality: N/A;    I have reviewed the social history and family history with the patient and they are unchanged from previous note.  ALLERGIES:  has No Known Allergies.  MEDICATIONS:  Current Outpatient Medications  Medication Sig Dispense Refill   Cholecalciferol (VITAMIN D) 50 MCG (2000 UT) tablet Take 2,000 Units by mouth daily.     escitalopram (LEXAPRO) 20 MG tablet Take 20 mg by mouth daily.      ibuprofen (ADVIL) 200 MG tablet Take 600 mg by mouth every 6 (six) hours as needed for headache or mild pain.     lidocaine-prilocaine (EMLA) cream Apply to affected area once (Patient taking differently: Apply 1 application topically daily as needed (port access).) 30 g 3   tamoxifen (NOLVADEX) 20 MG tablet TAKE 1 TABLET(20 MG) BY MOUTH DAILY 30 tablet 5   No current facility-administered medications for this visit.    PHYSICAL EXAMINATION: ECOG PERFORMANCE STATUS: 0 - Asymptomatic  Vitals:   07/12/20 1435  BP: 114/65  Pulse: 78   Resp: 16  Temp: 97.9 F (36.6 C)  SpO2: 99%   Filed Weights   07/12/20 1435  Weight: 193 lb 4.8 oz (87.7 kg)    GENERAL:alert, no distress and comfortable SKIN: skin color, texture, turgor are normal, no rashes or significant lesions EYES: normal, Conjunctiva are pink and non-injected, sclera clear  NECK: supple, thyroid normal size, non-tender, without nodularity LYMPH:  no palpable lymphadenopathy in the cervical, axillary  LUNGS: clear to auscultation and percussion with normal breathing effort HEART: regular rate & rhythm and no murmurs and no lower extremity edema ABDOMEN:abdomen soft, non-tender and normal bowel sounds Musculoskeletal:no cyanosis of digits and no clubbing  NEURO: alert & oriented x 3 with fluent speech, no focal motor/sensory deficits  LABORATORY DATA:  I have reviewed the data as listed CBC Latest Ref Rng & Units 07/12/2020 03/29/2020 03/08/2020  WBC 4.0 - 10.5 K/uL 5.3 5.4 8.4  Hemoglobin 12.0 - 15.0 g/dL 14.3 13.4 14.6  Hematocrit 36.0 - 46.0 % 40.9 38.5 43.1  Platelets 150 - 400 K/uL 249 236 242     CMP Latest Ref Rng & Units 07/12/2020 03/29/2020 03/08/2020  Glucose 70 - 99 mg/dL 137(H) 92 131(H)  BUN 6 - 20 mg/dL $Remove'12 14 13  'cnyUjOA$ Creatinine 0.44 - 1.00 mg/dL 0.94 0.89 0.98  Sodium 135 - 145 mmol/L 141 140 138  Potassium 3.5 - 5.1 mmol/L 3.9 3.8 4.3  Chloride 98 - 111 mmol/L 107 109 107  CO2 22 - 32 mmol/L $RemoveB'25 23 23  'pGrDmyjw$ Calcium 8.9 - 10.3 mg/dL 9.4 8.8(L) 9.0  Total Protein 6.5 - 8.1 g/dL 7.1 6.6 7.3  Total Bilirubin 0.3 - 1.2 mg/dL <0.2(L) 0.2(L) 0.2(L)  Alkaline Phos 38 - 126 U/L 66 68 81  AST 15 - 41 U/L 18 13(L) 12(L)  ALT 0 - 44 U/L $Remo'11 9 9      'BWtLs$ RADIOGRAPHIC STUDIES: I have personally reviewed the radiological images as listed and agreed with the findings in the report. No results found.   ASSESSMENT & PLAN:  Abigail Berg is a 55 y.o. female with   1. Malignant neoplasm of upper-outer quadrant of right breast, Stage 1A, p(T1cN0M0), stage IA,  ER+/PR-/HER2+, Grade 3.  -She was diagnosed in 05/2019. She is s/p right breast lumpectomy and adjuvant radiation -Given her high risk HER2 positive breast cancer, she completed chemotherapy with weekly Abraxane and trastuzumab 07/29/19-10/12/19. She started maintenance  trastuzumab (Kanjinti) on 11/03/19, given every 3 weeks until June 2022 -She started adjuvant tamoxifen in 11/2019, tolerating well overall with mild hot flashes. -She has a palpable mass in her outer right breast. MRI breast from 05/10/20 shows right breast changes to be radiation  related. Scan also showed scattered nodularity involving the anterior right lung which likely represents post radiation changes. -Her CT chest from 05/24/20 showed Bandlike lung opacity in the anterior right upper lobe is presumably secondary to radiation therapy. Plan to monitor with repeat scan in 3 months.  -She will complete trastuzumab treatments today. I will order a final echo. I can also refer her back to Dr. Donne Hazel for port removal. -Continue Tamoxifen  -She will be due for mammography in 11/2020.    2. Bone Health -Most recent bone density from 12/08/19 was -1.7. -I previously discussed Zometa and gave her information. She would like to proceed. -I discussed the importance of regular dentist checkups with her, every 6 months. She reports she is compliant with her dental care.   3. 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  -She will f/u with Dr. Havery Moros for screening colonoscopies, last done 07/2018 with 12 polyps. I recommend to repeat in next 2 years. She will contact his office this month. -She was previously on chemoprevention with Tamoxifen 10/2018-06/17/19, restarted 11/2019 following breast cancer treatment   4. S/p ileocecectomy on 08/14/15 due to benign colon polyp invading  appendix, history of recurrent colon polyps    5. H/o right breast fibroadenoma, S/p right surgical resection on 11/22/15    7. Primary hyperparathyroidism -She reported right hip/thigh pain. I prescribed gabapentin 11/29/19. -She underwent left inferior parathyroidectomy with Dr Harlow Asa on 01/25/20.  -Her pain resolved after surgery, and she no longer needs the gabapentin.     PLAN:  -Proceed with final trastuzumab today -continue tamoxifen -I will order final echocardiogram before next visit  -I will refer her back to Dr. Donne Hazel for port removal -She will reach out to Dr. Havery Moros for routine colonoscopy -f/u in late July. -start zometa on next visit     No problem-specific Assessment & Plan notes found for this encounter.   Orders Placed This Encounter  Procedures   CT Chest Wo Contrast    Standing Status:   Future    Standing Expiration Date:   07/12/2021    Order Specific Question:   Is patient pregnant?    Answer:   No    Order Specific Question:   Preferred imaging location?    Answer:   [facility]   ECHOCARDIOGRAM COMPLETE    Standing Status:   Future    Standing Expiration Date:   07/12/2021    Order Specific Question:   Where should this test be performed    Answer:   [facility]    Order Specific Question:   Perflutren DEFINITY (image enhancing agent) should be administered unless hypersensitivity or allergy exist    Answer:   Administer Perflutren    Order Specific Question:   Reason for exam-Echo    Answer:   Chemo  Z09   All questions were answered. The patient knows to call the clinic with any problems, questions or concerns. No barriers to learning was detected. The total time spent in the appointment was 30 minutes.     Truitt Merle, MD 07/12/2020   I, Wilburn Mylar, am acting as scribe for Truitt Merle, MD.   I have reviewed the above documentation for accuracy and completeness, and I agree with the above.

## 2020-07-12 NOTE — Patient Instructions (Signed)
[facility]  Discharge Instructions: Thank you for choosing [facility] to provide your oncology and hematology care.   If you have a lab appointment with the Cancer Center, please go directly to the Cancer Center and check in at the registration area.   Wear comfortable clothing and clothing appropriate for easy access to any Portacath or PICC line.   We strive to give you quality time with your provider. You may need to reschedule your appointment if you arrive late (15 or more minutes).  Arriving late affects you and other patients whose appointments are after yours.  Also, if you miss three or more appointments without notifying the office, you may be dismissed from the clinic at the provider's discretion.      For prescription refill requests, have your pharmacy contact our office and allow 72 hours for refills to be completed.    Today you received the following chemotherapy and/or immunotherapy agents trastuzumab      To help prevent nausea and vomiting after your treatment, we encourage you to take your nausea medication as directed.  BELOW ARE SYMPTOMS THAT SHOULD BE REPORTED IMMEDIATELY: *FEVER GREATER THAN 100.4 F (38 C) OR HIGHER *CHILLS OR SWEATING *NAUSEA AND VOMITING THAT IS NOT CONTROLLED WITH YOUR NAUSEA MEDICATION *UNUSUAL SHORTNESS OF BREATH *UNUSUAL BRUISING OR BLEEDING *URINARY PROBLEMS (pain or burning when urinating, or frequent urination) *BOWEL PROBLEMS (unusual diarrhea, constipation, pain near the anus) TENDERNESS IN MOUTH AND THROAT WITH OR WITHOUT PRESENCE OF ULCERS (sore throat, sores in mouth, or a toothache) UNUSUAL RASH, SWELLING OR PAIN  UNUSUAL VAGINAL DISCHARGE OR ITCHING   Items with * indicate a potential emergency and should be followed up as soon as possible or go to the Emergency Department if any problems should occur.  Please show the CHEMOTHERAPY ALERT CARD or IMMUNOTHERAPY ALERT CARD at check-in to  the Emergency Department and triage nurse.  Should you have questions after your visit or need to cancel or reschedule your appointment, please contact [facility]  Dept: [phone]  and follow the prompts.  Office hours are 8:00 a.m. to 4:30 p.m. Monday - Friday. Please note that voicemails left after 4:00 p.m. may not be returned until the following business day.  We are closed weekends and major holidays. You have access to a nurse at all times for urgent questions. Please call the main number to the clinic Dept: [phone] and follow the prompts.   For any non-urgent questions, you may also contact your provider using MyChart. We now offer e-Visits for anyone 18 and older to request care online for non-urgent symptoms. For details visit [email or web address].   Also download the MyChart app! Go to the app store, search "MyChart", open the app, select [facility], and log in with your MyChart username and password.  Due to Covid, a mask is required upon entering the hospital/clinic. If you do not have a mask, one will be given to you upon arrival. For doctor visits, patients may have 1 support person aged 18 or older with them. For treatment visits, patients cannot have anyone with them due to current Covid guidelines and our immunocompromised population.   

## 2020-07-12 NOTE — Progress Notes (Addendum)
Patient prefers not to receive Zometa today. Next time (08/30/20).  Notified PA team for authorization.  Raul Del [place], [place], BCPS, BCOP 07/12/2020 3:04 PM

## 2020-07-13 ENCOUNTER — Telehealth: Payer: Self-pay

## 2020-07-13 MED ORDER — ONDANSETRON HCL 8 MG PO TABS
ORAL_TABLET | ORAL | Status: AC
  Filled 2020-07-13: qty 1

## 2020-07-13 NOTE — Telephone Encounter (Signed)
Spoke with patient to schedule her colonoscopy. Patient has been scheduled for a pre-visit with the nurse on Thursday, /25/22 at 1 pm, she is aware that she will need to check in on the 3rd floor this day. Patient is scheduled for a colonoscopy with Dr. Havery Moros on Thursday, 10/11/20 at 2:30 PM, arriving at 1:30 PM with a care partner. Patient verbalized understanding and had no concerns at the end of the call.

## 2020-07-13 NOTE — Telephone Encounter (Signed)
-----   Message from Yetta Flock, MD sent at 07/13/2020  1:01 PM EDT ----- Got it, we will her in for a colonoscopy.  Previn Jian can you please help schedule this patient for [place] colonoscopy? Thanks  ----- Message ----- From: Truitt Merle, MD Sent: 07/12/2020  11:31 PM EDT To: Rolm Bookbinder, MD, Yetta Flock, MD, #  [facility],  She finished last dose herceptin today, please schedule port removal in a few months (she has zometa infusion in late July), thanks   Remo Lipps, she is due for colonoscopy next month, thanks   Krista Blue

## 2020-07-24 ENCOUNTER — Ambulatory Visit (HOSPITAL_COMMUNITY): Payer: BC Managed Care – PPO

## 2020-07-30 ENCOUNTER — Ambulatory Visit: Payer: BC Managed Care – PPO

## 2020-08-11 ENCOUNTER — Encounter (HOSPITAL_COMMUNITY): Payer: Self-pay

## 2020-08-13 ENCOUNTER — Ambulatory Visit: Payer: BC Managed Care – PPO

## 2020-08-21 ENCOUNTER — Encounter: Payer: Self-pay | Admitting: Licensed Clinical Social Worker

## 2020-08-21 NOTE — Progress Notes (Signed)
Update: VUS in MSH2 called [id number] has been reclassified to "Likely Benign." The report date is 08/16/2020.

## 2020-08-23 ENCOUNTER — Other Ambulatory Visit: Payer: Self-pay

## 2020-08-23 ENCOUNTER — Ambulatory Visit (HOSPITAL_COMMUNITY)
Admission: RE | Admit: 2020-08-23 | Discharge: 2020-08-23 | Disposition: A | Discharge status: Home / Self Care | Payer: BC Managed Care – PPO | Source: Ambulatory Visit | Attending: Hematology | Admitting: Hematology

## 2020-08-23 ENCOUNTER — Inpatient Hospital Stay: Payer: BC Managed Care – PPO | Attending: Hematology

## 2020-08-23 DIAGNOSIS — R918 Other nonspecific abnormal finding of lung field: Secondary | ICD-10-CM

## 2020-08-23 DIAGNOSIS — C50911 Malignant neoplasm of unspecified site of right female breast: Secondary | ICD-10-CM | POA: Diagnosis not present

## 2020-08-23 DIAGNOSIS — J439 Emphysema, unspecified: Secondary | ICD-10-CM | POA: Diagnosis not present

## 2020-08-23 DIAGNOSIS — Z0189 Encounter for other specified special examinations: Secondary | ICD-10-CM

## 2020-08-23 DIAGNOSIS — E210 Primary hyperparathyroidism: Secondary | ICD-10-CM | POA: Diagnosis not present

## 2020-08-23 DIAGNOSIS — M8580 Other specified disorders of bone density and structure, unspecified site: Secondary | ICD-10-CM | POA: Insufficient documentation

## 2020-08-23 DIAGNOSIS — R911 Solitary pulmonary nodule: Secondary | ICD-10-CM | POA: Diagnosis not present

## 2020-08-23 DIAGNOSIS — Z170 Estrogen receptor positive status [ER+]: Secondary | ICD-10-CM | POA: Insufficient documentation

## 2020-08-23 DIAGNOSIS — Z95828 Presence of other vascular implants and grafts: Secondary | ICD-10-CM

## 2020-08-23 DIAGNOSIS — Z1501 Genetic susceptibility to malignant neoplasm of breast: Secondary | ICD-10-CM

## 2020-08-23 DIAGNOSIS — Z01818 Encounter for other preprocedural examination: Secondary | ICD-10-CM | POA: Diagnosis not present

## 2020-08-23 DIAGNOSIS — C50411 Malignant neoplasm of upper-outer quadrant of right female breast: Secondary | ICD-10-CM | POA: Insufficient documentation

## 2020-08-23 DIAGNOSIS — Z1589 Genetic susceptibility to other disease: Secondary | ICD-10-CM

## 2020-08-23 DIAGNOSIS — I700 Atherosclerosis of aorta: Secondary | ICD-10-CM | POA: Diagnosis not present

## 2020-08-23 LAB — CBC WITH DIFFERENTIAL (CANCER CENTER ONLY)
Abs Immature Granulocytes: 0.01 10*3/uL (ref 0.00–0.07)
Basophils Absolute: 0 10*3/uL (ref 0.0–0.1)
Basophils Relative: 1 %
Eosinophils Absolute: 0.2 10*3/uL (ref 0.0–0.5)
Eosinophils Relative: 4 %
HCT: 40.6 % (ref 36.0–46.0)
Hemoglobin: 14.3 g/dL (ref 12.0–15.0)
Immature Granulocytes: 0 %
Lymphocytes Relative: 36 %
Lymphs Abs: 2 10*3/uL (ref 0.7–4.0)
MCH: 31.7 pg (ref 26.0–34.0)
MCHC: 35.2 g/dL (ref 30.0–36.0)
MCV: 90 fL (ref 80.0–100.0)
Monocytes Absolute: 0.5 10*3/uL (ref 0.1–1.0)
Monocytes Relative: 8 %
Neutro Abs: 2.9 10*3/uL (ref 1.7–7.7)
Neutrophils Relative %: 51 %
Platelet Count: 222 10*3/uL (ref 150–400)
RBC: 4.51 MIL/uL (ref 3.87–5.11)
RDW: 13.8 % (ref 11.5–15.5)
WBC Count: 5.5 10*3/uL (ref 4.0–10.5)
nRBC: 0 % (ref 0.0–0.2)

## 2020-08-23 LAB — CMP (CANCER CENTER ONLY)
ALT: 13 U/L (ref 0–44)
AST: 20 U/L (ref 15–41)
Albumin: 3.8 g/dL (ref 3.5–5.0)
Alkaline Phosphatase: 59 U/L (ref 38–126)
Anion gap: 7 (ref 5–15)
BUN: 14 mg/dL (ref 6–20)
CO2: 26 mmol/L (ref 22–32)
Calcium: 9.2 mg/dL (ref 8.9–10.3)
Chloride: 105 mmol/L (ref 98–111)
Creatinine: 0.99 mg/dL (ref 0.44–1.00)
GFR, Estimated: >60 mL/min (ref >60)
Glucose, Bld: 133 mg/dL — ABNORMAL HIGH (ref 70–99)
Potassium: 3.6 mmol/L (ref 3.5–5.1)
Sodium: 138 mmol/L (ref 135–145)
Total Bilirubin: 0.4 mg/dL (ref 0.3–1.2)
Total Protein: 6.9 g/dL (ref 6.5–8.1)

## 2020-08-23 LAB — ECHOCARDIOGRAM COMPLETE
Area-P 1/2: 2.83 cm2
S' Lateral: 3.9 cm

## 2020-08-23 IMAGING — CT CT CHEST W/O CM
2 of 4 series · 15 of 36 positions shown, 18 images · non-contrast
Comparison: [DATE]

CLINICAL DATA: Lung nodule follow up.  Right breast cancer.

EXAM:
CT CHEST WITHOUT CONTRAST
TECHNIQUE: Multidetector CT imaging of the chest was performed following the
standard protocol without IV contrast.

[Series 2: thorax · axial · 0.65mm/px · z∈[+1360,+1626]mm · 12 of 159 slices shown, 15 images]
[im 13/159  mediastinal]
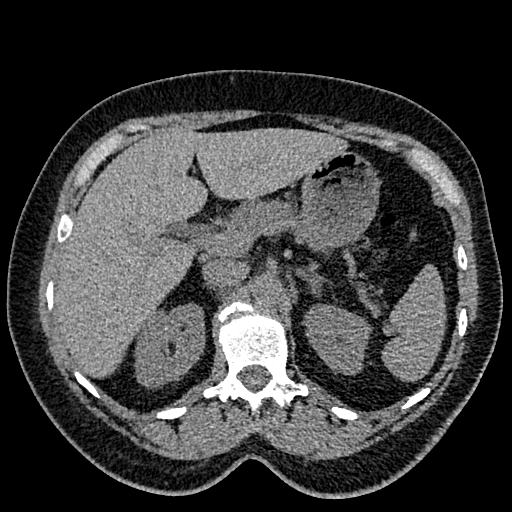
[im 13/159  lung]
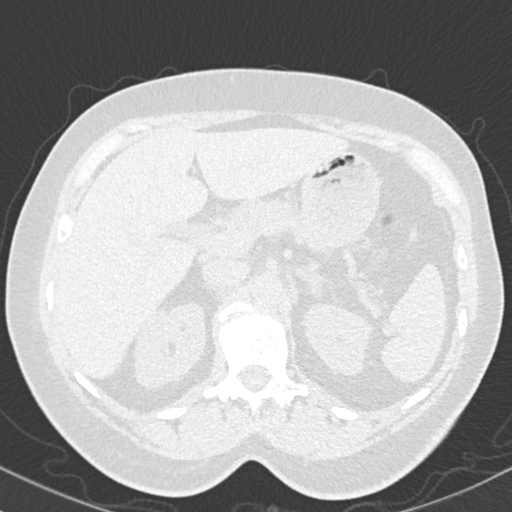
[im 25/159  lung]
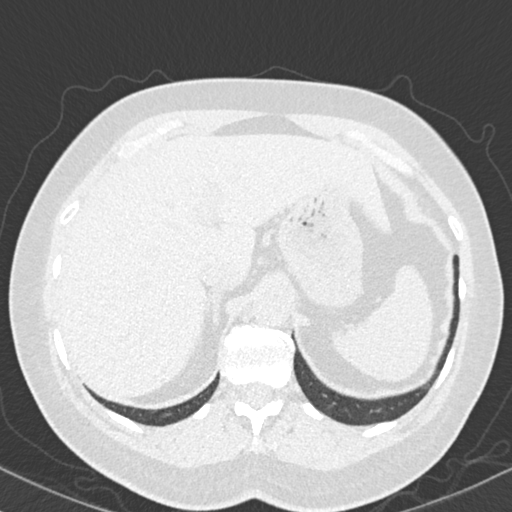
[im 37/159  lung]
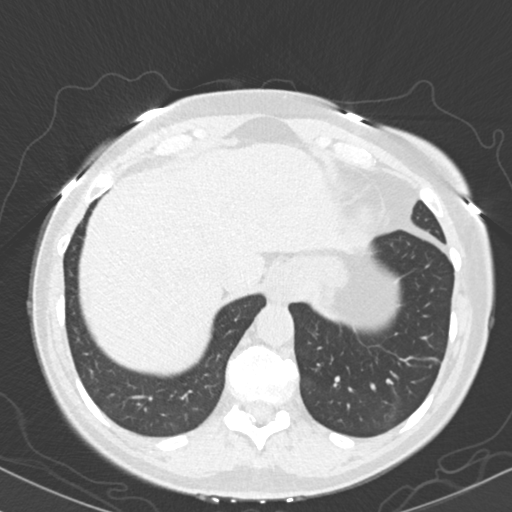
[im 49/159  lung]
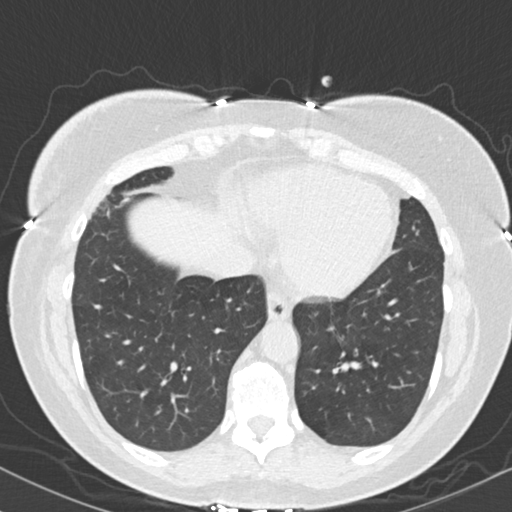
[im 61/159  mediastinal]
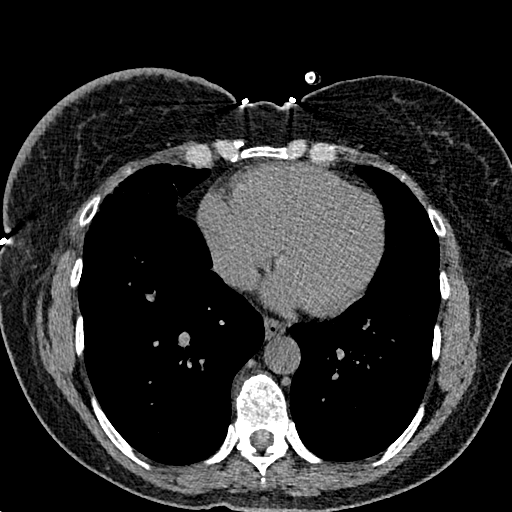
[im 61/159  lung]
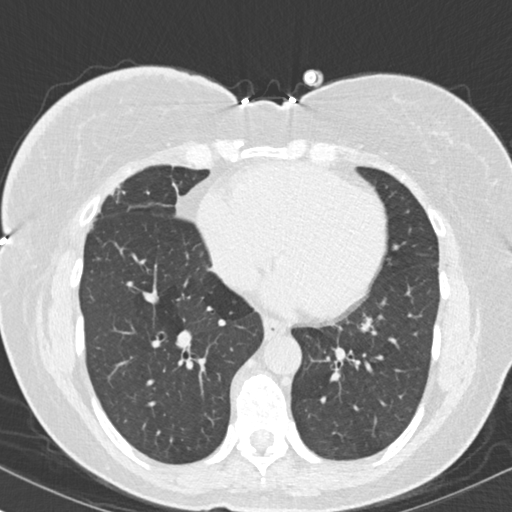
[im 73/159  lung]
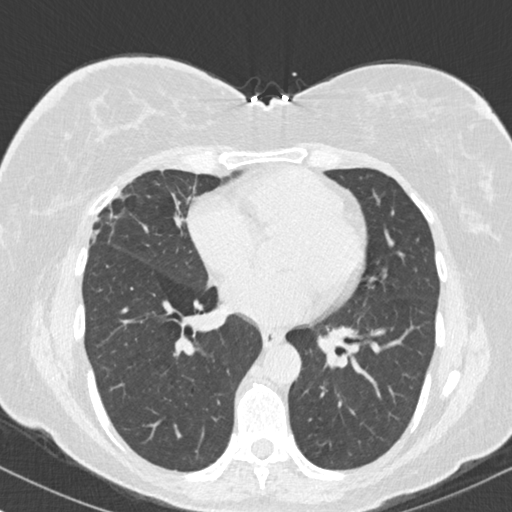
[im 86/159  lung]
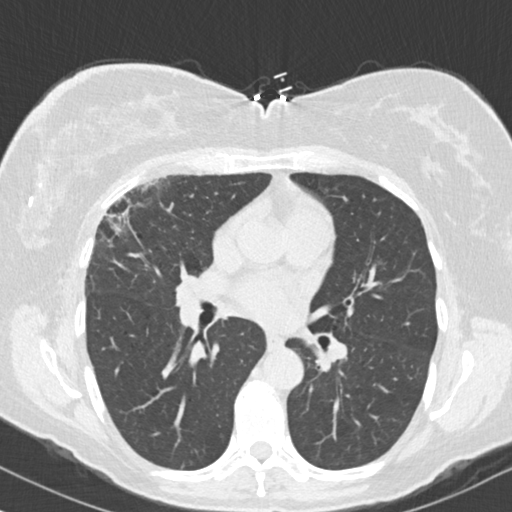
[im 98/159  lung]
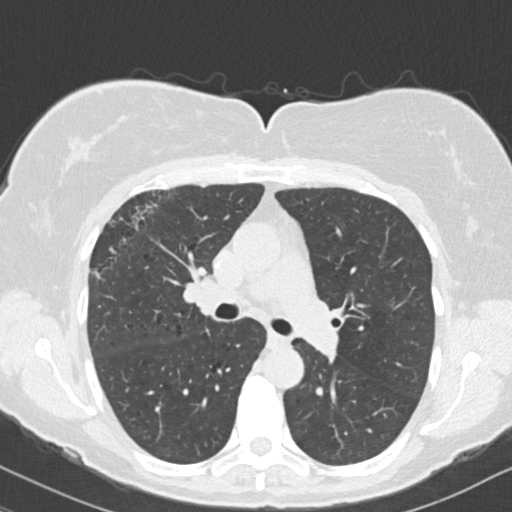
[im 110/159  mediastinal]
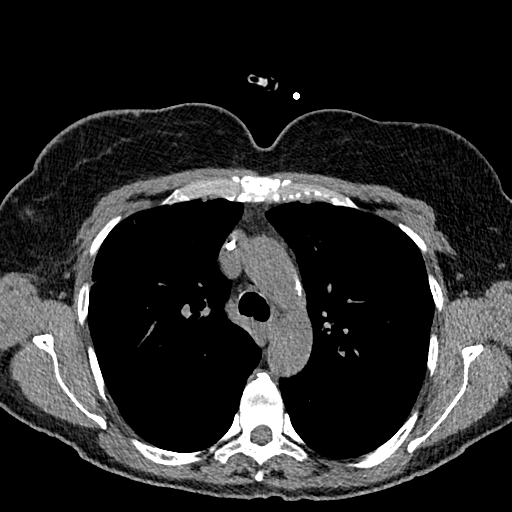
[im 110/159  lung]
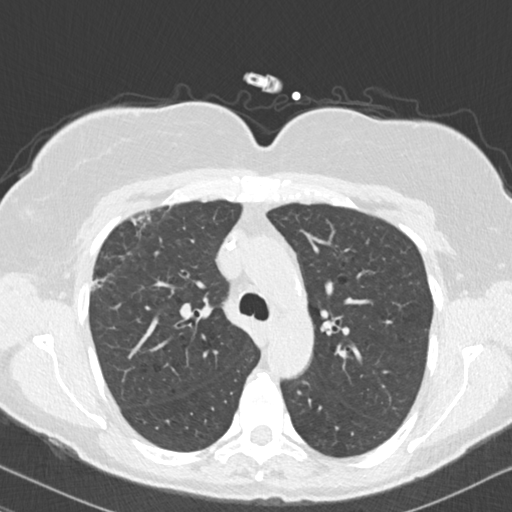
[im 122/159  lung]
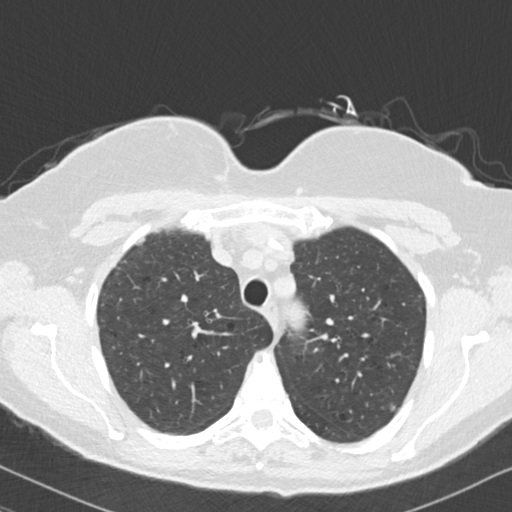
[im 134/159  lung]
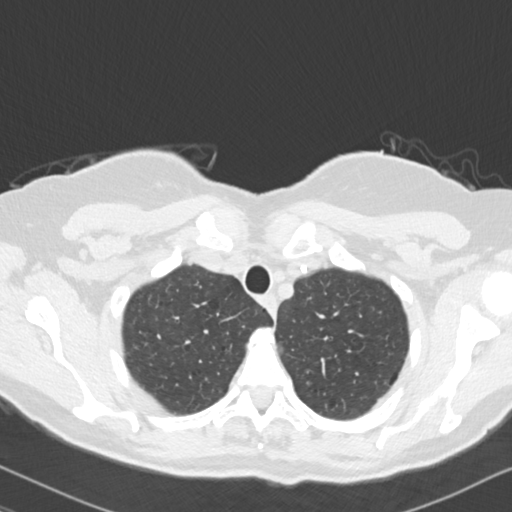
[im 146/159  lung]
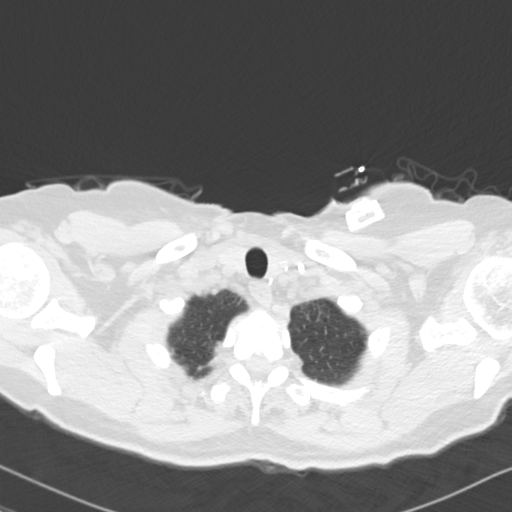

[Series 6: coronal · coronal · 0.62mm/px · 3 of 137 slices shown]
[im 28/137  lung]
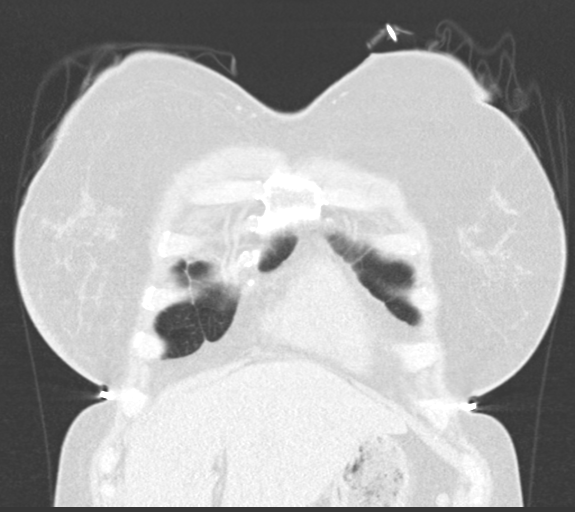
[im 55/137  lung]
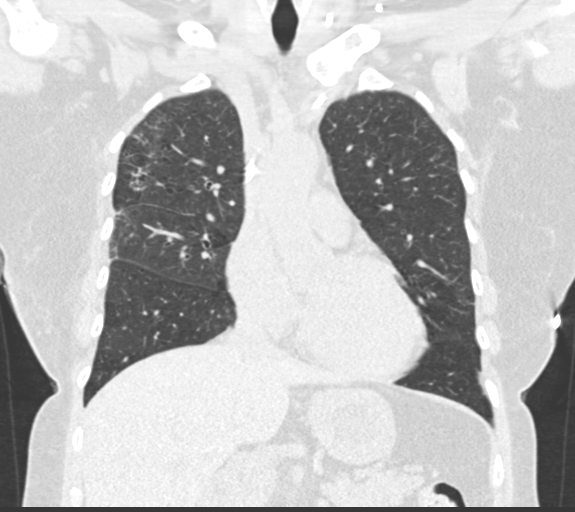
[im 82/137  lung]
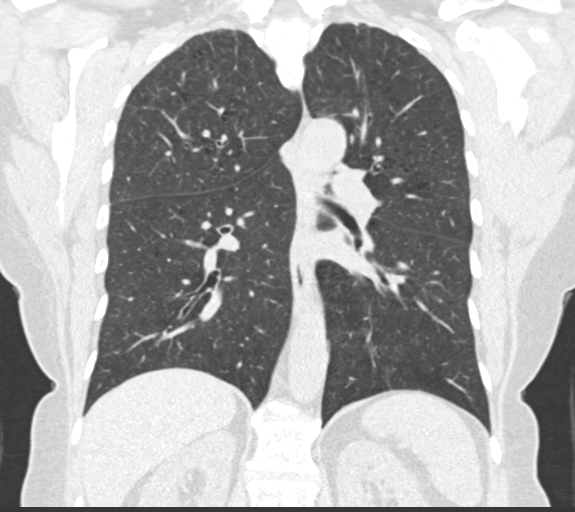

[15 of 36 positions shown; findings below may reference images not displayed]

FINDINGS: Cardiovascular: Left Port-A-Cath tip: SVC. Mild atherosclerotic
calcification of the aortic arch.

Mediastinum/Nodes: Right lower paratracheal node anterior to the
carina 0.8 cm in short axis on image 56 series 2, previously 0.9 cm.
Small type 1 hiatal hernia. No pathologic adenopathy currently
identified.

Lungs/Pleura: Centrilobular emphysema. Band of subpleural
reticulation and interstitial accentuation anteriorly in the right
lung compatible with prior radiation therapy. Along the lateral
margin there is a nodular component measuring 7 by 6 by 7 mm (volume
my measurement.

Previous right upper lobe 4 mm nodule has basically resolved.
Previous 2 by 3 mm left lower lobe nodule in the superior segment on
image 65 of series 5 is stable.

Mild scarring in the left lower lobe.

Upper Abdomen: Unremarkable

Musculoskeletal: Thoracic spondylosis.
IMPRESSION: 1. Substantial reduction in size of the nodule along the posterior
margin of the radiation fibrosis anteriorly in the right lung. This
was previously 700 cubic mm and is currently 200 cubic mm in size.
Possibilities include improvement of an inflammatory nodule; a
neoplastic nodule which is undergone interval effective therapy; or
less likely a neoplastic nodule which is spontaneously involuted.
Consider surveillance CT chest in 6-12 months time, or as
appropriate in the context of the patient's history of breast
cancer.
2. A 4 mm right upper lobe nodule has resolved. A 3 mm left lower
lobe nodule is stable.
3.  Aortic Atherosclerosis ([VW]-[VW]).
4.  Emphysema ([VW]-[VW]).

## 2020-08-23 MED ORDER — SODIUM CHLORIDE 0.9% FLUSH
10.0000 mL | Freq: Once | INTRAVENOUS | Status: AC
  Administered 2020-08-23: 10 mL
  Filled 2020-08-23: qty 10

## 2020-08-23 MED ORDER — HEPARIN SOD (PORK) LOCK FLUSH 100 UNIT/ML IV SOLN
INTRAVENOUS | Status: AC
  Filled 2020-08-23: qty 5

## 2020-08-23 NOTE — Progress Notes (Signed)
  Echocardiogram 2D Echocardiogram has been performed.  Fidel Levy 08/23/2020, 2:36 PM

## 2020-08-30 ENCOUNTER — Inpatient Hospital Stay: Payer: BC Managed Care – PPO

## 2020-08-30 ENCOUNTER — Encounter: Payer: Self-pay | Admitting: Hematology

## 2020-08-30 ENCOUNTER — Other Ambulatory Visit: Payer: Self-pay

## 2020-08-30 ENCOUNTER — Inpatient Hospital Stay (HOSPITAL_BASED_OUTPATIENT_CLINIC_OR_DEPARTMENT_OTHER): Payer: BC Managed Care – PPO | Admitting: Hematology

## 2020-08-30 VITALS — BP 114/78 | HR 83 | Temp 98.3°F | Resp 19 | Ht 69.0 in | Wt 191.3 lb

## 2020-08-30 DIAGNOSIS — C50411 Malignant neoplasm of upper-outer quadrant of right female breast: Secondary | ICD-10-CM

## 2020-08-30 DIAGNOSIS — E210 Primary hyperparathyroidism: Secondary | ICD-10-CM | POA: Diagnosis not present

## 2020-08-30 DIAGNOSIS — Z170 Estrogen receptor positive status [ER+]: Secondary | ICD-10-CM

## 2020-08-30 DIAGNOSIS — Z1501 Genetic susceptibility to malignant neoplasm of breast: Secondary | ICD-10-CM

## 2020-08-30 DIAGNOSIS — M8580 Other specified disorders of bone density and structure, unspecified site: Secondary | ICD-10-CM | POA: Diagnosis not present

## 2020-08-30 DIAGNOSIS — Z95828 Presence of other vascular implants and grafts: Secondary | ICD-10-CM

## 2020-08-30 DIAGNOSIS — R918 Other nonspecific abnormal finding of lung field: Secondary | ICD-10-CM | POA: Diagnosis not present

## 2020-08-30 DIAGNOSIS — Z1589 Genetic susceptibility to other disease: Secondary | ICD-10-CM

## 2020-08-30 LAB — CMP (CANCER CENTER ONLY)
ALT: 10 U/L (ref 0–44)
AST: 15 U/L (ref 15–41)
Albumin: 3.7 g/dL (ref 3.5–5.0)
Alkaline Phosphatase: 63 U/L (ref 38–126)
Anion gap: 7 (ref 5–15)
BUN: 13 mg/dL (ref 6–20)
CO2: 26 mmol/L (ref 22–32)
Calcium: 9.3 mg/dL (ref 8.9–10.3)
Chloride: 106 mmol/L (ref 98–111)
Creatinine: 1.12 mg/dL — ABNORMAL HIGH (ref 0.44–1.00)
GFR, Estimated: 58 mL/min — ABNORMAL LOW (ref >60)
Glucose, Bld: 121 mg/dL — ABNORMAL HIGH (ref 70–99)
Potassium: 4.1 mmol/L (ref 3.5–5.1)
Sodium: 139 mmol/L (ref 135–145)
Total Bilirubin: 0.3 mg/dL (ref 0.3–1.2)
Total Protein: 6.8 g/dL (ref 6.5–8.1)

## 2020-08-30 LAB — CBC WITH DIFFERENTIAL (CANCER CENTER ONLY)
Abs Immature Granulocytes: 0.01 10*3/uL (ref 0.00–0.07)
Basophils Absolute: 0 10*3/uL (ref 0.0–0.1)
Basophils Relative: 1 %
Eosinophils Absolute: 0.2 10*3/uL (ref 0.0–0.5)
Eosinophils Relative: 2 %
HCT: 40.7 % (ref 36.0–46.0)
Hemoglobin: 14.1 g/dL (ref 12.0–15.0)
Immature Granulocytes: 0 %
Lymphocytes Relative: 33 %
Lymphs Abs: 2.2 10*3/uL (ref 0.7–4.0)
MCH: 31.5 pg (ref 26.0–34.0)
MCHC: 34.6 g/dL (ref 30.0–36.0)
MCV: 90.8 fL (ref 80.0–100.0)
Monocytes Absolute: 0.4 10*3/uL (ref 0.1–1.0)
Monocytes Relative: 6 %
Neutro Abs: 3.8 10*3/uL (ref 1.7–7.7)
Neutrophils Relative %: 58 %
Platelet Count: 216 10*3/uL (ref 150–400)
RBC: 4.48 MIL/uL (ref 3.87–5.11)
RDW: 13.7 % (ref 11.5–15.5)
WBC Count: 6.6 10*3/uL (ref 4.0–10.5)
nRBC: 0 % (ref 0.0–0.2)

## 2020-08-30 MED ORDER — TAMOXIFEN CITRATE 20 MG PO TABS: 20.0000 mg | ORAL_TABLET | Freq: Every day | ORAL | 1 refills | Status: DC

## 2020-08-30 MED ORDER — SODIUM CHLORIDE 0.9 % IV SOLN
Freq: Once | INTRAVENOUS | Status: AC
  Filled 2020-08-30: qty 250

## 2020-08-30 MED ORDER — ZOLEDRONIC ACID 4 MG/100ML IV SOLN
INTRAVENOUS | Status: AC
  Filled 2020-08-30: qty 100

## 2020-08-30 MED ORDER — ZOLEDRONIC ACID 4 MG/100ML IV SOLN
4.0000 mg | Freq: Once | INTRAVENOUS | Status: AC
  Administered 2020-08-30: 4 mg via INTRAVENOUS

## 2020-08-30 MED ORDER — HEPARIN SOD (PORK) LOCK FLUSH 100 UNIT/ML IV SOLN
500.0000 [IU] | Freq: Once | INTRAVENOUS | Status: AC
  Administered 2020-08-30: 500 [IU]
  Filled 2020-08-30: qty 5

## 2020-08-30 MED ORDER — SODIUM CHLORIDE 0.9% FLUSH
10.0000 mL | Freq: Once | INTRAVENOUS | Status: AC
  Administered 2020-08-30: 10 mL
  Filled 2020-08-30: qty 10

## 2020-08-30 NOTE — Progress Notes (Signed)
[place]   [phone] Fax:([phone]) [phone]   Clinic Follow up Note   Patient Care Team: Patient, [name] (Inactive) as PCP - General ([place]) Minus Breeding, MD as PCP - Cardiology (Cardiology) Armbruster, Carlota Raspberry, MD as Consulting Physician (Gastroenterology) Eyvonne Mechanic as Counselor (Licensed Clinical Social Worker) Truitt Merle, MD as Consulting Physician (Hematology) Alla Feeling, NP as Nurse Practitioner (Nurse Practitioner) Mauro Kaufmann, RN as Oncology Nurse Navigator Rockwell Germany, RN as Oncology Nurse Navigator Rolm Bookbinder, MD as Consulting Physician (General Surgery)  Date of Service:  08/30/2020  CHIEF COMPLAINT: f/u of right breast cancer  SUMMARY OF ONCOLOGIC HISTORY: Oncology History Overview Note  Cancer Staging Malignant neoplasm of upper-outer quadrant of right breast in female, estrogen receptor positive ([place]) Staging form: Breast, AJCC 8th Edition - Clinical stage from 06/01/2019: Stage IA (cT1c, cN0, cM0, G3, ER+, PR-, HER2+) - Signed by Truitt Merle, MD on 06/17/2019 - Pathologic stage from 06/30/2019: Stage IA (pT1c, pN0, cM0, G3, ER+, PR-, HER2+) - Signed by Truitt Merle, MD on 07/15/2019    Malignant neoplasm of upper-outer quadrant of right breast in female, estrogen receptor positive ([place])  10/2018 - 06/17/2019 Anti-estrogen oral therapy   She was preventively started on Tamoxifen since 10/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 Held after 06/17/19 to proceed with her breast cancer surgery and chemo.    05/06/2019 Breast MRI   IMPRESSION Enhancing right breast mass suspicious for malignancy. Second look [place] if recommended.  No Suspicious enhancing left breast masses.    05/24/2019 Breast US   IMPRESSION 1.8cm irregular right breast mass within the 9:30 position 3.5cm from the nipple is highly suggestive of malignancy. An Ultrasound guided breast biopsy is recommended. The findings of the study and recommendation for  biopsy were discussed with the patient directly.    06/01/2019 Cancer Staging   Staging form: Breast, AJCC 8th Edition - Clinical stage from 06/01/2019: Stage IA (cT1c, cN0, cM0, G3, ER+, PR-, HER2+) - Signed by Truitt Merle, MD on 06/17/2019    06/01/2019 Initial Biopsy   Breast Biopsy - Right Breast Core Bx DIAGNOSIS INFILTRATING DUCT CARCINOMA WITH MINOR DUCTAL CARCINOMA IN SITU COMPNENT OF RIGHT BREAST, CORE NEEDLE BIOPSY    06/01/2019 Receptors her2   ER: greater than 75% of tumor cells show moderate staining  PR: Negative  HER2: Positive 3+ Ki67: 40%   06/17/2019 Initial Diagnosis   Malignant neoplasm of upper-outer quadrant of right breast in female, estrogen receptor positive ([place])    06/30/2019 Surgery   RIGHT BREAST LUMPECTOMY WITH RADIOACTIVE SEED AND RIGHT AXILLARY SENTINEL NODE BIOPSY and PAC placement  by Dr Donne Hazel     06/30/2019 Pathology Results   FINAL MICROSCOPIC DIAGNOSIS:   A. BREAST, RIGHT, LUMPECTOMY:  - Invasive ductal carcinoma, grade 3, spanning 1.5 cm.  - Intermediate grade ductal carcinoma in situ.  - Invasive carcinoma is 0.1-0.2 from the final lateral margin (part E).  - Margins are negative for in situ carcinoma.  - Biopsy site.  - See oncology table.   B. LYMPH NODE, RIGHT AXILLARY, SENTINEL, BIOPSY:  - One of one lymph nodes negative for carcinoma (0/1).   C. LYMPH NODE, RIGHT AXILLARY, SENTINEL, BIOPSY:  - One of one lymph nodes negative for carcinoma (0/1).   D. LYMPH NODE, RIGHT AXILLARY, SENTINEL, BIOPSY:  - One of one lymph nodes negative for carcinoma (0/1).   E. BREAST, RIGHT ADDITIONAL LATERAL MARGIN, EXCISION:  - Invasive ductal carcinoma.  -  Invasive carcinoma is 0.1-0.2 cm from the new lateral margin.   F. BREAST, RIGHT ADDITIONAL POSTERIOR MARGIN, EXCISION:  - Fibrocystic change and usual ductal hyperplasia.  - No malignancy identified.   G. BREAST, RIGHT ADDITIONAL SUPERIOR MARGIN, EXCISION:  - Fibrocystic change and usual  ductal hyperplasia.  - Incidental radial scar.  - No malignancy identified.     PROGNOSTIC INDICATOR RESULTS:  The tumor cells are POSITIVE for Her2 (3+).  Estrogen Receptor:       POSITIVE, 85%, MODERATE STAINING  Progesterone Receptor:   NEGATIVE  Proliferation Marker Ki-67:   25%    06/30/2019 Cancer Staging   Staging form: Breast, AJCC 8th Edition - Pathologic stage from 06/30/2019: Stage IA (pT1c, pN0, cM0, G3, ER+, PR-, HER2+) - Signed by Truitt Merle, MD on 07/15/2019    07/29/2019 - 07/12/2020 Chemotherapy   Adjuvant Weekly Abraxane (Due to her insurance denial of Abraxane, I changed it to paclitaxel starting 09/15/19) and Transtuzumab for 12 weeks starting 07/29/19-10/12/19, followed by maintenance trastuzumab q3weeks starting 11/03/19 to complete 1 year of treatment. Due to headaches her trastuzumab (Ogivr) was switched to trastuzumab Calla Kicks) on 09/08/19.     11/24/2019 -  Anti-estrogen oral therapy   Tamoxifen   12/2019 - 01/2020 Radiation Therapy   Outside Radiation in late 2021.       CURRENT THERAPY:  Tamoxifen, started 11/2019  INTERVAL HISTORY:  Abigail Berg is here for a follow up of breast cancer. She was last seen by me on 07/12/20. She presents to the clinic accompanied by her good friend. She reports feeling very good, except for numbness in her feet. She denies any issues walking or any pain. She rates this about a 2/10.  All other systems were reviewed with the patient and are negative.  MEDICAL HISTORY:  Past Medical History:  Diagnosis Date   Cancer [facility])    right breast   Depression    Family history of colon cancer    History of kidney stones    Monoallelic mutation of CHEK2 gene in female patient    Personal history of colonic polyps    Pneumonia    S/P partial colectomy     SURGICAL HISTORY: Past Surgical History:  Procedure Laterality Date   BREAST LUMPECTOMY WITH RADIOACTIVE SEED AND SENTINEL LYMPH NODE BIOPSY Right 06/30/2019   Procedure: RIGHT  BREAST LUMPECTOMY WITH RADIOACTIVE SEED AND RIGHT AXILLARY SENTINEL NODE BIOPSY;  Surgeon: Rolm Bookbinder, MD;  Location: [place];  Service: General;  Laterality: Right;  PEC BLOCK   BREAST SURGERY     COLON SURGERY     Partial colectomy   COLONOSCOPY     PARATHYROIDECTOMY Left 01/25/2020   Procedure: LEFT INFERIOR PARATHYROIDECTOMY;  Surgeon: Armandina Gemma, MD;  Location: [facility];  Service: General;  Laterality: Left;   POLYPECTOMY     PORTACATH PLACEMENT Left 06/30/2019   Procedure: INSERTION PORT-A-CATH WITH ULTRASOUND GUIDANCE;  Surgeon: Rolm Bookbinder, MD;  Location: [place];  Service: General;  Laterality: Left;   VENTRAL HERNIA REPAIR N/A 01/25/2020   Procedure: HERNIA REPAIR VENTRAL ADULT WITH MESH;  Surgeon: Armandina Gemma, MD;  Location: [facility];  Service: General;  Laterality: N/A;    I have reviewed the social history and family history with the patient and they are unchanged from previous note.  ALLERGIES:  has No Known Allergies.  MEDICATIONS:  Current Outpatient Medications  Medication Sig Dispense Refill   Cholecalciferol (VITAMIN D) 50 MCG (2000 UT) tablet Take  2,000 Units by mouth daily.     escitalopram (LEXAPRO) 20 MG tablet Take 20 mg by mouth daily.      ibuprofen (ADVIL) 200 MG tablet Take 600 mg by mouth every 6 (six) hours as needed for headache or mild pain.     lidocaine-prilocaine (EMLA) cream Apply to affected area once (Patient taking differently: Apply 1 application topically daily as needed (port access).) 30 g 3   tamoxifen (NOLVADEX) 20 MG tablet Take 1 tablet (20 mg total) by mouth daily. 90 tablet 1   No current facility-administered medications for this visit.    PHYSICAL EXAMINATION: ECOG PERFORMANCE STATUS: 0 - Asymptomatic  Vitals:   08/30/20 1327  BP: 114/78  Pulse: 83  Resp: 19  Temp: 98.3 F (36.8 C)  SpO2: 100%   Filed Weights   08/30/20 1327  Weight: 191 lb 4.8 oz (86.8 kg)     GENERAL:alert, no distress and comfortable SKIN: skin color, texture, turgor are normal, no rashes or significant lesions EYES: normal, Conjunctiva are pink and non-injected, sclera clear  NECK: supple, thyroid normal size, non-tender, without nodularity LYMPH:  no palpable lymphadenopathy in the cervical, axillary  LUNGS: clear to auscultation and percussion with normal breathing effort HEART: regular rate & rhythm and no murmurs and no lower extremity edema ABDOMEN:abdomen soft, non-tender and normal bowel sounds Musculoskeletal:no cyanosis of digits and no clubbing  NEURO: alert & oriented x 3 with fluent speech, no focal motor/sensory deficits BREAST: Incision in the right breast and axilla well-healed with minimal scar tissue.  No palpable mass, nodules or adenopathy bilaterally. Breast exam benign.   LABORATORY DATA:  I have reviewed the data as listed CBC Latest Ref Rng & Units 08/30/2020 08/23/2020 07/12/2020  WBC 4.0 - 10.5 K/uL 6.6 5.5 5.3  Hemoglobin 12.0 - 15.0 g/dL 14.1 14.3 14.3  Hematocrit 36.0 - 46.0 % 40.7 40.6 40.9  Platelets 150 - 400 K/uL 216 222 249     CMP Latest Ref Rng & Units 08/30/2020 08/23/2020 07/12/2020  Glucose 70 - 99 mg/dL 121(H) 133(H) 137(H)  BUN 6 - 20 mg/dL $Remove'13 14 12  'jeIhCkj$ Creatinine 0.44 - 1.00 mg/dL 1.12(H) 0.99 0.94  Sodium 135 - 145 mmol/L 139 138 141  Potassium 3.5 - 5.1 mmol/L 4.1 3.6 3.9  Chloride 98 - 111 mmol/L 106 105 107  CO2 22 - 32 mmol/L $RemoveB'26 26 25  'GRSnVQuo$ Calcium 8.9 - 10.3 mg/dL 9.3 9.2 9.4  Total Protein 6.5 - 8.1 g/dL 6.8 6.9 7.1  Total Bilirubin 0.3 - 1.2 mg/dL 0.3 0.4 <0.2(L)  Alkaline Phos 38 - 126 U/L 63 59 66  AST 15 - 41 U/L $Remo'15 20 18  'OOpME$ ALT 0 - 44 U/L $Remo'10 13 11      'sFLqy$ RADIOGRAPHIC STUDIES: I have personally reviewed the radiological images as listed and agreed with the findings in the report. No results found.   ASSESSMENT & PLAN:  Abigail Berg is a 55 y.o. female with   1. Malignant neoplasm of upper-outer quadrant of right breast,  Stage 1A, p(T1cN0M0), stage IA, ER+/PR-/HER2+, Grade 3.  -She was diagnosed in 05/2019. She is s/p right breast lumpectomy and adjuvant radiation -Given her high risk HER2 positive breast cancer, she completed chemotherapy with weekly Abraxane and trastuzumab 07/29/19-10/12/19. She started maintenance  trastuzumab (Kanjinti) on 11/03/19, given every 3 weeks through 07/12/20. -She started adjuvant tamoxifen in 11/2019, tolerating well overall with mild hot flashes. -She had a palpable mass in her outer right breast. MRI breast from 05/10/20  shows right breast changes to be radiation related.  -Echo from 08/23/20 was unremarkable, reviewed with pt today  -Continue Tamoxifen. We discussed possibly switching to anastrozole after she became postmenopausal. Her last period was in 02/2019, prior to her cancer diagnosis. Plan to check [facility] next year  -She will be due for mammography in 11/2020. She is looking to change GYN, which is where she previously had them done. Since she needs diagnostic mammogram, I can order this to be done at the breast center. She is in agreement. -she is clinically doing well. Physical exam and lab work were unremarkable.   2. Osteopenia  -Most recent bone density from 12/08/19 was -1.7. -She is scheduled to begin Zometa today, 08/30/20. -I discussed the importance of regular dentist checkups with her, every 6 months. She reports she is compliant with her dental care, with an appointment coming up.  3. Lung nodules -Her CT chest from 05/24/20 showed Bandlike lung opacity in the anterior right upper lobe is presumably secondary to radiation therapy.  -Repeat on 08/23/20 showed improvement. I reviewed with pt today. Will plan on repeat in a year.   3. 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  -She will f/u with Dr. Havery Moros for  screening colonoscopies, last done 07/2018 with 12 polyps. She is scheduled for repeat on 10/11/20. -She was previously on chemoprevention with Tamoxifen 10/2018-06/17/19, restarted 11/2019 following breast cancer treatment   4. S/p ileocecectomy on 08/14/15 due to benign colon polyp invading appendix, history of recurrent colon polyps    5. H/o right breast fibroadenoma, S/p right surgical resection on 11/22/15    7. Primary hyperparathyroidism -She reported right hip/thigh pain. I prescribed gabapentin 11/29/19. -She underwent left inferior parathyroidectomy with Dr Harlow Asa on 01/25/20.  -Her pain resolved after surgery, and she no longer needs the gabapentin.     PLAN:  -proceed with Zometa today and continue every 6 months  -continue tamoxifen, I refilled with 3 month supply today -proceed with routine colonoscopy under Dr. Havery Moros 10/11/20 -port removal 10/24/20 -mammogram in 11/2020, I will order to be done at the [facility] and f/u in 3 months   No problem-specific Assessment & Plan notes found for this encounter.   Orders Placed This Encounter  Procedures   MM DIAG BREAST TOMO BILATERAL    Standing Status:   Future    Standing Expiration Date:   08/30/2021    Order Specific Question:   Reason for Exam (SYMPTOM  OR DIAGNOSIS REQUIRED)    Answer:   screening    Order Specific Question:   Preferred imaging location?    Answer:   [facility]    Order Specific Question:   Is the patient pregnant?    Answer:   No   All questions were answered. The patient knows to call the clinic with any problems, questions or concerns. No barriers to learning was detected. The total time spent in the appointment was 30 minutes.     Truitt Merle, MD 08/30/2020   I, Wilburn Mylar, am acting as scribe for Truitt Merle, MD.   I have reviewed the above documentation for accuracy and completeness, and I agree with the above.

## 2020-09-12 ENCOUNTER — Encounter: Payer: Self-pay | Admitting: Hematology

## 2020-09-27 ENCOUNTER — Ambulatory Visit (AMBULATORY_SURGERY_CENTER): Payer: BC Managed Care – PPO | Admitting: *Deleted

## 2020-09-27 ENCOUNTER — Other Ambulatory Visit: Payer: Self-pay

## 2020-09-27 VITALS — Ht 68.0 in | Wt 190.0 lb

## 2020-09-27 DIAGNOSIS — Z86010 Personal history of colonic polyps: Secondary | ICD-10-CM

## 2020-09-27 MED ORDER — SUTAB 1479-225-188 MG PO TABS: 24.0000 | ORAL_TABLET | ORAL | 0 refills | Status: DC

## 2020-09-27 NOTE — Progress Notes (Signed)
No egg or soy allergy known to patient  No issues with past sedation with any surgeries or procedures Patient denies ever being told they had issues or difficulty with intubation  No FH of Malignant Hyperthermia No diet pills per patient No home 02 use per patient  No blood thinners per patient  Pt denies issues with constipation  No A fib or A flutter  EMMI video to pt or via [place] 19 guidelines implemented in [place] today with Pt and RN   Pt is fully vaccinated  for Covid   SUTAB Coupon given to pt in PV today , Code to Pharmacy and  NO PA's for preps discussed with pt In PV today  Discussed with pt there will be an out-of-pocket cost for prep and that varies from $0 to 70 +  dollars   Due to the COVID-19 pandemic we are asking patients to follow certain guidelines.  Pt aware of COVID protocols and [facility] guidelines   Pt verified name, DOB, address and insurance during PV today.  Pt mailed instruction packet of Emmi video, copy of consent form to read and not return, and instructions. SUTAB  coupon mailed in packet. PV completed over the phone.  Pt encouraged to call with questions or issues.  My Chart instructions to pt as well

## 2020-10-11 ENCOUNTER — Other Ambulatory Visit: Payer: Self-pay

## 2020-10-11 ENCOUNTER — Ambulatory Visit (AMBULATORY_SURGERY_CENTER): Payer: BC Managed Care – PPO | Admitting: Gastroenterology

## 2020-10-11 ENCOUNTER — Encounter: Payer: Self-pay | Admitting: Gastroenterology

## 2020-10-11 VITALS — BP 136/82 | HR 76 | Temp 98.4°F | Resp 18 | Ht 69.0 in | Wt 180.0 lb

## 2020-10-11 DIAGNOSIS — K635 Polyp of colon: Secondary | ICD-10-CM

## 2020-10-11 DIAGNOSIS — Z86010 Personal history of colonic polyps: Secondary | ICD-10-CM | POA: Diagnosis not present

## 2020-10-11 DIAGNOSIS — Z1211 Encounter for screening for malignant neoplasm of colon: Secondary | ICD-10-CM | POA: Diagnosis not present

## 2020-10-11 DIAGNOSIS — D125 Benign neoplasm of sigmoid colon: Secondary | ICD-10-CM

## 2020-10-11 HISTORY — PX: COLONOSCOPY: SHX174

## 2020-10-11 MED ORDER — SODIUM CHLORIDE 0.9 % IV SOLN
500.0000 mL | Freq: Once | INTRAVENOUS | Status: DC
Start: 2020-10-11 — End: 2020-10-11

## 2020-10-11 NOTE — Progress Notes (Signed)
Report to PACU, RN, vss, BBS= Clear.  

## 2020-10-11 NOTE — Patient Instructions (Signed)
Please read handouts provided. Continue present medications. Await pathology results.   YOU HAD AN ENDOSCOPIC PROCEDURE TODAY AT [facility]:   Refer to the procedure report that was given to you for any specific questions about what was found during the examination.  If the procedure report does not answer your questions, please call your gastroenterologist to clarify.  If you requested that your care partner not be given the details of your procedure findings, then the procedure report has been included in a sealed envelope for you to review at your convenience later.  YOU SHOULD EXPECT: Some feelings of bloating in the abdomen. Passage of more gas than usual.  Walking can help get rid of the air that was put into your GI tract during the procedure and reduce the bloating. If you had a lower endoscopy (such as a colonoscopy or flexible sigmoidoscopy) you may notice spotting of blood in your stool or on the toilet paper. If you underwent a bowel prep for your procedure, you may not have a normal bowel movement for a few days.  Please Note:  You might notice some irritation and congestion in your nose or some drainage.  This is from the oxygen used during your procedure.  There is no need for concern and it should clear up in a day or so.  SYMPTOMS TO REPORT IMMEDIATELY:  Following lower endoscopy (colonoscopy or flexible sigmoidoscopy):  Excessive amounts of blood in the stool  Significant tenderness or worsening of abdominal pains  Swelling of the abdomen that is new, acute   For urgent or emergent issues, a gastroenterologist can be reached at any hour by calling [phone]. Do not use MyChart messaging for urgent concerns.    DIET:  We do recommend a small meal at first, but then you may proceed to your regular diet.  Drink plenty of fluids but you should avoid alcoholic beverages for 24 hours.  ACTIVITY:  You should plan to take it easy for the rest of today and you  should NOT DRIVE or use heavy machinery until tomorrow (because of the sedation medicines used during the test).    FOLLOW UP: Our staff will call the number listed on your records 48-72 hours following your procedure to check on you and address any questions or concerns that you may have regarding the information given to you following your procedure. If we do not reach you, we will leave a message.  We will attempt to reach you two times.  During this call, we will ask if you have developed any symptoms of COVID 19. If you develop any symptoms (ie: fever, flu-like symptoms, shortness of breath, cough etc.) before then, please call [phone].  If you test positive for Covid 19 in the 2 weeks post procedure, please call and report this information to [place].    If any biopsies were taken you will be contacted by phone or by letter within the next 1-3 weeks.  Please call us at [phone] if you have not heard about the biopsies in 3 weeks.    SIGNATURES/CONFIDENTIALITY: You and/or your care partner have signed paperwork which will be entered into your electronic medical record.  These signatures attest to the fact that that the information above on your After Visit Summary has been reviewed and is understood.  Full responsibility of the confidentiality of this discharge information lies with you and/or your care-partner.

## 2020-10-11 NOTE — Progress Notes (Signed)
Called to room to assist during endoscopic procedure.  Patient ID and intended procedure confirmed with present staff. Received instructions for my participation in the procedure from the performing physician.  

## 2020-10-11 NOTE — Progress Notes (Signed)
Pt's states no medical or surgical changes since previsit or office visit. VS assessed by C.W 

## 2020-10-11 NOTE — Op Note (Signed)
[place] Patient Name: Annalese Stiner Procedure Date: 10/11/2020 1:55 PM MRN: [id number] Endoscopist: Remo Lipps P. Havery Moros , MD Age: 55 Referring MD:  Date of Birth: September 01, 1965 Gender: Female Account #: [id number] Procedure:                Colonoscopy Indications:              High risk colon cancer surveillance: Personal                            history of colonic polyps - 10 polyps removed 6/20,                            most adenomas, history of Chek2 mutation and breast                            cancer, multiple second degree relatives with colon                            cancer Medicines:                Monitored Anesthesia Care Procedure:                Pre-Anesthesia Assessment:                           - Prior to the procedure, a History and Physical                            was performed, and patient medications and                            allergies were reviewed. The patient's tolerance of                            previous anesthesia was also reviewed. The risks                            and benefits of the procedure and the sedation                            options and risks were discussed with the patient.                            All questions were answered, and informed consent                            was obtained. Prior Anticoagulants: The patient has                            taken no previous anticoagulant or antiplatelet                            agents. ASA Grade Assessment: III - A patient with  severe systemic disease. After reviewing the risks                            and benefits, the patient was deemed in                            satisfactory condition to undergo the procedure.                           After obtaining informed consent, the colonoscope                            was passed under direct vision. Throughout the                            procedure, the patient's blood pressure, pulse, and                             oxygen saturations were monitored continuously. The                            PCF-HQ190L Colonoscope was introduced through the                            anus and advanced to the the cecum, identified by                            appendiceal orifice and ileocecal valve. The                            colonoscopy was performed without difficulty. The                            patient tolerated the procedure well. The quality                            of the bowel preparation was adequate. The                            ileocecal valve, appendiceal orifice, and rectum                            were photographed. Scope In: 2:10:47 PM Scope Out: 2:30:58 PM Scope Withdrawal Time: 0 hours 13 minutes 49 seconds  Total Procedure Duration: 0 hours 20 minutes 11 seconds  Findings:                 The perianal and digital rectal examinations were                            normal.                           There was evidence of a prior end-to-side  ileo-colonic anastomosis in the ascending colon.                            This was patent and was characterized by healthy                            appearing mucosa.                           Five sessile polyps were found in the sigmoid                            colon. The polyps were 3 to 4 mm in size. These                            polyps were removed with a cold snare. Resection                            and retrieval were complete.                           Scattered small-mouthed diverticula were found in                            the transverse colon and left colon.                           The colon was tortuous.                           Internal hemorrhoids were found during retroflexion.                           The exam was otherwise without abnormality. Complications:            No immediate complications. Estimated blood loss:                             Minimal. Estimated Blood Loss:     Estimated blood loss was minimal. Impression:               - Patent end-to-side ileo-colonic anastomosis,                            characterized by healthy appearing mucosa.                           - Five 3 to 4 mm polyps in the sigmoid colon,                            removed with a cold snare. Resected and retrieved.                           - Diverticulosis in the transverse colon and in the  left colon.                           - Tortuous colon.                           - Internal hemorrhoids.                           - The examination was otherwise normal. Recommendation:           - Patient has a contact number available for                            emergencies. The signs and symptoms of potential                            delayed complications were discussed with the                            patient. Return to normal activities tomorrow.                            Written discharge instructions were provided to the                            patient.                           - Resume previous diet.                           - Continue present medications.                           - Await pathology results. Remo Lipps P. Chasity Outten, MD 10/11/2020 2:37:53 PM This report has been signed electronically.

## 2020-10-11 NOTE — Progress Notes (Signed)
[facility] History and Physical   Primary Care Physician:  Patient, [name] (Inactive)   Reason for Procedure:   History of colon polyps, Chek2 gene mutation  Plan:    colonoscopy     HPI: Abigail Berg is a 55 y.o. female  here for colonoscopy surveillance, numerous polyps removed over the years. Chek2 gene mutation identified. She was diagnosed with breast cancer in recent years.. Patient denies any bowel symptoms at this time. 3 second degree relatives with colon cancer . Otherwise feels well without any cardiopulmonary symptoms.    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   Personal history of colonic polyps    Pneumonia    S/P partial colectomy 2017    Past Surgical History:  Procedure Laterality Date   BREAST LUMPECTOMY WITH RADIOACTIVE SEED AND SENTINEL LYMPH NODE BIOPSY Right 06/30/2019   Procedure: RIGHT BREAST LUMPECTOMY WITH RADIOACTIVE SEED AND RIGHT AXILLARY SENTINEL NODE BIOPSY;  Surgeon: Rolm Bookbinder, MD;  Location: [place];  Service: General;  Laterality: Right;  [place] SURGERY  2017   Partial colectomy   COLONOSCOPY     PARATHYROIDECTOMY Left 01/25/2020   Procedure: LEFT INFERIOR PARATHYROIDECTOMY;  Surgeon: Armandina Gemma, MD;  Location: [facility];  Service: General;  Laterality: Left;   POLYPECTOMY     PORTACATH PLACEMENT Left 06/30/2019   Procedure: INSERTION PORT-A-CATH WITH ULTRASOUND GUIDANCE;  Surgeon: Rolm Bookbinder, MD;  Location: [place];  Service:  General;  Laterality: Left;   VENTRAL HERNIA REPAIR N/A 01/25/2020   Procedure: HERNIA REPAIR VENTRAL ADULT WITH MESH;  Surgeon: Armandina Gemma, MD;  Location: [facility];  Service: General;  Laterality: N/A;    Prior to Admission medications   Medication Sig Start Date End Date Taking? Authorizing Provider  CALCIUM PO Take by mouth.   Yes [provider]  Cholecalciferol (VITAMIN D) 50 MCG (2000 UT) tablet Take 2,000 Units by mouth daily.   Yes [provider]  escitalopram (LEXAPRO) 20 MG tablet Take 20 mg by mouth daily.    Yes [provider]  ibuprofen (ADVIL) 200 MG tablet Take 600 mg by mouth every 6 (six) hours as needed for headache or mild pain.   Yes [provider]  tamoxifen (NOLVADEX) 20 MG tablet Take 1 tablet (20 mg total) by mouth daily. 08/30/20  Yes Truitt Merle, MD  Zoledronic Acid (ZOMETA IV) Inject into the vein. INFUSION EVERY 6 MONTHS FOR BONES AFTER CHEMO FOR BREAST CANCER    [provider]    Current Outpatient Medications  Medication Sig Dispense Refill   CALCIUM PO Take by mouth.     Cholecalciferol (VITAMIN D) 50 MCG (2000 UT) tablet Take 2,000 Units by mouth daily.     escitalopram (LEXAPRO) 20 MG tablet Take 20 mg by mouth daily.      ibuprofen (ADVIL) 200 MG tablet Take 600 mg by mouth every 6 (  six) hours as needed for headache or mild pain.     tamoxifen (NOLVADEX) 20 MG tablet Take 1 tablet (20 mg total) by mouth daily. 90 tablet 1   Zoledronic Acid (ZOMETA IV) Inject into the vein. INFUSION EVERY 6 MONTHS FOR BONES AFTER CHEMO FOR BREAST CANCER     Current Facility-Administered Medications  Medication Dose Route Frequency Provider Last Rate Last Admin   0.9 %  sodium chloride infusion  500 mL Intravenous Once Tyjai Charbonnet, Carlota Raspberry, MD        Allergies as of 10/11/2020   (No Known Allergies)    Family History  Problem Relation Age of Onset   Colon cancer Maternal Grandmother 68   Colon cancer Cousin 50   Colon  cancer Maternal Grandfather 74   Cancer Other        breast cancer in MGM's sister    Colon polyps Neg Hx    Rectal cancer Neg Hx    Stomach cancer Neg Hx    Esophageal cancer Neg Hx     Social History   Socioeconomic History   Marital status: Married    Spouse name: Not on file   Number of children: Not on file   Years of education: Not on file   Highest education level: Not on file  Occupational History   Not on file  Tobacco Use   Smoking status: Every Day    Packs/day: 0.50    Years: 25.00    Pack years: 12.50    Types: Cigarettes   Smokeless tobacco: Never  Vaping Use   Vaping Use: Never used  Substance and Sexual Activity   Alcohol use: Not Currently    Comment: very rare   Drug use: Not Currently   Sexual activity: Not Currently    Birth control/protection: None  Other Topics Concern   Not on file  Social History Narrative   Not on file   Social Determinants of Health   Financial Resource Strain: Not on file  Food Insecurity: Not on file  Transportation Needs: Not on file  Physical Activity: Not on file  Stress: Not on file  Social Connections: Not on file  Intimate Partner Violence: Not on file    Review of Systems: All other review of systems negative except as mentioned in the HPI.  Physical Exam: Vital signs BP (!) 157/89   Pulse 69   Temp 98.4 F (36.9 C) (Skin)   Resp 16   Ht _0  (1.753 m)   Wt 180 lb (81.6 kg)   SpO2 98%   BMI 26.58 kg/m   General:   Alert,  Well-developed, well-nourished, pleasant and cooperative in NAD Lungs:  Clear throughout to auscultation.   Heart:  Regular rate and rhythm Abdomen:  Soft, nontender and nondistended.   Neuro/Psych:  Alert and cooperative. Normal mood and affect. A and O x 3  Jolly Mango, MD [facility]

## 2020-10-15 ENCOUNTER — Other Ambulatory Visit: Payer: Self-pay

## 2020-10-15 ENCOUNTER — Telehealth: Payer: Self-pay

## 2020-10-15 ENCOUNTER — Encounter (HOSPITAL_BASED_OUTPATIENT_CLINIC_OR_DEPARTMENT_OTHER): Payer: Self-pay | Admitting: General Surgery

## 2020-10-15 NOTE — Telephone Encounter (Signed)
Left message on answering machine. 

## 2020-10-23 ENCOUNTER — Other Ambulatory Visit: Payer: Self-pay | Admitting: General Surgery

## 2020-10-23 NOTE — Anesthesia Preprocedure Evaluation (Addendum)
Anesthesia Evaluation  Patient identified by MRN, date of birth, ID band Patient awake    Reviewed: Allergy & Precautions, NPO status , Patient's Chart, lab work & pertinent test results  History of Anesthesia Complications Negative for: history of anesthetic complications  Airway Mallampati: II  TM Distance: >3 FB Neck ROM: Full    Dental no notable dental hx. (+) Teeth Intact, Dental Advisory Given, Caps   Pulmonary Current Smoker and Patient abstained from smoking.,    Pulmonary exam normal breath sounds clear to auscultation       Cardiovascular negative cardio ROS Normal cardiovascular exam Rhythm:Regular Rate:Normal    ECHO 6/22  Left ventricular ejection fraction, by estimation, is 55 to 60%. The left ventricle has normal function. The left ventricular internal cavity size was mildly dilated. There is no left ventricular hypertrophy. Left ventricular diastolic  parameters are indeterminate.   Neuro/Psych PSYCHIATRIC DISORDERS Depression  Neuromuscular disease    GI/Hepatic negative GI ROS, Neg liver ROS,   Endo/Other  negative endocrine ROSPrimary hyperparathyroidism  Renal/GU negative Renal ROS  negative genitourinary   Musculoskeletal negative musculoskeletal ROS (+)   Abdominal   Peds negative pediatric ROS (+)  Hematology negative hematology ROS (+)   Anesthesia Other Findings  H/o breast cancer  Reproductive/Obstetrics negative OB ROS                            Anesthesia Physical  Anesthesia Plan  ASA: 2  Anesthesia Plan: MAC   Post-op Pain Management:    Induction: Intravenous  PONV Risk Score and Plan: 2 and Ondansetron, Dexamethasone, Treatment may vary due to age or medical condition and Midazolam  Airway Management Planned: Natural Airway, Nasal Cannula and Simple Face Mask  Additional Equipment: None  Intra-op Plan:   Post-operative Plan: Extubation in  OR  Informed Consent: I have reviewed the patients History and Physical, chart, labs and discussed the procedure including the risks, benefits and alternatives for the proposed anesthesia with the patient or authorized representative who has indicated his/her understanding and acceptance.     Dental advisory given  Plan Discussed with: Anesthesiologist and CRNA  Anesthesia Plan Comments:         Anesthesia Quick Evaluation

## 2020-10-24 ENCOUNTER — Other Ambulatory Visit: Payer: Self-pay

## 2020-10-24 ENCOUNTER — Ambulatory Visit (HOSPITAL_BASED_OUTPATIENT_CLINIC_OR_DEPARTMENT_OTHER): Payer: BC Managed Care – PPO | Admitting: Anesthesiology

## 2020-10-24 ENCOUNTER — Encounter (HOSPITAL_BASED_OUTPATIENT_CLINIC_OR_DEPARTMENT_OTHER)
Admission: RE | Disposition: A | Discharge status: Home / Self Care | Payer: Self-pay | Source: Home / Self Care | Attending: General Surgery

## 2020-10-24 ENCOUNTER — Ambulatory Visit (HOSPITAL_BASED_OUTPATIENT_CLINIC_OR_DEPARTMENT_OTHER)
Admission: RE | Admit: 2020-10-24 | Discharge: 2020-10-24 | Disposition: A | Discharge status: Home / Self Care | Payer: BC Managed Care – PPO | Attending: General Surgery | Admitting: General Surgery

## 2020-10-24 ENCOUNTER — Encounter (HOSPITAL_BASED_OUTPATIENT_CLINIC_OR_DEPARTMENT_OTHER): Payer: Self-pay | Admitting: General Surgery

## 2020-10-24 DIAGNOSIS — Z79899 Other long term (current) drug therapy: Secondary | ICD-10-CM | POA: Insufficient documentation

## 2020-10-24 DIAGNOSIS — F17210 Nicotine dependence, cigarettes, uncomplicated: Secondary | ICD-10-CM | POA: Diagnosis not present

## 2020-10-24 DIAGNOSIS — Z452 Encounter for adjustment and management of vascular access device: Secondary | ICD-10-CM | POA: Diagnosis not present

## 2020-10-24 DIAGNOSIS — Z803 Family history of malignant neoplasm of breast: Secondary | ICD-10-CM | POA: Insufficient documentation

## 2020-10-24 DIAGNOSIS — Z9221 Personal history of antineoplastic chemotherapy: Secondary | ICD-10-CM | POA: Diagnosis not present

## 2020-10-24 DIAGNOSIS — Z1501 Genetic susceptibility to malignant neoplasm of breast: Secondary | ICD-10-CM | POA: Diagnosis not present

## 2020-10-24 DIAGNOSIS — C50411 Malignant neoplasm of upper-outer quadrant of right female breast: Secondary | ICD-10-CM | POA: Diagnosis not present

## 2020-10-24 DIAGNOSIS — Z9049 Acquired absence of other specified parts of digestive tract: Secondary | ICD-10-CM | POA: Diagnosis not present

## 2020-10-24 DIAGNOSIS — C50919 Malignant neoplasm of unspecified site of unspecified female breast: Secondary | ICD-10-CM | POA: Insufficient documentation

## 2020-10-24 DIAGNOSIS — E559 Vitamin D deficiency, unspecified: Secondary | ICD-10-CM | POA: Diagnosis not present

## 2020-10-24 DIAGNOSIS — Z170 Estrogen receptor positive status [ER+]: Secondary | ICD-10-CM | POA: Diagnosis not present

## 2020-10-24 HISTORY — PX: PORT-A-CATH REMOVAL: SHX5289

## 2020-10-24 SURGERY — REMOVAL PORT-A-CATH
Anesthesia: Monitor Anesthesia Care | Site: Chest | Laterality: Left

## 2020-10-24 MED ORDER — LACTATED RINGERS IV SOLN: INTRAVENOUS | Status: DC

## 2020-10-24 MED ORDER — ONDANSETRON HCL 4 MG/2ML IJ SOLN
INTRAMUSCULAR | Status: AC
  Filled 2020-10-24: qty 2

## 2020-10-24 MED ORDER — BUPIVACAINE HCL (PF) 0.25 % IJ SOLN
INTRAMUSCULAR | Status: DC | PRN
  Administered 2020-10-24: 8 mL

## 2020-10-24 MED ORDER — LIDOCAINE HCL (CARDIAC) PF 100 MG/5ML IV SOSY
PREFILLED_SYRINGE | INTRAVENOUS | Status: DC | PRN
  Administered 2020-10-24: 50 mg via INTRAVENOUS

## 2020-10-24 MED ORDER — ACETAMINOPHEN 325 MG PO TABS: 325.0000 mg | ORAL_TABLET | ORAL | Status: DC | PRN

## 2020-10-24 MED ORDER — ACETAMINOPHEN 500 MG PO TABS
ORAL_TABLET | ORAL | Status: AC
  Filled 2020-10-24: qty 2

## 2020-10-24 MED ORDER — ONDANSETRON HCL 4 MG/2ML IJ SOLN: 4.0000 mg | Freq: Once | INTRAMUSCULAR | Status: DC | PRN

## 2020-10-24 MED ORDER — MIDAZOLAM HCL 5 MG/5ML IJ SOLN
INTRAMUSCULAR | Status: DC | PRN
  Administered 2020-10-24: 2 mg via INTRAVENOUS

## 2020-10-24 MED ORDER — OXYCODONE HCL 5 MG/5ML PO SOLN: 5.0000 mg | Freq: Once | ORAL | Status: DC | PRN

## 2020-10-24 MED ORDER — PHENYLEPHRINE 40 MCG/ML (10ML) SYRINGE FOR IV PUSH (FOR BLOOD PRESSURE SUPPORT)
PREFILLED_SYRINGE | INTRAVENOUS | Status: AC
  Filled 2020-10-24: qty 10

## 2020-10-24 MED ORDER — ATROPINE SULFATE 0.4 MG/ML IJ SOLN
INTRAMUSCULAR | Status: AC
  Filled 2020-10-24: qty 1

## 2020-10-24 MED ORDER — SUCCINYLCHOLINE CHLORIDE 200 MG/10ML IV SOSY
PREFILLED_SYRINGE | INTRAVENOUS | Status: AC
  Filled 2020-10-24: qty 10

## 2020-10-24 MED ORDER — MEPERIDINE HCL 25 MG/ML IJ SOLN: 6.2500 mg | INTRAMUSCULAR | Status: DC | PRN

## 2020-10-24 MED ORDER — PROPOFOL 500 MG/50ML IV EMUL
INTRAVENOUS | Status: DC | PRN
  Administered 2020-10-24 (×3): 40 mg via INTRAVENOUS

## 2020-10-24 MED ORDER — EPHEDRINE 5 MG/ML INJ
INTRAVENOUS | Status: AC
  Filled 2020-10-24: qty 5

## 2020-10-24 MED ORDER — MIDAZOLAM HCL 2 MG/2ML IJ SOLN
INTRAMUSCULAR | Status: AC
  Filled 2020-10-24: qty 2

## 2020-10-24 MED ORDER — CHLORHEXIDINE GLUCONATE CLOTH 2 % EX PADS
6.0000 | MEDICATED_PAD | Freq: Once | CUTANEOUS | Status: AC
  Administered 2020-10-24: 6 via TOPICAL

## 2020-10-24 MED ORDER — LIDOCAINE HCL (PF) 2 % IJ SOLN
INTRAMUSCULAR | Status: AC
  Filled 2020-10-24: qty 5

## 2020-10-24 MED ORDER — FENTANYL CITRATE (PF) 100 MCG/2ML IJ SOLN: 25.0000 ug | INTRAMUSCULAR | Status: DC | PRN

## 2020-10-24 MED ORDER — ACETAMINOPHEN 500 MG PO TABS
1000.0000 mg | ORAL_TABLET | ORAL | Status: AC
  Administered 2020-10-24: 1000 mg via ORAL

## 2020-10-24 MED ORDER — FENTANYL CITRATE (PF) 100 MCG/2ML IJ SOLN
INTRAMUSCULAR | Status: AC
  Filled 2020-10-24: qty 2

## 2020-10-24 MED ORDER — ONDANSETRON HCL 4 MG/2ML IJ SOLN
INTRAMUSCULAR | Status: DC | PRN
  Administered 2020-10-24: 4 mg via INTRAVENOUS

## 2020-10-24 MED ORDER — DIPHENHYDRAMINE HCL 50 MG/ML IJ SOLN
INTRAMUSCULAR | Status: DC | PRN
  Administered 2020-10-24: 6.25 mg via INTRAVENOUS

## 2020-10-24 MED ORDER — ACETAMINOPHEN 160 MG/5ML PO SOLN: 325.0000 mg | ORAL | Status: DC | PRN

## 2020-10-24 MED ORDER — OXYCODONE HCL 5 MG PO TABS: 5.0000 mg | ORAL_TABLET | Freq: Once | ORAL | Status: DC | PRN

## 2020-10-24 MED ORDER — FENTANYL CITRATE (PF) 100 MCG/2ML IJ SOLN
INTRAMUSCULAR | Status: DC | PRN
  Administered 2020-10-24 (×2): 50 ug via INTRAVENOUS

## 2020-10-24 MED ORDER — PROPOFOL 500 MG/50ML IV EMUL
INTRAVENOUS | Status: AC
  Filled 2020-10-24: qty 50

## 2020-10-24 MED ORDER — CHLORHEXIDINE GLUCONATE CLOTH 2 % EX PADS: 6.0000 | MEDICATED_PAD | Freq: Once | CUTANEOUS | Status: DC

## 2020-10-24 SURGICAL SUPPLY — 27 items
ADH SKN CLS APL DERMABOND .7 (GAUZE/BANDAGES/DRESSINGS) ×1
APL PRP STRL LF DISP 70% ISPRP (MISCELLANEOUS) ×1
BLADE SURG 15 STRL LF DISP TIS (BLADE) ×1 IMPLANT
BLADE SURG 15 STRL SS (BLADE) ×2
CHLORAPREP W/TINT 26 (MISCELLANEOUS) ×2 IMPLANT
COVER BACK TABLE 60X90IN (DRAPES) ×2 IMPLANT
COVER MAYO STAND STRL (DRAPES) ×2 IMPLANT
DECANTER SPIKE VIAL GLASS SM (MISCELLANEOUS) ×2 IMPLANT
DERMABOND ADVANCED (GAUZE/BANDAGES/DRESSINGS) ×1
DERMABOND ADVANCED .7 DNX12 (GAUZE/BANDAGES/DRESSINGS) ×1 IMPLANT
DRAPE LAPAROTOMY 100X72 PEDS (DRAPES) ×2 IMPLANT
DRAPE UTILITY XL STRL (DRAPES) ×2 IMPLANT
ELECT COATED BLADE 2.86 ST (ELECTRODE) ×2 IMPLANT
ELECT REM PT RETURN 9FT ADLT (ELECTROSURGICAL) ×2
ELECTRODE REM PT RTRN 9FT ADLT (ELECTROSURGICAL) ×1 IMPLANT
GLOVE SURG ENC MOIS LTX SZ7 (GLOVE) ×2 IMPLANT
GLOVE SURG UNDER POLY LF SZ7 (GLOVE) ×2 IMPLANT
GLOVE SURG UNDER POLY LF SZ7.5 (GLOVE) ×2 IMPLANT
GOWN STRL REUS W/ TWL LRG LVL3 (GOWN DISPOSABLE) ×2 IMPLANT
GOWN STRL REUS W/TWL LRG LVL3 (GOWN DISPOSABLE) ×4
NEEDLE HYPO 25X1 1.5 SAFETY (NEEDLE) ×2 IMPLANT
PACK BASIN DAY SURGERY FS (CUSTOM PROCEDURE TRAY) ×2 IMPLANT
PENCIL SMOKE EVACUATOR (MISCELLANEOUS) ×2 IMPLANT
SUT MNCRL AB 4-0 PS2 18 (SUTURE) ×2 IMPLANT
SUT VIC AB 3-0 SH 27 (SUTURE) ×2
SUT VIC AB 3-0 SH 27X BRD (SUTURE) ×1 IMPLANT
SYR CONTROL 10ML LL (SYRINGE) ×2 IMPLANT

## 2020-10-24 NOTE — Discharge Instructions (Addendum)
POST OP INSTRUCTIONS  Always review your discharge instruction sheet given to you by the facility where your surgery was performed.   A prescription for pain medication may be given to you upon discharge. Take your pain medication as prescribed, if needed. If narcotic pain medicine is not needed, then you make take acetaminophen (Tylenol) or ibuprofen (Advil) as needed.  Take your usually prescribed medications unless otherwise directed. If you need a refill on your pain medication, please contact our office. All narcotic pain medicine now requires a paper prescription.  Phoned in and fax refills are no longer allowed by law.  Prescriptions will not be filled after 5 pm or on weekends.  You should follow a light diet for the remainder of the day after your procedure. Most patients will experience some mild swelling and/or bruising in the area of the incision. It may take several days to resolve. It is common to experience some constipation if taking pain medication after surgery. Increasing fluid intake and taking a stool softener (such as Colace) will usually help or prevent this problem from occurring. A mild laxative (Milk of Magnesia or Miralax) should be taken according to package directions if there are no bowel movements after 48 hours.  Your surgeon used Dermabond (skin glue) on the incision, you may shower in 24 hours.  The glue will flake off over the next 2-3 weeks.  ACTIVITIES:  you may return to normal activity tomorrow.  10.You may need to see your doctor in the office for a follow-up appointment.  Please       check with your doctor.    WHEN TO CALL YOUR DOCTOR [phone]): Fever over 101.0 Chills Continued bleeding from incision Increased redness and tenderness at the site Shortness of breath, difficulty breathing   The clinic staff is available to answer your questions during regular business hours. Please don't hesitate to call and ask to speak to one of the nurses  or medical assistants for clinical concerns. If you have a medical emergency, go to the nearest emergency room or call 911.  A surgeon from [facility] is always on call at the hospital.     For further information, please visit [email or web address]     Post Anesthesia Home Care Instructions  Activity: Get plenty of rest for the remainder of the day. A responsible individual must stay with you for 24 hours following the procedure.  For the next 24 hours, DO NOT: -Drive a car -Paediatric nurse -Drink alcoholic beverages -Take any medication unless instructed by your physician -Make any legal decisions or sign important papers.  Meals: Start with liquid foods such as gelatin or soup. Progress to regular foods as tolerated. Avoid greasy, spicy, heavy foods. If nausea and/or vomiting occur, drink only clear liquids until the nausea and/or vomiting subsides. Call your physician if vomiting continues.  Special Instructions/Symptoms: Your throat may feel dry or sore from the anesthesia or the breathing tube placed in your throat during surgery. If this causes discomfort, gargle with warm salt water. The discomfort should disappear within 24 hours.  If you had a scopolamine patch placed behind your ear for the management of post- operative nausea and/or vomiting:  1. The medication in the patch is effective for 72 hours, after which it should be removed.  Wrap patch in a tissue and discard in the trash. Wash hands thoroughly with soap and water. 2. You may remove the patch earlier than 72 hours if you experience unpleasant side  effects which may include dry mouth, dizziness or visual disturbances. 3. Avoid touching the patch. Wash your hands with soap and water after contact with the patch.        Next dose of Tylenol can be given after 2:25 PM.

## 2020-10-24 NOTE — Op Note (Signed)
Preoperative diagnosis: Breast cancer, completed chemotherapy no longer needs venous access Postoperative diagnosis: Same as above Procedure: Left IJ port removal Surgeon: Dr. Serita Grammes Anesthesia: Local with monitored anesthesia care Estimated blood loss: Minimal Specimens: None Drains: None Complications: None Special count was correct completion Disposition to recovery stable condition  Indications: This a 55 year old female is completed therapy for breast cancer.  She no longer needs venous access and desires her port to be removed.  She elected to have this done under sedation.  Procedure: After informed consent was obtained the patient was taken to the operating room.  She was prepped and draped in the standard sterile surgical fashion.  A surgical timeout was then performed.  I infiltrated Marcaine at the site of her prior incision.  I entered this and remove the port, line, and suture material in their entirety.  This was then closed with 3-0 Vicryl for Monocryl.  Glue and a Steri-Strip were applied.  She tolerated this well and was transferred to recovery stable.

## 2020-10-24 NOTE — H&P (Addendum)
Abigail Berg is an 55 y.o. female.   Chief Complaint: breast cancer HPI: 89 yof completed therapy for breast cancer now no longer needs venous access.  Plan for port removal.  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   Personal history of colonic polyps    Pneumonia    S/P partial colectomy 2017    Past Surgical History:  Procedure Laterality Date   BREAST LUMPECTOMY WITH RADIOACTIVE SEED AND SENTINEL LYMPH NODE BIOPSY Right 06/30/2019   Procedure: RIGHT BREAST LUMPECTOMY WITH RADIOACTIVE SEED AND RIGHT AXILLARY SENTINEL NODE BIOPSY;  Surgeon: Rolm Bookbinder, MD;  Location: [place];  Service: General;  Laterality: Right;  [place] SURGERY  2017   Partial colectomy   COLONOSCOPY     PARATHYROIDECTOMY Left 01/25/2020   Procedure: LEFT INFERIOR PARATHYROIDECTOMY;  Surgeon: Armandina Gemma, MD;  [facility];  Service: General;  Laterality: Left;   POLYPECTOMY     PORTACATH PLACEMENT Left 06/30/2019   Procedure: INSERTION PORT-A-CATH WITH ULTRASOUND GUIDANCE;  Surgeon: Rolm Bookbinder, MD;  Location: [place];  Service: General;  Laterality: Left;   VENTRAL HERNIA REPAIR N/A 01/25/2020   Procedure: HERNIA REPAIR VENTRAL ADULT WITH MESH;  Surgeon: Armandina Gemma, MD;  [facility];  Service: General;  Laterality: N/A;    Family History  Problem Relation Age of Onset   Colon cancer Maternal Grandmother 68   Colon cancer Cousin 50   Colon cancer Maternal Grandfather 16   Cancer Other        breast  cancer in MGM's sister    Colon polyps Neg Hx    Rectal cancer Neg Hx    Stomach cancer Neg Hx    Esophageal cancer Neg Hx    Social History:  reports that she has been smoking cigarettes. She has a 12.50 pack-year smoking history. She has never used smokeless tobacco. She reports current alcohol use. She reports that she does not currently use drugs.  Allergies: No Known Allergies  Medications Prior to Admission  Medication Sig Dispense Refill   CALCIUM PO Take by mouth.     Cholecalciferol (VITAMIN D) 50 MCG (2000 UT) tablet Take 2,000 Units by mouth daily.     escitalopram (LEXAPRO) 20 MG tablet Take 20 mg by mouth daily.      ibuprofen (ADVIL) 200 MG tablet Take 600 mg by mouth every 6 (six) hours as needed for headache or mild pain.     tamoxifen (NOLVADEX) 20 MG tablet Take 1 tablet (20 mg total) by mouth daily. 90 tablet 1   Zoledronic Acid (ZOMETA IV) Inject into the vein. INFUSION EVERY 6 MONTHS FOR BONES AFTER CHEMO FOR BREAST CANCER      No results found for this or any previous visit (from the past 76 hour(s)). No results found.  Review of Systems  All other systems reviewed and are negative.  Blood pressure (!) 145/68, pulse  65, temperature 98.3 F (36.8 C), temperature source Oral, resp. rate 17, height _0  (1.753 m), weight 87.1 kg, last menstrual period 02/04/2019, SpO2 100 %. Physical Exam Constitutional:      Appearance: Normal appearance.  Cardiovascular:     Rate and Rhythm: Normal rate.  Pulmonary:     Effort: Pulmonary effort is normal.  Chest:     Comments: Left sided port in place Neurological:     Mental Status: She is alert.     Assessment/Plan Port removal under local mac per request  Rolm Bookbinder, MD 10/24/2020, 9:07 AM

## 2020-10-24 NOTE — Anesthesia Postprocedure Evaluation (Signed)
Anesthesia Post Note  Patient: Mistey Hoffert  Procedure(s) Performed: REMOVAL PORT-A-CATH (Left: Chest)     Patient location during evaluation: PACU Anesthesia Type: MAC Level of consciousness: awake and alert Pain management: pain level controlled Vital Signs Assessment: post-procedure vital signs reviewed and stable Respiratory status: spontaneous breathing, nonlabored ventilation, respiratory function stable and patient connected to nasal cannula oxygen Cardiovascular status: stable and blood pressure returned to baseline Postop Assessment: no apparent nausea or vomiting Anesthetic complications: no   No notable events documented.  Last Vitals:  Vitals:   10/24/20 1000 10/24/20 1020  BP: 106/69 (!) 144/72  Pulse: (!) 59 (!) 58  Resp: 15 18  Temp:  36.6 C  SpO2: 96% 97%    Last Pain:  Vitals:   10/24/20 1020  TempSrc: Oral  PainSc: 0-No pain                 Temperance Kelemen

## 2020-10-24 NOTE — Interval H&P Note (Signed)
History and Physical Interval Note:  10/24/2020 9:09 AM  Abigail Berg  has presented today for surgery, with the diagnosis of BREAST CANCER.  The various methods of treatment have been discussed with the patient and family. After consideration of risks, benefits and other options for treatment, the patient has consented to  Procedure(s): REMOVAL PORT-A-CATH (N/A) as a surgical intervention.  The patient's history has been reviewed, patient examined, no change in status, stable for surgery.  I have reviewed the patient's chart and labs.  Questions were answered to the patient's satisfaction.     Rolm Bookbinder

## 2020-10-24 NOTE — Transfer of Care (Signed)
Immediate Anesthesia Transfer of Care Note  Patient: Abigail Berg  Procedure(s) Performed: REMOVAL PORT-A-CATH (Left: Chest)  Patient Location: PACU  Anesthesia Type:MAC  Level of Consciousness: awake, alert  and oriented  Airway & Oxygen Therapy: Patient Spontanous Breathing and Patient connected to face mask oxygen  Post-op Assessment: Report given to RN and Post -op Vital signs reviewed and stable  Post vital signs: Reviewed and stable  Last Vitals:  Vitals Value Taken Time  BP    Temp    Pulse    Resp    SpO2      Last Pain:  Vitals:   10/24/20 0815  TempSrc: Oral  PainSc: 0-No pain      Patients Stated Pain Goal: 2 (32/12/24 8250)  Complications: No notable events documented.

## 2020-10-29 ENCOUNTER — Encounter (HOSPITAL_BASED_OUTPATIENT_CLINIC_OR_DEPARTMENT_OTHER): Payer: Self-pay | Admitting: General Surgery

## 2020-11-22 ENCOUNTER — Encounter: Payer: Self-pay | Admitting: Hematology

## 2020-11-22 ENCOUNTER — Other Ambulatory Visit: Payer: Self-pay | Admitting: Hematology

## 2020-11-22 ENCOUNTER — Inpatient Hospital Stay: Payer: BC Managed Care – PPO

## 2020-11-22 ENCOUNTER — Ambulatory Visit
Admission: RE | Admit: 2020-11-22 | Discharge: 2020-11-22 | Disposition: A | Discharge status: Home / Self Care | Payer: BC Managed Care – PPO | Source: Ambulatory Visit | Attending: Hematology | Admitting: Hematology

## 2020-11-22 ENCOUNTER — Inpatient Hospital Stay: Payer: BC Managed Care – PPO | Attending: Hematology

## 2020-11-22 ENCOUNTER — Inpatient Hospital Stay (HOSPITAL_BASED_OUTPATIENT_CLINIC_OR_DEPARTMENT_OTHER): Payer: BC Managed Care – PPO | Admitting: Hematology

## 2020-11-22 ENCOUNTER — Other Ambulatory Visit: Payer: Self-pay

## 2020-11-22 VITALS — BP 134/73 | HR 86 | Temp 98.7°F | Resp 18 | Ht 69.0 in | Wt 191.1 lb

## 2020-11-22 DIAGNOSIS — Z170 Estrogen receptor positive status [ER+]: Secondary | ICD-10-CM | POA: Diagnosis not present

## 2020-11-22 DIAGNOSIS — C50411 Malignant neoplasm of upper-outer quadrant of right female breast: Secondary | ICD-10-CM

## 2020-11-22 DIAGNOSIS — Z23 Encounter for immunization: Secondary | ICD-10-CM

## 2020-11-22 DIAGNOSIS — N951 Menopausal and female climacteric states: Secondary | ICD-10-CM | POA: Diagnosis not present

## 2020-11-22 DIAGNOSIS — Z79810 Long term (current) use of selective estrogen receptor modulators (SERMs): Secondary | ICD-10-CM | POA: Insufficient documentation

## 2020-11-22 DIAGNOSIS — Z853 Personal history of malignant neoplasm of breast: Secondary | ICD-10-CM | POA: Diagnosis not present

## 2020-11-22 DIAGNOSIS — Z1509 Genetic susceptibility to other malignant neoplasm: Secondary | ICD-10-CM

## 2020-11-22 DIAGNOSIS — R922 Inconclusive mammogram: Secondary | ICD-10-CM | POA: Diagnosis not present

## 2020-11-22 DIAGNOSIS — R918 Other nonspecific abnormal finding of lung field: Secondary | ICD-10-CM | POA: Diagnosis not present

## 2020-11-22 DIAGNOSIS — E210 Primary hyperparathyroidism: Secondary | ICD-10-CM | POA: Insufficient documentation

## 2020-11-22 DIAGNOSIS — M8580 Other specified disorders of bone density and structure, unspecified site: Secondary | ICD-10-CM | POA: Diagnosis not present

## 2020-11-22 LAB — CMP (CANCER CENTER ONLY)
ALT: 14 U/L (ref 0–44)
AST: 18 U/L (ref 15–41)
Albumin: 3.8 g/dL (ref 3.5–5.0)
Alkaline Phosphatase: 42 U/L (ref 38–126)
Anion gap: 9 (ref 5–15)
BUN: 12 mg/dL (ref 6–20)
CO2: 24 mmol/L (ref 22–32)
Calcium: 9.5 mg/dL (ref 8.9–10.3)
Chloride: 105 mmol/L (ref 98–111)
Creatinine: 1.04 mg/dL — ABNORMAL HIGH (ref 0.44–1.00)
GFR, Estimated: >60 mL/min (ref >60)
Glucose, Bld: 130 mg/dL — ABNORMAL HIGH (ref 70–99)
Potassium: 3.7 mmol/L (ref 3.5–5.1)
Sodium: 138 mmol/L (ref 135–145)
Total Bilirubin: 0.4 mg/dL (ref 0.3–1.2)
Total Protein: 7.1 g/dL (ref 6.5–8.1)

## 2020-11-22 LAB — CBC WITH DIFFERENTIAL (CANCER CENTER ONLY)
Abs Immature Granulocytes: 0.01 10*3/uL (ref 0.00–0.07)
Basophils Absolute: 0 10*3/uL (ref 0.0–0.1)
Basophils Relative: 1 %
Eosinophils Absolute: 0.1 10*3/uL (ref 0.0–0.5)
Eosinophils Relative: 3 %
HCT: 42.3 % (ref 36.0–46.0)
Hemoglobin: 14.7 g/dL (ref 12.0–15.0)
Immature Granulocytes: 0 %
Lymphocytes Relative: 37 %
Lymphs Abs: 2 10*3/uL (ref 0.7–4.0)
MCH: 31.3 pg (ref 26.0–34.0)
MCHC: 34.8 g/dL (ref 30.0–36.0)
MCV: 90.2 fL (ref 80.0–100.0)
Monocytes Absolute: 0.3 10*3/uL (ref 0.1–1.0)
Monocytes Relative: 6 %
Neutro Abs: 3 10*3/uL (ref 1.7–7.7)
Neutrophils Relative %: 53 %
Platelet Count: 245 10*3/uL (ref 150–400)
RBC: 4.69 MIL/uL (ref 3.87–5.11)
RDW: 13 % (ref 11.5–15.5)
WBC Count: 5.5 10*3/uL (ref 4.0–10.5)
nRBC: 0 % (ref 0.0–0.2)

## 2020-11-22 IMAGING — MG DIGITAL DIAGNOSTIC BILAT W/ TOMO W/ CAD
6 of 9 series · 6 of 25 positions shown · non-contrast
Comparison: Previous exams.

CLINICAL DATA: 55-year-old female with history of right breast
cancer post lumpectomy [DATE] post radiation, chemotherapy and
currently on tamoxifen. The patient is BRCA positive and is
alternating MRI with mammography.

EXAM:
DIGITAL DIAGNOSTIC BILATERAL MAMMOGRAM WITH TOMOSYNTHESIS AND CAD
TECHNIQUE: Bilateral digital diagnostic mammography and breast tomosynthesis
was performed. The images were evaluated with computer-aided
detection.

[R MLO]
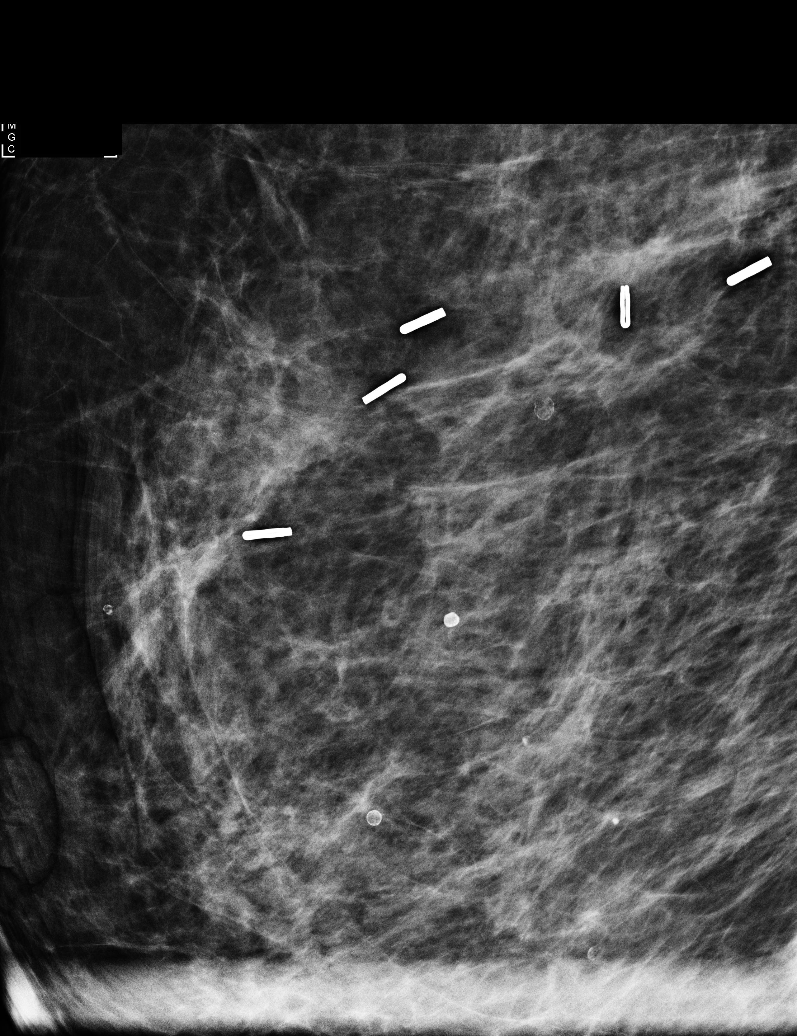

[L CC synth-2D]
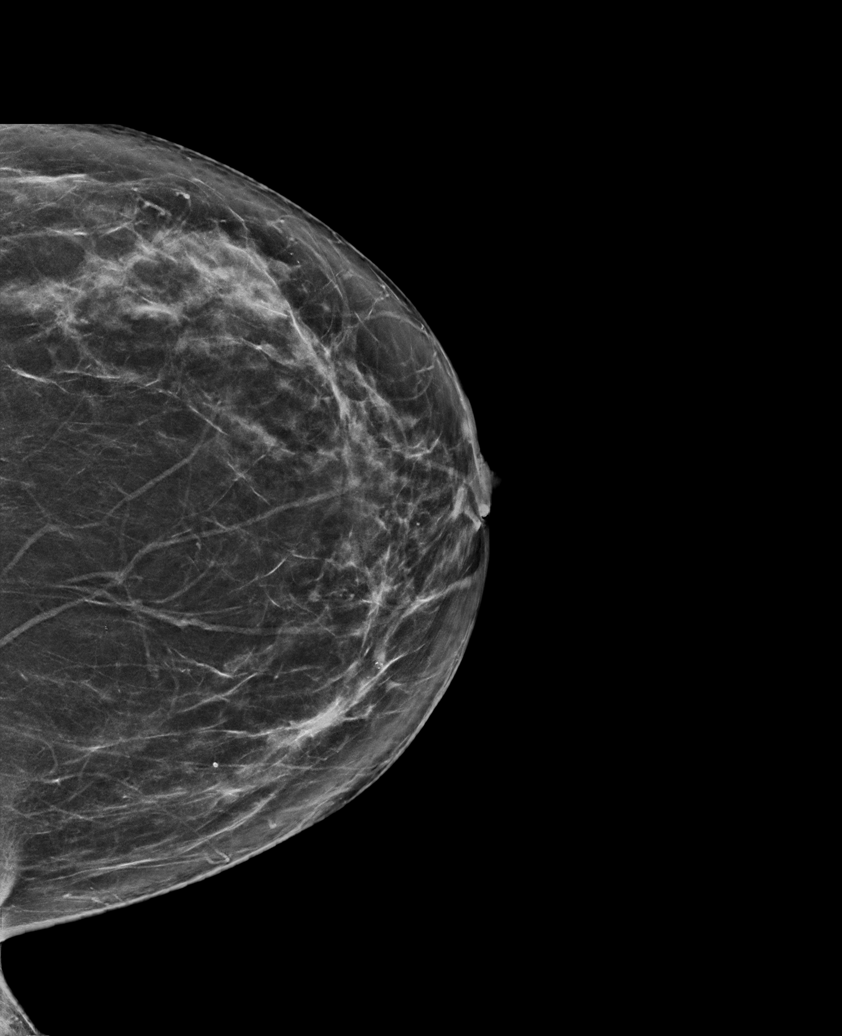

[R MLO synth-2D]
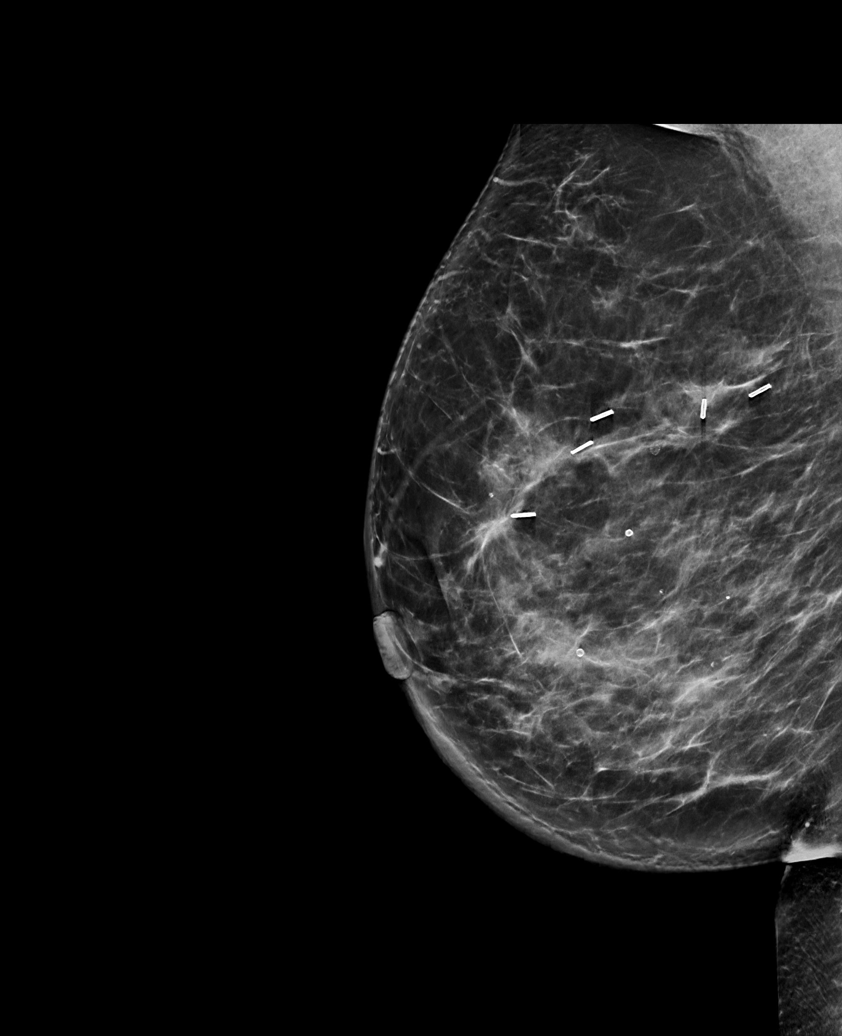

[R CC synth-2D]
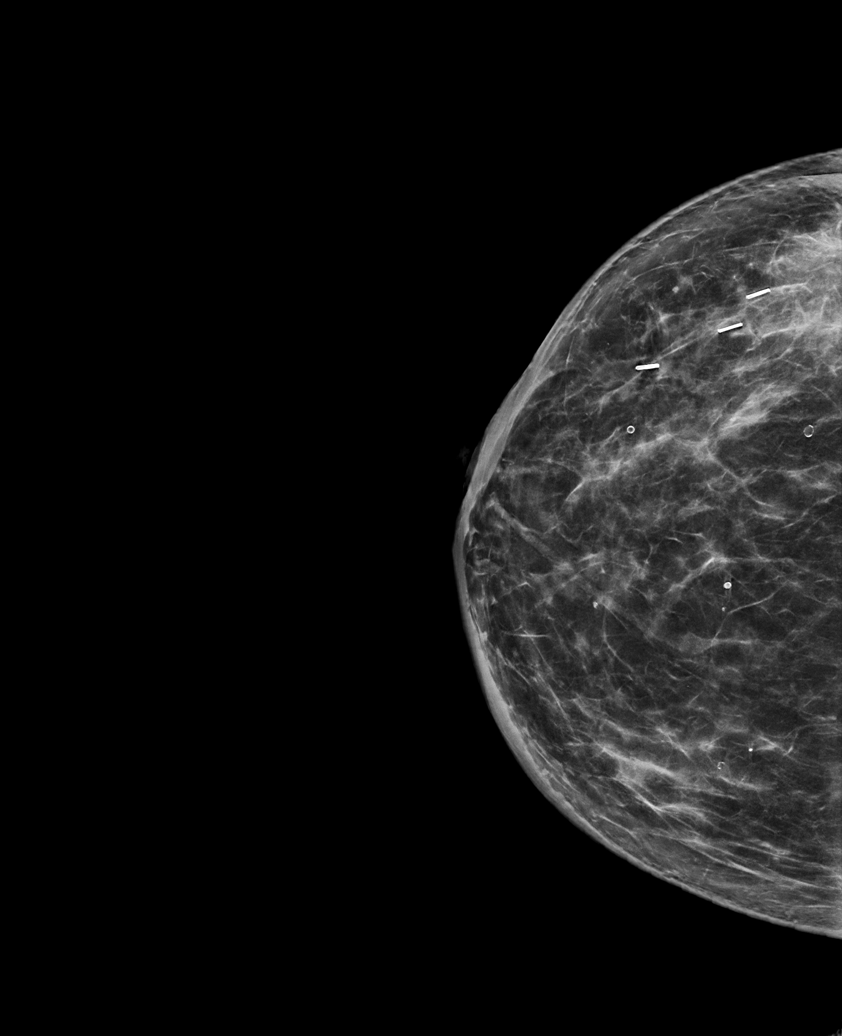

[L MLO synth-2D]
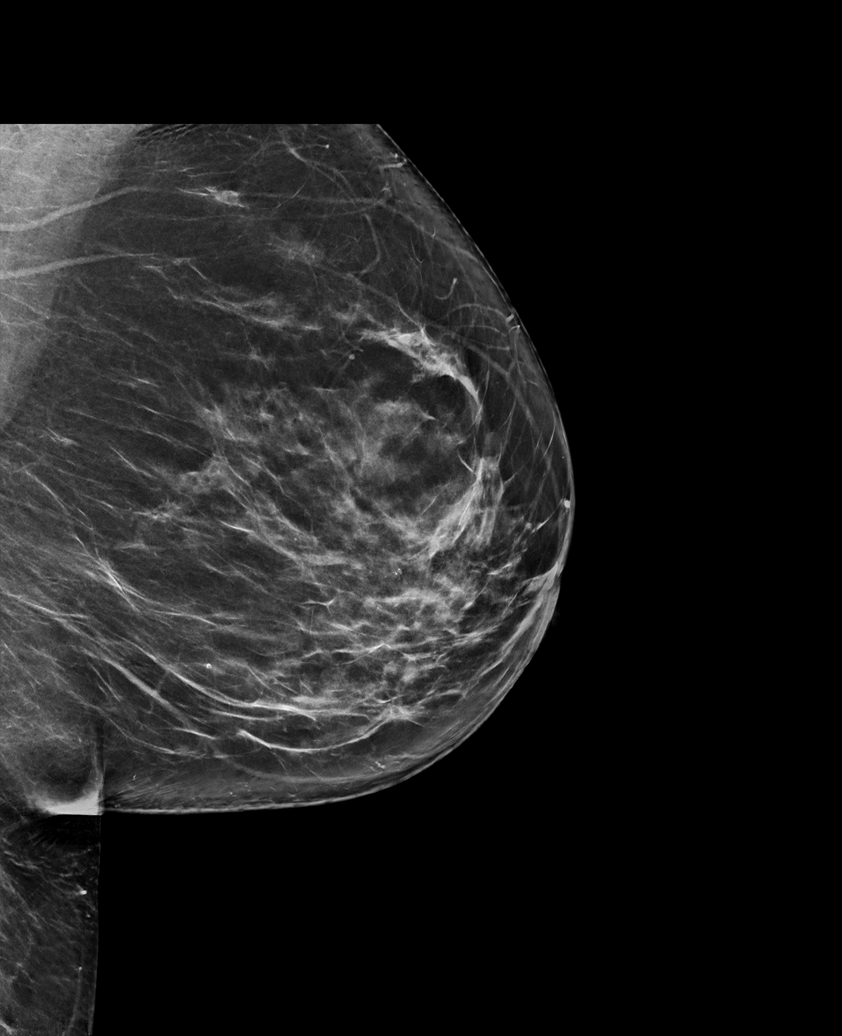

[L CC tomo · tomo slice 39/76.0]
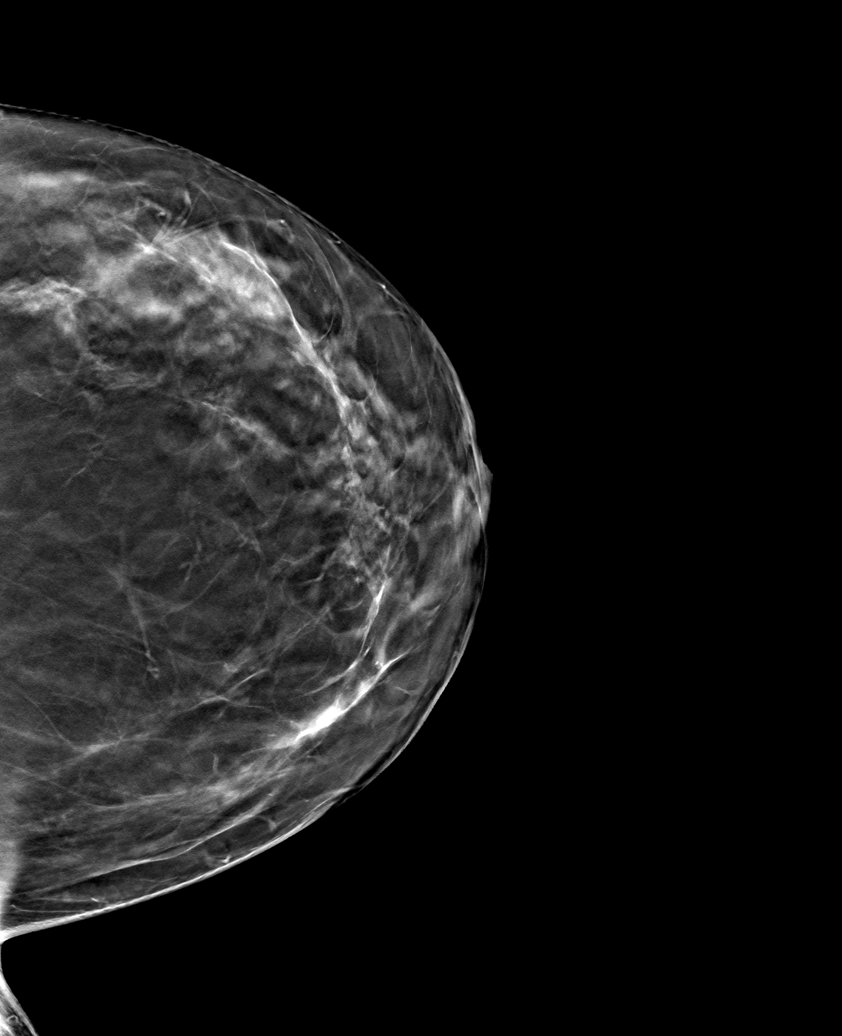

[6 of 25 positions shown; findings below may reference images not displayed]

ACR Breast Density Category c: The breast tissue is heterogeneously
dense, which may obscure small masses.
FINDINGS: No suspicious masses or calcifications seen in either breast.
Lumpectomy changes again identified within the upper-outer right
breast. Spot compression magnification MLO view of the right breast
lumpectomy site was performed. There is no mammographic evidence of
locally recurrent malignancy.
IMPRESSION: Stable lumpectomy changes in the right breast. No mammographic
evidence of malignancy.

RECOMMENDATION:
1.  Diagnostic mammogram is suggested in 1 year. (Code:[DQ])

2. Patient is reportedly BRCA positive with personal history of
breast cancer. The American Cancer Society recommends annual MRI and
mammography in patients with an estimated lifetime risk of
developing breast cancer greater than 20 - 25%, or who are known or
suspected to be positive for the breast cancer gene.

I have discussed the findings and recommendations with the patient.
If applicable, a reminder letter will be sent to the patient
regarding the next appointment.

BI-RADS CATEGORY  2: Benign.

## 2020-11-22 MED ORDER — GABAPENTIN 100 MG PO CAPS: 100.0000 mg | ORAL_CAPSULE | Freq: Two times a day (BID) | ORAL | 0 refills | Status: DC

## 2020-11-22 MED ORDER — INFLUENZA VAC SPLIT QUAD 0.5 ML IM SUSY
0.5000 mL | PREFILLED_SYRINGE | Freq: Once | INTRAMUSCULAR | Status: AC
  Administered 2020-11-22: 0.5 mL via INTRAMUSCULAR
  Filled 2020-11-22: qty 0.5

## 2020-11-22 MED ORDER — INFLUENZA VAC SPLIT QUAD 0.5 ML IM SUSY
0.5000 mL | PREFILLED_SYRINGE | Freq: Once | INTRAMUSCULAR | Status: DC
  Filled 2020-11-22: qty 0.5

## 2020-11-22 MED ORDER — TRAZODONE HCL 100 MG PO TABS: 100.0000 mg | ORAL_TABLET | Freq: Every day | ORAL | 0 refills | Status: DC

## 2020-11-22 NOTE — Patient Instructions (Signed)
Influenza (Flu) Vaccine (Inactivated or Recombinant): What You Need to Know 1. Why get vaccinated? Influenza vaccine can prevent influenza (flu). Flu is a contagious disease that spreads around the [place] every year, usually between October and May. Anyone can get the flu, but it is more dangerous for some people. Infants and young children, people 65 years and older, pregnant people, and people with certain health conditions or a weakened immune system are at greatest risk of flu complications. Pneumonia, bronchitis, sinus infections, and ear infections are examples of flu-related complications. If you have a medical condition, such as heart disease, cancer, or diabetes, flu can make it worse. Flu can cause fever and chills, sore throat, muscle aches, fatigue, cough, headache, and runny or stuffy nose. Some people may have vomiting and diarrhea, though this is more common in children than adults. In an average year, thousands of people in the [place] die from flu, and many more are hospitalized. Flu vaccine prevents millions of illnesses and flu-related visits to the doctor each year. 2. Influenza vaccines CDC recommends everyone 6 months and older get vaccinated every flu season. Children 6 months through 8 years of age may need 2 doses during a single flu season. Everyone else needs only 1 dose each flu season. It takes about 2 weeks for protection to develop after vaccination. There are many flu viruses, and they are always changing. Each year a new flu vaccine is made to protect against the influenza viruses believed to be likely to cause disease in the upcoming flu season. Even when the vaccine doesn't exactly match these viruses, it may still provide some protection. Influenza vaccine does not cause flu. Influenza vaccine may be given at the same time as other vaccines. 3. Talk with your health care provider Tell your vaccination provider if the person getting the vaccine: Has had  an allergic reaction after a previous dose of influenza vaccine, or has any severe, life-threatening allergies Has ever had Guillain-Barr Syndrome (also called "GBS") In some cases, your health care provider may decide to postpone influenza vaccination until a future visit. Influenza vaccine can be administered at any time during pregnancy. People who are or will be pregnant during influenza season should receive inactivated influenza vaccine. People with minor illnesses, such as a cold, may be vaccinated. People who are moderately or severely ill should usually wait until they recover before getting influenza vaccine. Your health care provider can give you more information. 4. Risks of a vaccine reaction Soreness, redness, and swelling where the shot is given, fever, muscle aches, and headache can happen after influenza vaccination. There may be a very small increased risk of Guillain-Barr Syndrome (GBS) after inactivated influenza vaccine (the flu shot). Young children who get the flu shot along with pneumococcal vaccine (PCV13) and/or DTaP vaccine at the same time might be slightly more likely to have a seizure caused by fever. Tell your health care provider if a child who is getting flu vaccine has ever had a seizure. People sometimes faint after medical procedures, including vaccination. Tell your provider if you feel dizzy or have vision changes or ringing in the ears. As with any medicine, there is a very remote chance of a vaccine causing a severe allergic reaction, other serious injury, or death. 5. What if there is a serious problem? An allergic reaction could occur after the vaccinated person leaves the clinic. If you see signs of a severe allergic reaction (hives, swelling of the face and throat, difficulty breathing,   a fast heartbeat, dizziness, or weakness), call 9-1-1 and get the person to the nearest hospital. For other signs that concern you, call your health care provider. Adverse  reactions should be reported to the Vaccine Adverse Event Reporting System (VAERS). Your health care provider will usually file this report, or you can do it yourself. Visit the VAERS website at www.vaers.hhs.gov or call 1-800-822-7967. VAERS is only for reporting reactions, and VAERS staff members do not give medical advice. 6. The National Vaccine Injury Compensation Program The National Vaccine Injury Compensation Program (VICP) is a federal program that was created to compensate people who may have been injured by certain vaccines. Claims regarding alleged injury or death due to vaccination have a time limit for filing, which may be as short as two years. Visit the VICP website at www.hrsa.gov/vaccinecompensation or call 1-800-338-2382 to learn about the program and about filing a claim. 7. How can I learn more? Ask your health care provider. Call your local or state health department. Visit the website of the Food and Drug Administration (FDA) for vaccine package inserts and additional information at www.fda.gov/vaccines-blood-biologics/vaccines. Contact the Centers for Disease Control and Prevention (CDC): Call 1-800-232-4636 (1-800-CDC-INFO) or Visit CDC's website at www.cdc.gov/flu. Vaccine Information Statement Inactivated Influenza Vaccine (09/09/2019) This information is not intended to replace advice given to you by your health care provider. Make sure you discuss any questions you have with your health care provider. Document Revised: 10/27/2019 Document Reviewed: 10/27/2019 Elsevier Patient Education  2022 Elsevier Inc.  

## 2020-11-22 NOTE — Progress Notes (Signed)
[place]   Telephone:(336) [phone] Fax:(336) [phone]   Clinic Follow up Note   Patient Care Team: Patient, [name] (Inactive) as PCP - General ([place]) Minus Breeding, MD as PCP - Cardiology (Cardiology) Armbruster, Carlota Raspberry, MD as Consulting Physician (Gastroenterology) Eyvonne Mechanic as Counselor (Licensed Clinical Social Worker) Truitt Merle, MD as Consulting Physician (Hematology) Alla Feeling, NP as Nurse Practitioner (Nurse Practitioner) Mauro Kaufmann, RN as Oncology Nurse Navigator Rockwell Germany, RN as Oncology Nurse Navigator Rolm Bookbinder, MD as Consulting Physician (General Surgery)  Date of Service:  11/22/2020  CHIEF COMPLAINT: f/u of right breast cancer  CURRENT THERAPY:  Tamoxifen, started 11/2019  ASSESSMENT & PLAN:  Abigail Berg is a 54 y.o. female with   1. Malignant neoplasm of upper-outer quadrant of right breast, Stage 1A, p(T1cN0M0), stage IA, ER+/PR-/HER2+, Grade 3.  -She was diagnosed in 05/2019. She is s/p right breast lumpectomy and adjuvant radiation -Given her high risk HER2 positive breast cancer, she completed chemotherapy with weekly Abraxane and trastuzumab 07/29/19-10/12/19. She started maintenance  trastuzumab (Kanjinti) on 11/03/19, given every 3 weeks through 07/12/20. -She started adjuvant tamoxifen in 11/2019, tolerating well overall with mild hot flashes. -She had a palpable mass in her outer right breast. MRI breast from 05/10/20 shows right breast changes to be radiation related.  -Continue Tamoxifen. We discussed possibly switching to anastrozole after she became postmenopausal. Her last period was in 02/2019, prior to her cancer diagnosis. We drew [facility] today, results pending. -She will proceed to mammogram after our visit today. -she is clinically doing well. Physical exam and lab work were unremarkable.  2. Symptom management: insomnia, hot flashes -insomnia could be stress-related and/or related to hot  flashes. -I offered gabapentin, which she previously took for leg pain. She will try. -She is on lexapro for depression. I offered to refer her to a psychologist, but she declines at this time, noting she has good support from her sister. -I will prescribe trazodone for her insomnia   3. Osteopenia  -Most recent bone density from 12/08/19 was -1.7. -She began Zometa on 08/30/20. -She knows to continue with regular dentist checkups   4. Lung nodules -Her CT chest from 05/24/20 showed Bandlike lung opacity in the anterior right upper lobe is presumably secondary to radiation therapy.  -Repeat on 08/23/20 showed improvement. Will plan on repeat in a year.   5. 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  -She is s/p ileocecectomy 08/14/15 for benign colon polyp invading appendix. She also has history of recurrent colon polyps. She will f/u with Dr. Havery Moros for screening colonoscopies, last done 10/11/20 was benign. Next due in 3-5 years -She was previously on chemoprevention with Tamoxifen 10/2018-06/17/19, restarted 11/2019 following breast cancer treatment   6. H/o right breast fibroadenoma, S/p right surgical resection on 11/22/15    7. Primary hyperparathyroidism -She reported right hip/thigh pain. I prescribed gabapentin 11/29/19. -She underwent left inferior parathyroidectomy with Dr Harlow Asa on 01/25/20. Path consistent with adenoma -Her pain resolved after surgery, and she no longer needs the gabapentin.     PLAN:  -proceed to mammogram today  -continue tamoxifen -I prescribed gabapentin for her to try for hot flashes and trazadone for sleep -labs and f/u in 3 months   No problem-specific Assessment & Plan notes found for this encounter.  SUMMARY OF ONCOLOGIC HISTORY: Oncology History Overview Note  Cancer  Staging Malignant neoplasm of upper-outer quadrant of right breast in female, estrogen receptor positive ([place]) Staging form: Breast, AJCC 8th Edition - Clinical stage from 06/01/2019: Stage IA (cT1c, cN0, cM0, G3, ER+, PR-, HER2+) - Signed by Truitt Merle, MD on 06/17/2019 - Pathologic stage from 06/30/2019: Stage IA (pT1c, pN0, cM0, G3, ER+, PR-, HER2+) - Signed by Truitt Merle, MD on 07/15/2019    Malignant neoplasm of upper-outer quadrant of right breast in female, estrogen receptor positive ([place])  10/2018 - 06/17/2019 Anti-estrogen oral therapy   She was preventively started on Tamoxifen since 10/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 Held after 06/17/19 to proceed with her breast cancer surgery and chemo.    05/06/2019 Breast MRI   IMPRESSION Enhancing right breast mass suspicious for malignancy. Second look [place] if recommended.  No Suspicious enhancing left breast masses.    05/24/2019 Breast US   IMPRESSION 1.8cm irregular right breast mass within the 9:30 position 3.5cm from the nipple is highly suggestive of malignancy. An Ultrasound guided breast biopsy is recommended. The findings of the study and recommendation for biopsy were discussed with the patient directly.    06/01/2019 Cancer Staging   Staging form: Breast, AJCC 8th Edition - Clinical stage from 06/01/2019: Stage IA (cT1c, cN0, cM0, G3, ER+, PR-, HER2+) - Signed by Truitt Merle, MD on 06/17/2019   06/01/2019 Initial Biopsy   Breast Biopsy - Right Breast Core Bx DIAGNOSIS INFILTRATING DUCT CARCINOMA WITH MINOR DUCTAL CARCINOMA IN SITU COMPNENT OF RIGHT BREAST, CORE NEEDLE BIOPSY    06/01/2019 Receptors her2   ER: greater than 75% of tumor cells show moderate staining  PR: Negative  HER2: Positive 3+ Ki67: 40%   06/17/2019 Initial Diagnosis   Malignant neoplasm of upper-outer quadrant of right breast in female, estrogen receptor positive ([place])   06/30/2019 Surgery   RIGHT BREAST LUMPECTOMY WITH RADIOACTIVE SEED AND RIGHT AXILLARY SENTINEL NODE  BIOPSY and PAC placement  by Dr Donne Hazel     06/30/2019 Pathology Results   FINAL MICROSCOPIC DIAGNOSIS:   A. BREAST, RIGHT, LUMPECTOMY:  - Invasive ductal carcinoma, grade 3, spanning 1.5 cm.  - Intermediate grade ductal carcinoma in situ.  - Invasive carcinoma is 0.1-0.2 from the final lateral margin (part E).  - Margins are negative for in situ carcinoma.  - Biopsy site.  - See oncology table.   B. LYMPH NODE, RIGHT AXILLARY, SENTINEL, BIOPSY:  - One of one lymph nodes negative for carcinoma (0/1).   C. LYMPH NODE, RIGHT AXILLARY, SENTINEL, BIOPSY:  - One of one lymph nodes negative for carcinoma (0/1).   D. LYMPH NODE, RIGHT AXILLARY, SENTINEL, BIOPSY:  - One of one lymph nodes negative for carcinoma (0/1).   E. BREAST, RIGHT ADDITIONAL LATERAL MARGIN, EXCISION:  - Invasive ductal carcinoma.  - Invasive carcinoma is 0.1-0.2 cm from the new lateral margin.   F. BREAST, RIGHT ADDITIONAL POSTERIOR MARGIN, EXCISION:  - Fibrocystic change and usual ductal hyperplasia.  - No malignancy identified.   G. BREAST, RIGHT ADDITIONAL SUPERIOR MARGIN, EXCISION:  - Fibrocystic change and usual ductal hyperplasia.  - Incidental radial scar.  - No malignancy identified.     PROGNOSTIC INDICATOR RESULTS:  The tumor cells are POSITIVE for Her2 (3+).  Estrogen Receptor:       POSITIVE, 85%, MODERATE STAINING  Progesterone Receptor:   NEGATIVE  Proliferation Marker Ki-67:   25%    06/30/2019 Cancer Staging   Staging form: Breast,  AJCC 8th Edition - Pathologic stage from 06/30/2019: Stage IA (pT1c, pN0, cM0, G3, ER+, PR-, HER2+) - Signed by Truitt Merle, MD on 07/15/2019   07/29/2019 - 07/12/2020 Chemotherapy   Adjuvant Weekly Abraxane (Due to her insurance denial of Abraxane, I changed it to paclitaxel starting 09/15/19) and Transtuzumab for 12 weeks starting 07/29/19-10/12/19, followed by maintenance trastuzumab q3weeks starting 11/03/19 to complete 1 year of treatment. Due to headaches her  trastuzumab (Ogivr) was switched to trastuzumab Calla Kicks) on 09/08/19.     11/24/2019 -  Anti-estrogen oral therapy   Tamoxifen   12/2019 - 01/2020 Radiation Therapy   Outside Radiation in late 2021.       INTERVAL HISTORY:  Abigail Berg is here for a follow up of breast cancer. She was last seen by me on 08/30/20. She presents to the clinic accompanied by sister(?). She reports she is unable to sleep, but she does not feel tired. She states she does not sleep at night or during the day. She has some social stressors, such as her husband recently having heart surgery, her daughter having a complicated pregnancy, taking care of her mother and her mother-in-law. She reports she is having severe hot flashes.   All other systems were reviewed with the patient and are negative.  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   Personal history of colonic polyps    Pneumonia    S/P partial colectomy 2017    SURGICAL HISTORY: Past Surgical History:  Procedure Laterality Date   BREAST LUMPECTOMY Right 06/30/2019   right breast lumpectomy IDC w/ rad w/chemo   BREAST LUMPECTOMY WITH RADIOACTIVE SEED AND SENTINEL LYMPH NODE BIOPSY Right 06/30/2019   Procedure: RIGHT BREAST LUMPECTOMY WITH RADIOACTIVE SEED AND RIGHT AXILLARY SENTINEL NODE BIOPSY;  Surgeon: Rolm Bookbinder, MD;  Location: [place];  Service: General;  Laterality: Right;  PEC BLOCK   BREAST SURGERY     COLON SURGERY  2017   Partial colectomy    COLONOSCOPY     PARATHYROIDECTOMY Left 01/25/2020   Procedure: LEFT INFERIOR PARATHYROIDECTOMY;  Surgeon: Armandina Gemma, MD;  Location: [facility];  Service: General;  Laterality: Left;   POLYPECTOMY     PORT-A-CATH REMOVAL Left 10/24/2020   Procedure: REMOVAL PORT-A-CATH;  Surgeon: Rolm Bookbinder, MD;  Location: [place];  Service: General;  Laterality: Left;   PORTACATH PLACEMENT Left 06/30/2019   Procedure: INSERTION PORT-A-CATH WITH ULTRASOUND GUIDANCE;  Surgeon: Rolm Bookbinder, MD;  Location: [place];  Service: General;  Laterality: Left;   VENTRAL HERNIA REPAIR N/A 01/25/2020   Procedure: HERNIA REPAIR VENTRAL ADULT WITH MESH;  Surgeon: Armandina Gemma, MD;  Location: [facility];  Service: General;  Laterality: N/A;    I have reviewed the social history and family history with the patient and they are unchanged from previous note.  ALLERGIES:  has No Known Allergies.  MEDICATIONS:  Current Outpatient Medications  Medication Sig  Dispense Refill   gabapentin (NEURONTIN) 100 MG capsule Take 1 capsule (100 mg total) by mouth 2 (two) times daily. OK to increase to 230m at night on second week and 3082mat night on third week if needed 60 capsule 0   traZODone (DESYREL) 100 MG tablet Take 1 tablet (100 mg total) by mouth at bedtime. 30 tablet 0   CALCIUM PO Take by mouth.     Cholecalciferol (VITAMIN D) 50 MCG (2000 UT) tablet Take 2,000 Units by mouth daily.     escitalopram (LEXAPRO) 20 MG tablet Take 20 mg by mouth daily.      ibuprofen (ADVIL) 200 MG tablet Take 600 mg by mouth every 6 (six) hours as needed for headache or mild pain.     tamoxifen (NOLVADEX) 20 MG tablet Take 1 tablet (20 mg total) by mouth daily. 90 tablet 1   Zoledronic Acid (ZOMETA IV) Inject into the vein. INFUSION EVERY 6 MONTHS FOR BONES AFTER CHEMO FOR BREAST CANCER     No current facility-administered medications for this visit.    PHYSICAL EXAMINATION: ECOG PERFORMANCE  STATUS: 0 - Asymptomatic  Vitals:   11/22/20 1332  BP: 134/73  Pulse: 86  Resp: 18  Temp: 98.7 F (37.1 C)  SpO2: 100%   Wt Readings from Last 3 Encounters:  11/22/20 191 lb 1.6 oz (86.7 kg)  10/24/20 192 lb 0.3 oz (87.1 kg)  10/11/20 180 lb (81.6 kg)     GENERAL:alert, no distress and comfortable SKIN: skin color, texture, turgor are normal, no rashes or significant lesions EYES: normal, Conjunctiva are pink and non-injected, sclera clear  Musculoskeletal:no cyanosis of digits and no clubbing  NEURO: alert & oriented x 3 with fluent speech, no focal motor/sensory deficits BREAST: No palpable mass, nodules or adenopathy bilaterally. Breast exam benign.   LABORATORY DATA:  I have reviewed the data as listed CBC Latest Ref Rng & Units 11/22/2020 08/30/2020 08/23/2020  WBC 4.0 - 10.5 K/uL 5.5 6.6 5.5  Hemoglobin 12.0 - 15.0 g/dL 14.7 14.1 14.3  Hematocrit 36.0 - 46.0 % 42.3 40.7 40.6  Platelets 150 - 400 K/uL 245 216 222     CMP Latest Ref Rng & Units 11/22/2020 08/30/2020 08/23/2020  Glucose 70 - 99 mg/dL 130(H) 121(H) 133(H)  BUN 6 - 20 mg/dL _0 Creatinine 0.44 - 1.00 mg/dL 1.04(H) 1.12(H) 0.99  Sodium 135 - 145 mmol/L 138 139 138  Potassium 3.5 - 5.1 mmol/L 3.7 4.1 3.6  Chloride 98 - 111 mmol/L 105 106 105  CO2 22 - 32 mmol/L _1 Calcium 8.9 - 10.3 mg/dL 9.5 9.3 9.2  Total Protein 6.5 - 8.1 g/dL 7.1 6.8 6.9  Total Bilirubin 0.3 - 1.2 mg/dL 0.4 0.3 0.4  Alkaline Phos 38 - 126 U/L 42 63 59  AST 15 - 41 U/L _2 ALT 0 - 44 U/L _3 RADIOGRAPHIC STUDIES: I have personally reviewed the radiological images as listed and agreed with the findings in the report. MM DIAG BREAST TOMO BILATERAL  Result Date: 11/22/2020 CLINICAL DATA:  [age]ear old female with history of right breast cancer post lumpectomy 06/30/2019 post radiation, chemotherapy and currently on tamoxifen. The patient is BRCA positive and is alternating MRI with mammography. EXAM:  DIGITAL DIAGNOSTIC BILATERAL MAMMOGRAM WITH TOMOSYNTHESIS AND CAD TECHNIQUE: Bilateral digital diagnostic mammography and breast tomosynthesis was performed. The images were evaluated with computer-aided detection. COMPARISON:  Previous exams. ACR Breast Density  Category c: The breast tissue is heterogeneously dense, which may obscure small masses. FINDINGS: No suspicious masses or calcifications seen in either breast. Lumpectomy changes again identified within the upper-outer right breast. Spot compression magnification MLO view of the right breast lumpectomy site was performed. There is no mammographic evidence of locally recurrent malignancy. IMPRESSION: Stable lumpectomy changes in the right breast. No mammographic evidence of malignancy. RECOMMENDATION: 1.  Diagnostic mammogram is suggested in 1 year. (Code:[id number]-B-01Y) 2. Patient is reportedly BRCA positive with personal history of breast cancer. The American Cancer Society recommends annual MRI and mammography in patients with an estimated lifetime risk of developing breast cancer greater than 20 - 25%, or who are known or suspected to be positive for the breast cancer gene. I have discussed the findings and recommendations with the patient. If applicable, a reminder letter will be sent to the patient regarding the next appointment. BI-RADS CATEGORY  2: Benign. Electronically Signed   By: Everlean Alstrom M.D.   On: 11/22/2020 15:13      No orders of the defined types were placed in this encounter.  All questions were answered. The patient knows to call the clinic with any problems, questions or concerns. No barriers to learning was detected. The total time spent in the appointment was 30 minutes.     Truitt Merle, MD 11/22/2020   I, Wilburn Mylar, am acting as scribe for Truitt Merle, MD.   I have reviewed the above documentation for accuracy and completeness, and I agree with the above.

## 2020-11-23 ENCOUNTER — Telehealth: Payer: Self-pay | Admitting: Hematology

## 2020-11-23 LAB — FOLLICLE STIMULATING HORMONE: FSH: 19.7 m[IU]/mL

## 2020-11-23 NOTE — Telephone Encounter (Signed)
Scheduled following-up appointment per 10/20 los. Patient is aware.

## 2020-11-28 ENCOUNTER — Telehealth: Payer: Self-pay

## 2020-11-28 NOTE — Telephone Encounter (Signed)
Called pt stating based on lab results per Dr. Burr Medico, the pt is still pre-menopausal.  Pt had no additional questions.

## 2020-12-20 ENCOUNTER — Other Ambulatory Visit: Payer: Self-pay | Admitting: Hematology

## 2020-12-20 MED ORDER — GABAPENTIN 100 MG PO CAPS: 100.0000 mg | ORAL_CAPSULE | Freq: Two times a day (BID) | ORAL | 0 refills | Status: DC

## 2020-12-23 ENCOUNTER — Other Ambulatory Visit: Payer: Self-pay | Admitting: Hematology

## 2020-12-23 MED ORDER — GABAPENTIN 100 MG PO CAPS
100.0000 mg | ORAL_CAPSULE | Freq: Two times a day (BID) | ORAL | 0 refills | Status: DC
Start: 2020-12-23 — End: 2020-12-24

## 2020-12-24 ENCOUNTER — Other Ambulatory Visit: Payer: Self-pay

## 2020-12-24 ENCOUNTER — Encounter: Payer: Self-pay | Admitting: Hematology

## 2020-12-24 DIAGNOSIS — C50411 Malignant neoplasm of upper-outer quadrant of right female breast: Secondary | ICD-10-CM

## 2020-12-24 DIAGNOSIS — Z170 Estrogen receptor positive status [ER+]: Secondary | ICD-10-CM

## 2020-12-24 MED ORDER — GABAPENTIN 100 MG PO CAPS: 100.0000 mg | ORAL_CAPSULE | Freq: Two times a day (BID) | ORAL | 0 refills | Status: DC

## 2020-12-24 NOTE — Telephone Encounter (Signed)
Gabapentin not preferred with insurance.  Forwarded to provider for recommendations.

## 2020-12-25 ENCOUNTER — Encounter: Payer: Self-pay | Admitting: Hematology

## 2021-01-13 ENCOUNTER — Other Ambulatory Visit: Payer: Self-pay | Admitting: Hematology

## 2021-01-13 DIAGNOSIS — C50411 Malignant neoplasm of upper-outer quadrant of right female breast: Secondary | ICD-10-CM

## 2021-01-13 DIAGNOSIS — R059 Cough, unspecified: Secondary | ICD-10-CM | POA: Diagnosis not present

## 2021-01-13 DIAGNOSIS — J09X2 Influenza due to identified novel influenza A virus with other respiratory manifestations: Secondary | ICD-10-CM | POA: Diagnosis not present

## 2021-01-14 ENCOUNTER — Encounter: Payer: Self-pay | Admitting: Hematology

## 2021-01-20 ENCOUNTER — Other Ambulatory Visit: Payer: Self-pay | Admitting: Hematology

## 2021-02-17 ENCOUNTER — Other Ambulatory Visit: Payer: Self-pay | Admitting: Hematology

## 2021-02-28 ENCOUNTER — Other Ambulatory Visit: Payer: Self-pay

## 2021-02-28 ENCOUNTER — Inpatient Hospital Stay: Payer: BC Managed Care – PPO | Admitting: Hematology

## 2021-02-28 ENCOUNTER — Inpatient Hospital Stay: Payer: BC Managed Care – PPO | Attending: Hematology

## 2021-02-28 ENCOUNTER — Inpatient Hospital Stay: Payer: BC Managed Care – PPO

## 2021-02-28 VITALS — BP 123/70 | HR 77 | Temp 98.3°F | Resp 18 | Ht 69.0 in | Wt 183.6 lb

## 2021-02-28 DIAGNOSIS — C50411 Malignant neoplasm of upper-outer quadrant of right female breast: Secondary | ICD-10-CM

## 2021-02-28 DIAGNOSIS — M8580 Other specified disorders of bone density and structure, unspecified site: Secondary | ICD-10-CM | POA: Diagnosis not present

## 2021-02-28 DIAGNOSIS — Z1589 Genetic susceptibility to other disease: Secondary | ICD-10-CM | POA: Diagnosis not present

## 2021-02-28 DIAGNOSIS — Z1509 Genetic susceptibility to other malignant neoplasm: Secondary | ICD-10-CM

## 2021-02-28 DIAGNOSIS — Z95828 Presence of other vascular implants and grafts: Secondary | ICD-10-CM

## 2021-02-28 DIAGNOSIS — R918 Other nonspecific abnormal finding of lung field: Secondary | ICD-10-CM | POA: Diagnosis not present

## 2021-02-28 DIAGNOSIS — Z170 Estrogen receptor positive status [ER+]: Secondary | ICD-10-CM | POA: Insufficient documentation

## 2021-02-28 DIAGNOSIS — Z1501 Genetic susceptibility to malignant neoplasm of breast: Secondary | ICD-10-CM | POA: Diagnosis not present

## 2021-02-28 DIAGNOSIS — Z79810 Long term (current) use of selective estrogen receptor modulators (SERMs): Secondary | ICD-10-CM | POA: Diagnosis not present

## 2021-02-28 DIAGNOSIS — Z1502 Genetic susceptibility to malignant neoplasm of ovary: Secondary | ICD-10-CM

## 2021-02-28 LAB — CBC WITH DIFFERENTIAL (CANCER CENTER ONLY)
Abs Immature Granulocytes: 0.01 10*3/uL (ref 0.00–0.07)
Basophils Absolute: 0 10*3/uL (ref 0.0–0.1)
Basophils Relative: 1 %
Eosinophils Absolute: 0.1 10*3/uL (ref 0.0–0.5)
Eosinophils Relative: 2 %
HCT: 42.5 % (ref 36.0–46.0)
Hemoglobin: 14.7 g/dL (ref 12.0–15.0)
Immature Granulocytes: 0 %
Lymphocytes Relative: 38 %
Lymphs Abs: 2.4 10*3/uL (ref 0.7–4.0)
MCH: 31.3 pg (ref 26.0–34.0)
MCHC: 34.6 g/dL (ref 30.0–36.0)
MCV: 90.6 fL (ref 80.0–100.0)
Monocytes Absolute: 0.4 10*3/uL (ref 0.1–1.0)
Monocytes Relative: 6 %
Neutro Abs: 3.3 10*3/uL (ref 1.7–7.7)
Neutrophils Relative %: 53 %
Platelet Count: 224 10*3/uL (ref 150–400)
RBC: 4.69 MIL/uL (ref 3.87–5.11)
RDW: 13.6 % (ref 11.5–15.5)
WBC Count: 6.2 10*3/uL (ref 4.0–10.5)
nRBC: 0 % (ref 0.0–0.2)

## 2021-02-28 LAB — CMP (CANCER CENTER ONLY)
ALT: 10 U/L (ref 0–44)
AST: 15 U/L (ref 15–41)
Albumin: 4.1 g/dL (ref 3.5–5.0)
Alkaline Phosphatase: 39 U/L (ref 38–126)
Anion gap: 7 (ref 5–15)
BUN: 13 mg/dL (ref 6–20)
CO2: 26 mmol/L (ref 22–32)
Calcium: 9.2 mg/dL (ref 8.9–10.3)
Chloride: 106 mmol/L (ref 98–111)
Creatinine: 0.96 mg/dL (ref 0.44–1.00)
GFR, Estimated: >60 mL/min (ref >60)
Glucose, Bld: 91 mg/dL (ref 70–99)
Potassium: 3.6 mmol/L (ref 3.5–5.1)
Sodium: 139 mmol/L (ref 135–145)
Total Bilirubin: 0.4 mg/dL (ref 0.3–1.2)
Total Protein: 7 g/dL (ref 6.5–8.1)

## 2021-02-28 MED ORDER — TAMOXIFEN CITRATE 20 MG PO TABS: 20.0000 mg | ORAL_TABLET | Freq: Every day | ORAL | 1 refills | Status: DC

## 2021-02-28 MED ORDER — TRAZODONE HCL 100 MG PO TABS: 100.0000 mg | ORAL_TABLET | Freq: Every evening | ORAL | 3 refills | Status: DC | PRN

## 2021-02-28 MED ORDER — ZOLEDRONIC ACID 4 MG/100ML IV SOLN
4.0000 mg | Freq: Once | INTRAVENOUS | Status: AC
  Administered 2021-02-28: 4 mg via INTRAVENOUS
  Filled 2021-02-28: qty 100

## 2021-02-28 MED ORDER — SODIUM CHLORIDE 0.9 % IV SOLN: Freq: Once | INTRAVENOUS | Status: AC

## 2021-02-28 MED ORDER — ESCITALOPRAM OXALATE 20 MG PO TABS: 20.0000 mg | ORAL_TABLET | Freq: Every day | ORAL | 5 refills | Status: DC

## 2021-02-28 NOTE — Progress Notes (Signed)
[place]   [phone] Fax:([phone]) [phone]   Clinic Follow up Note   Patient Care Team: Patient, [name] (Inactive) as PCP - General ([place]) Minus Breeding, MD as PCP - Cardiology (Cardiology) Armbruster, Carlota Raspberry, MD as Consulting Physician (Gastroenterology) Eyvonne Mechanic as Counselor (Licensed Clinical Social Worker) Truitt Merle, MD as Consulting Physician (Hematology) Alla Feeling, NP as Nurse Practitioner (Nurse Practitioner) Mauro Kaufmann, RN as Oncology Nurse Navigator Rockwell Germany, RN as Oncology Nurse Navigator Rolm Bookbinder, MD as Consulting Physician (General Surgery)  Date of Service:  02/28/2021  CHIEF COMPLAINT: f/u of right breast cancer  CURRENT THERAPY:  Tamoxifen, started 11/2019  ASSESSMENT & PLAN:  [place] Devine is a 56 y.o. female with   1. Malignant neoplasm of upper-outer quadrant of right breast, Stage 1A, p(T1cN0M0), stage IA, ER+/PR-/HER2+, Grade 3.  -She was diagnosed in 05/2019. She is s/p right breast lumpectomy and adjuvant radiation. She completed weekly Abraxane and trastuzumab 07/29/19-10/12/19 and maintenance trastuzumab (Kanjinti) q3weeks 11/03/19-07/12/20. -She started adjuvant tamoxifen in 11/2019, tolerating well overall with mild hot flashes. Her last period was 02/2019, prior to chemo. Last [facility] 11/22/20 showed she is still perimenopausal. We will check again at her next visit. -most recent screening MRI on 05/10/20 and mammogram on 11/22/20 were benign. -she is clinically doing well. Physical exam and labs today were unremarkable. -Continue breast cancer surveillance   2. Symptom management: insomnia, hot flashes, cramps -insomnia could be stress-related and/or related to hot flashes. -she is now on gabapentin, which is very helpful for her. -She is on lexapro for depression and now trazodone for her insomnia -she reports cramps at night. I advised her to increase her calcium and drink more  water.   3. Osteopenia  -Most recent bone density from 12/08/19 was -1.7. -She began Zometa on 08/30/20 and experienced bone pain. I advised her to take tylenol afterwards today. -I advised her to continue calcium, especially on Zometa. -She knows to continue with regular dentist checkups   4. Lung nodules -Her CT chest from 05/24/20 showed Bandlike lung opacity in the anterior right upper lobe is presumably secondary to radiation therapy.  -Repeat on 08/23/20 showed improvement. Will plan on repeat in a year.   5. 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  -She is s/p ileocecectomy 08/14/15 for benign colon polyp invading appendix. She also has history of recurrent colon polyps. She will f/u with Dr. Havery Moros for screening colonoscopies, last done 10/11/20 was benign. Next due in 3-5 years -She was previously on chemoprevention with Tamoxifen 10/2018-06/17/19, restarted 11/2019 following breast cancer treatment   6. H/o right breast fibroadenoma, S/p right surgical resection on 11/22/15     PLAN:  -proceed with second dose Zometa today -continue tamoxifen -I refilled her trazadone, lexapro, and tamoxifen -screening breast MRI due 05/2021, ordered  -lab, f/u, and Zometa in 6 months with CT chest wo contrast on same day    No problem-specific Assessment & Plan notes found for this encounter.   SUMMARY OF ONCOLOGIC HISTORY: Oncology History Overview Note  Cancer Staging Malignant neoplasm of upper-outer quadrant of right breast in female, estrogen receptor positive ([place]) Staging form: Breast, AJCC 8th Edition - Clinical stage from 06/01/2019: Stage IA (cT1c, cN0, cM0, G3, ER+, PR-, HER2+) - Signed by Truitt Merle, MD on 06/17/2019 -  Pathologic stage from 06/30/2019: Stage IA (pT1c, pN0, cM0, G3, ER+, PR-, HER2+) - Signed by Truitt Merle, MD  on 07/15/2019    Malignant neoplasm of upper-outer quadrant of right breast in female, estrogen receptor positive ([place])  10/2018 - 06/17/2019 Anti-estrogen oral therapy   She was preventively started on Tamoxifen since 10/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 Held after 06/17/19 to proceed with her breast cancer surgery and chemo.    05/06/2019 Breast MRI   IMPRESSION Enhancing right breast mass suspicious for malignancy. Second look [place] if recommended.  No Suspicious enhancing left breast masses.    05/24/2019 Breast US   IMPRESSION 1.8cm irregular right breast mass within the 9:30 position 3.5cm from the nipple is highly suggestive of malignancy. An Ultrasound guided breast biopsy is recommended. The findings of the study and recommendation for biopsy were discussed with the patient directly.    06/01/2019 Cancer Staging   Staging form: Breast, AJCC 8th Edition - Clinical stage from 06/01/2019: Stage IA (cT1c, cN0, cM0, G3, ER+, PR-, HER2+) - Signed by Truitt Merle, MD on 06/17/2019    06/01/2019 Initial Biopsy   Breast Biopsy - Right Breast Core Bx DIAGNOSIS INFILTRATING DUCT CARCINOMA WITH MINOR DUCTAL CARCINOMA IN SITU COMPNENT OF RIGHT BREAST, CORE NEEDLE BIOPSY    06/01/2019 Receptors her2   ER: greater than 75% of tumor cells show moderate staining  PR: Negative  HER2: Positive 3+ Ki67: 40%   06/17/2019 Initial Diagnosis   Malignant neoplasm of upper-outer quadrant of right breast in female, estrogen receptor positive ([place])   06/30/2019 Surgery   RIGHT BREAST LUMPECTOMY WITH RADIOACTIVE SEED AND RIGHT AXILLARY SENTINEL NODE BIOPSY and PAC placement  by Dr Donne Hazel     06/30/2019 Pathology Results   FINAL MICROSCOPIC DIAGNOSIS:   A. BREAST, RIGHT, LUMPECTOMY:  - Invasive ductal carcinoma, grade 3, spanning 1.5 cm.  - Intermediate grade ductal carcinoma in situ.  - Invasive carcinoma is 0.1-0.2 from the final lateral margin (part E).  - Margins are negative for in situ carcinoma.  -  Biopsy site.  - See oncology table.   B. LYMPH NODE, RIGHT AXILLARY, SENTINEL, BIOPSY:  - One of one lymph nodes negative for carcinoma (0/1).   C. LYMPH NODE, RIGHT AXILLARY, SENTINEL, BIOPSY:  - One of one lymph nodes negative for carcinoma (0/1).   D. LYMPH NODE, RIGHT AXILLARY, SENTINEL, BIOPSY:  - One of one lymph nodes negative for carcinoma (0/1).   E. BREAST, RIGHT ADDITIONAL LATERAL MARGIN, EXCISION:  - Invasive ductal carcinoma.  - Invasive carcinoma is 0.1-0.2 cm from the new lateral margin.   F. BREAST, RIGHT ADDITIONAL POSTERIOR MARGIN, EXCISION:  - Fibrocystic change and usual ductal hyperplasia.  - No malignancy identified.   G. BREAST, RIGHT ADDITIONAL SUPERIOR MARGIN, EXCISION:  - Fibrocystic change and usual ductal hyperplasia.  - Incidental radial scar.  - No malignancy identified.     PROGNOSTIC INDICATOR RESULTS:  The tumor cells are POSITIVE for Her2 (3+).  Estrogen Receptor:       POSITIVE, 85%, MODERATE STAINING  Progesterone Receptor:   NEGATIVE  Proliferation Marker Ki-67:   25%    06/30/2019 Cancer Staging   Staging form: Breast, AJCC 8th Edition - Pathologic stage from 06/30/2019: Stage IA (pT1c, pN0, cM0, G3, ER+, PR-, HER2+) - Signed by Truitt Merle, MD on 07/15/2019    07/29/2019 - 07/12/2020 Chemotherapy   Adjuvant Weekly Abraxane (Due to her insurance denial of Abraxane, I changed it to paclitaxel starting 09/15/19) and [employer]  for 12 weeks starting 07/29/19-10/12/19, followed by maintenance trastuzumab q3weeks starting 11/03/19 to complete 1 year of treatment. Due to headaches her trastuzumab (Ogivr) was switched to trastuzumab Calla Kicks) on 09/08/19.     11/24/2019 -  Anti-estrogen oral therapy   Tamoxifen   12/2019 - 01/2020 Radiation Therapy   Outside Radiation in late 2021.       INTERVAL HISTORY:  Sharai Overbay is here for a follow up of breast cancer. She was last seen by me on 11/22/20. She presents to the clinic accompanied by her  sister. They note that they are both going to be grandmothers very soon.   All other systems were reviewed with the patient and are negative.  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   Personal history of colonic polyps    Pneumonia    S/P partial colectomy 2017    SURGICAL HISTORY: Past Surgical History:  Procedure Laterality Date   BREAST LUMPECTOMY Right 06/30/2019   right breast lumpectomy IDC w/ rad w/chemo   BREAST LUMPECTOMY WITH RADIOACTIVE SEED AND SENTINEL LYMPH NODE BIOPSY Right 06/30/2019   Procedure: RIGHT BREAST LUMPECTOMY WITH RADIOACTIVE SEED AND RIGHT AXILLARY SENTINEL NODE BIOPSY;  Surgeon: Rolm Bookbinder, MD;  Location: [place];  Service: General;  Laterality: Right;  PEC BLOCK   BREAST SURGERY     COLON SURGERY  2017   Partial colectomy   COLONOSCOPY     PARATHYROIDECTOMY Left 01/25/2020   Procedure: LEFT INFERIOR PARATHYROIDECTOMY;  Surgeon: Armandina Gemma, MD;  Location: [facility];  Service: General;  Laterality: Left;   POLYPECTOMY     PORT-A-CATH REMOVAL Left 10/24/2020   Procedure: REMOVAL PORT-A-CATH;  Surgeon: Rolm Bookbinder, MD;  Location: [place];  Service: General;  Laterality: Left;   PORTACATH PLACEMENT Left 06/30/2019   Procedure: INSERTION PORT-A-CATH WITH ULTRASOUND GUIDANCE;  Surgeon: Rolm Bookbinder, MD;  Location: [place];  Service: General;  Laterality: Left;   VENTRAL HERNIA REPAIR N/A 01/25/2020   Procedure: HERNIA  REPAIR VENTRAL ADULT WITH MESH;  Surgeon: Armandina Gemma, MD;  Location: [facility];  Service: General;  Laterality: N/A;    I have reviewed the social history and family history with the patient and they are unchanged from previous note.  ALLERGIES:  has No Known Allergies.  MEDICATIONS:  Current Outpatient Medications  Medication Sig Dispense Refill   CALCIUM PO Take by mouth.     Cholecalciferol (VITAMIN D) 50 MCG (2000 UT) tablet Take 2,000 Units by mouth daily.     escitalopram (LEXAPRO) 20 MG tablet Take 1 tablet (20 mg total) by mouth daily. 30 tablet 5   gabapentin (NEURONTIN) 100 MG capsule TAKE 1 CAPSULE BY MOUTH TWICE DAILY, OK TO INCREASE TO $RemoveBef'200MG'RqKUhaOnDE$  AT NIGHT ON SECOND WEEK, AND $RemoveB'300MG'UvLcTDVa$  ON THIRD WEEK IF NEEDED 60 capsule 2   ibuprofen (ADVIL) 200 MG tablet Take 600 mg by mouth every 6 (six) hours as  needed for headache or mild pain.     tamoxifen (NOLVADEX) 20 MG tablet Take 1 tablet (20 mg total) by mouth daily. 90 tablet 1   traZODone (DESYREL) 100 MG tablet Take 1 tablet (100 mg total) by mouth at bedtime as needed for sleep. 30 tablet 3   Zoledronic Acid (ZOMETA IV) Inject into the vein. INFUSION EVERY 6 MONTHS FOR BONES AFTER CHEMO FOR BREAST CANCER     No current facility-administered medications for this visit.    PHYSICAL EXAMINATION: ECOG PERFORMANCE STATUS: 0 - Asymptomatic  Vitals:   02/28/21 1425  BP: 123/70  Pulse: 77  Resp: 18  Temp: 98.3 F (36.8 C)  SpO2: 100%   Wt Readings from Last 3 Encounters:  02/28/21 183 lb 9.6 oz (83.3 kg)  11/22/20 191 lb 1.6 oz (86.7 kg)  10/24/20 192 lb 0.3 oz (87.1 kg)     GENERAL:alert, no distress and comfortable SKIN: skin color, texture, turgor are normal, no rashes or significant lesions EYES: normal, Conjunctiva are pink and non-injected, sclera clear  NECK: supple, thyroid normal size, non-tender, without nodularity LYMPH:  no palpable lymphadenopathy in the cervical, axillary  LUNGS: clear to auscultation and  percussion with normal breathing effort HEART: regular rate & rhythm and no murmurs and no lower extremity edema ABDOMEN:abdomen soft, non-tender and normal bowel sounds Musculoskeletal:no cyanosis of digits and no clubbing  NEURO: alert & oriented x 3 with fluent speech, no focal motor/sensory deficits BREAST: No palpable mass, nodules or adenopathy bilaterally. Breast exam benign.   LABORATORY DATA:  I have reviewed the data as listed CBC Latest Ref Rng & Units 02/28/2021 11/22/2020 08/30/2020  WBC 4.0 - 10.5 K/uL 6.2 5.5 6.6  Hemoglobin 12.0 - 15.0 g/dL 14.7 14.7 14.1  Hematocrit 36.0 - 46.0 % 42.5 42.3 40.7  Platelets 150 - 400 K/uL 224 245 216     CMP Latest Ref Rng & Units 02/28/2021 11/22/2020 08/30/2020  Glucose 70 - 99 mg/dL 91 130(H) 121(H)  BUN 6 - 20 mg/dL $Remove'13 12 13  'rbbcYNi$ Creatinine 0.44 - 1.00 mg/dL 0.96 1.04(H) 1.12(H)  Sodium 135 - 145 mmol/L 139 138 139  Potassium 3.5 - 5.1 mmol/L 3.6 3.7 4.1  Chloride 98 - 111 mmol/L 106 105 106  CO2 22 - 32 mmol/L $RemoveB'26 24 26  'vKOmbuRy$ Calcium 8.9 - 10.3 mg/dL 9.2 9.5 9.3  Total Protein 6.5 - 8.1 g/dL 7.0 7.1 6.8  Total Bilirubin 0.3 - 1.2 mg/dL 0.4 0.4 0.3  Alkaline Phos 38 - 126 U/L 39 42 63  AST 15 - 41 U/L $Remo'15 18 15  'LrUSG$ ALT 0 - 44 U/L $Remo'10 14 10      'KKUsy$ RADIOGRAPHIC STUDIES: I have personally reviewed the radiological images as listed and agreed with the findings in the report. No results found.    Orders Placed This Encounter  Procedures   MR BREAST BILATERAL W WO CONTRAST INC CAD    Standing Status:   Future    Standing Expiration Date:   02/28/2022    Order Specific Question:   If indicated for the ordered procedure, I authorize the administration of contrast media per Radiology protocol    Answer:   Yes    Order Specific Question:   What is the patient's sedation requirement?    Answer:   No Sedation    Order Specific Question:   Does the patient have a pacemaker or implanted devices?    Answer:   No    Order Specific Question:  Radiology Contrast Protocol - do NOT remove file path    Answer:   [email or web address]    Order Specific Question:   Preferred imaging location?    Answer:   [facility] (table limit - 550 lbs)   CT Chest Wo Contrast    Standing Status:   Future    Standing Expiration Date:   03/02/2022    Order Specific Question:   Is patient pregnant?    Answer:   No    Order Specific Question:   Preferred imaging location?    Answer:   [facility]   All questions were answered. The patient knows to call the clinic with any problems, questions or concerns. No barriers to learning was detected. The total time spent in the appointment was 30 minutes.     Truitt Merle, MD 02/28/2021   I, Wilburn Mylar, am acting as scribe for Truitt Merle, MD.   I have reviewed the above documentation for accuracy and completeness, and I agree with the above.

## 2021-03-02 ENCOUNTER — Encounter: Payer: Self-pay | Admitting: Hematology

## 2021-04-04 ENCOUNTER — Other Ambulatory Visit: Payer: Self-pay | Admitting: Hematology

## 2021-04-04 DIAGNOSIS — C50411 Malignant neoplasm of upper-outer quadrant of right female breast: Secondary | ICD-10-CM

## 2021-04-04 DIAGNOSIS — Z170 Estrogen receptor positive status [ER+]: Secondary | ICD-10-CM

## 2021-05-16 ENCOUNTER — Ambulatory Visit (HOSPITAL_COMMUNITY)
Admission: RE | Admit: 2021-05-16 | Discharge: 2021-05-16 | Disposition: A | Discharge status: Home / Self Care | Payer: BC Managed Care – PPO | Source: Ambulatory Visit | Attending: Hematology | Admitting: Hematology

## 2021-05-16 DIAGNOSIS — Z1509 Genetic susceptibility to other malignant neoplasm: Secondary | ICD-10-CM | POA: Insufficient documentation

## 2021-05-16 DIAGNOSIS — Z1502 Genetic susceptibility to malignant neoplasm of ovary: Secondary | ICD-10-CM | POA: Insufficient documentation

## 2021-05-16 DIAGNOSIS — Z1501 Genetic susceptibility to malignant neoplasm of breast: Secondary | ICD-10-CM | POA: Insufficient documentation

## 2021-05-16 DIAGNOSIS — Z1589 Genetic susceptibility to other disease: Secondary | ICD-10-CM | POA: Insufficient documentation

## 2021-05-16 DIAGNOSIS — Z853 Personal history of malignant neoplasm of breast: Secondary | ICD-10-CM | POA: Insufficient documentation

## 2021-05-16 DIAGNOSIS — Z1239 Encounter for other screening for malignant neoplasm of breast: Secondary | ICD-10-CM | POA: Insufficient documentation

## 2021-05-16 IMAGING — MR MR BREAST BILAT WO/W CM
7 of 10 series · 29 of 48 positions shown · IV contrast (8 GADAVIST)
Comparison: Previous exam(s).

CLINICAL DATA: High risk screening. History of right breast cancer
status post lumpectomy in [QN]. BRCA positive.

EXAM:
BILATERAL BREAST MRI WITH AND WITHOUT CONTRAST
TECHNIQUE: Multiplanar, multisequence MR images of both breasts were obtained
prior to and following the intravenous administration of 8 ml of
Gadavist

[Series 2: T2 · axial · 3.0mm · 0.91mm/px · 1 of 70 slices shown]
[im 1/70]
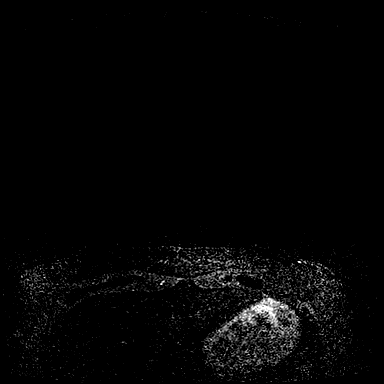

[Series 3: T1 fat-sat · axial · 1.2mm · 0.78mm/px · z∈[-108,+83]mm · 5 of 160 slices shown (1 of 4)]
[im 1/160]
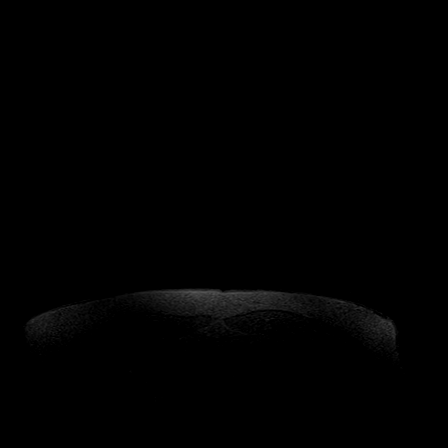
[im 40/160]
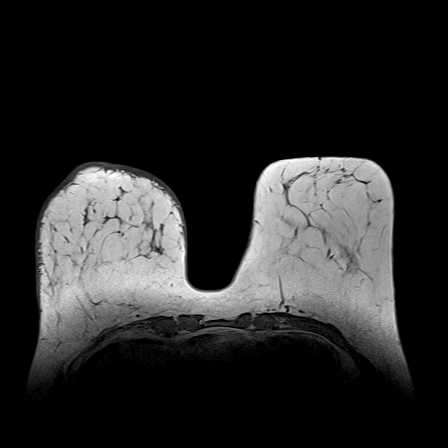
[im 80/160]
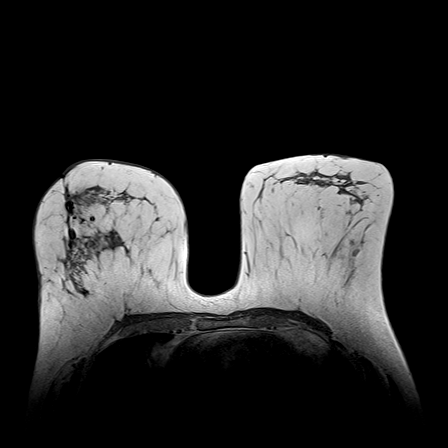
[im 120/160]
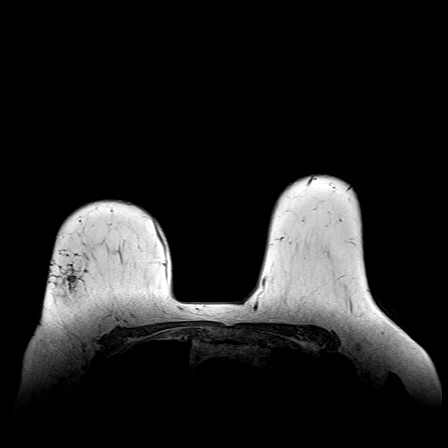
[im 160/160]
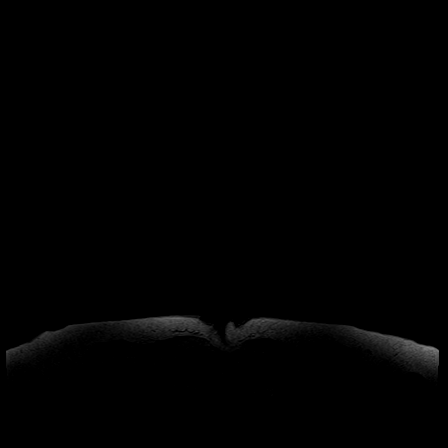

[Series 5: T1 fat-sat · axial · 1.2mm · 0.84mm/px · z∈[-108,+83]mm · 5 of 160 slices shown (2 of 4)]
[im 1/160]
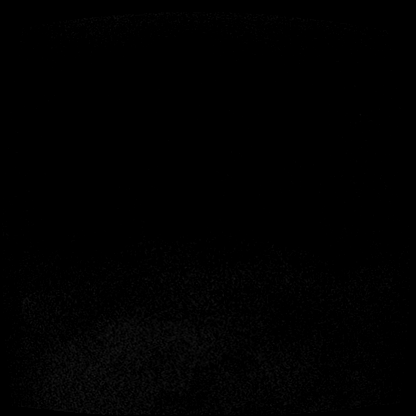
[im 40/160]
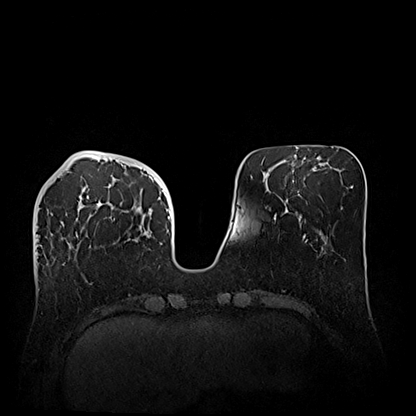
[im 80/160]
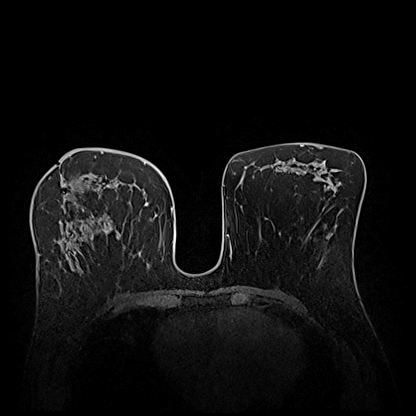
[im 120/160]
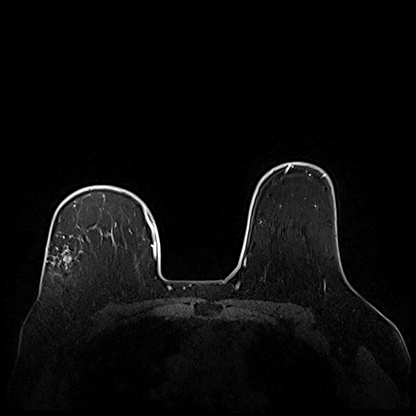
[im 160/160]
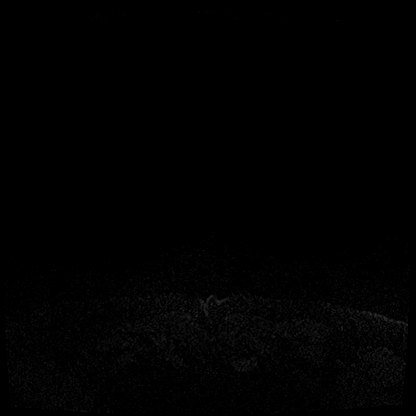

[Series 6: T1 fat-sat · axial · 1.2mm · 0.84mm/px · z∈[-108,+83]mm · 6 of 160 slices shown (3 of 4)]
[im 1/160]
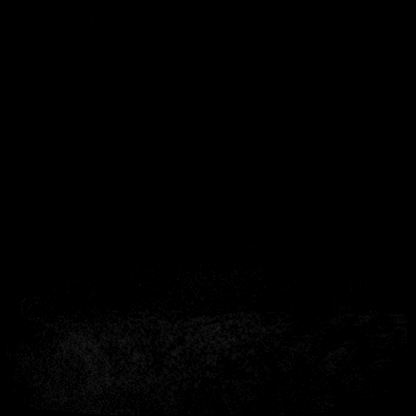
[im 32/160]
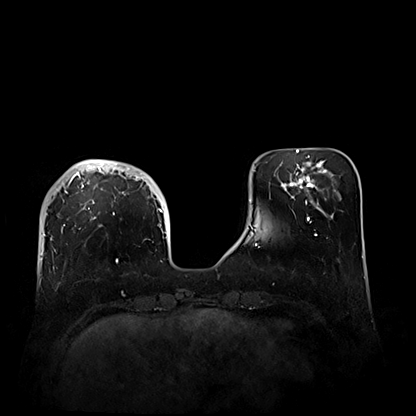
[im 64/160]
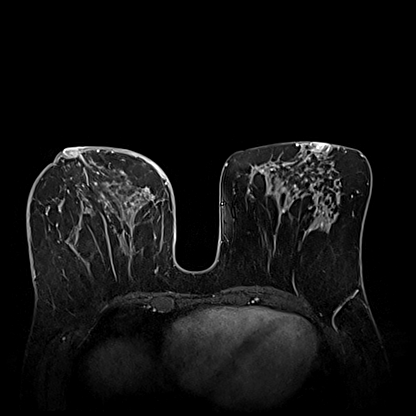
[im 96/160]
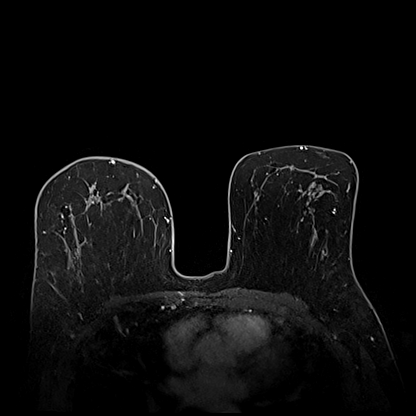
[im 128/160]
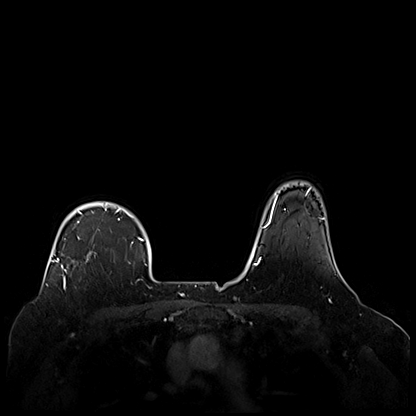
[im 160/160]
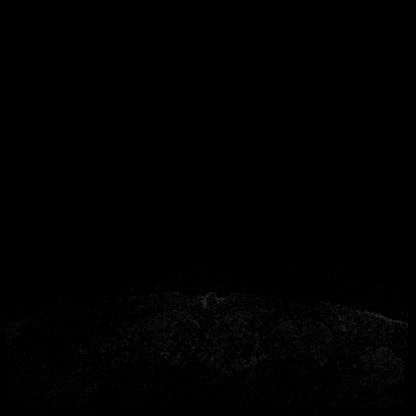

[Series 7: T1 · axial · 1.2mm · 0.84mm/px · z∈[-108,+83]mm · 6 of 160 slices shown (1 of 2)]
[im 1/160]
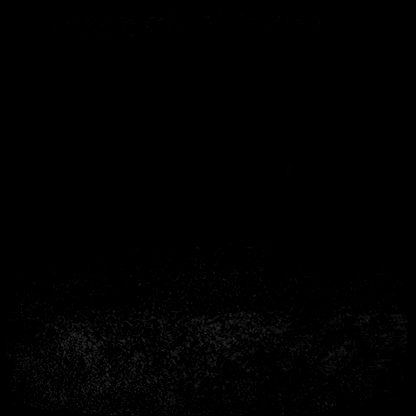
[im 32/160]
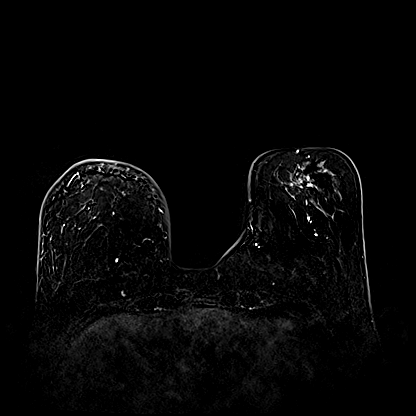
[im 64/160]
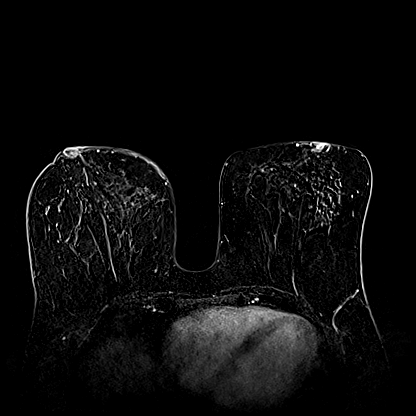
[im 96/160]
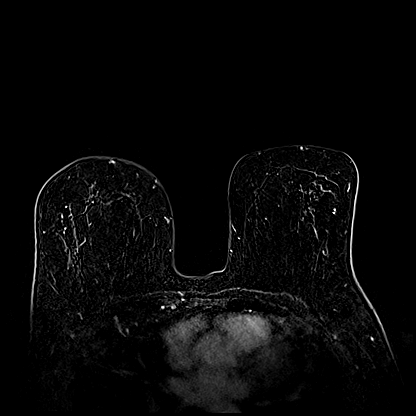
[im 128/160]
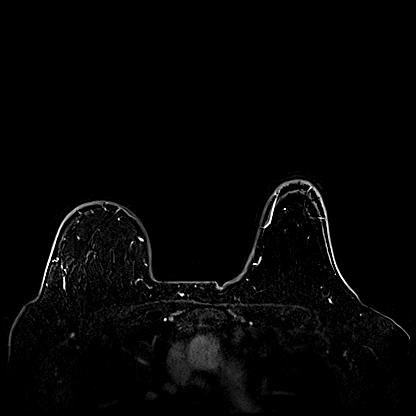
[im 160/160]
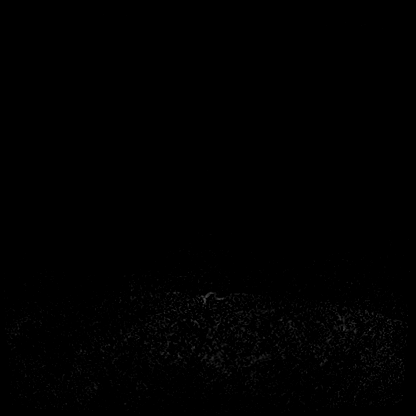

[Series 9: T1 · axial · 192.0mm · 0.84mm/px · 1 of 2 slices shown (2 of 2)]
[im 1/2]
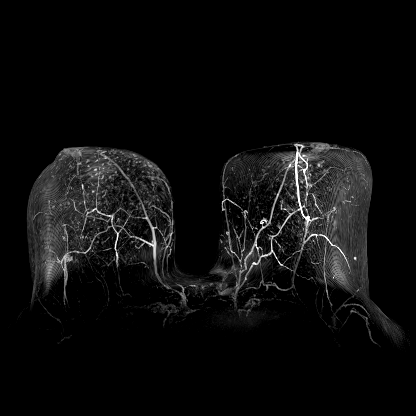

[Series 10: T1 fat-sat · axial · 1.2mm · 0.84mm/px · z∈[-108,+45]mm · 5 of 159 slices shown (4 of 4)]
[im 1/159]
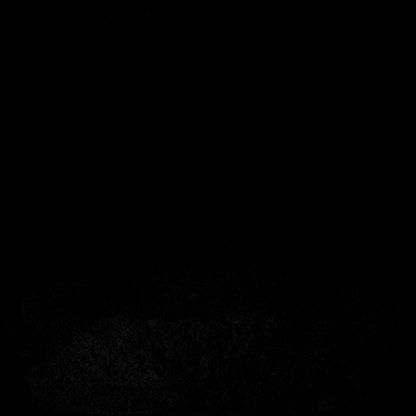
[im 32/159]
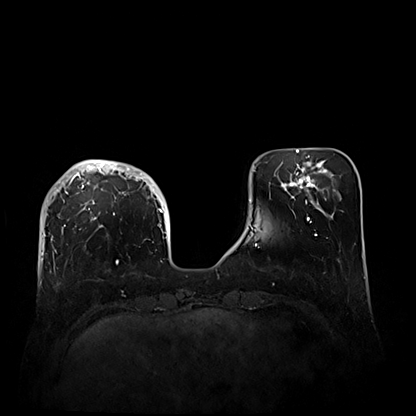
[im 64/159]
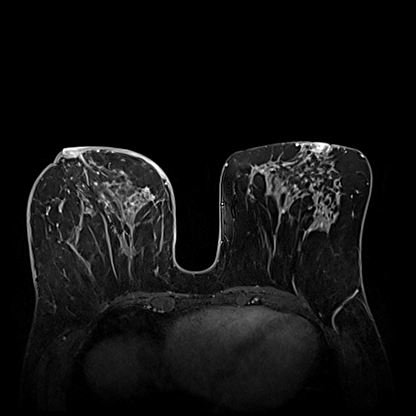
[im 95/159]
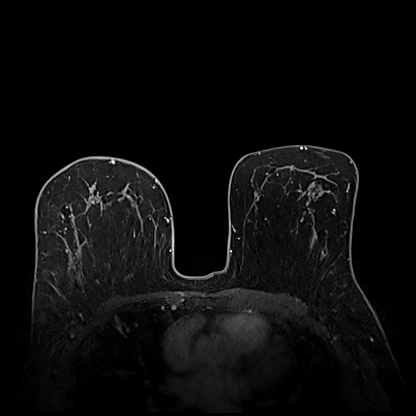
[im 127/159]
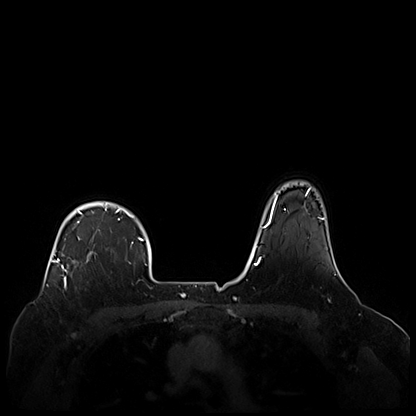

[29 of 48 positions shown; findings below may reference images not displayed]

Three-dimensional MR images were rendered by post-processing of the
original MR data on an independent workstation. The
three-dimensional MR images were interpreted, and findings are
reported in the following complete MRI report for this study. Three
dimensional images were evaluated at the independent interpreting
workstation using the DynaCAD thin client.
FINDINGS: Breast composition: b. Scattered fibroglandular tissue.

Background parenchymal enhancement: Minimal

Right breast: Nonenhancing diffuse mild skin thickening consistent
with post radiation change. Benign postsurgical changes in the outer
right breast. No mass or abnormal enhancement.

Left breast: No mass or abnormal enhancement.

Lymph nodes: No abnormal appearing lymph nodes.

Ancillary findings:  None.
IMPRESSION: No MRI evidence of malignancy in either breast.

RECOMMENDATION:
1.  Continue routine annual mammography.

2.  Continue annual high risk screening breast MRI.

BI-RADS CATEGORY  2: Benign.

## 2021-05-16 MED ORDER — GADOBUTROL 1 MMOL/ML IV SOLN
8.0000 mL | Freq: Once | INTRAVENOUS | Status: AC | PRN
  Administered 2021-05-16: 8 mL via INTRAVENOUS

## 2021-05-18 ENCOUNTER — Encounter: Payer: Self-pay | Admitting: Hematology

## 2021-06-08 ENCOUNTER — Other Ambulatory Visit: Payer: Self-pay | Admitting: Hematology

## 2021-06-08 DIAGNOSIS — C50411 Malignant neoplasm of upper-outer quadrant of right female breast: Secondary | ICD-10-CM

## 2021-06-20 ENCOUNTER — Other Ambulatory Visit: Payer: Self-pay | Admitting: Hematology

## 2021-07-10 ENCOUNTER — Other Ambulatory Visit: Payer: Self-pay | Admitting: Oncology

## 2021-07-10 ENCOUNTER — Other Ambulatory Visit: Payer: Self-pay | Admitting: Hematology

## 2021-07-10 DIAGNOSIS — Z170 Estrogen receptor positive status [ER+]: Secondary | ICD-10-CM

## 2021-07-10 MED ORDER — GABAPENTIN 100 MG PO CAPS: 100.0000 mg | ORAL_CAPSULE | Freq: Two times a day (BID) | ORAL | 0 refills | Status: DC

## 2021-07-18 ENCOUNTER — Ambulatory Visit: Payer: BC Managed Care – PPO | Admitting: Medical

## 2021-07-18 ENCOUNTER — Ambulatory Visit (HOSPITAL_COMMUNITY)
Admission: RE | Admit: 2021-07-18 | Discharge: 2021-07-18 | Disposition: A | Discharge status: Home / Self Care | Payer: BC Managed Care – PPO | Source: Ambulatory Visit | Attending: Hematology | Admitting: Hematology

## 2021-07-18 DIAGNOSIS — C50919 Malignant neoplasm of unspecified site of unspecified female breast: Secondary | ICD-10-CM | POA: Diagnosis not present

## 2021-07-18 DIAGNOSIS — J439 Emphysema, unspecified: Secondary | ICD-10-CM | POA: Diagnosis not present

## 2021-07-18 DIAGNOSIS — Z170 Estrogen receptor positive status [ER+]: Secondary | ICD-10-CM | POA: Diagnosis not present

## 2021-07-18 DIAGNOSIS — C50411 Malignant neoplasm of upper-outer quadrant of right female breast: Secondary | ICD-10-CM | POA: Insufficient documentation

## 2021-07-18 DIAGNOSIS — R918 Other nonspecific abnormal finding of lung field: Secondary | ICD-10-CM | POA: Diagnosis not present

## 2021-07-18 IMAGING — CT CT CHEST W/O CM
2 of 4 series · 15 of 36 positions shown, 18 images · non-contrast
Comparison: [DATE]

CLINICAL DATA: Breast cancer, right lung nodule * Tracking Code: BO
*



[Series 2: thorax · axial · 0.81mm/px · z∈[+1291,+1553]mm · 12 of 155 slices shown, 15 images]
[im 12/155  mediastinal]
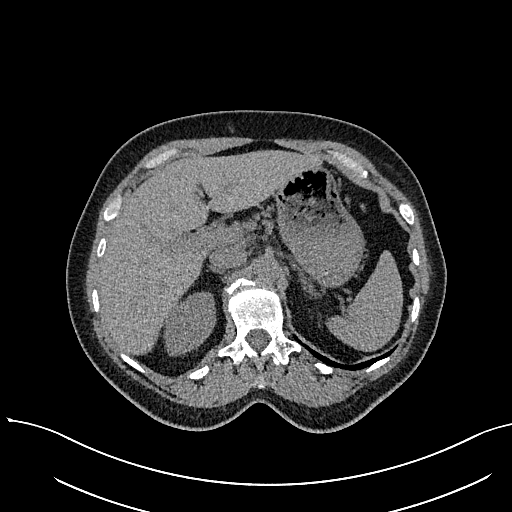
[im 12/155  lung]
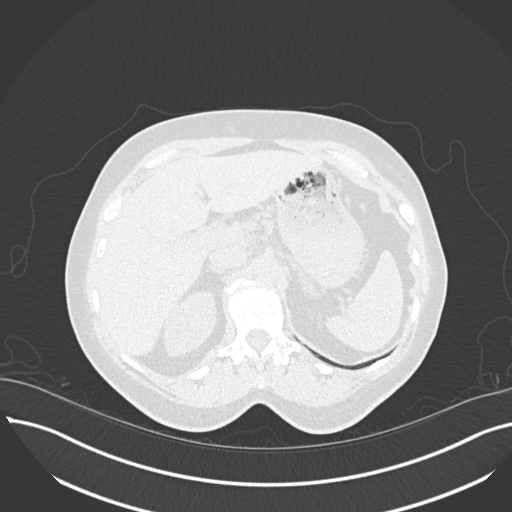
[im 24/155  lung]
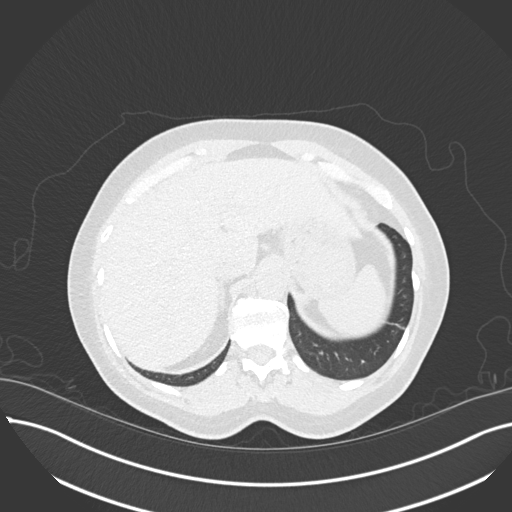
[im 36/155  lung]
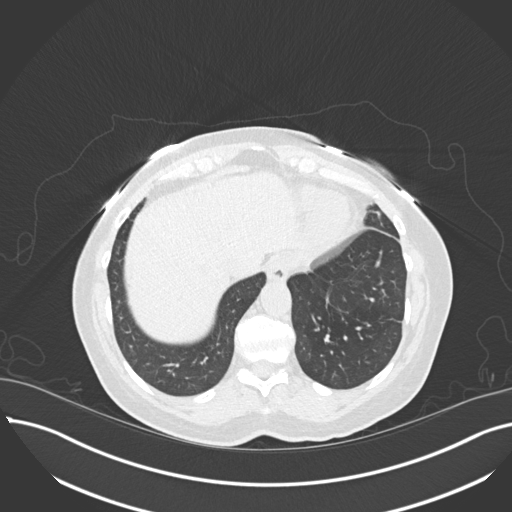
[im 48/155  lung]
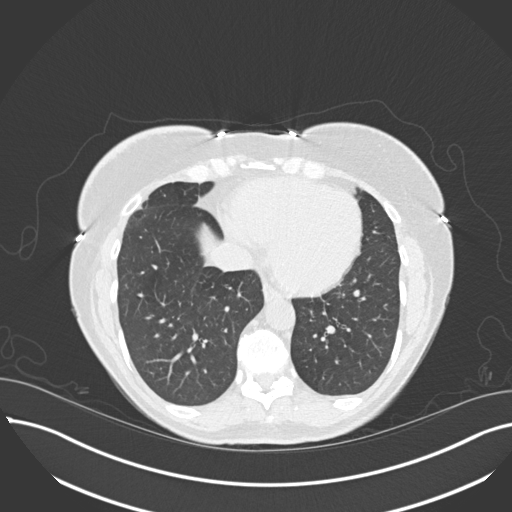
[im 60/155  mediastinal]
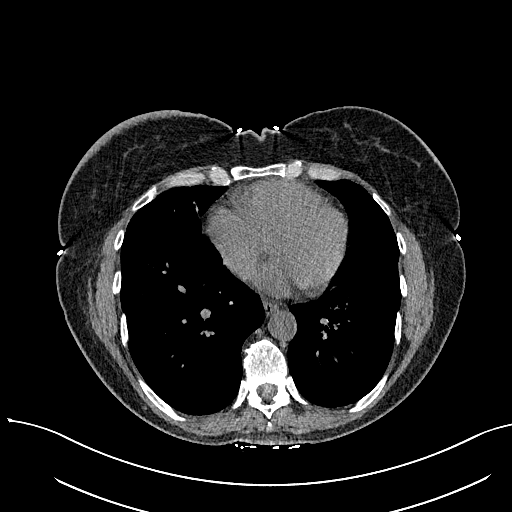
[im 60/155  lung]
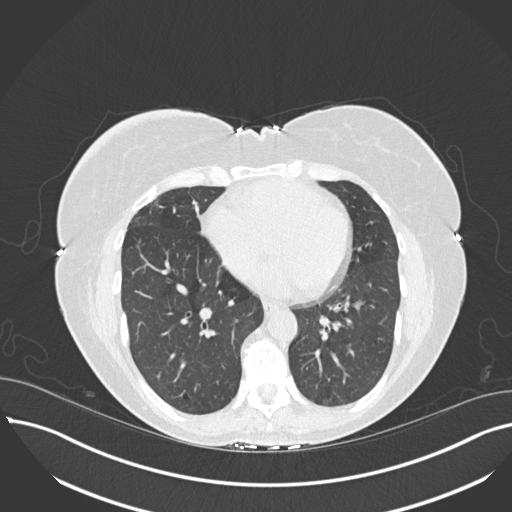
[im 72/155  lung]
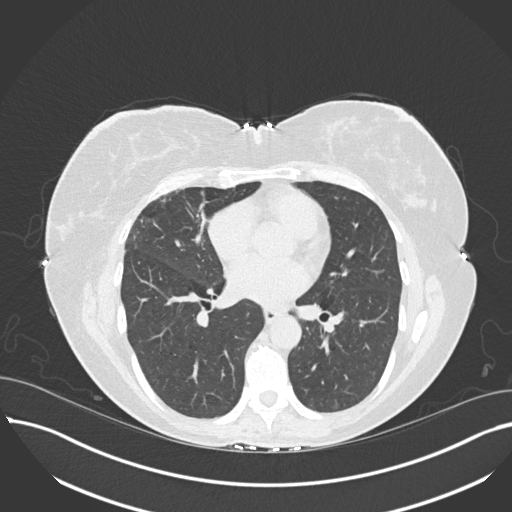
[im 83/155  lung]
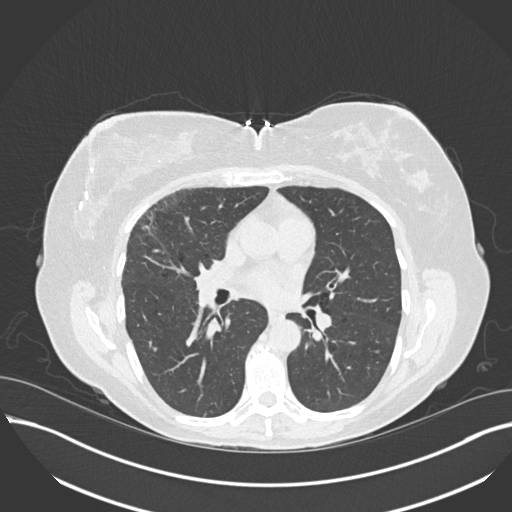
[im 95/155  lung]
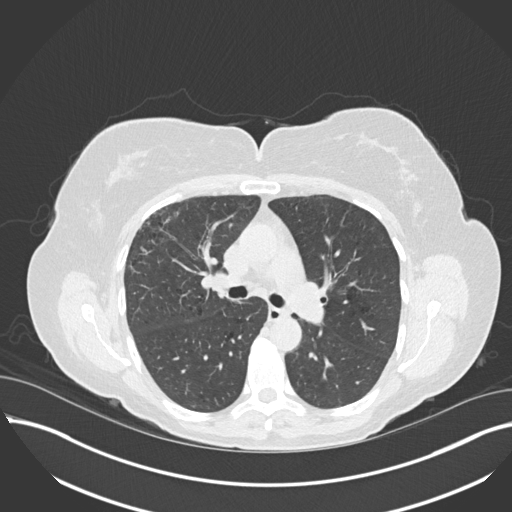
[im 107/155  mediastinal]
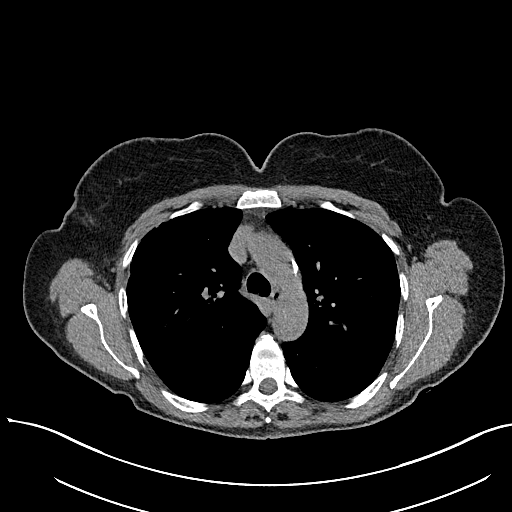
[im 107/155  lung]
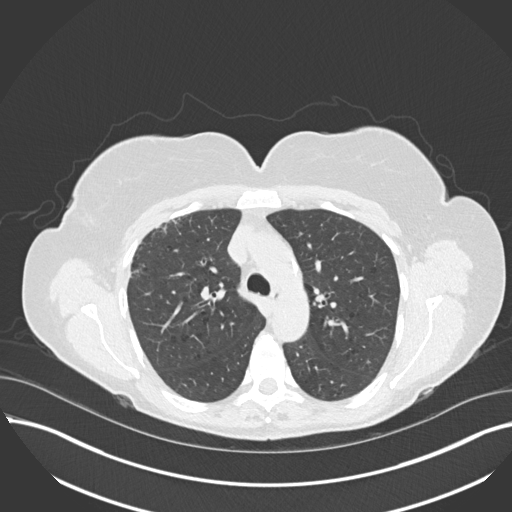
[im 119/155  lung]
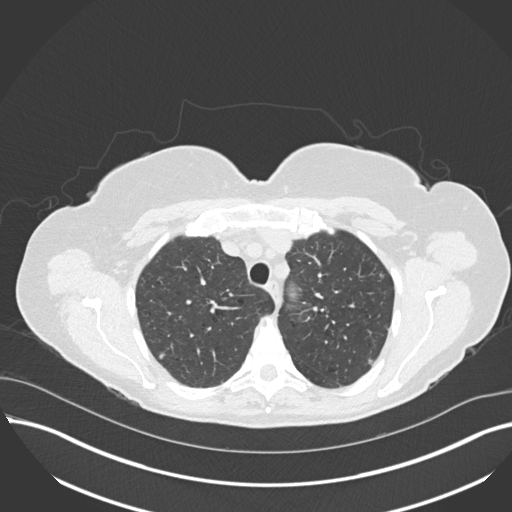
[im 131/155  lung]
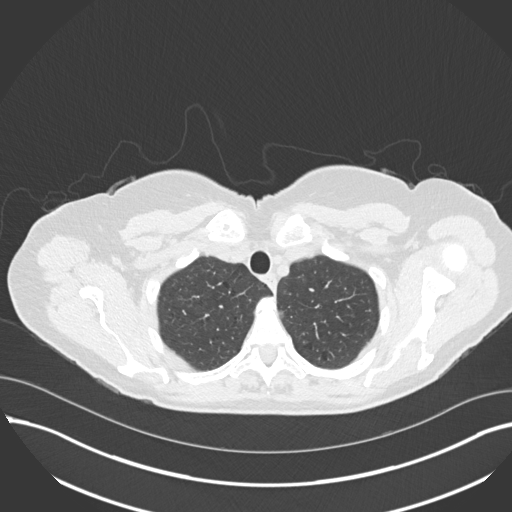
[im 143/155  lung]
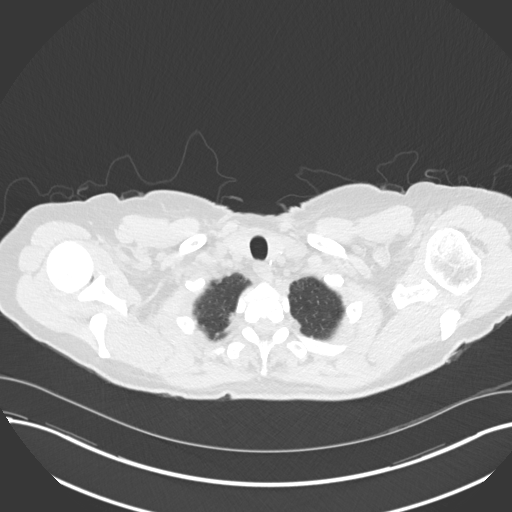

[Series 6: coronal · coronal · 0.62mm/px · 3 of 154 slices shown]
[im 31/154  lung]
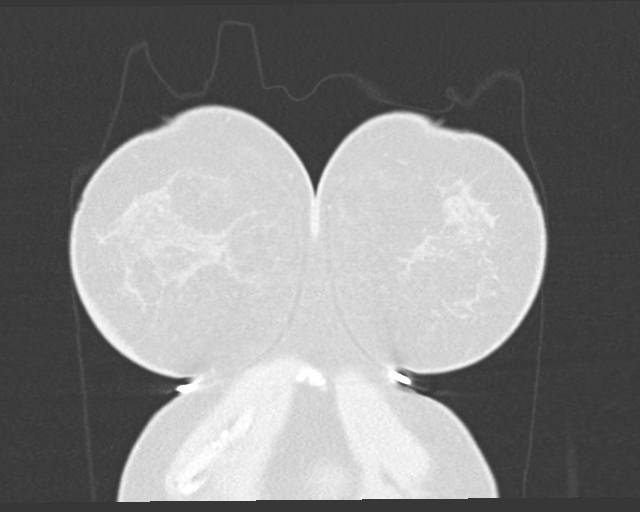
[im 62/154  lung]
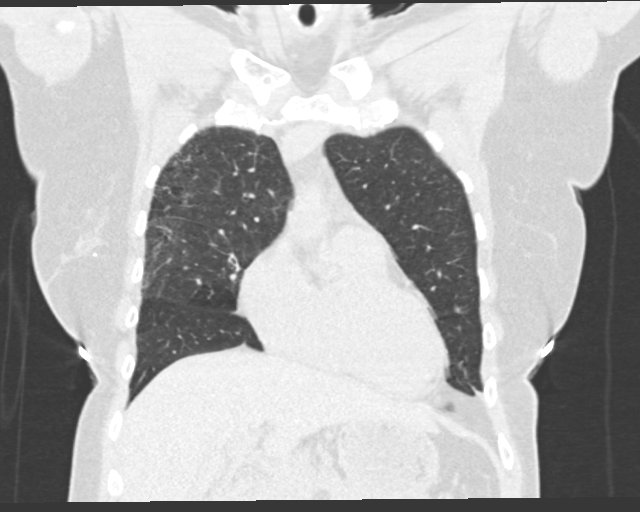
[im 92/154  lung]
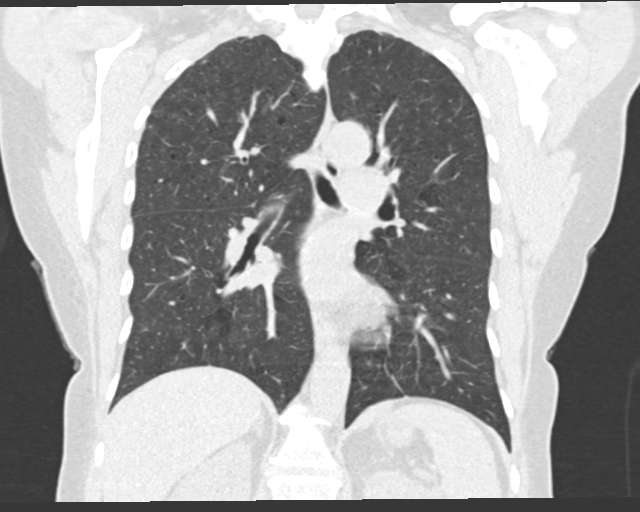

[15 of 36 positions shown; findings below may reference images not displayed]

FINDINGS: Cardiovascular: Aortic atherosclerosis. Normal heart size. No
pericardial effusion.

Mediastinum/Nodes: No enlarged mediastinal, hilar, or axillary lymph
nodes. Thyroid gland, trachea, and esophagus demonstrate no
significant findings.

Lungs/Pleura: Mild centrilobular and paraseptal emphysema. Diffuse
bilateral bronchial wall thickening. Continued interval decrease in
size and solid character of a subpleural nodule of the peripheral
right upper lobe, measuring 0.5 x 0.5 cm, previously 0.7 x 0.6 cm
(series 5, image 45). Interval decrease in adjacent radiation
pneumonitis in the anterior right upper and right middle lobes. New,
somewhat ill-defined 0.5 cm nodule of the posterior right upper lobe
(series 5, image 37). Background of fine centrilobular pulmonary
nodules most concentrated in the lung apices. No pleural effusion or
pneumothorax.

Upper Abdomen: No acute abnormality.

Musculoskeletal: Status post right lumpectomy. No chest wall
abnormality. No suspicious osseous lesions identified.
IMPRESSION: 1. Continued interval decrease in size and solid character of a
subpleural nodule of the peripheral right upper lobe, again
consistent with continued treatment response or alternately
resolution of nonspecific infection or inflammation.
2. Interval decrease in adjacent radiation pneumonitis in the
anterior right upper and right middle lobes.
3. New, somewhat ill-defined 0.5 cm nodule of the posterior right
upper lobe, nonspecific, morphology favoring infection or
inflammation. Attention on follow-up.
4. Emphysema and diffuse bilateral bronchial wall thickening.
Background of fine centrilobular pulmonary nodules most concentrated
in the lung apices, consistent with smoking-related respiratory
bronchiolitis.

Aortic Atherosclerosis ([HH]-[HH]) and Emphysema ([HH]-[HH]).

## 2021-07-21 ENCOUNTER — Encounter: Payer: Self-pay | Admitting: Hematology

## 2021-08-01 DIAGNOSIS — H2513 Age-related nuclear cataract, bilateral: Secondary | ICD-10-CM | POA: Diagnosis not present

## 2021-08-01 DIAGNOSIS — H5203 Hypermetropia, bilateral: Secondary | ICD-10-CM | POA: Diagnosis not present

## 2021-08-20 ENCOUNTER — Other Ambulatory Visit: Payer: Self-pay | Admitting: Hematology

## 2021-08-26 ENCOUNTER — Other Ambulatory Visit: Payer: Self-pay

## 2021-08-29 ENCOUNTER — Inpatient Hospital Stay: Payer: BC Managed Care – PPO | Attending: Hematology

## 2021-08-29 ENCOUNTER — Inpatient Hospital Stay (HOSPITAL_BASED_OUTPATIENT_CLINIC_OR_DEPARTMENT_OTHER): Payer: BC Managed Care – PPO | Admitting: Hematology

## 2021-08-29 ENCOUNTER — Encounter: Payer: Self-pay | Admitting: Hematology

## 2021-08-29 ENCOUNTER — Other Ambulatory Visit: Payer: Self-pay

## 2021-08-29 ENCOUNTER — Inpatient Hospital Stay: Payer: BC Managed Care – PPO

## 2021-08-29 VITALS — BP 132/74 | HR 68 | Temp 98.4°F | Resp 18 | Ht 69.0 in | Wt 177.5 lb

## 2021-08-29 DIAGNOSIS — Z170 Estrogen receptor positive status [ER+]: Secondary | ICD-10-CM | POA: Insufficient documentation

## 2021-08-29 DIAGNOSIS — M8580 Other specified disorders of bone density and structure, unspecified site: Secondary | ICD-10-CM | POA: Insufficient documentation

## 2021-08-29 DIAGNOSIS — F32A Depression, unspecified: Secondary | ICD-10-CM | POA: Diagnosis not present

## 2021-08-29 DIAGNOSIS — C50411 Malignant neoplasm of upper-outer quadrant of right female breast: Secondary | ICD-10-CM

## 2021-08-29 DIAGNOSIS — Z95828 Presence of other vascular implants and grafts: Secondary | ICD-10-CM

## 2021-08-29 DIAGNOSIS — F17210 Nicotine dependence, cigarettes, uncomplicated: Secondary | ICD-10-CM | POA: Diagnosis not present

## 2021-08-29 DIAGNOSIS — R918 Other nonspecific abnormal finding of lung field: Secondary | ICD-10-CM | POA: Diagnosis not present

## 2021-08-29 DIAGNOSIS — G4700 Insomnia, unspecified: Secondary | ICD-10-CM | POA: Insufficient documentation

## 2021-08-29 DIAGNOSIS — Z1501 Genetic susceptibility to malignant neoplasm of breast: Secondary | ICD-10-CM

## 2021-08-29 DIAGNOSIS — R221 Localized swelling, mass and lump, neck: Secondary | ICD-10-CM | POA: Diagnosis not present

## 2021-08-29 LAB — CBC WITH DIFFERENTIAL (CANCER CENTER ONLY)
Abs Immature Granulocytes: 0.01 10*3/uL (ref 0.00–0.07)
Basophils Absolute: 0 10*3/uL (ref 0.0–0.1)
Basophils Relative: 1 %
Eosinophils Absolute: 0.1 10*3/uL (ref 0.0–0.5)
Eosinophils Relative: 1 %
HCT: 42.3 % (ref 36.0–46.0)
Hemoglobin: 15 g/dL (ref 12.0–15.0)
Immature Granulocytes: 0 %
Lymphocytes Relative: 33 %
Lymphs Abs: 2 10*3/uL (ref 0.7–4.0)
MCH: 32.4 pg (ref 26.0–34.0)
MCHC: 35.5 g/dL (ref 30.0–36.0)
MCV: 91.4 fL (ref 80.0–100.0)
Monocytes Absolute: 0.4 10*3/uL (ref 0.1–1.0)
Monocytes Relative: 7 %
Neutro Abs: 3.6 10*3/uL (ref 1.7–7.7)
Neutrophils Relative %: 58 %
Platelet Count: 187 10*3/uL (ref 150–400)
RBC: 4.63 MIL/uL (ref 3.87–5.11)
RDW: 13.2 % (ref 11.5–15.5)
WBC Count: 6.1 10*3/uL (ref 4.0–10.5)
nRBC: 0 % (ref 0.0–0.2)

## 2021-08-29 LAB — CMP (CANCER CENTER ONLY)
ALT: 8 U/L (ref 0–44)
AST: 13 U/L — ABNORMAL LOW (ref 15–41)
Albumin: 4.1 g/dL (ref 3.5–5.0)
Alkaline Phosphatase: 36 U/L — ABNORMAL LOW (ref 38–126)
Anion gap: 8 (ref 5–15)
BUN: 15 mg/dL (ref 6–20)
CO2: 25 mmol/L (ref 22–32)
Calcium: 9.6 mg/dL (ref 8.9–10.3)
Chloride: 104 mmol/L (ref 98–111)
Creatinine: 0.93 mg/dL (ref 0.44–1.00)
GFR, Estimated: >60 mL/min (ref >60)
Glucose, Bld: 123 mg/dL — ABNORMAL HIGH (ref 70–99)
Potassium: 3.8 mmol/L (ref 3.5–5.1)
Sodium: 137 mmol/L (ref 135–145)
Total Bilirubin: 0.4 mg/dL (ref 0.3–1.2)
Total Protein: 6.9 g/dL (ref 6.5–8.1)

## 2021-08-29 MED ORDER — TRAZODONE HCL 100 MG PO TABS: ORAL_TABLET | ORAL | 1 refills | Status: DC

## 2021-08-29 MED ORDER — SODIUM CHLORIDE 0.9 % IV SOLN: Freq: Once | INTRAVENOUS | Status: AC

## 2021-08-29 MED ORDER — ESCITALOPRAM OXALATE 20 MG PO TABS: ORAL_TABLET | ORAL | 1 refills | Status: DC

## 2021-08-29 MED ORDER — TAMOXIFEN CITRATE 20 MG PO TABS: 20.0000 mg | ORAL_TABLET | Freq: Every day | ORAL | 1 refills | Status: DC

## 2021-08-29 MED ORDER — ZOLEDRONIC ACID 4 MG/100ML IV SOLN
4.0000 mg | Freq: Once | INTRAVENOUS | Status: AC
  Administered 2021-08-29: 4 mg via INTRAVENOUS
  Filled 2021-08-29: qty 100

## 2021-08-29 NOTE — Progress Notes (Signed)
[place]   [phone] [phone]   Clinic Follow up Note   Patient Care Team: Patient, [name] as PCP - General ([place]) Minus Breeding, MD as PCP - Cardiology (Cardiology) Armbruster, Carlota Raspberry, MD as Consulting Physician (Gastroenterology) Eyvonne Mechanic as Counselor (Licensed Clinical Social Worker) Truitt Merle, MD as Consulting Physician (Hematology) Alla Feeling, NP as Nurse Practitioner (Nurse Practitioner) Mauro Kaufmann, RN as Oncology Nurse Navigator Rockwell Germany, RN as Oncology Nurse Navigator Rolm Bookbinder, MD as Consulting Physician (General Surgery)  Date of Service:  08/29/2021  CHIEF COMPLAINT: f/u of right breast cancer  CURRENT THERAPY:  Tamoxifen, started 11/2019  ASSESSMENT & PLAN:  Abigail Berg is a 56 y.o. peri?/post-menopausal female with   1. Malignant neoplasm of upper-outer quadrant of right breast, Stage 1A, p(T1cN0M0), stage IA, ER+/PR-/HER2+, Grade 3.  -diagnosed in 05/2019. S/p right breast lumpectomy, weekly Abraxane and trastuzumab 07/29/19 - 10/12/19, adjuvant radiation, and maintenance trastuzumab Calla Kicks) q3weeks 11/03/19 - 07/12/20. -She started adjuvant tamoxifen in 11/2019, tolerating well overall with mild hot flashes. Her last period was 02/2019, prior to chemo. Last [facility] 11/22/20 showed she is still perimenopausal.  -most recent screening MRI on 05/16/21 and mammogram on 11/22/20 were benign. -she is clinically doing well. Labs reviewed, WNL. Physical exam was unremarkable. -Continue breast cancer surveillance   2. Osteopenia  -Most recent bone density from 12/08/19 was -1.7. -She began Zometa, q48month, on 08/30/20. She tolerates well overall with bone pain. She asked today if she can receive her final dose in December due to insurance. We will check this for her. -I advised her to continue calcium, especially on Zometa. -She knows to continue with regular dentist checkups and cleaning     3. Lung nodules, Tobacco use -current smoker, 1/2 ppd -initially seen on chest CT 05/24/20. -repeat CT chest 07/18/21 showed decrease in RUL nodule and adjacent radiation pneumonitis; new nonspecific 0.5 cm nodule in RUL. -will repeat CT in 12 months    4. 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  -she has h/o colon polyps, last screening colonoscopy 10/11/20 with Dr. AHavery Moroswas benign. Next due in 3-5 years -She was previously on chemoprevention with Tamoxifen 10/2018-06/17/19, restarted 11/2019 following breast cancer treatment  5. Neck Swelling -she reports intermittent neck swelling. -she confirms she is still smoking -she has mild tenderness on exam, but I do not feel any nodules or concerning lumps. -I recommend monitoring for now. I also advised her to mention it to her dentist as it could be related to dental issues.  6. Depression, Insomnia -managed with lexapro and trazadone     PLAN:  -proceed with third dose Zometa today -continue tamoxifen -I refilled her trazadone, lexapro, and tamoxifen -screening mammogram due 11/2021, ordered today -lab, f/u, and Zometa in 01/2022 if insurance approval. Will order breast MRI and CT chest on next visit    No problem-specific Assessment & Plan notes found for this encounter.   SUMMARY OF ONCOLOGIC HISTORY: Oncology History Overview Note  Cancer Staging Malignant neoplasm of upper-outer quadrant of right breast in female, estrogen receptor positive [place] Staging form: Breast, AJCC 8th Edition - Clinical stage from 06/01/2019: Stage IA (cT1c, cN0, cM0, G3, ER+, PR-, HER2+) - Signed by FTruitt Merle MD on 06/17/2019 - Pathologic stage from 06/30/2019: Stage IA (pT1c,  pN0, cM0, G3, ER+, PR-, HER2+) - Signed by Truitt Merle, MD on 07/15/2019    Malignant neoplasm of upper-outer  quadrant of right breast in female, estrogen receptor positive ([place])  10/2018 - 06/17/2019 Anti-estrogen oral therapy   She was preventively started on Tamoxifen since 10/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 Held after 06/17/19 to proceed with her breast cancer surgery and chemo.    05/06/2019 Breast MRI   IMPRESSION Enhancing right breast mass suspicious for malignancy. Second look [place] if recommended.  No Suspicious enhancing left breast masses.    05/24/2019 Breast US   IMPRESSION 1.8cm irregular right breast mass within the 9:30 position 3.5cm from the nipple is highly suggestive of malignancy. An Ultrasound guided breast biopsy is recommended. The findings of the study and recommendation for biopsy were discussed with the patient directly.    06/01/2019 Cancer Staging   Staging form: Breast, AJCC 8th Edition - Clinical stage from 06/01/2019: Stage IA (cT1c, cN0, cM0, G3, ER+, PR-, HER2+) - Signed by Truitt Merle, MD on 06/17/2019   06/01/2019 Initial Biopsy   Breast Biopsy - Right Breast Core Bx DIAGNOSIS INFILTRATING DUCT CARCINOMA WITH MINOR DUCTAL CARCINOMA IN SITU COMPNENT OF RIGHT BREAST, CORE NEEDLE BIOPSY    06/01/2019 Receptors her2   ER: greater than 75% of tumor cells show moderate staining  PR: Negative  HER2: Positive 3+ Ki67: 40%   06/17/2019 Initial Diagnosis   Malignant neoplasm of upper-outer quadrant of right breast in female, estrogen receptor positive ([place])   06/30/2019 Surgery   RIGHT BREAST LUMPECTOMY WITH RADIOACTIVE SEED AND RIGHT AXILLARY SENTINEL NODE BIOPSY and PAC placement  by Dr Donne Hazel     06/30/2019 Pathology Results   FINAL MICROSCOPIC DIAGNOSIS:   A. BREAST, RIGHT, LUMPECTOMY:  - Invasive ductal carcinoma, grade 3, spanning 1.5 cm.  - Intermediate grade ductal carcinoma in situ.  - Invasive carcinoma is 0.1-0.2 from the final lateral margin (part E).  - Margins are negative for in situ carcinoma.  - Biopsy site.  - See oncology table.   B. LYMPH NODE,  RIGHT AXILLARY, SENTINEL, BIOPSY:  - One of one lymph nodes negative for carcinoma (0/1).   C. LYMPH NODE, RIGHT AXILLARY, SENTINEL, BIOPSY:  - One of one lymph nodes negative for carcinoma (0/1).   D. LYMPH NODE, RIGHT AXILLARY, SENTINEL, BIOPSY:  - One of one lymph nodes negative for carcinoma (0/1).   E. BREAST, RIGHT ADDITIONAL LATERAL MARGIN, EXCISION:  - Invasive ductal carcinoma.  - Invasive carcinoma is 0.1-0.2 cm from the new lateral margin.   F. BREAST, RIGHT ADDITIONAL POSTERIOR MARGIN, EXCISION:  - Fibrocystic change and usual ductal hyperplasia.  - No malignancy identified.   G. BREAST, RIGHT ADDITIONAL SUPERIOR MARGIN, EXCISION:  - Fibrocystic change and usual ductal hyperplasia.  - Incidental radial scar.  - No malignancy identified.     PROGNOSTIC INDICATOR RESULTS:  The tumor cells are POSITIVE for Her2 (3+).  Estrogen Receptor:       POSITIVE, 85%, MODERATE STAINING  Progesterone Receptor:   NEGATIVE  Proliferation Marker Ki-67:   25%    06/30/2019 Cancer Staging   Staging form: Breast, AJCC 8th Edition - Pathologic stage from 06/30/2019: Stage IA (pT1c, pN0, cM0, G3, ER+, PR-, HER2+) - Signed by Truitt Merle, MD on 07/15/2019   07/29/2019 - 07/12/2020 Chemotherapy   Adjuvant Weekly Abraxane (Due to her insurance denial of Abraxane, I changed it to paclitaxel starting 09/15/19) and Transtuzumab for 12 weeks starting 07/29/19-10/12/19, followed by maintenance trastuzumab  q3weeks starting 11/03/19 to complete 1 year of treatment. Due to headaches her trastuzumab ([place]) was switched to trastuzumab Calla Kicks) on 09/08/19.     11/24/2019 -  Anti-estrogen oral therapy   Tamoxifen   12/2019 - 01/2020 Radiation Therapy   Outside Radiation in late 2021.       INTERVAL HISTORY:  Abigail Berg is here for a follow up of breast cancer. She was last seen by me on 02/28/21. She presents to the clinic accompanied by her sister. She reports intermittent swelling of her neck. She  tells me she is still smoking. She notes she is no longer taking gabapentin.   All other systems were reviewed with the patient and are negative.  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   Personal history of colonic polyps    Pneumonia    S/P partial colectomy 2017    SURGICAL HISTORY: Past Surgical History:  Procedure Laterality Date   BREAST LUMPECTOMY Right 06/30/2019   right breast lumpectomy IDC w/ rad w/chemo   BREAST LUMPECTOMY WITH RADIOACTIVE SEED AND SENTINEL LYMPH NODE BIOPSY Right 06/30/2019   Procedure: RIGHT BREAST LUMPECTOMY WITH RADIOACTIVE SEED AND RIGHT AXILLARY SENTINEL NODE BIOPSY;  Surgeon: Rolm Bookbinder, MD;  Location: [place];  Service: General;  Laterality: Right;  PEC BLOCK   BREAST SURGERY     COLON SURGERY  2017   Partial colectomy   COLONOSCOPY     PARATHYROIDECTOMY Left 01/25/2020   Procedure: LEFT INFERIOR PARATHYROIDECTOMY;  Surgeon: Armandina Gemma, MD;  Location: [facility];  Service: General;  Laterality: Left;   POLYPECTOMY     PORT-A-CATH REMOVAL Left 10/24/2020   Procedure: REMOVAL PORT-A-CATH;  Surgeon: Rolm Bookbinder, MD;  Location: [place];  Service: General;  Laterality: Left;   PORTACATH PLACEMENT Left 06/30/2019   Procedure: INSERTION PORT-A-CATH WITH ULTRASOUND GUIDANCE;  Surgeon: Rolm Bookbinder, MD;  Location: [place];  Service: General;  Laterality: Left;   VENTRAL HERNIA REPAIR N/A 01/25/2020   Procedure:  HERNIA REPAIR VENTRAL ADULT WITH MESH;  Surgeon: Armandina Gemma, MD;  Location: [facility];  Service: General;  Laterality: N/A;    I have reviewed the social history and family history with the patient and they are unchanged from previous note.  ALLERGIES:  has No Known Allergies.  MEDICATIONS:  Current Outpatient Medications  Medication Sig Dispense Refill   CALCIUM PO Take by mouth.     Cholecalciferol (VITAMIN D) 50 MCG (2000 UT) tablet Take 2,000 Units by mouth daily.     escitalopram (LEXAPRO) 20 MG tablet TAKE 1 TABLET(20 MG) BY MOUTH DAILY 90 tablet 1   gabapentin (NEURONTIN) 100 MG capsule Take 1 capsule (100 mg total) by mouth 2 (two) times daily. 60 capsule 0   ibuprofen (ADVIL) 200 MG tablet Take 600 mg by mouth every 6 (six) hours as needed for headache or mild pain.     tamoxifen (NOLVADEX) 20  MG tablet Take 1 tablet (20 mg total) by mouth daily. 90 tablet 1   traZODone (DESYREL) 100 MG tablet TAKE 1 TABLET(100 MG) BY MOUTH AT BEDTIME AS NEEDED FOR SLEEP 90 tablet 1   Zoledronic Acid (ZOMETA IV) Inject into the vein. INFUSION EVERY 6 MONTHS FOR BONES AFTER CHEMO FOR BREAST CANCER     No current facility-administered medications for this visit.    PHYSICAL EXAMINATION: ECOG PERFORMANCE STATUS: 0 - Asymptomatic  Vitals:   08/29/21 1357  BP: 132/74  Pulse: 68  Resp: 18  Temp: 98.4 F (36.9 C)  SpO2: 100%   Wt Readings from Last 3 Encounters:  08/29/21 177 lb 8 oz (80.5 kg)  02/28/21 183 lb 9.6 oz (83.3 kg)  11/22/20 191 lb 1.6 oz (86.7 kg)     GENERAL:alert, no distress and comfortable SKIN: skin color, texture, turgor are normal, no rashes or significant lesions EYES: normal, Conjunctiva are pink and non-injected, sclera clear  NECK: supple, thyroid normal size, (+) mild tenderness LYMPH:  no palpable lymphadenopathy in the cervical, axillary LUNGS: clear to auscultation and percussion with normal breathing effort HEART: regular rate & rhythm and no murmurs and no  lower extremity edema ABDOMEN:abdomen soft, non-tender and normal bowel sounds Musculoskeletal:no cyanosis of digits and no clubbing  NEURO: alert & oriented x 3 with fluent speech, no focal motor/sensory deficits BREAST: No palpable mass, nodules or adenopathy bilaterally. Breast exam benign.   LABORATORY DATA:  I have reviewed the data as listed    Latest Ref Rng & Units 08/29/2021    1:16 PM 02/28/2021    1:59 PM 11/22/2020   12:50 PM  CBC  WBC 4.0 - 10.5 K/uL 6.1  6.2  5.5   Hemoglobin 12.0 - 15.0 g/dL 15.0  14.7  14.7   Hematocrit 36.0 - 46.0 % 42.3  42.5  42.3   Platelets 150 - 400 K/uL 187  224  245         Latest Ref Rng & Units 08/29/2021    1:16 PM 02/28/2021    1:59 PM 11/22/2020   12:50 PM  CMP  Glucose 70 - 99 mg/dL 123  91  130   BUN 6 - 20 mg/dL _0 Creatinine 0.44 - 1.00 mg/dL 0.93  0.96  1.04   Sodium 135 - 145 mmol/L 137  139  138   Potassium 3.5 - 5.1 mmol/L 3.8  3.6  3.7   Chloride 98 - 111 mmol/L 104  106  105   CO2 22 - 32 mmol/L _1 Calcium 8.9 - 10.3 mg/dL 9.6  9.2  9.5   Total Protein 6.5 - 8.1 g/dL 6.9  7.0  7.1   Total Bilirubin 0.3 - 1.2 mg/dL 0.4  0.4  0.4   Alkaline Phos 38 - 126 U/L 36  39  42   AST 15 - 41 U/L _2 ALT 0 - 44 U/L _3 RADIOGRAPHIC STUDIES: I have personally reviewed the radiological images as listed and agreed with the findings in the report. No results found.    Orders Placed This Encounter  Procedures   MM Digital Screening    Standing Status:   Future    Standing Expiration Date:   08/29/2022    Order Specific Question:   Reason for Exam (SYMPTOM  OR DIAGNOSIS REQUIRED)  Answer:   screening    Order Specific Question:   Is the patient pregnant?    Answer:   No    Order Specific Question:   Preferred imaging location?    Answer:   [facility]   All questions were answered. The patient knows to call the clinic with any problems, questions or concerns. No barriers to  learning was detected. The total time spent in the appointment was 30 minutes.     Truitt Merle, MD 08/29/2021   I, Wilburn Mylar, am acting as scribe for Truitt Merle, MD.   I have reviewed the above documentation for accuracy and completeness, and I agree with the above.

## 2021-08-29 NOTE — Patient Instructions (Signed)

## 2021-08-31 ENCOUNTER — Other Ambulatory Visit: Payer: Self-pay | Admitting: Hematology

## 2021-09-03 ENCOUNTER — Telehealth: Payer: Self-pay | Admitting: Hematology

## 2021-09-03 NOTE — Telephone Encounter (Signed)
Scheduled follow-up appointment per 7/27 los. Patient is aware. 

## 2021-09-04 ENCOUNTER — Other Ambulatory Visit: Payer: Self-pay

## 2021-09-05 ENCOUNTER — Other Ambulatory Visit: Payer: Self-pay | Admitting: Hematology

## 2021-09-12 ENCOUNTER — Other Ambulatory Visit (HOSPITAL_COMMUNITY)
Admission: RE | Admit: 2021-09-12 | Discharge: 2021-09-12 | Disposition: A | Discharge status: Home / Self Care | Payer: BC Managed Care – PPO | Source: Ambulatory Visit | Attending: Medical | Admitting: Medical

## 2021-09-12 ENCOUNTER — Ambulatory Visit (INDEPENDENT_AMBULATORY_CARE_PROVIDER_SITE_OTHER): Payer: BC Managed Care – PPO | Admitting: Family Medicine

## 2021-09-12 ENCOUNTER — Encounter: Payer: Self-pay | Admitting: Family Medicine

## 2021-09-12 VITALS — BP 122/79 | HR 75 | Ht 69.0 in | Wt 176.0 lb

## 2021-09-12 DIAGNOSIS — Z170 Estrogen receptor positive status [ER+]: Secondary | ICD-10-CM | POA: Diagnosis not present

## 2021-09-12 DIAGNOSIS — Z124 Encounter for screening for malignant neoplasm of cervix: Secondary | ICD-10-CM

## 2021-09-12 DIAGNOSIS — Z01419 Encounter for gynecological examination (general) (routine) without abnormal findings: Secondary | ICD-10-CM

## 2021-09-12 DIAGNOSIS — C50411 Malignant neoplasm of upper-outer quadrant of right female breast: Secondary | ICD-10-CM

## 2021-09-12 NOTE — Assessment & Plan Note (Signed)
On tamoxifen

## 2021-09-12 NOTE — Progress Notes (Signed)
Subjective:     Abigail Berg is a 56 y.o. female and is here for a comprehensive physical exam. The patient reports no problems. No cycle since starting chemo--last [place] 19 11/2020.  The following portions of the patient's history were reviewed and updated as appropriate: allergies, current medications, past family history, past medical history, past social history, past surgical history, and problem list.  Review of Systems Pertinent items noted in HPI and remainder of comprehensive ROS otherwise negative.   Objective:    BP 122/79   Pulse 75   Ht '5\' 9"'$  (1.753 m)   Wt 176 lb (79.8 kg)   LMP 02/04/2019 (Approximate)   BMI 25.99 kg/m  General appearance: alert, cooperative, and appears stated age Head: Normocephalic, without obvious abnormality, atraumatic Neck: no adenopathy, supple, symmetrical, trachea midline, and thyroid not enlarged, symmetric, no tenderness/mass/nodules Lungs: clear to auscultation bilaterally Breasts: normal appearance, no masses or tenderness Heart: regular rate and rhythm, S1, S2 normal, no murmur, click, rub or gallop Abdomen: soft, non-tender; bowel sounds normal; no masses,  no organomegaly Pelvic: cervix normal in appearance, external genitalia normal, no adnexal masses or tenderness, no cervical motion tenderness, uterus normal size, shape, and consistency, and vagina normal without discharge Extremities: extremities normal, atraumatic, no cyanosis or edema Pulses: 2+ and symmetric Skin: Skin color, texture, turgor normal. No rashes or lesions, sun damage Lymph nodes: Cervical, supraclavicular, and axillary nodes normal. Neurologic: Grossly normal    Assessment:    GYN female exam.      Plan:   Problem List Items Addressed This Visit       Unprioritized   Malignant neoplasm of upper-outer quadrant of right breast in female, estrogen receptor positive ([place])    On tamoxifen      Other Visit Diagnoses     Screening for malignant neoplasm of  cervix    -  Primary   Relevant Orders   Cytology - PAP( [place])   Encounter for gynecological examination without abnormal finding       mammogram scheduled in October      Return in 1 year (on 09/13/2022).    See After Visit Summary for Counseling Recommendations

## 2021-09-16 LAB — CYTOLOGY - PAP
Comment: NEGATIVE
Diagnosis: NEGATIVE
High risk HPV: NEGATIVE

## 2021-09-25 ENCOUNTER — Other Ambulatory Visit: Payer: Self-pay | Admitting: Hematology

## 2021-09-25 DIAGNOSIS — Z9889 Other specified postprocedural states: Secondary | ICD-10-CM

## 2021-10-02 ENCOUNTER — Other Ambulatory Visit: Payer: Self-pay

## 2021-10-04 ENCOUNTER — Other Ambulatory Visit: Payer: Self-pay

## 2021-10-18 DIAGNOSIS — J069 Acute upper respiratory infection, unspecified: Secondary | ICD-10-CM | POA: Diagnosis not present

## 2021-10-21 ENCOUNTER — Emergency Department (HOSPITAL_COMMUNITY): Payer: BC Managed Care – PPO

## 2021-10-21 ENCOUNTER — Encounter (HOSPITAL_COMMUNITY): Payer: Self-pay | Admitting: Emergency Medicine

## 2021-10-21 ENCOUNTER — Emergency Department (HOSPITAL_COMMUNITY)
Admission: EM | Admit: 2021-10-21 | Discharge: 2021-10-21 | Disposition: A | Discharge status: Home / Self Care | Payer: BC Managed Care – PPO | Attending: Emergency Medicine | Admitting: Emergency Medicine

## 2021-10-21 DIAGNOSIS — Z853 Personal history of malignant neoplasm of breast: Secondary | ICD-10-CM | POA: Insufficient documentation

## 2021-10-21 DIAGNOSIS — R9431 Abnormal electrocardiogram [ECG] [EKG]: Secondary | ICD-10-CM | POA: Diagnosis not present

## 2021-10-21 DIAGNOSIS — J069 Acute upper respiratory infection, unspecified: Secondary | ICD-10-CM | POA: Insufficient documentation

## 2021-10-21 DIAGNOSIS — R509 Fever, unspecified: Secondary | ICD-10-CM | POA: Diagnosis not present

## 2021-10-21 DIAGNOSIS — J22 Unspecified acute lower respiratory infection: Secondary | ICD-10-CM

## 2021-10-21 DIAGNOSIS — J449 Chronic obstructive pulmonary disease, unspecified: Secondary | ICD-10-CM | POA: Diagnosis not present

## 2021-10-21 LAB — CBC WITH DIFFERENTIAL/PLATELET
Abs Immature Granulocytes: 0.02 10*3/uL (ref 0.00–0.07)
Basophils Absolute: 0 10*3/uL (ref 0.0–0.1)
Basophils Relative: 0 %
Eosinophils Absolute: 0.2 10*3/uL (ref 0.0–0.5)
Eosinophils Relative: 2 %
HCT: 41.9 % (ref 36.0–46.0)
Hemoglobin: 14.5 g/dL (ref 12.0–15.0)
Immature Granulocytes: 0 %
Lymphocytes Relative: 31 %
Lymphs Abs: 2.4 10*3/uL (ref 0.7–4.0)
MCH: 32 pg (ref 26.0–34.0)
MCHC: 34.6 g/dL (ref 30.0–36.0)
MCV: 92.5 fL (ref 80.0–100.0)
Monocytes Absolute: 0.5 10*3/uL (ref 0.1–1.0)
Monocytes Relative: 7 %
Neutro Abs: 4.6 10*3/uL (ref 1.7–7.7)
Neutrophils Relative %: 60 %
Platelets: 228 10*3/uL (ref 150–400)
RBC: 4.53 MIL/uL (ref 3.87–5.11)
RDW: 12.7 % (ref 11.5–15.5)
WBC: 7.8 10*3/uL (ref 4.0–10.5)
nRBC: 0 % (ref 0.0–0.2)

## 2021-10-21 LAB — BASIC METABOLIC PANEL
Anion gap: 8 (ref 5–15)
BUN: 14 mg/dL (ref 6–20)
CO2: 26 mmol/L (ref 22–32)
Calcium: 8.8 mg/dL — ABNORMAL LOW (ref 8.9–10.3)
Chloride: 104 mmol/L (ref 98–111)
Creatinine, Ser: 0.77 mg/dL (ref 0.44–1.00)
GFR, Estimated: >60 mL/min (ref >60)
Glucose, Bld: 101 mg/dL — ABNORMAL HIGH (ref 70–99)
Potassium: 3.9 mmol/L (ref 3.5–5.1)
Sodium: 138 mmol/L (ref 135–145)

## 2021-10-21 MED ORDER — DOXYCYCLINE HYCLATE 100 MG PO TABS
100.0000 mg | ORAL_TABLET | Freq: Once | ORAL | Status: AC
  Administered 2021-10-21: 100 mg via ORAL
  Filled 2021-10-21: qty 1

## 2021-10-21 MED ORDER — DOXYCYCLINE HYCLATE 100 MG PO CAPS: 100.0000 mg | ORAL_CAPSULE | Freq: Two times a day (BID) | ORAL | 0 refills | Status: DC

## 2021-10-21 NOTE — ED Provider Triage Note (Signed)
Emergency Medicine Provider Triage Evaluation Note  Abigail Berg , a 56 y.o. female  was evaluated in triage.  Pt complains of intermittent fevers and worsening cough over the last week.  Highest fever of 100.8.  Hx of right breast upper outer quadrant malignancy.  Last CT was done in July, has been tracking the malignancy.  Has taken 2 COVID test at home, which were negative.  Denies neck stiffness, N/V/D, changes in urinary or bowel habits, abdominal pain, or chest pain.  Review of Systems  Positive:  Negative: See above  Physical Exam  BP 137/74 (BP Location: Left Arm)   Pulse 75   Temp 98.6 F (37 C) (Oral)   Resp 18   Ht '5\' 8"'$  (1.727 m)   Wt 77.1 kg   LMP 02/04/2019 (Approximate)   SpO2 100%   BMI 25.85 kg/m  Gen:   Awake, no distress   Resp:  Normal effort, mild wheezing in the left lower lobe MSK:   Moves extremities without difficulty  Other:  Chest non-TTP.  Abdomen soft, nontender  Medical Decision Making  Medically screening exam initiated at 7:58 PM.  Appropriate orders placed.  Allexus Ovens was informed that the remainder of the evaluation will be completed by another provider, this initial triage assessment does not replace that evaluation, and the importance of remaining in the ED until their evaluation is complete.     Prince Rome, PA-C 56/21/30 2000

## 2021-10-21 NOTE — ED Provider Notes (Signed)
[facility] DEPT  Provider Note  CSN: [id number] Arrival date & time: 10/21/21 1917  History Chief Complaint  Patient presents with   Fever    Ellsie Berg is a 56 y.o. female with history of breast cancer, no longer under treatment, reports about a week of fever and cough. No chest pains. Minimal sputum. Has had neg Covid x 2. She continues to smoke.    Home Medications Prior to Admission medications   Medication Sig Start Date End Date Taking? Authorizing Provider  doxycycline (VIBRAMYCIN) 100 MG capsule Take 1 capsule (100 mg total) by mouth 2 (two) times daily. 10/21/21  Yes Truddie Hidden, MD  CALCIUM PO Take by mouth.    [provider]  Cholecalciferol (VITAMIN D) 50 MCG (2000 UT) tablet Take 2,000 Units by mouth daily.    [provider]  escitalopram (LEXAPRO) 20 MG tablet TAKE 1 TABLET(20 MG) BY MOUTH DAILY 08/29/21   Truitt Merle, MD  ibuprofen (ADVIL) 200 MG tablet Take 600 mg by mouth every 6 (six) hours as needed for headache or mild pain.    [provider]  tamoxifen (NOLVADEX) 20 MG tablet TAKE 1 TABLET(20 MG) BY MOUTH DAILY 09/05/21   Truitt Merle, MD  traZODone (DESYREL) 100 MG tablet TAKE 1 TABLET(100 MG) BY MOUTH AT BEDTIME AS NEEDED FOR SLEEP 09/02/21   Truitt Merle, MD  Zoledronic Acid (ZOMETA IV) Inject into the vein. INFUSION EVERY 6 MONTHS FOR BONES AFTER CHEMO FOR BREAST CANCER    [provider]     Allergies    Patient has no known allergies.   Review of Systems   Review of Systems Please see HPI for pertinent positives and negatives  Physical Exam BP 128/71   Pulse 65   Temp 98.6 F (37 C) (Oral)   Resp 18   Ht '5\' 8"'$  (1.727 m)   Wt 77.1 kg   LMP 02/04/2019 (Approximate)   SpO2 97%   BMI 25.85 kg/m   Physical Exam Vitals and nursing note reviewed.  Constitutional:      Appearance: Normal appearance.  HENT:     Head: Normocephalic and atraumatic.     Nose: Nose normal.      Mouth/Throat:     Mouth: Mucous membranes are moist.  Eyes:     Extraocular Movements: Extraocular movements intact.     Conjunctiva/sclera: Conjunctivae normal.  Cardiovascular:     Rate and Rhythm: Normal rate.  Pulmonary:     Effort: Pulmonary effort is normal.     Breath sounds: Normal breath sounds. No wheezing or rales.  Abdominal:     General: Abdomen is flat.     Palpations: Abdomen is soft.     Tenderness: There is no abdominal tenderness.  Musculoskeletal:        General: No swelling. Normal range of motion.     Cervical back: Neck supple.  Skin:    General: Skin is warm and dry.  Neurological:     General: No focal deficit present.     Mental Status: She is alert.  Psychiatric:        Mood and Affect: Mood normal.     ED Results / Procedures / Treatments   EKG EKG Interpretation  Date/Time:  Monday October 21 2021 20:02:03 EDT Ventricular Rate:  77 PR Interval:  141 QRS Duration: 105 QT Interval:  391 QTC Calculation: 443 R Axis:   54 Text Interpretation: Sinus rhythm Low voltage, precordial leads RSR'  in V1 or V2, right VCD or RVH No old tracing to compare Confirmed by Calvert Cantor [phone]) on 10/21/2021 11:14:44 PM  Procedures Procedures  Medications Ordered in the ED Medications  doxycycline (VIBRA-TABS) tablet 100 mg (has no administration in time range)    Initial Impression and Plan  Patient well appearing with 1 week of cough and fever. CBC and BMP is normal. I personally viewed the images from radiology studies and agree with radiologist interpretation: CXR is clear. She is concerned about a small nodule seen on most recent CT, but not evident on CXR today. Given her history and duration of symptoms, reasonable to try a course of doxycycline. Recommend OTC meds for symptom relief. PCP follow up.    ED Course       MDM Rules/Calculators/A&P Medical Decision Making Problems Addressed: Lower respiratory tract infection: acute illness or  injury  Amount and/or Complexity of Data Reviewed Labs: ordered. Decision-making details documented in ED Course. Radiology: ordered and independent interpretation performed. Decision-making details documented in ED Course.  Risk Prescription drug management.    Final Clinical Impression(s) / ED Diagnoses Final diagnoses:  Lower respiratory tract infection    Rx / DC Orders ED Discharge Orders          Ordered    doxycycline (VIBRAMYCIN) 100 MG capsule  2 times daily        10/21/21 2325             Truddie Hidden, MD 10/21/21 2325

## 2021-10-21 NOTE — ED Triage Notes (Signed)
Pt reports fever, congestion, and nonproductive cough for 1 week. Reports 2 negative COVID tests (at home and [facility]). Hx of treatment at the cancer center. States that her last CT did show something on her lung. Next tx in Jan, but chemo is done.

## 2021-10-25 DIAGNOSIS — H2513 Age-related nuclear cataract, bilateral: Secondary | ICD-10-CM | POA: Diagnosis not present

## 2021-11-01 DIAGNOSIS — H2511 Age-related nuclear cataract, right eye: Secondary | ICD-10-CM | POA: Diagnosis not present

## 2021-11-29 DIAGNOSIS — H2511 Age-related nuclear cataract, right eye: Secondary | ICD-10-CM | POA: Diagnosis not present

## 2021-12-05 ENCOUNTER — Ambulatory Visit
Admission: RE | Admit: 2021-12-05 | Discharge: 2021-12-05 | Disposition: A | Discharge status: Home / Self Care | Payer: BC Managed Care – PPO | Source: Ambulatory Visit | Attending: Hematology | Admitting: Hematology

## 2021-12-05 ENCOUNTER — Other Ambulatory Visit: Payer: Self-pay | Admitting: Hematology

## 2021-12-05 DIAGNOSIS — Z9889 Other specified postprocedural states: Secondary | ICD-10-CM

## 2021-12-05 DIAGNOSIS — Z853 Personal history of malignant neoplasm of breast: Secondary | ICD-10-CM | POA: Diagnosis not present

## 2021-12-05 DIAGNOSIS — R599 Enlarged lymph nodes, unspecified: Secondary | ICD-10-CM

## 2021-12-09 ENCOUNTER — Other Ambulatory Visit: Payer: Self-pay | Admitting: Hematology

## 2021-12-17 DIAGNOSIS — H2512 Age-related nuclear cataract, left eye: Secondary | ICD-10-CM | POA: Diagnosis not present

## 2021-12-19 ENCOUNTER — Ambulatory Visit
Admission: RE | Admit: 2021-12-19 | Discharge: 2021-12-19 | Disposition: A | Discharge status: Home / Self Care | Payer: BC Managed Care – PPO | Source: Ambulatory Visit | Attending: Hematology | Admitting: Hematology

## 2021-12-19 DIAGNOSIS — R928 Other abnormal and inconclusive findings on diagnostic imaging of breast: Secondary | ICD-10-CM | POA: Diagnosis not present

## 2021-12-19 DIAGNOSIS — N6082 Other benign mammary dysplasias of left breast: Secondary | ICD-10-CM | POA: Diagnosis not present

## 2021-12-19 DIAGNOSIS — Z9889 Other specified postprocedural states: Secondary | ICD-10-CM

## 2021-12-19 DIAGNOSIS — N6489 Other specified disorders of breast: Secondary | ICD-10-CM

## 2021-12-19 DIAGNOSIS — R599 Enlarged lymph nodes, unspecified: Secondary | ICD-10-CM

## 2021-12-19 HISTORY — PX: BREAST BIOPSY: SHX20

## 2022-01-21 ENCOUNTER — Other Ambulatory Visit: Payer: Self-pay | Admitting: General Surgery

## 2022-01-21 DIAGNOSIS — N6489 Other specified disorders of breast: Secondary | ICD-10-CM | POA: Diagnosis not present

## 2022-01-22 ENCOUNTER — Other Ambulatory Visit: Payer: Self-pay | Admitting: General Surgery

## 2022-01-22 DIAGNOSIS — N6489 Other specified disorders of breast: Secondary | ICD-10-CM

## 2022-01-28 ENCOUNTER — Other Ambulatory Visit: Payer: Self-pay

## 2022-01-28 ENCOUNTER — Encounter (HOSPITAL_BASED_OUTPATIENT_CLINIC_OR_DEPARTMENT_OTHER): Payer: Self-pay | Admitting: General Surgery

## 2022-01-28 DIAGNOSIS — Z20818 Contact with and (suspected) exposure to other bacterial communicable diseases: Secondary | ICD-10-CM | POA: Diagnosis not present

## 2022-01-28 DIAGNOSIS — F17210 Nicotine dependence, cigarettes, uncomplicated: Secondary | ICD-10-CM | POA: Diagnosis not present

## 2022-01-28 DIAGNOSIS — J029 Acute pharyngitis, unspecified: Secondary | ICD-10-CM | POA: Diagnosis not present

## 2022-01-31 DIAGNOSIS — Z961 Presence of intraocular lens: Secondary | ICD-10-CM | POA: Diagnosis not present

## 2022-02-05 ENCOUNTER — Ambulatory Visit
Admission: RE | Admit: 2022-02-05 | Discharge: 2022-02-05 | Disposition: A | Discharge status: Home / Self Care | Payer: BC Managed Care – PPO | Source: Ambulatory Visit | Attending: General Surgery | Admitting: General Surgery

## 2022-02-05 DIAGNOSIS — R928 Other abnormal and inconclusive findings on diagnostic imaging of breast: Secondary | ICD-10-CM | POA: Diagnosis not present

## 2022-02-05 DIAGNOSIS — N6489 Other specified disorders of breast: Secondary | ICD-10-CM

## 2022-02-05 HISTORY — PX: BREAST BIOPSY: SHX20

## 2022-02-05 NOTE — Progress Notes (Signed)
Sent message reminding pt to come and pick up Ensure drink and a soap.

## 2022-02-05 NOTE — Progress Notes (Signed)
  CHG Soap given with instructions patient verbalized understanding      Patient Instructions  The night before surgery:  No food after midnight. ONLY clear liquids after midnight  The day of surgery (if you do NOT have diabetes):  Drink ONE (1) Pre-Surgery Clear Ensure as directed.   This drink was given to you during your hospital  pre-op appointment visit. The pre-op nurse will instruct you on the time to drink the  Pre-Surgery Ensure depending on your surgery time. Finish the drink at the designated time by the pre-op nurse.  Nothing else to drink after completing the  Pre-Surgery Clear Ensure.  The day of surgery (if you have diabetes): Drink ONE (1) Gatorade 2 (G2) as directed. This drink was given to you during your hospital  pre-op appointment visit.  The pre-op nurse will instruct you on the time to drink the   Gatorade 2 (G2) depending on your surgery time. Color of the Gatorade may vary. Red is not allowed. Nothing else to drink after completing the  Gatorade 2 (G2).         If you have questions, please contact your surgeon's office.

## 2022-02-06 ENCOUNTER — Encounter (HOSPITAL_BASED_OUTPATIENT_CLINIC_OR_DEPARTMENT_OTHER): Payer: Self-pay | Admitting: General Surgery

## 2022-02-06 ENCOUNTER — Ambulatory Visit (HOSPITAL_BASED_OUTPATIENT_CLINIC_OR_DEPARTMENT_OTHER): Payer: BC Managed Care – PPO | Admitting: Anesthesiology

## 2022-02-06 ENCOUNTER — Ambulatory Visit (HOSPITAL_BASED_OUTPATIENT_CLINIC_OR_DEPARTMENT_OTHER)
Admission: RE | Admit: 2022-02-06 | Discharge: 2022-02-06 | Disposition: A | Discharge status: Home / Self Care | Payer: BC Managed Care – PPO | Attending: General Surgery | Admitting: General Surgery

## 2022-02-06 ENCOUNTER — Other Ambulatory Visit: Payer: Self-pay

## 2022-02-06 ENCOUNTER — Ambulatory Visit
Admission: RE | Admit: 2022-02-06 | Discharge: 2022-02-06 | Disposition: A | Discharge status: Home / Self Care | Payer: BC Managed Care – PPO | Source: Ambulatory Visit | Attending: General Surgery | Admitting: General Surgery

## 2022-02-06 ENCOUNTER — Encounter (HOSPITAL_BASED_OUTPATIENT_CLINIC_OR_DEPARTMENT_OTHER)
Admission: RE | Disposition: A | Discharge status: Home / Self Care | Payer: Self-pay | Source: Home / Self Care | Attending: General Surgery

## 2022-02-06 DIAGNOSIS — E210 Primary hyperparathyroidism: Secondary | ICD-10-CM | POA: Diagnosis not present

## 2022-02-06 DIAGNOSIS — Z9221 Personal history of antineoplastic chemotherapy: Secondary | ICD-10-CM | POA: Insufficient documentation

## 2022-02-06 DIAGNOSIS — Z79810 Long term (current) use of selective estrogen receptor modulators (SERMs): Secondary | ICD-10-CM | POA: Insufficient documentation

## 2022-02-06 DIAGNOSIS — N6489 Other specified disorders of breast: Secondary | ICD-10-CM

## 2022-02-06 DIAGNOSIS — F17210 Nicotine dependence, cigarettes, uncomplicated: Secondary | ICD-10-CM | POA: Diagnosis not present

## 2022-02-06 DIAGNOSIS — F32A Depression, unspecified: Secondary | ICD-10-CM | POA: Insufficient documentation

## 2022-02-06 DIAGNOSIS — N6012 Diffuse cystic mastopathy of left breast: Secondary | ICD-10-CM | POA: Diagnosis not present

## 2022-02-06 DIAGNOSIS — Z923 Personal history of irradiation: Secondary | ICD-10-CM | POA: Insufficient documentation

## 2022-02-06 DIAGNOSIS — N6320 Unspecified lump in the left breast, unspecified quadrant: Secondary | ICD-10-CM | POA: Diagnosis not present

## 2022-02-06 DIAGNOSIS — N62 Hypertrophy of breast: Secondary | ICD-10-CM | POA: Diagnosis not present

## 2022-02-06 DIAGNOSIS — Z1501 Genetic susceptibility to malignant neoplasm of breast: Secondary | ICD-10-CM | POA: Insufficient documentation

## 2022-02-06 DIAGNOSIS — R928 Other abnormal and inconclusive findings on diagnostic imaging of breast: Secondary | ICD-10-CM | POA: Diagnosis not present

## 2022-02-06 DIAGNOSIS — G709 Myoneural disorder, unspecified: Secondary | ICD-10-CM | POA: Insufficient documentation

## 2022-02-06 DIAGNOSIS — Z853 Personal history of malignant neoplasm of breast: Secondary | ICD-10-CM | POA: Insufficient documentation

## 2022-02-06 HISTORY — PX: RADIOACTIVE SEED GUIDED EXCISIONAL BREAST BIOPSY: SHX6490

## 2022-02-06 SURGERY — RADIOACTIVE SEED GUIDED BREAST BIOPSY
Anesthesia: General | Site: Breast | Laterality: Left

## 2022-02-06 MED ORDER — AMISULPRIDE (ANTIEMETIC) 5 MG/2ML IV SOLN: 10.0000 mg | Freq: Once | INTRAVENOUS | Status: DC | PRN

## 2022-02-06 MED ORDER — PROPOFOL 10 MG/ML IV BOLUS
INTRAVENOUS | Status: DC | PRN
  Administered 2022-02-06: 100 mg via INTRAVENOUS

## 2022-02-06 MED ORDER — FENTANYL CITRATE (PF) 100 MCG/2ML IJ SOLN
INTRAMUSCULAR | Status: AC
  Filled 2022-02-06: qty 2

## 2022-02-06 MED ORDER — LIDOCAINE 2% (20 MG/ML) 5 ML SYRINGE
INTRAMUSCULAR | Status: AC
  Filled 2022-02-06: qty 5

## 2022-02-06 MED ORDER — ONDANSETRON HCL 4 MG/2ML IJ SOLN
INTRAMUSCULAR | Status: DC | PRN
  Administered 2022-02-06: 4 mg via INTRAVENOUS

## 2022-02-06 MED ORDER — HYDROMORPHONE HCL 1 MG/ML IJ SOLN
0.2500 mg | INTRAMUSCULAR | Status: DC | PRN
  Administered 2022-02-06: 0.5 mg via INTRAVENOUS

## 2022-02-06 MED ORDER — CEFAZOLIN SODIUM-DEXTROSE 2-4 GM/100ML-% IV SOLN
2.0000 g | INTRAVENOUS | Status: AC
  Administered 2022-02-06: 2 g via INTRAVENOUS

## 2022-02-06 MED ORDER — MIDAZOLAM HCL 5 MG/5ML IJ SOLN
INTRAMUSCULAR | Status: DC | PRN
  Administered 2022-02-06: 2 mg via INTRAVENOUS

## 2022-02-06 MED ORDER — PHENYLEPHRINE 80 MCG/ML (10ML) SYRINGE FOR IV PUSH (FOR BLOOD PRESSURE SUPPORT)
PREFILLED_SYRINGE | INTRAVENOUS | Status: AC
  Filled 2022-02-06: qty 10

## 2022-02-06 MED ORDER — LACTATED RINGERS IV SOLN: INTRAVENOUS | Status: DC | PRN

## 2022-02-06 MED ORDER — CEFAZOLIN SODIUM-DEXTROSE 2-4 GM/100ML-% IV SOLN
INTRAVENOUS | Status: AC
  Filled 2022-02-06: qty 100

## 2022-02-06 MED ORDER — LIDOCAINE HCL (CARDIAC) PF 100 MG/5ML IV SOSY
PREFILLED_SYRINGE | INTRAVENOUS | Status: DC | PRN
  Administered 2022-02-06: 60 mg via INTRAVENOUS

## 2022-02-06 MED ORDER — ONDANSETRON HCL 4 MG/2ML IJ SOLN
INTRAMUSCULAR | Status: AC
  Filled 2022-02-06: qty 2

## 2022-02-06 MED ORDER — PROMETHAZINE HCL 25 MG/ML IJ SOLN: 6.2500 mg | INTRAMUSCULAR | Status: DC | PRN

## 2022-02-06 MED ORDER — HYDROMORPHONE HCL 1 MG/ML IJ SOLN
INTRAMUSCULAR | Status: AC
  Filled 2022-02-06: qty 0.5

## 2022-02-06 MED ORDER — FENTANYL CITRATE (PF) 100 MCG/2ML IJ SOLN
INTRAMUSCULAR | Status: DC | PRN
  Administered 2022-02-06 (×2): 50 ug via INTRAVENOUS

## 2022-02-06 MED ORDER — PROPOFOL 10 MG/ML IV BOLUS
INTRAVENOUS | Status: AC
  Filled 2022-02-06: qty 20

## 2022-02-06 MED ORDER — OXYCODONE HCL 5 MG/5ML PO SOLN: 5.0000 mg | Freq: Once | ORAL | Status: AC | PRN

## 2022-02-06 MED ORDER — BUPIVACAINE HCL (PF) 0.25 % IJ SOLN
INTRAMUSCULAR | Status: DC | PRN
  Administered 2022-02-06: 2 mL

## 2022-02-06 MED ORDER — OXYCODONE HCL 5 MG PO TABS
5.0000 mg | ORAL_TABLET | Freq: Once | ORAL | Status: AC | PRN
  Administered 2022-02-06: 5 mg via ORAL

## 2022-02-06 MED ORDER — ACETAMINOPHEN 500 MG PO TABS
1000.0000 mg | ORAL_TABLET | ORAL | Status: AC
  Administered 2022-02-06: 1000 mg via ORAL

## 2022-02-06 MED ORDER — ENSURE PRE-SURGERY PO LIQD: 296.0000 mL | Freq: Once | ORAL | Status: DC

## 2022-02-06 MED ORDER — CHLORHEXIDINE GLUCONATE CLOTH 2 % EX PADS: 6.0000 | MEDICATED_PAD | Freq: Once | CUTANEOUS | Status: DC

## 2022-02-06 MED ORDER — DEXAMETHASONE SODIUM PHOSPHATE 4 MG/ML IJ SOLN
INTRAMUSCULAR | Status: DC | PRN
  Administered 2022-02-06: 5 mg via INTRAVENOUS

## 2022-02-06 MED ORDER — OXYCODONE HCL 5 MG PO TABS
ORAL_TABLET | ORAL | Status: AC
  Filled 2022-02-06: qty 1

## 2022-02-06 MED ORDER — ACETAMINOPHEN 500 MG PO TABS
ORAL_TABLET | ORAL | Status: AC
  Filled 2022-02-06: qty 2

## 2022-02-06 MED ORDER — MIDAZOLAM HCL 2 MG/2ML IJ SOLN
INTRAMUSCULAR | Status: AC
  Filled 2022-02-06: qty 2

## 2022-02-06 MED ORDER — DEXAMETHASONE SODIUM PHOSPHATE 10 MG/ML IJ SOLN
INTRAMUSCULAR | Status: AC
  Filled 2022-02-06: qty 1

## 2022-02-06 MED ORDER — MEPERIDINE HCL 25 MG/ML IJ SOLN: 6.2500 mg | INTRAMUSCULAR | Status: DC | PRN

## 2022-02-06 SURGICAL SUPPLY — 57 items
ADH SKN CLS APL DERMABOND .7 (GAUZE/BANDAGES/DRESSINGS) ×1
APL PRP STRL LF DISP 70% ISPRP (MISCELLANEOUS) ×1
APPLIER CLIP 9.375 MED OPEN (MISCELLANEOUS)
APR CLP MED 9.3 20 MLT OPN (MISCELLANEOUS)
BINDER BREAST LRG (GAUZE/BANDAGES/DRESSINGS) IMPLANT
BINDER BREAST MEDIUM (GAUZE/BANDAGES/DRESSINGS) IMPLANT
BINDER BREAST XLRG (GAUZE/BANDAGES/DRESSINGS) IMPLANT
BINDER BREAST XXLRG (GAUZE/BANDAGES/DRESSINGS) IMPLANT
BLADE SURG 15 STRL LF DISP TIS (BLADE) ×1 IMPLANT
BLADE SURG 15 STRL SS (BLADE) ×1
CANISTER SUC SOCK COL 7IN (MISCELLANEOUS) IMPLANT
CANISTER SUCT 1200ML W/VALVE (MISCELLANEOUS) IMPLANT
CHLORAPREP W/TINT 26 (MISCELLANEOUS) ×1 IMPLANT
CLIP APPLIE 9.375 MED OPEN (MISCELLANEOUS) IMPLANT
CLIP TI WIDE RED SMALL 6 (CLIP) IMPLANT
COVER BACK TABLE 60X90IN (DRAPES) ×1 IMPLANT
COVER MAYO STAND STRL (DRAPES) ×1 IMPLANT
COVER PROBE CYLINDRICAL 5X96 (MISCELLANEOUS) ×1 IMPLANT
DERMABOND ADVANCED .7 DNX12 (GAUZE/BANDAGES/DRESSINGS) ×1 IMPLANT
DRAPE LAPAROSCOPIC ABDOMINAL (DRAPES) ×1 IMPLANT
DRAPE UTILITY XL STRL (DRAPES) ×1 IMPLANT
DRSG TEGADERM 4X4.75 (GAUZE/BANDAGES/DRESSINGS) IMPLANT
ELECT COATED BLADE 2.86 ST (ELECTRODE) ×1 IMPLANT
ELECT REM PT RETURN 9FT ADLT (ELECTROSURGICAL) ×1
ELECTRODE REM PT RTRN 9FT ADLT (ELECTROSURGICAL) ×1 IMPLANT
GAUZE SPONGE 4X4 12PLY STRL LF (GAUZE/BANDAGES/DRESSINGS) IMPLANT
GLOVE BIO SURGEON STRL SZ7 (GLOVE) ×2 IMPLANT
GLOVE BIOGEL PI IND STRL 6.5 (GLOVE) IMPLANT
GLOVE BIOGEL PI IND STRL 7.5 (GLOVE) ×1 IMPLANT
GLOVE SURG SS PI 6.5 STRL IVOR (GLOVE) IMPLANT
GLOVE SURG SS PI 7.0 STRL IVOR (GLOVE) IMPLANT
GOWN STRL REUS W/ TWL LRG LVL3 (GOWN DISPOSABLE) ×2 IMPLANT
GOWN STRL REUS W/TWL LRG LVL3 (GOWN DISPOSABLE) ×3
HEMOSTAT ARISTA ABSORB 3G PWDR (HEMOSTASIS) IMPLANT
KIT MARKER MARGIN INK (KITS) ×1 IMPLANT
NDL HYPO 25X1 1.5 SAFETY (NEEDLE) ×1 IMPLANT
NEEDLE HYPO 25X1 1.5 SAFETY (NEEDLE) ×1 IMPLANT
NS IRRIG 1000ML POUR BTL (IV SOLUTION) IMPLANT
PACK BASIN DAY SURGERY FS (CUSTOM PROCEDURE TRAY) ×1 IMPLANT
PENCIL SMOKE EVACUATOR (MISCELLANEOUS) ×1 IMPLANT
RETRACTOR ONETRAX LX 90X20 (MISCELLANEOUS) IMPLANT
SLEEVE SCD COMPRESS KNEE MED (STOCKING) ×1 IMPLANT
SPIKE FLUID TRANSFER (MISCELLANEOUS) IMPLANT
SPONGE T-LAP 4X18 ~~LOC~~+RFID (SPONGE) ×1 IMPLANT
STRIP CLOSURE SKIN 1/2X4 (GAUZE/BANDAGES/DRESSINGS) ×1 IMPLANT
SUT MNCRL AB 4-0 PS2 18 (SUTURE) IMPLANT
SUT MON AB 5-0 PS2 18 (SUTURE) IMPLANT
SUT SILK 2 0 SH (SUTURE) IMPLANT
SUT VIC AB 2-0 SH 27 (SUTURE) ×1
SUT VIC AB 2-0 SH 27XBRD (SUTURE) ×1 IMPLANT
SUT VIC AB 3-0 SH 27 (SUTURE) ×1
SUT VIC AB 3-0 SH 27X BRD (SUTURE) ×1 IMPLANT
SYR CONTROL 10ML LL (SYRINGE) ×1 IMPLANT
TOWEL GREEN STERILE FF (TOWEL DISPOSABLE) ×1 IMPLANT
TRAY FAXITRON CT DISP (TRAY / TRAY PROCEDURE) ×1 IMPLANT
TUBE CONNECTING 20X1/4 (TUBING) IMPLANT
YANKAUER SUCT BULB TIP NO VENT (SUCTIONS) IMPLANT

## 2022-02-06 NOTE — H&P (Signed)
   57 year old female who I know from taking care of of breast cancer in 2021. She is CHEK2 positive. She underwent a right breast lumpectomy and sentinel node biopsy for a T1 cN0 M0 breast cancer. This was ER positive, PR negative, HER2 positive. She then underwent chemotherapy with trastuzumab. This was followed by radiotherapy. She started tamoxifen in October 2021 and is tolerating that just fine. She has been undergoing screening MRIs and mammograms due to her mutation. She has no symptoms related to either breast. Her latest mammogram showed her to have C density breast tissue. She has a subtle questionable area of distortion in the lower outer left breast as well as cortical thickening of the lymph node in the left axilla. She underwent a biopsy of the left breast lesion and this shows a complex sclerosing lesion with otherwise negative for malignancy. This was deemed to be discordant with excision recommended by radiology. She is here to see me today.  Review of Systems: A complete review of systems was obtained from the patient. I have reviewed this information and discussed as appropriate with the patient. See HPI as well for other ROS.  Review of Systems All other systems reviewed and are negative.   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  Family History Problem Relation Age of Onset High blood pressure (Hypertension) Mother Coronary Artery Disease (Blocked arteries around heart) Father   Social History  Tobacco Use Smoking Status Every Day Packs/day: .5 Types: Cigarettes Smokeless Tobacco Never   Social History  Socioeconomic History Marital status: Married Tobacco Use Smoking status: Every Day Packs/day: .5 Types: Cigarettes Smokeless tobacco: Never Substance and Sexual Activity Alcohol use: Not Currently Drug use: Never  Objective:  Vitals: 01/21/22 1518 BP: 124/79 Pulse: 108 Temp: 36.7 C (98 F) SpO2: 96% Weight: 83 kg (183 lb) Height: 175.3 cm (_0 )  Body mass index is 27.02 kg/m.  Physical Exam Vitals reviewed. Constitutional: Appearance: Normal appearance. Chest: Breasts: Right: No inverted nipple, mass or nipple discharge. Left: No inverted nipple, mass or nipple discharge. Lymphadenopathy: Upper Body: Right upper body: No supraclavicular or axillary adenopathy. Left upper body: No supraclavicular or axillary adenopathy. Neurological: Mental Status: She is alert.    Assessment and Plan:  Diagnoses and all orders for this visit:  Radial scar of breast  Left breast seed guided excisional biopsy  This area has been deemed discordant and with her history I think it is most reasonable to do a seed guided excisional biopsy. She is agreeable to that. We discussed risks and recovery and we will plan on doing this likely in January.

## 2022-02-06 NOTE — Anesthesia Postprocedure Evaluation (Signed)
Anesthesia Post Note  Patient: Natalea Sutliff Bergeron  Procedure(s) Performed: RADIOACTIVE SEED GUIDED EXCISIONAL LEFT BREAST BIOPSY (Left: Breast)     Patient location during evaluation: PACU Anesthesia Type: General Level of consciousness: awake and alert Pain management: pain level controlled Vital Signs Assessment: post-procedure vital signs reviewed and stable Respiratory status: spontaneous breathing, nonlabored ventilation and respiratory function stable Cardiovascular status: blood pressure returned to baseline and stable Postop Assessment: no apparent nausea or vomiting Anesthetic complications: no   No notable events documented.  Last Vitals:  Vitals:   02/06/22 1645 02/06/22 1715  BP: (!) 140/83 (!) 151/81  Pulse: 64 63  Resp: 16 16  Temp:    SpO2: 97% 96%    Last Pain:  Vitals:   02/06/22 1715  TempSrc:   PainSc: Clayton Rylan Kaufmann

## 2022-02-06 NOTE — Anesthesia Procedure Notes (Signed)
Procedure Name: LMA Insertion Date/Time: 02/06/2022 3:35 PM  Performed by: Willa Frater, CRNAPre-anesthesia Checklist: Patient identified, Emergency Drugs available, Suction available and Patient being monitored Patient Re-evaluated:Patient Re-evaluated prior to induction Oxygen Delivery Method: Circle system utilized Preoxygenation: Pre-oxygenation with 100% oxygen Induction Type: IV induction Ventilation: Mask ventilation without difficulty LMA: LMA inserted LMA Size: 4.0 Number of attempts: 1 Airway Equipment and Method: Bite block Placement Confirmation: positive ETCO2 Tube secured with: Tape Dental Injury: Teeth and Oropharynx as per pre-operative assessment

## 2022-02-06 NOTE — Transfer of Care (Signed)
Immediate Anesthesia Transfer of Care Note  Patient: Abigail Berg  Procedure(s) Performed: RADIOACTIVE SEED GUIDED EXCISIONAL LEFT BREAST BIOPSY (Left: Breast)  Patient Location: PACU  Anesthesia Type:General  Level of Consciousness: awake, alert , and oriented  Airway & Oxygen Therapy: Patient Spontanous Breathing and Patient connected to face mask oxygen  Post-op Assessment: Report given to RN and Post -op Vital signs reviewed and stable  Post vital signs: Reviewed and stable  Last Vitals:  Vitals Value Taken Time  BP 143/66 02/06/22 1617  Temp    Pulse 63 02/06/22 1621  Resp 17 02/06/22 1621  SpO2 100 % 02/06/22 1621  Vitals shown include unvalidated device data.  Last Pain:  Vitals:   02/06/22 1333  TempSrc: Oral  PainSc: 0-No pain      Patients Stated Pain Goal: 5 (84/83/50 7573)  Complications: No notable events documented.

## 2022-02-06 NOTE — Interval H&P Note (Signed)
History and Physical Interval Note:  02/06/2022 3:03 PM  Abigail Berg  has presented today for surgery, with the diagnosis of LEFT BREAST MASS.  The various methods of treatment have been discussed with the patient and family. After consideration of risks, benefits and other options for treatment, the patient has consented to  Procedure(s): RADIOACTIVE SEED GUIDED EXCISIONAL LEFT BREAST BIOPSY (Left) as a surgical intervention.  The patient's history has been reviewed, patient examined, no change in status, stable for surgery.  I have reviewed the patient's chart and labs.  Questions were answered to the patient's satisfaction.     Rolm Bookbinder

## 2022-02-06 NOTE — Op Note (Signed)
Preoperative diagnosis: left breast distortion with discordant biopsy Postoperative diagnosis: Same as above Procedure: Left breast radioactive seed guided excisional biopsy Surgeon: Dr. Serita Grammes Anesthesia: General Estimated blood loss: Minimal Specimens: Left breast tissue containing seed and clip confirmed by mammography Complications: None Drains: None Sponge needle count was correct completion Disposition to recovery in stable condition   Indications: 57 year old female who I know from taking care of of breast cancer in 2021. She is CHEK2 positive. She underwent a right breast lumpectomy and sentinel node biopsy for a T1 cN0 M0 breast cancer. This was ER positive, PR negative, HER2 positive. She then underwent chemotherapy with trastuzumab. This was followed by radiotherapy. She started tamoxifen in October 2021 and is tolerating that just fine. She has been undergoing screening MRIs and mammograms due to her mutation. She has no symptoms related to either breast. Her latest mammogram showed her to have C density breast tissue. She has a subtle questionable area of distortion in the lower outer left breast as well as cortical thickening of the lymph node in the left axilla. She underwent a biopsy of the left breast lesion and this shows a complex sclerosing lesion with otherwise negative for malignancy. This was deemed to be discordant with excision recommended by radiology. We discussed excisional biopsy.    Procedure: After informed consent was obtained the patient was taken to the operating room.  She was given antibiotics.  SCDs were in place.  She was placed under general esthesia without complication.  She was prepped and draped in the standard sterile surgical fashion.  Surgical timeout was then performed.    I infiltrated Marcaine.  I made an inframammary  incision in order to hide the scar later.  I then dissected down to the seed.   I removed the seed and some of the surrounding  tissue.  Mammogram confirmed removal of the seed as well as the clip.   This was sent to pathology.  I then obtained hemostasis.  I closed the breast tissue with 2-0 Vicryl.  The skin was closed with 3-0 Vicryl and 4-0 Monocryl.  Glue and Steri-Strips were applied.  She tolerated this well was extubated and transferred to recovery stable.

## 2022-02-06 NOTE — Progress Notes (Signed)
Wedding ring removed by Syl RN and given to husband Loyola

## 2022-02-06 NOTE — Anesthesia Preprocedure Evaluation (Signed)
Anesthesia Evaluation  Patient identified by MRN, date of birth, ID band Patient awake    Reviewed: Allergy & Precautions, NPO status , Patient's Chart, lab work & pertinent test results  History of Anesthesia Complications Negative for: history of anesthetic complications  Airway Mallampati: II  TM Distance: >3 FB Neck ROM: Full    Dental no notable dental hx. (+) Teeth Intact, Dental Advisory Given, Caps   Pulmonary Current Smoker and Patient abstained from smoking.   Pulmonary exam normal breath sounds clear to auscultation       Cardiovascular negative cardio ROS Normal cardiovascular exam Rhythm:Regular Rate:Normal    ECHO 6/22  Left ventricular ejection fraction, by estimation, is 55 to 60%. The left ventricle has normal function. The left ventricular internal cavity size was mildly dilated. There is no left ventricular hypertrophy. Left ventricular diastolic  parameters are indeterminate.   Neuro/Psych  PSYCHIATRIC DISORDERS  Depression     Neuromuscular disease    GI/Hepatic negative GI ROS, Neg liver ROS,,,  Endo/Other  negative endocrine ROS  Primary hyperparathyroidism  Renal/GU negative Renal ROS  negative genitourinary   Musculoskeletal negative musculoskeletal ROS (+)    Abdominal   Peds negative pediatric ROS (+)  Hematology negative hematology ROS (+)   Anesthesia Other Findings  H/o breast cancer  Reproductive/Obstetrics negative OB ROS                             Anesthesia Physical Anesthesia Plan  ASA: 2  Anesthesia Plan: General   Post-op Pain Management: Tylenol PO (pre-op)* and Minimal or no pain anticipated   Induction: Intravenous  PONV Risk Score and Plan: 3 and Ondansetron, Dexamethasone, Treatment may vary due to age or medical condition and Midazolam  Airway Management Planned: LMA  Additional Equipment: None  Intra-op Plan:   Post-operative  Plan: Extubation in OR  Informed Consent: I have reviewed the patients History and Physical, chart, labs and discussed the procedure including the risks, benefits and alternatives for the proposed anesthesia with the patient or authorized representative who has indicated his/her understanding and acceptance.     Dental advisory given  Plan Discussed with: Anesthesiologist and CRNA  Anesthesia Plan Comments:         Anesthesia Quick Evaluation

## 2022-02-06 NOTE — Discharge Instructions (Addendum)
NO TYLENOL TILL 10 PM   [place] Office Phone Number [phone]  POST OP INSTRUCTIONS Take 400 mg of ibuprofen every 8 hours or 650 mg tylenol every 6 hours for next 72 hours then as needed. Use ice several times daily also.  A prescription for pain medication may be given to you upon discharge.  Take your pain medication as prescribed, if needed.  If narcotic pain medicine is not needed, then you may take acetaminophen (Tylenol), naprosyn (Alleve) or ibuprofen (Advil) as needed. Take your usually prescribed medications unless otherwise directed If you need a refill on your pain medication, please contact your pharmacy.  They will contact our office to request authorization.  Prescriptions will not be filled after 5pm or on week-ends. You should eat very light the first 24 hours after surgery, such as soup, crackers, pudding, etc.  Resume your normal diet the day after surgery. Most patients will experience some swelling and bruising in the breast.  Ice packs and a good support bra will help.  Wear the breast binder provided or a sports bra for 72 hours day and night.  After that wear a sports bra during the day until you return to the office. Swelling and bruising can take several days to resolve.  It is common to experience some constipation if taking pain medication after surgery.  Increasing fluid intake and taking a stool softener will usually help or prevent this problem from occurring.  A mild laxative (Milk of Magnesia or Miralax) should be taken according to package directions if there are no bowel movements after 48 hours. I used skin glue on the incision, you may shower in 24 hours.  The glue will flake off over the next 2-3 weeks.  Any sutures or staples will be removed at the office during your follow-up visit. ACTIVITIES:  You may resume regular daily activities (gradually increasing) beginning the next day.  Wearing a good support bra or sports bra minimizes pain  and swelling.  You may have sexual intercourse when it is comfortable. You may drive when you no longer are taking prescription pain medication, you can comfortably wear a seatbelt, and you can safely maneuver your car and apply brakes. RETURN TO WORK:  ______________________________________________________________________________________ Dennis Bast should see your doctor in the office for a follow-up appointment approximately two weeks after your surgery.  Your doctor's nurse will typically make your follow-up appointment when she calls you with your pathology report.  Expect your pathology report 3-4 business days after your surgery.  You may call to check if you do not hear from [place] after three days. OTHER INSTRUCTIONS: _______________________________________________________________________________________________ _____________________________________________________________________________________________________________________________________ _____________________________________________________________________________________________________________________________________ _____________________________________________________________________________________________________________________________________  WHEN TO CALL DR WAKEFIELD: Fever over 101.0 Nausea and/or vomiting. Extreme swelling or bruising. Continued bleeding from incision. Increased pain, redness, or drainage from the incision.  The clinic staff is available to answer your questions during regular business hours.  Please don't hesitate to call and ask to speak to one of the nurses for clinical concerns.  If you have a medical emergency, go to the nearest emergency room or call 911.  A surgeon from [facility] is always on call at the hospital.  For further questions, please visit [email or web address]    Post Anesthesia Home Care Instructions  Activity: Get plenty of rest for the remainder of the day. A  responsible individual must stay with you for 24 hours following the procedure.  For the next 24 hours, DO NOT: -Drive a car Film/video editor -Drink  alcoholic beverages -Take any medication unless instructed by your physician -Make any legal decisions or sign important papers.  Meals: Start with liquid foods such as gelatin or soup. Progress to regular foods as tolerated. Avoid greasy, spicy, heavy foods. If nausea and/or vomiting occur, drink only clear liquids until the nausea and/or vomiting subsides. Call your physician if vomiting continues.  Special Instructions/Symptoms: Your throat may feel dry or sore from the anesthesia or the breathing tube placed in your throat during surgery. If this causes discomfort, gargle with warm salt water. The discomfort should disappear within 24 hours.  If you had a scopolamine patch placed behind your ear for the management of post- operative nausea and/or vomiting:  1. The medication in the patch is effective for 72 hours, after which it should be removed.  Wrap patch in a tissue and discard in the trash. Wash hands thoroughly with soap and water. 2. You may remove the patch earlier than 72 hours if you experience unpleasant side effects which may include dry mouth, dizziness or visual disturbances. 3. Avoid touching the patch. Wash your hands with soap and water after contact with the patch.

## 2022-02-07 ENCOUNTER — Encounter (HOSPITAL_BASED_OUTPATIENT_CLINIC_OR_DEPARTMENT_OTHER): Payer: Self-pay | Admitting: General Surgery

## 2022-02-10 LAB — SURGICAL PATHOLOGY

## 2022-02-20 ENCOUNTER — Other Ambulatory Visit: Payer: Self-pay | Admitting: Hematology

## 2022-03-06 ENCOUNTER — Inpatient Hospital Stay: Payer: BC Managed Care – PPO

## 2022-03-06 ENCOUNTER — Inpatient Hospital Stay: Payer: BC Managed Care – PPO | Attending: Hematology

## 2022-03-06 ENCOUNTER — Inpatient Hospital Stay (HOSPITAL_BASED_OUTPATIENT_CLINIC_OR_DEPARTMENT_OTHER): Payer: BC Managed Care – PPO | Admitting: Hematology

## 2022-03-06 VITALS — BP 124/80 | HR 94 | Temp 97.8°F | Resp 19 | Ht 69.0 in | Wt 184.0 lb

## 2022-03-06 DIAGNOSIS — R911 Solitary pulmonary nodule: Secondary | ICD-10-CM

## 2022-03-06 DIAGNOSIS — Z1589 Genetic susceptibility to other disease: Secondary | ICD-10-CM

## 2022-03-06 DIAGNOSIS — Z170 Estrogen receptor positive status [ER+]: Secondary | ICD-10-CM | POA: Insufficient documentation

## 2022-03-06 DIAGNOSIS — Z1502 Genetic susceptibility to malignant neoplasm of ovary: Secondary | ICD-10-CM

## 2022-03-06 DIAGNOSIS — C50411 Malignant neoplasm of upper-outer quadrant of right female breast: Secondary | ICD-10-CM | POA: Insufficient documentation

## 2022-03-06 DIAGNOSIS — Z1501 Genetic susceptibility to malignant neoplasm of breast: Secondary | ICD-10-CM

## 2022-03-06 DIAGNOSIS — Z79810 Long term (current) use of selective estrogen receptor modulators (SERMs): Secondary | ICD-10-CM | POA: Diagnosis not present

## 2022-03-06 DIAGNOSIS — Z95828 Presence of other vascular implants and grafts: Secondary | ICD-10-CM

## 2022-03-06 DIAGNOSIS — Z1379 Encounter for other screening for genetic and chromosomal anomalies: Secondary | ICD-10-CM | POA: Diagnosis not present

## 2022-03-06 DIAGNOSIS — M8580 Other specified disorders of bone density and structure, unspecified site: Secondary | ICD-10-CM | POA: Insufficient documentation

## 2022-03-06 DIAGNOSIS — Z1509 Genetic susceptibility to other malignant neoplasm: Secondary | ICD-10-CM

## 2022-03-06 LAB — CBC WITH DIFFERENTIAL (CANCER CENTER ONLY)
Abs Immature Granulocytes: 0.01 10*3/uL (ref 0.00–0.07)
Basophils Absolute: 0.1 10*3/uL (ref 0.0–0.1)
Basophils Relative: 1 %
Eosinophils Absolute: 0.1 10*3/uL (ref 0.0–0.5)
Eosinophils Relative: 1 %
HCT: 42.8 % (ref 36.0–46.0)
Hemoglobin: 15.1 g/dL — ABNORMAL HIGH (ref 12.0–15.0)
Immature Granulocytes: 0 %
Lymphocytes Relative: 32 %
Lymphs Abs: 2.3 10*3/uL (ref 0.7–4.0)
MCH: 31.5 pg (ref 26.0–34.0)
MCHC: 35.3 g/dL (ref 30.0–36.0)
MCV: 89.2 fL (ref 80.0–100.0)
Monocytes Absolute: 0.4 10*3/uL (ref 0.1–1.0)
Monocytes Relative: 6 %
Neutro Abs: 4.3 10*3/uL (ref 1.7–7.7)
Neutrophils Relative %: 60 %
Platelet Count: 219 10*3/uL (ref 150–400)
RBC: 4.8 MIL/uL (ref 3.87–5.11)
RDW: 13.2 % (ref 11.5–15.5)
WBC Count: 7.2 10*3/uL (ref 4.0–10.5)
nRBC: 0 % (ref 0.0–0.2)

## 2022-03-06 LAB — CMP (CANCER CENTER ONLY)
ALT: 8 U/L (ref 0–44)
AST: 13 U/L — ABNORMAL LOW (ref 15–41)
Albumin: 3.8 g/dL (ref 3.5–5.0)
Alkaline Phosphatase: 47 U/L (ref 38–126)
Anion gap: 7 (ref 5–15)
BUN: 16 mg/dL (ref 6–20)
CO2: 28 mmol/L (ref 22–32)
Calcium: 9.6 mg/dL (ref 8.9–10.3)
Chloride: 104 mmol/L (ref 98–111)
Creatinine: 0.81 mg/dL (ref 0.44–1.00)
GFR, Estimated: >60 mL/min (ref >60)
Glucose, Bld: 105 mg/dL — ABNORMAL HIGH (ref 70–99)
Potassium: 4.2 mmol/L (ref 3.5–5.1)
Sodium: 139 mmol/L (ref 135–145)
Total Bilirubin: 0.3 mg/dL (ref 0.3–1.2)
Total Protein: 6.8 g/dL (ref 6.5–8.1)

## 2022-03-06 MED ORDER — TAMOXIFEN CITRATE 20 MG PO TABS: ORAL_TABLET | ORAL | 1 refills | Status: DC

## 2022-03-06 MED ORDER — ZOLEDRONIC ACID 4 MG/100ML IV SOLN
4.0000 mg | Freq: Once | INTRAVENOUS | Status: AC
  Administered 2022-03-06: 4 mg via INTRAVENOUS
  Filled 2022-03-06: qty 100

## 2022-03-06 MED ORDER — SODIUM CHLORIDE 0.9 % IV SOLN: Freq: Once | INTRAVENOUS | Status: AC

## 2022-03-06 MED ORDER — ESCITALOPRAM OXALATE 20 MG PO TABS: ORAL_TABLET | ORAL | 1 refills | Status: DC

## 2022-03-06 NOTE — Patient Instructions (Signed)

## 2022-03-06 NOTE — Assessment & Plan Note (Signed)
-  current smoker, 1/2 ppd -initially seen on chest CT 05/24/20. -repeat CT chest 07/18/21 showed decrease in RUL nodule and adjacent radiation pneumonitis; new nonspecific 0.5 cm nodule in RUL. -will repeat CT in 12 months

## 2022-03-06 NOTE — Assessment & Plan Note (Signed)
Stage 1A, p(T1cN0M0), stage IA, ER+/PR-/HER2+, Grade 3.  -diagnosed in 05/2019. S/p right breast lumpectomy, weekly Abraxane and trastuzumab 07/29/19 - 10/12/19, adjuvant radiation, and maintenance trastuzumab Calla Kicks) q3weeks 11/03/19 - 07/12/20. -She started adjuvant tamoxifen in 11/2019, tolerating well overall with mild hot flashes. Her last period was 02/2019, prior to chemo. Last [facility] 11/22/20 showed she is still perimenopausal.  -most recent screening MRI on 05/16/21 and mammogram on 11/22/20 were benign. -Due to abnormal screening mammogram in November 2023, she underwent a biopsy of left breast which showed complex sclerosing lesion, she underwent surgical biopsy on February 06, 2022 and surgical path was benign without malignancies or high risk features.

## 2022-03-06 NOTE — Assessment & Plan Note (Signed)
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  -she has h/o colon polyps, last screening colonoscopy 10/11/20 with Dr. Havery Moros was benign. Next due in 3-5 years -She was previously on chemoprevention with Tamoxifen 10/2018-06/17/19, restarted 11/2019 following breast cancer treatment

## 2022-03-06 NOTE — Progress Notes (Signed)
[place]   Telephone:([phone]) [phone] Fax:(336) [phone]   Clinic Follow up Note   Patient Care Team: Patient, [name] as PCP - General (Abigail Berg) Minus Breeding, MD as PCP - Cardiology (Cardiology) Armbruster, Carlota Raspberry, MD as Consulting Physician (Gastroenterology) Eyvonne Mechanic as Counselor (Licensed Clinical Social Worker) Truitt Merle, MD as Consulting Physician (Hematology) Alla Feeling, NP as Nurse Practitioner (Nurse Practitioner) Mauro Kaufmann, RN as Oncology Nurse Navigator Rockwell Germany, RN as Oncology Nurse Navigator Rolm Bookbinder, MD as Consulting Physician (General Surgery)  Date of Service:  03/06/2022  CHIEF COMPLAINT: f/u of  right breast cancer   CURRENT THERAPY:  Tamoxifen, started 11/2019   ASSESSMENT:  Abigail Berg is a 57 y.o. female with   Malignant neoplasm of upper-outer quadrant of right breast in female, estrogen receptor positive (Abigail Berg) Stage 1A, p(T1cN0M0), stage IA, ER+/PR-/HER2+, Grade 3.  -diagnosed in 05/2019. S/p right breast lumpectomy, weekly Abraxane and trastuzumab 07/29/19 - 10/12/19, adjuvant radiation, and maintenance trastuzumab Abigail Berg) q3weeks 11/03/19 - 07/12/20. -She started adjuvant tamoxifen in 11/2019, tolerating well overall with mild hot flashes. Her last period was 02/2019, prior to chemo. Last [facility] 11/22/20 showed she is still perimenopausal.  -most recent screening MRI on 05/16/21 and mammogram on 11/22/20 were benign. -Due to abnormal screening mammogram in November 2023, she underwent a biopsy of left breast which showed complex sclerosing lesion, she underwent surgical biopsy on February 06, 2022 and surgical path was benign without malignancies or high risk features.  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  -she has h/o  colon polyps, last screening colonoscopy 10/11/20 with Dr. [name] was benign. Next due in 3-5 years -She was previously on chemoprevention with Tamoxifen 10/2018-06/17/19, restarted 11/2019 following breast cancer treatment  Lung nodule -current smoker, 1/2 ppd -initially seen on chest CT 05/24/20. -repeat CT chest 07/18/21 showed decrease in RUL nodule and adjacent radiation pneumonitis; new nonspecific 0.5 cm nodule in RUL. -will repeat CT in 12 months   Osteopenia -Most recent bone density from 12/08/19 was -1.7. -She began Zometa, q35month, on 08/30/20 and will complete 2 years therapy today  -I advised her to continue calcium, especially on Zometa. -She knows to continue with regular dentist checkups and cleaning     PLAN: -Lab reviewed, will proceed last dose of Zometa today - order breast screening MRI in April -order CT Chest Scan on same day of next office visit  -I refill Tamoxifen -f/u in 6 months   SUMMARY OF ONCOLOGIC HISTORY: Oncology History Overview Note  Cancer Staging Malignant neoplasm of upper-outer quadrant of right breast in female, estrogen receptor positive ([place] Staging form: Breast, AJCC 8th Edition - Clinical stage from 06/01/2019: Stage IA (cT1c, cN0, cM0, G3, ER+, PR-, HER2+) - Signed by FTruitt Merle MD on 06/17/2019 - Pathologic stage from 06/30/2019: Stage IA (pT1c, pN0, cM0, G3, ER+, PR-, HER2+) - Signed by FTruitt Merle MD on 07/15/2019    Malignant neoplasm of upper-outer quadrant of right breast in female, estrogen receptor positive ([place] [facility]  10/2018 - 06/17/2019 Anti-estrogen oral therapy   She was preventively started on Tamoxifen since 10/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 Held after 06/17/19 to proceed with her breast cancer surgery and chemo.    05/06/2019 Breast MRI   IMPRESSION Enhancing right breast mass suspicious for malignancy.  Second look [place] if recommended.  No Suspicious enhancing left breast masses.    05/24/2019 Breast US   IMPRESSION 1.8cm  irregular right breast mass within the 9:30 position 3.5cm from the nipple is highly suggestive of malignancy. An Ultrasound guided breast biopsy is recommended. The findings of the study and recommendation for biopsy were discussed with the patient directly.    06/01/2019 Cancer Staging   Staging form: Breast, AJCC 8th Edition - Clinical stage from 06/01/2019: Stage IA (cT1c, cN0, cM0, G3, ER+, PR-, HER2+) - Signed by Truitt Merle, MD on 06/17/2019   06/01/2019 Initial Biopsy   Breast Biopsy - Right Breast Core Bx DIAGNOSIS INFILTRATING DUCT CARCINOMA WITH MINOR DUCTAL CARCINOMA IN SITU COMPNENT OF RIGHT BREAST, CORE NEEDLE BIOPSY    06/01/2019 Receptors her2   ER: greater than 75% of tumor cells show moderate staining  PR: Negative  HER2: Positive 3+ Ki67: 40%   06/17/2019 Initial Diagnosis   Malignant neoplasm of upper-outer quadrant of right breast in female, estrogen receptor positive ([place])   06/30/2019 Surgery   RIGHT BREAST LUMPECTOMY WITH RADIOACTIVE SEED AND RIGHT AXILLARY SENTINEL NODE BIOPSY and PAC placement  by Dr Donne Hazel     06/30/2019 Pathology Results   FINAL MICROSCOPIC DIAGNOSIS:   A. BREAST, RIGHT, LUMPECTOMY:  - Invasive ductal carcinoma, grade 3, spanning 1.5 cm.  - Intermediate grade ductal carcinoma in situ.  - Invasive carcinoma is 0.1-0.2 from the final lateral margin (part E).  - Margins are negative for in situ carcinoma.  - Biopsy site.  - See oncology table.   B. LYMPH NODE, RIGHT AXILLARY, SENTINEL, BIOPSY:  - One of one lymph nodes negative for carcinoma (0/1).   C. LYMPH NODE, RIGHT AXILLARY, SENTINEL, BIOPSY:  - One of one lymph nodes negative for carcinoma (0/1).   D. LYMPH NODE, RIGHT AXILLARY, SENTINEL, BIOPSY:  - One of one lymph nodes negative for carcinoma (0/1).   E. BREAST, RIGHT ADDITIONAL LATERAL MARGIN, EXCISION:  - Invasive ductal carcinoma.  - Invasive carcinoma is 0.1-0.2 cm from the new lateral margin.   F. BREAST, RIGHT  ADDITIONAL POSTERIOR MARGIN, EXCISION:  - Fibrocystic change and usual ductal hyperplasia.  - No malignancy identified.   G. BREAST, RIGHT ADDITIONAL SUPERIOR MARGIN, EXCISION:  - Fibrocystic change and usual ductal hyperplasia.  - Incidental radial scar.  - No malignancy identified.     PROGNOSTIC INDICATOR RESULTS:  The tumor cells are POSITIVE for Her2 (3+).  Estrogen Receptor:       POSITIVE, 85%, MODERATE STAINING  Progesterone Receptor:   NEGATIVE  Proliferation Marker Ki-67:   25%    06/30/2019 Cancer Staging   Staging form: Breast, AJCC 8th Edition - Pathologic stage from 06/30/2019: Stage IA (pT1c, pN0, cM0, G3, ER+, PR-, HER2+) - Signed by Truitt Merle, MD on 07/15/2019   07/29/2019 - 07/12/2020 Chemotherapy   Adjuvant Weekly Abraxane (Due to her insurance denial of Abraxane, I changed it to paclitaxel starting 09/15/19) and Transtuzumab for 12 weeks starting 07/29/19-10/12/19, followed by maintenance trastuzumab q3weeks starting 11/03/19 to complete 1 year of treatment. Due to headaches her trastuzumab ([place]givr) was switched to trastuzumab Abigail Berg) on 09/08/19.     11/24/2019 -  Anti-estrogen oral therapy   Tamoxifen   12/2019 - 01/2020 Radiation Therapy   Outside Radiation in late 2021.       INTERVAL HISTORY:  ALLYSHA TRYON is here for a follow up of  right breast cancer  She was last seen by me on 08/29/2021  She presents to the clinic accompanied by family member. Pt states she has no concerns at the surgical site. Pt states this is her last dose of Zometa.   All other systems were reviewed with the patient and are negative.  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   Personal history of colonic polyps    Pneumonia    S/P partial colectomy 2017    SURGICAL HISTORY: Past Surgical History:  Procedure Laterality Date   BREAST BIOPSY Left 12/19/2021   MM LT BREAST BX W LOC DEV 1ST LESION IMAGE BX SPEC STEREO GUIDE 12/19/2021 [facility]   BREAST BIOPSY  02/05/2022   MM LT RADIOACTIVE SEED LOC MAMMO GUIDE 02/05/2022 [facility]   BREAST LUMPECTOMY Right 06/30/2019   right breast lumpectomy IDC w/ rad w/chemo   BREAST LUMPECTOMY WITH RADIOACTIVE SEED AND SENTINEL LYMPH NODE BIOPSY Right 06/30/2019   Procedure: RIGHT BREAST LUMPECTOMY WITH RADIOACTIVE SEED AND RIGHT AXILLARY SENTINEL NODE BIOPSY;  Surgeon: Rolm Bookbinder, MD;  Location: [place];  Service: General;  Laterality: Right;  [place] SURGERY  2017   Partial colectomy   COLONOSCOPY     PARATHYROIDECTOMY Left 01/25/2020   Procedure: LEFT INFERIOR PARATHYROIDECTOMY;  Surgeon: Armandina Gemma, MD;  Location: [facility];  Service: General;  Laterality: Left;   POLYPECTOMY     PORT-A-CATH REMOVAL Left 10/24/2020   Procedure: REMOVAL PORT-A-CATH;  Surgeon: Rolm Bookbinder, MD;  Location: [place];  Service: General;  Laterality: Left;   PORTACATH PLACEMENT Left 06/30/2019   Procedure: INSERTION PORT-A-CATH WITH ULTRASOUND GUIDANCE;  Surgeon: Rolm Bookbinder, MD;  Location: [place];  Service: General;  Laterality: Left;   RADIOACTIVE SEED GUIDED EXCISIONAL BREAST BIOPSY Left 02/06/2022   Procedure: RADIOACTIVE SEED GUIDED EXCISIONAL LEFT BREAST BIOPSY;  Surgeon: Rolm Bookbinder, MD;  Location: [place];  Service: General;  Laterality: Left;   VENTRAL HERNIA REPAIR N/A 01/25/2020   Procedure: HERNIA REPAIR VENTRAL ADULT WITH MESH;   Surgeon: Armandina Gemma, MD;  Location: [facility];  Service: General;  Laterality: N/A;    I have reviewed the social history and family history with the patient and they are unchanged from previous note.  ALLERGIES:  has No Known Allergies.  MEDICATIONS:  Current Outpatient Medications  Medication Sig Dispense Refill   CALCIUM PO Take by mouth.     Cholecalciferol (VITAMIN D) 50 MCG (2000 UT) tablet Take 2,000 Units by mouth daily.     escitalopram (LEXAPRO) 20 MG tablet TAKE 1 TABLET(20 MG) BY MOUTH DAILY 90 tablet 1   ibuprofen (ADVIL) 200 MG tablet Take 600 mg by mouth every 6 (six) hours as needed for headache or mild pain.     tamoxifen (NOLVADEX) 20 MG tablet TAKE 1 TABLET(20 MG) BY MOUTH DAILY 90 tablet 1   Zoledronic Acid (ZOMETA IV) Inject into the vein.  INFUSION EVERY 6 MONTHS FOR BONES AFTER CHEMO FOR BREAST CANCER     No current facility-administered medications for this visit.    PHYSICAL EXAMINATION: ECOG PERFORMANCE STATUS: 0 - Asymptomatic  Vitals:   03/06/22 1412  BP: 124/80  Pulse: 94  Resp: 19  Temp: 97.8 F (36.6 C)  SpO2: 98%   Wt Readings from Last 3 Encounters:  03/06/22 184 lb (83.5 kg)  02/06/22 179 lb 14.3 oz (81.6 kg)  10/21/21 170 lb (77.1 kg)     GENERAL:alert, no distress and comfortable SKIN: skin color, texture, turgor are normal, no rashes or significant lesions EYES: normal, Conjunctiva are pink and non-injected, sclera clear NECK: supple, thyroid normal size, non-tender, without nodularity LYMPH:(-)   no palpable lymphadenopathy in the cervical, axillary  LUNGS:(-)  clear to auscultation and percussion with normal breathing effort HEART: (-) regular rate & rhythm and no murmurs and no lower extremity edema ABDOMEN:(-) abdomen soft, non-tender and normal bowel sounds Musculoskeletal:no cyanosis of digits and no clubbing  NEURO: alert & oriented x 3 with fluent speech, no focal motor/sensory deficits BREAST: RT breast Lumpectomy , Lt Breast  incision healed well. No Palpable mass, breast exam benign.       LABORATORY DATA:  I have reviewed the data as listed    Latest Ref Rng & Units 03/06/2022    1:30 PM 10/21/2021    8:00 PM 08/29/2021    1:16 PM  CBC  WBC 4.0 - 10.5 K/uL 7.2  7.8  6.1   Hemoglobin 12.0 - 15.0 g/dL 15.1  14.5  15.0   Hematocrit 36.0 - 46.0 % 42.8  41.9  42.3   Platelets 150 - 400 K/uL 219  228  187         Latest Ref Rng & Units 03/06/2022    1:30 PM 10/21/2021    8:00 PM 08/29/2021    1:16 PM  CMP  Glucose 70 - 99 mg/dL 105  101  123   BUN 6 - 20 mg/dL '16  14  15   '$ Creatinine 0.44 - 1.00 mg/dL 0.81  0.77  0.93   Sodium 135 - 145 mmol/L 139  138  137   Potassium 3.5 - 5.1 mmol/L 4.2  3.9  3.8   Chloride 98 - 111 mmol/L 104  104  104   CO2 22 - 32 mmol/L '28  26  25   '$ Calcium 8.9 - 10.3 mg/dL 9.6  8.8  9.6   Total Protein 6.5 - 8.1 g/dL 6.8   6.9   Total Bilirubin 0.3 - 1.2 mg/dL 0.3   0.4   Alkaline Phos 38 - 126 U/L 47   36   AST 15 - 41 U/L 13   13   ALT 0 - 44 U/L 8   8       RADIOGRAPHIC STUDIES: I have personally reviewed the radiological images as listed and agreed with the findings in the report. No results found.    Orders Placed This Encounter  Procedures   MR BREAST BILATERAL W WO CONTRAST INC CAD    Standing Status:   Future    Standing Expiration Date:   03/07/2023    Order Specific Question:   If indicated for the ordered procedure, I authorize the administration of contrast media per Radiology protocol    Answer:   Yes    Order Specific Question:   What is the patient's sedation requirement?    Answer:   No Sedation  Order Specific Question:   Does the patient have a pacemaker or implanted devices?    Answer:   No    Order Specific Question:   Radiology Contrast Protocol - do NOT remove file path    Answer:   [email or web address]    Order Specific Question:   Preferred imaging location?    Answer:   [facility] (table  limit-550lbs)   CT Chest Wo Contrast    Standing Status:   Future    Standing Expiration Date:   03/06/2023    Order Specific Question:   Is patient pregnant?    Answer:   No    Order Specific Question:   Preferred imaging location?    Answer:   [facility]   All questions were answered. The patient knows to call the clinic with any problems, questions or concerns. No barriers to learning was detected. The total time spent in the appointment was 30 minutes.     Truitt Merle, MD 03/06/2022   Felicity Coyer, CMA, am acting as scribe for Truitt Merle, MD.   I have reviewed the above documentation for accuracy and completeness, and I agree with the above.

## 2022-03-06 NOTE — Assessment & Plan Note (Signed)
-  Most recent bone density from 12/08/19 was -1.7. -She began Zometa, q75month, on 08/30/20 and will complete 2 years therapy today  -I advised her to continue calcium, especially on Zometa. -She knows to continue with regular dentist checkups and cleaning

## 2022-03-07 ENCOUNTER — Telehealth: Payer: Self-pay | Admitting: Hematology

## 2022-03-07 NOTE — Telephone Encounter (Signed)
Spoke with patient confirming upcoming appointments  

## 2022-03-25 ENCOUNTER — Encounter (HOSPITAL_COMMUNITY): Payer: Self-pay

## 2022-06-05 ENCOUNTER — Ambulatory Visit
Admission: RE | Admit: 2022-06-05 | Discharge: 2022-06-05 | Disposition: A | Discharge status: Home / Self Care | Payer: BC Managed Care – PPO | Source: Ambulatory Visit | Attending: Hematology | Admitting: Hematology

## 2022-06-05 DIAGNOSIS — Z1501 Genetic susceptibility to malignant neoplasm of breast: Secondary | ICD-10-CM

## 2022-06-05 DIAGNOSIS — Z853 Personal history of malignant neoplasm of breast: Secondary | ICD-10-CM | POA: Diagnosis not present

## 2022-06-05 MED ORDER — GADOPICLENOL 0.5 MMOL/ML IV SOLN
8.0000 mL | Freq: Once | INTRAVENOUS | Status: AC | PRN
  Administered 2022-06-05: 8 mL via INTRAVENOUS

## 2022-08-04 DIAGNOSIS — H04123 Dry eye syndrome of bilateral lacrimal glands: Secondary | ICD-10-CM | POA: Diagnosis not present

## 2022-08-04 DIAGNOSIS — H52223 Regular astigmatism, bilateral: Secondary | ICD-10-CM | POA: Diagnosis not present

## 2022-08-29 ENCOUNTER — Telehealth: Payer: Self-pay

## 2022-08-29 DIAGNOSIS — U071 COVID-19: Secondary | ICD-10-CM | POA: Diagnosis not present

## 2022-08-29 DIAGNOSIS — R509 Fever, unspecified: Secondary | ICD-10-CM | POA: Diagnosis not present

## 2022-08-29 NOTE — Telephone Encounter (Signed)
Called Mrs. Clere back about rescheduling her CT scan on 8/1 due to a recent COVID positive diagnosis. She tested positive on a home test yesterday and had a high fever. Her husband also has COVID so she is sure she has it, but she had a PCR today that is pending.  I advised her to count yesterday as Day 0 and to mask around others for 5 days and try to stay home during those 5 days.  She will be 7 days out on 8/1 and should still be able to get her CT scan. Advised her that if she is still symptomatic, that she needs to still wear a mask in public.  She is worried that her CT scan will show something different based on her COVID diagnosis.  I advised her that if she was still symptomatic on 8/1, that she may want to consider rescheduling her scan.  Routing note to Dr. Mosetta Putt for awareness. Lorayne Marek, RN

## 2022-09-04 ENCOUNTER — Inpatient Hospital Stay: Payer: BC Managed Care – PPO | Attending: Hematology

## 2022-09-04 ENCOUNTER — Ambulatory Visit (HOSPITAL_COMMUNITY)
Admission: RE | Admit: 2022-09-04 | Discharge: 2022-09-04 | Disposition: A | Discharge status: Home / Self Care | Payer: BC Managed Care – PPO | Source: Ambulatory Visit | Attending: Hematology | Admitting: Hematology

## 2022-09-04 ENCOUNTER — Other Ambulatory Visit: Payer: Self-pay

## 2022-09-04 DIAGNOSIS — R911 Solitary pulmonary nodule: Secondary | ICD-10-CM | POA: Insufficient documentation

## 2022-09-04 DIAGNOSIS — Z1501 Genetic susceptibility to malignant neoplasm of breast: Secondary | ICD-10-CM

## 2022-09-04 DIAGNOSIS — R918 Other nonspecific abnormal finding of lung field: Secondary | ICD-10-CM | POA: Diagnosis not present

## 2022-09-04 DIAGNOSIS — C50919 Malignant neoplasm of unspecified site of unspecified female breast: Secondary | ICD-10-CM | POA: Diagnosis not present

## 2022-09-04 DIAGNOSIS — C50411 Malignant neoplasm of upper-outer quadrant of right female breast: Secondary | ICD-10-CM | POA: Insufficient documentation

## 2022-09-04 LAB — CBC WITH DIFFERENTIAL (CANCER CENTER ONLY)
Abs Immature Granulocytes: 0.01 10*3/uL (ref 0.00–0.07)
Basophils Absolute: 0 10*3/uL (ref 0.0–0.1)
Basophils Relative: 1 %
Eosinophils Absolute: 0.1 10*3/uL (ref 0.0–0.5)
Eosinophils Relative: 2 %
HCT: 42.1 % (ref 36.0–46.0)
Hemoglobin: 14.6 g/dL (ref 12.0–15.0)
Immature Granulocytes: 0 %
Lymphocytes Relative: 38 %
Lymphs Abs: 2 10*3/uL (ref 0.7–4.0)
MCH: 30.7 pg (ref 26.0–34.0)
MCHC: 34.7 g/dL (ref 30.0–36.0)
MCV: 88.4 fL (ref 80.0–100.0)
Monocytes Absolute: 0.3 10*3/uL (ref 0.1–1.0)
Monocytes Relative: 5 %
Neutro Abs: 2.9 10*3/uL (ref 1.7–7.7)
Neutrophils Relative %: 54 %
Platelet Count: 212 10*3/uL (ref 150–400)
RBC: 4.76 MIL/uL (ref 3.87–5.11)
RDW: 13.2 % (ref 11.5–15.5)
WBC Count: 5.3 10*3/uL (ref 4.0–10.5)
nRBC: 0 % (ref 0.0–0.2)

## 2022-09-04 LAB — CMP (CANCER CENTER ONLY)
ALT: 17 U/L (ref 0–44)
AST: 21 U/L (ref 15–41)
Albumin: 4 g/dL (ref 3.5–5.0)
Alkaline Phosphatase: 36 U/L — ABNORMAL LOW (ref 38–126)
Anion gap: 9 (ref 5–15)
BUN: 14 mg/dL (ref 6–20)
CO2: 24 mmol/L (ref 22–32)
Calcium: 8.6 mg/dL — ABNORMAL LOW (ref 8.9–10.3)
Chloride: 108 mmol/L (ref 98–111)
Creatinine: 0.87 mg/dL (ref 0.44–1.00)
GFR, Estimated: >60 mL/min (ref >60)
Glucose, Bld: 114 mg/dL — ABNORMAL HIGH (ref 70–99)
Potassium: 3.6 mmol/L (ref 3.5–5.1)
Sodium: 141 mmol/L (ref 135–145)
Total Bilirubin: 0.4 mg/dL (ref 0.3–1.2)
Total Protein: 6.8 g/dL (ref 6.5–8.1)

## 2022-09-09 ENCOUNTER — Other Ambulatory Visit: Payer: Self-pay

## 2022-09-09 NOTE — Progress Notes (Signed)
Spoke w/Gab in the Reading Room at [facility] to expedite the read on pt's diagnostic imaging test.  Gab stated he would expedite the read.

## 2022-09-09 NOTE — Progress Notes (Signed)
Patient called in today stating that she is covid positive again for a second time this month. She has an appointment with Dr.Feng on 8/8 and needed to know what to do. I changed her appointment to a phone appointment so that Dr. Mosetta Putt could go over her scan results from 8/1. Patient voiced full understanding and was very appreciative that we are working with her.

## 2022-09-10 ENCOUNTER — Ambulatory Visit: Payer: BC Managed Care – PPO | Admitting: Hematology

## 2022-09-10 NOTE — Assessment & Plan Note (Signed)
-  Most recent bone density from 12/08/19 was -1.7. -She began Zometa, q75month, on 08/30/20 and will complete 2 years therapy today  -I advised her to continue calcium, especially on Zometa. -She knows to continue with regular dentist checkups and cleaning

## 2022-09-10 NOTE — Assessment & Plan Note (Signed)
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  -she has h/o colon polyps, last screening colonoscopy 10/11/20 with Dr. Havery Moros was benign. Next due in 3-5 years -She was previously on chemoprevention with Tamoxifen 10/2018-06/17/19, restarted 11/2019 following breast cancer treatment

## 2022-09-10 NOTE — Assessment & Plan Note (Signed)
Stage 1A, p(T1cN0M0), stage IA, ER+/PR-/HER2+, Grade 3.  -diagnosed in 05/2019. S/p right breast lumpectomy, weekly Abraxane and trastuzumab 07/29/19 - 10/12/19, adjuvant radiation, and maintenance trastuzumab [name]) q3weeks 11/03/19 - 07/12/20. -She started adjuvant tamoxifen in 11/2019, tolerating well overall with mild hot flashes. Her last period was 02/2019, prior to chemo. Last [facility] 11/22/20 showed she is still perimenopausal.  -most recent screening MRI on 05/16/21 and mammogram on 11/22/20 were benign. -Due to abnormal screening mammogram in November 2023, she underwent a biopsy of left breast which showed complex sclerosing lesion, she underwent surgical biopsy on February 06, 2022 and surgical path was benign without malignancies or high risk features. -Reviewed CT scan from September 04, 2022 showed stable 5 mm lung nodule, postradiation change, no evidence of cancer recurrence. -She is clinically doing well, will continue tamoxifen and cancer surveillance.

## 2022-09-10 NOTE — Progress Notes (Unsigned)
[facility]   [phone] [phone]   Clinic Follow up Note   Patient Care Team: Patient, [name] as PCP - General (General Practice) Rollene Rotunda, MD as PCP - Cardiology (Cardiology) Armbruster, Willaim Rayas, MD as Consulting Physician (Gastroenterology) Minerva Areola as Counselor (Licensed Clinical Social Worker) [name], MD as Consulting Physician (Hematology) Pollyann Samples, NP as Nurse Practitioner (Nurse Practitioner) Pershing Proud, RN as Oncology Nurse Navigator Donnelly Angelica, RN as Oncology Nurse Navigator Emelia Loron, MD as Consulting Physician (General Surgery)  Date of Service:  09/11/2022  CHIEF COMPLAINT: f/u of  right breast cancer   I connected with Abigail Berg on 09/11/22 at 11:00 AM EDT by telephone and verified that I am speaking with the correct person using two identifiers.   I discussed the limitations, risks, security and privacy concerns of performing an evaluation and management service by telephone and the availability of in person appointments. I also discussed with the patient that there may be a patient responsible charge related to this service. The patient expressed understanding and agreed to proceed.   Patient's location:  home  Provider's location:  office   CURRENT THERAPY:  Tamoxifen, started 11/2019   ASSESSMENT:  Abigail Berg is a 57 y.o. female with   Malignant neoplasm of upper-outer quadrant of right breast in female, estrogen receptor positive (HCC) Stage 1A, p(T1cN0M0), stage IA, ER+/PR-/HER2+, Grade 3.  -diagnosed in 05/2019. S/p right breast lumpectomy, weekly Abraxane and trastuzumab 07/29/19 - 10/12/19, adjuvant radiation, and maintenance trastuzumab [name]) q3weeks 11/03/19 - 07/12/20. -She started adjuvant tamoxifen in 11/2019, tolerating well overall with mild hot flashes. Her last period was 02/2019, prior to chemo. Last [facility] 11/22/20 showed she is still perimenopausal.  -most recent  screening MRI on 05/16/21 and mammogram on 11/22/20 were benign. -Due to abnormal screening mammogram in November 2023, she underwent a biopsy of left breast which showed complex sclerosing lesion, she underwent surgical biopsy on February 06, 2022 and surgical path was benign without malignancies or high risk features. -Reviewed CT scan from September 04, 2022 showed stable 5 mm lung nodule, postradiation change, no evidence of cancer recurrence. -She is clinically doing well, will continue tamoxifen and cancer surveillance.  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  -she has h/o colon polyps, last screening colonoscopy 10/11/20 with Dr. Adela Lank was benign. Next due in 3-5 years -She was previously on chemoprevention with Tamoxifen 10/2018-06/17/19, restarted 11/2019 following breast cancer treatment  Osteopenia -Most recent bone density from 12/08/19 was -1.7. -She began Zometa, q25months, on 08/30/20 and will complete 2 years therapy today  -I advised her to continue calcium, especially on Zometa. -She knows to continue with regular dentist checkups and cleaning    COVID -Patient was diagnosed with COVID 3 days ago, she is recovering well.  PLAN: -Lab and CT scan results reviewed with patient, she is doing well -Lab and follow-up in 6 months -Mammogram in November 2024, I ordered -I refilled her tamoxifen and Lexapro  SUMMARY OF ONCOLOGIC HISTORY: Oncology History Overview Note  Cancer Staging Malignant neoplasm of upper-outer quadrant of right breast in female, estrogen receptor positive (HCC) Staging form: Breast, AJCC 8th Edition - Clinical stage from 06/01/2019: Stage IA (cT1c, cN0, cM0, G3, ER+, PR-, HER2+) - Signed by [name], MD on  06/17/2019 - Pathologic stage from 06/30/2019: Stage IA (pT1c, pN0, cM0, G3,  ER+, PR-, HER2+) - Signed by [name], MD on 07/15/2019    Malignant neoplasm of upper-outer quadrant of right breast in female, estrogen receptor positive (HCC)  10/2018 - 06/17/2019 Anti-estrogen oral therapy   She was preventively started on Tamoxifen since 10/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 Held after 06/17/19 to proceed with her breast cancer surgery and chemo.    05/06/2019 Breast MRI   IMPRESSION Enhancing right breast mass suspicious for malignancy. Second look [place] if recommended.  No Suspicious enhancing left breast masses.    05/24/2019 Breast US   IMPRESSION 1.8cm irregular right breast mass within the 9:30 position 3.5cm from the nipple is highly suggestive of malignancy. An Ultrasound guided breast biopsy is recommended. The findings of the study and recommendation for biopsy were discussed with the patient directly.    06/01/2019 Cancer Staging   Staging form: Breast, AJCC 8th Edition - Clinical stage from 06/01/2019: Stage IA (cT1c, cN0, cM0, G3, ER+, PR-, HER2+) - Signed by [name], MD on 06/17/2019   06/01/2019 Initial Biopsy   Breast Biopsy - Right Breast Core Bx DIAGNOSIS INFILTRATING DUCT CARCINOMA WITH MINOR DUCTAL CARCINOMA IN SITU COMPNENT OF RIGHT BREAST, CORE NEEDLE BIOPSY    06/01/2019 Receptors her2   ER: greater than 75% of tumor cells show moderate staining  PR: Negative  HER2: Positive 3+ Ki67: 40%   06/17/2019 Initial Diagnosis   Malignant neoplasm of upper-outer quadrant of right breast in female, estrogen receptor positive (HCC)   06/30/2019 Surgery   RIGHT BREAST LUMPECTOMY WITH RADIOACTIVE SEED AND RIGHT AXILLARY SENTINEL NODE BIOPSY and PAC placement  by Dr Dwain Sarna     06/30/2019 Pathology Results   FINAL MICROSCOPIC DIAGNOSIS:   A. BREAST, RIGHT, LUMPECTOMY:  - Invasive ductal carcinoma, grade 3, spanning 1.5 cm.  - Intermediate grade ductal carcinoma in situ.  - Invasive carcinoma is 0.1-0.2 from the final lateral margin (part E).  - Margins are  negative for in situ carcinoma.  - Biopsy site.  - See oncology table.   B. LYMPH NODE, RIGHT AXILLARY, SENTINEL, BIOPSY:  - One of one lymph nodes negative for carcinoma (0/1).   C. LYMPH NODE, RIGHT AXILLARY, SENTINEL, BIOPSY:  - One of one lymph nodes negative for carcinoma (0/1).   D. LYMPH NODE, RIGHT AXILLARY, SENTINEL, BIOPSY:  - One of one lymph nodes negative for carcinoma (0/1).   E. BREAST, RIGHT ADDITIONAL LATERAL MARGIN, EXCISION:  - Invasive ductal carcinoma.  - Invasive carcinoma is 0.1-0.2 cm from the new lateral margin.   F. BREAST, RIGHT ADDITIONAL POSTERIOR MARGIN, EXCISION:  - Fibrocystic change and usual ductal hyperplasia.  - No malignancy identified.   G. BREAST, RIGHT ADDITIONAL SUPERIOR MARGIN, EXCISION:  - Fibrocystic change and usual ductal hyperplasia.  - Incidental radial scar.  - No malignancy identified.     PROGNOSTIC INDICATOR RESULTS:  The tumor cells are POSITIVE for Her2 (3+).  Estrogen Receptor:       POSITIVE, 85%, MODERATE STAINING  Progesterone Receptor:   NEGATIVE  Proliferation Marker Ki-67:   25%    06/30/2019 Cancer Staging   Staging form: Breast, AJCC 8th Edition - Pathologic stage from 06/30/2019: Stage IA (pT1c, pN0, cM0, G3, ER+, PR-, HER2+) - Signed by [name], MD on 07/15/2019   07/29/2019 - 07/12/2020 Chemotherapy   Adjuvant Weekly Abraxane (Due to her insurance denial of Abraxane, I changed it to paclitaxel starting 09/15/19) and [employer]  for 12 weeks starting 07/29/19-10/12/19, followed by maintenance trastuzumab q3weeks starting 11/03/19 to complete 1 year of treatment. Due to headaches her trastuzumab (Ogivr) was switched to trastuzumab [name]) on 09/08/19.     11/24/2019 -  Anti-estrogen oral therapy   Tamoxifen   12/2019 - 01/2020 Radiation Therapy   Outside Radiation in late 2021.       INTERVAL HISTORY:  Abigail Berg is called for a virtual visit and follow up of  right breast cancer  She was last seen by me  on 03/06/2022.  She verified to her identity.  She was diagnosed with COVID 3 days ago, with fever, sore throat, and cough.  She is recovering well, has resolved.  No other new complaints.  All other systems were reviewed with the patient and are negative.  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   Personal history of colonic polyps    Pneumonia    S/P partial colectomy 2017    SURGICAL HISTORY: Past Surgical History:  Procedure Laterality Date   BREAST BIOPSY Left 12/19/2021   MM LT BREAST BX W LOC DEV 1ST LESION IMAGE BX SPEC STEREO GUIDE 12/19/2021 [facility]   BREAST BIOPSY  02/05/2022   MM LT RADIOACTIVE SEED LOC MAMMO GUIDE 02/05/2022 [facility]   BREAST LUMPECTOMY Right 06/30/2019   right breast lumpectomy IDC w/ rad w/chemo   BREAST LUMPECTOMY WITH RADIOACTIVE SEED AND SENTINEL LYMPH NODE BIOPSY Right 06/30/2019   Procedure: RIGHT BREAST LUMPECTOMY WITH RADIOACTIVE SEED AND RIGHT AXILLARY SENTINEL NODE BIOPSY;  Surgeon: Emelia Loron, MD;  Location: [facility];  Service: General;  Laterality: Right;  PEC BLOCK   BREAST SURGERY     COLON SURGERY  2017   Partial colectomy   COLONOSCOPY     PARATHYROIDECTOMY Left 01/25/2020   Procedure: LEFT INFERIOR PARATHYROIDECTOMY;  Surgeon: Darnell Level, MD;  Location: [facility];  Service: General;  Laterality: Left;   POLYPECTOMY     PORT-A-CATH REMOVAL Left 10/24/2020   Procedure: REMOVAL PORT-A-CATH;  Surgeon: Emelia Loron, MD;  Location:  [facility];  Service: General;  Laterality: Left;   PORTACATH PLACEMENT Left 06/30/2019   Procedure: INSERTION PORT-A-CATH WITH ULTRASOUND GUIDANCE;  Surgeon: Emelia Loron, MD;  Location: [facility];  Service: General;  Laterality: Left;   RADIOACTIVE SEED GUIDED EXCISIONAL BREAST BIOPSY Left 02/06/2022   Procedure: RADIOACTIVE SEED GUIDED EXCISIONAL LEFT BREAST BIOPSY;  Surgeon: Emelia Loron, MD;  Location: [facility];  Service: General;  Laterality: Left;   VENTRAL HERNIA REPAIR N/A 01/25/2020   Procedure: HERNIA REPAIR VENTRAL ADULT WITH MESH;  Surgeon: Darnell Level, MD;  Location: [facility];  Service: General;  Laterality: N/A;    I have reviewed the social history and family history with the patient and they are unchanged from previous note.  ALLERGIES:  has No Known Allergies.  MEDICATIONS:  Current Outpatient Medications  Medication Sig Dispense  Refill   CALCIUM PO Take by mouth.     Cholecalciferol (VITAMIN D) 50 MCG (2000 UT) tablet Take 2,000 Units by mouth daily.     escitalopram (LEXAPRO) 20 MG tablet TAKE 1 TABLET(20 MG) BY MOUTH DAILY 90 tablet 1   ibuprofen (ADVIL) 200 MG tablet Take 600 mg by mouth every 6 (six) hours as needed for headache or mild pain.     tamoxifen (NOLVADEX) 20 MG tablet TAKE 1 TABLET(20 MG) BY MOUTH DAILY 90 tablet 1   Zoledronic Acid (ZOMETA IV) Inject into the vein. INFUSION EVERY 6 MONTHS FOR BONES AFTER CHEMO FOR BREAST CANCER     No current facility-administered medications for this visit.    PHYSICAL EXAMINATION: ECOG PERFORMANCE STATUS: 0 - Asymptomatic  No exam    LABORATORY DATA:  I have reviewed the data as listed    Latest Ref Rng & Units 09/04/2022    9:13 AM 03/06/2022    1:30 PM 10/21/2021    8:00 PM  CBC  WBC 4.0 - 10.5 K/uL 5.3  7.2  7.8   Hemoglobin 12.0 - 15.0 g/dL 29.5  62.1  30.8   Hematocrit 36.0 - 46.0 % 42.1  42.8  41.9   Platelets 150 - 400 K/uL 212  219  228          Latest Ref Rng & Units 09/04/2022    9:13 AM 03/06/2022    1:30 PM 10/21/2021    8:00 PM  CMP  Glucose 70 - 99 mg/dL 657  846  962   BUN 6 - 20 mg/dL 14  16  14    Creatinine 0.44 - 1.00 mg/dL 9.52  8.41  3.24   Sodium 135 - 145 mmol/L 141  139  138   Potassium 3.5 - 5.1 mmol/L 3.6  4.2  3.9   Chloride 98 - 111 mmol/L 108  104  104   CO2 22 - 32 mmol/L 24  28  26    Calcium 8.9 - 10.3 mg/dL 8.6  9.6  8.8   Total Protein 6.5 - 8.1 g/dL 6.8  6.8    Total Bilirubin 0.3 - 1.2 mg/dL 0.4  0.3    Alkaline Phos 38 - 126 U/L 36  47    AST 15 - 41 U/L 21  13    ALT 0 - 44 U/L 17  8        RADIOGRAPHIC STUDIES: I have personally reviewed the radiological images as listed and agreed with the findings in the report. No results found.    Orders Placed This Encounter  Procedures   MM 3D SCREENING MAMMOGRAM BILATERAL BREAST    Standing Status:   Future    Standing Expiration Date:   09/11/2023    Order Specific Question:   Reason for Exam (SYMPTOM  OR DIAGNOSIS REQUIRED)    Answer:   screening    Order Specific Question:   Is the patient pregnant?    Answer:   No    Order Specific Question:   Preferred imaging location?    Answer:   [facility]   I discussed the assessment and treatment plan with the patient. The patient was provided an opportunity to ask questions and all were answered. The patient agreed with the plan and demonstrated an understanding of the instructions.   The patient was advised to call back or seek an in-person evaluation if the symptoms worsen or if the condition fails to improve as anticipated.  I provided 22  minutes of non face-to-face telephone visit time during this encounter, and > 50% was spent counseling as documented under my assessment & plan.   [name], MD 09/11/2022

## 2022-09-11 ENCOUNTER — Inpatient Hospital Stay (HOSPITAL_BASED_OUTPATIENT_CLINIC_OR_DEPARTMENT_OTHER): Payer: BC Managed Care – PPO | Admitting: Hematology

## 2022-09-11 ENCOUNTER — Ambulatory Visit (HOSPITAL_COMMUNITY): Payer: BC Managed Care – PPO

## 2022-09-11 ENCOUNTER — Encounter: Payer: Self-pay | Admitting: Hematology

## 2022-09-11 DIAGNOSIS — Z1379 Encounter for other screening for genetic and chromosomal anomalies: Secondary | ICD-10-CM

## 2022-09-11 DIAGNOSIS — M8580 Other specified disorders of bone density and structure, unspecified site: Secondary | ICD-10-CM

## 2022-09-11 DIAGNOSIS — Z1231 Encounter for screening mammogram for malignant neoplasm of breast: Secondary | ICD-10-CM | POA: Diagnosis not present

## 2022-09-11 DIAGNOSIS — C50411 Malignant neoplasm of upper-outer quadrant of right female breast: Secondary | ICD-10-CM | POA: Diagnosis not present

## 2022-09-11 DIAGNOSIS — Z170 Estrogen receptor positive status [ER+]: Secondary | ICD-10-CM

## 2022-09-11 MED ORDER — TAMOXIFEN CITRATE 20 MG PO TABS: ORAL_TABLET | ORAL | 1 refills | Status: DC

## 2022-09-11 MED ORDER — ESCITALOPRAM OXALATE 20 MG PO TABS: ORAL_TABLET | ORAL | 1 refills | Status: DC

## 2022-09-18 DIAGNOSIS — Z7189 Other specified counseling: Secondary | ICD-10-CM | POA: Diagnosis not present

## 2022-09-18 DIAGNOSIS — Z1283 Encounter for screening for malignant neoplasm of skin: Secondary | ICD-10-CM | POA: Diagnosis not present

## 2022-09-18 DIAGNOSIS — L820 Inflamed seborrheic keratosis: Secondary | ICD-10-CM | POA: Diagnosis not present

## 2022-09-18 DIAGNOSIS — D485 Neoplasm of uncertain behavior of skin: Secondary | ICD-10-CM | POA: Diagnosis not present

## 2022-10-06 ENCOUNTER — Encounter: Payer: Self-pay | Admitting: Hematology

## 2022-12-11 ENCOUNTER — Ambulatory Visit
Admission: RE | Admit: 2022-12-11 | Discharge: 2022-12-11 | Disposition: A | Discharge status: Home / Self Care | Payer: BC Managed Care – PPO | Source: Ambulatory Visit | Attending: Hematology | Admitting: Hematology

## 2022-12-11 ENCOUNTER — Ambulatory Visit: Payer: BC Managed Care – PPO

## 2022-12-11 DIAGNOSIS — Z1231 Encounter for screening mammogram for malignant neoplasm of breast: Secondary | ICD-10-CM

## 2023-01-08 ENCOUNTER — Ambulatory Visit (INDEPENDENT_AMBULATORY_CARE_PROVIDER_SITE_OTHER): Payer: BC Managed Care – PPO | Admitting: Obstetrics and Gynecology

## 2023-01-08 ENCOUNTER — Other Ambulatory Visit: Payer: Self-pay

## 2023-01-08 ENCOUNTER — Encounter: Payer: Self-pay | Admitting: Obstetrics and Gynecology

## 2023-01-08 VITALS — BP 129/82 | HR 98 | Wt 185.2 lb

## 2023-01-08 DIAGNOSIS — H43819 Vitreous degeneration, unspecified eye: Secondary | ICD-10-CM

## 2023-01-08 DIAGNOSIS — D249 Benign neoplasm of unspecified breast: Secondary | ICD-10-CM

## 2023-01-08 DIAGNOSIS — H5310 Unspecified subjective visual disturbances: Secondary | ICD-10-CM

## 2023-01-08 DIAGNOSIS — Z01419 Encounter for gynecological examination (general) (routine) without abnormal findings: Secondary | ICD-10-CM

## 2023-01-08 DIAGNOSIS — R059 Cough, unspecified: Secondary | ICD-10-CM

## 2023-01-08 DIAGNOSIS — J029 Acute pharyngitis, unspecified: Secondary | ICD-10-CM

## 2023-01-08 DIAGNOSIS — Z1331 Encounter for screening for depression: Secondary | ICD-10-CM | POA: Diagnosis not present

## 2023-01-08 DIAGNOSIS — D126 Benign neoplasm of colon, unspecified: Secondary | ICD-10-CM | POA: Insufficient documentation

## 2023-01-08 DIAGNOSIS — F17200 Nicotine dependence, unspecified, uncomplicated: Secondary | ICD-10-CM

## 2023-01-08 DIAGNOSIS — N63 Unspecified lump in unspecified breast: Secondary | ICD-10-CM | POA: Insufficient documentation

## 2023-01-08 DIAGNOSIS — H2510 Age-related nuclear cataract, unspecified eye: Secondary | ICD-10-CM

## 2023-01-08 HISTORY — DX: Cough, unspecified: R05.9

## 2023-01-08 HISTORY — DX: Acute pharyngitis, unspecified: J02.9

## 2023-01-08 HISTORY — DX: Benign neoplasm of colon, unspecified: D12.6

## 2023-01-08 HISTORY — DX: Age-related nuclear cataract, unspecified eye: H25.10

## 2023-01-08 HISTORY — DX: Benign neoplasm of unspecified breast: D24.9

## 2023-01-08 HISTORY — DX: Unspecified lump in unspecified breast: N63.0

## 2023-01-08 HISTORY — DX: Nicotine dependence, unspecified, uncomplicated: F17.200

## 2023-01-08 HISTORY — DX: Unspecified subjective visual disturbances: H53.10

## 2023-01-08 HISTORY — DX: Vitreous degeneration, unspecified eye: H43.819

## 2023-01-08 NOTE — Progress Notes (Signed)
ANNUAL EXAM Patient name: Abigail Berg MRN [id number]  Date of birth: [birth year] Chief Complaint:   Gynecologic Exam  History of Present Illness:   Abigail Berg is a 57 y.o. G1P1001 being seen today for a routine annual exam.  Current complaints: doing well   Menstrual concerns? No   Breast or nipple changes? On tamoxifen Contraception use? No   No LMP recorded.   The pregnancy intention screening data noted above was reviewed. Potential methods of contraception were discussed. The patient elected to proceed with No data recorded.   Last pap     Component Value Date/Time   DIAGPAP  09/12/2021 1348    - Negative for intraepithelial lesion or malignancy (NILM)   HPVHIGH Negative 09/12/2021 1348   ADEQPAP  09/12/2021 1348    Satisfactory for evaluation. The presence or absence of an   ADEQPAP  09/12/2021 1348    endocervical/transformation zone component cannot be determined because   ADEQPAP of atrophy. 09/12/2021 1348     H/O abnormal pap: no Last mammogram: 12/2022 [facility] 1.  Last colonoscopy: 2022.      01/08/2023    3:32 PM 09/12/2021    1:45 PM  Depression screen PHQ 2/9  Decreased Interest 0 0  Down, Depressed, Hopeless 0 0  PHQ - 2 Score 0 0  Altered sleeping 2 1  Tired, decreased energy 1 1  Change in appetite 0 0  Feeling bad or failure about yourself  0 0  Trouble concentrating 0 0  Moving slowly or fidgety/restless 0 0  Suicidal thoughts 0 0  PHQ-9 Score 3 2        01/08/2023    3:32 PM 09/12/2021    1:45 PM  GAD 7 : Generalized Anxiety Score  Nervous, Anxious, on Edge 0 1  Control/stop worrying 0 0  Worry too much - different things 0 0  Trouble relaxing 0 0  Restless 0 0  Easily annoyed or irritable 0 0  Afraid - awful might happen 0 0  Total GAD 7 Score 0 1     Review of Systems:   Pertinent items are noted in HPI Denies any headaches, blurred vision, fatigue, shortness of breath, chest pain, abdominal pain, abnormal vaginal  discharge/itching/odor/irritation, problems with periods, bowel movements, urination, or intercourse unless otherwise stated above. Pertinent History Reviewed:  Reviewed past medical,surgical, social and family history.  Reviewed problem list, medications and allergies. Physical Assessment:   Vitals:   01/08/23 1509  BP: 129/82  Pulse: 98  Weight: 185 lb 3.2 oz (84 kg)  Body mass index is 27.35 kg/m.        Physical Examination:   General appearance - well appearing, and in no distress  Mental status - alert, oriented to person, place, and time  Psych:  She has a normal mood and affect  Skin - warm and dry, normal color, no suspicious lesions noted  Chest - effort normal, all lung fields clear to auscultation bilaterally  Heart - normal rate and regular rhythm  Abdomen - soft, nontender, nondistended, no masses or organomegaly  Pelvic - DEFERRED  Extremities:  No swelling or varicosities noted  Chaperone present for exam  No results found for this or any previous visit (from the past 24 hour(s)).    Assessment & Plan:  1. Well woman exam with routine gynecological exam - Cervical cancer screening: Discussed guidelines. Pap with HPV up to date - Breast Health: Encouraged self breast awareness/SBE. Discussed limits of clinical  breast exam for detecting breast cancer. Discussed importance of annual MXR.  Follows with oncologist - Climacteric/Sexual health: Reviewed typical and atypical symptoms of menopause/peri-menopause. Discussed PMB and to call if any amount of spotting.  - Colonoscopy: up to date - F/U for next pap; can age out at that time  No orders of the defined types were placed in this encounter.   Meds: No orders of the defined types were placed in this encounter.   Follow-up: No follow-ups on file.  Lorriane Shire, MD 01/09/2023 1:53 PM

## 2023-01-20 DIAGNOSIS — L820 Inflamed seborrheic keratosis: Secondary | ICD-10-CM | POA: Diagnosis not present

## 2023-01-20 DIAGNOSIS — L299 Pruritus, unspecified: Secondary | ICD-10-CM | POA: Diagnosis not present

## 2023-01-20 DIAGNOSIS — Z1283 Encounter for screening for malignant neoplasm of skin: Secondary | ICD-10-CM | POA: Diagnosis not present

## 2023-01-20 DIAGNOSIS — D2339 Other benign neoplasm of skin of other parts of face: Secondary | ICD-10-CM | POA: Diagnosis not present

## 2023-02-03 DIAGNOSIS — R42 Dizziness and giddiness: Secondary | ICD-10-CM | POA: Diagnosis not present

## 2023-03-19 ENCOUNTER — Inpatient Hospital Stay: Payer: BC Managed Care – PPO | Admitting: Hematology

## 2023-03-19 ENCOUNTER — Inpatient Hospital Stay: Payer: BC Managed Care – PPO

## 2023-03-30 ENCOUNTER — Other Ambulatory Visit: Payer: Self-pay | Admitting: Hematology

## 2023-04-27 ENCOUNTER — Other Ambulatory Visit: Payer: Self-pay | Admitting: Hematology

## 2023-04-30 ENCOUNTER — Inpatient Hospital Stay: Payer: BC Managed Care – PPO | Admitting: Hematology

## 2023-04-30 ENCOUNTER — Inpatient Hospital Stay: Payer: BC Managed Care – PPO | Attending: Hematology

## 2023-04-30 ENCOUNTER — Other Ambulatory Visit: Payer: Self-pay

## 2023-04-30 ENCOUNTER — Encounter: Payer: Self-pay | Admitting: Hematology

## 2023-04-30 VITALS — BP 137/88 | HR 75 | Temp 98.3°F | Resp 17 | Wt 182.7 lb

## 2023-04-30 DIAGNOSIS — Z1731 Human epidermal growth factor receptor 2 positive status: Secondary | ICD-10-CM | POA: Diagnosis not present

## 2023-04-30 DIAGNOSIS — Z79810 Long term (current) use of selective estrogen receptor modulators (SERMs): Secondary | ICD-10-CM | POA: Insufficient documentation

## 2023-04-30 DIAGNOSIS — Z1722 Progesterone receptor negative status: Secondary | ICD-10-CM | POA: Diagnosis not present

## 2023-04-30 DIAGNOSIS — Z1501 Genetic susceptibility to malignant neoplasm of breast: Secondary | ICD-10-CM

## 2023-04-30 DIAGNOSIS — C50411 Malignant neoplasm of upper-outer quadrant of right female breast: Secondary | ICD-10-CM | POA: Diagnosis not present

## 2023-04-30 DIAGNOSIS — Z170 Estrogen receptor positive status [ER+]: Secondary | ICD-10-CM | POA: Diagnosis not present

## 2023-04-30 DIAGNOSIS — Z9221 Personal history of antineoplastic chemotherapy: Secondary | ICD-10-CM | POA: Diagnosis not present

## 2023-04-30 DIAGNOSIS — Z923 Personal history of irradiation: Secondary | ICD-10-CM | POA: Diagnosis not present

## 2023-04-30 LAB — CBC WITH DIFFERENTIAL (CANCER CENTER ONLY)
Abs Immature Granulocytes: 0.02 10*3/uL (ref 0.00–0.07)
Basophils Absolute: 0 10*3/uL (ref 0.0–0.1)
Basophils Relative: 1 %
Eosinophils Absolute: 0.1 10*3/uL (ref 0.0–0.5)
Eosinophils Relative: 1 %
HCT: 43.3 % (ref 36.0–46.0)
Hemoglobin: 15 g/dL (ref 12.0–15.0)
Immature Granulocytes: 0 %
Lymphocytes Relative: 30 %
Lymphs Abs: 1.9 10*3/uL (ref 0.7–4.0)
MCH: 30.9 pg (ref 26.0–34.0)
MCHC: 34.6 g/dL (ref 30.0–36.0)
MCV: 89.1 fL (ref 80.0–100.0)
Monocytes Absolute: 0.4 10*3/uL (ref 0.1–1.0)
Monocytes Relative: 6 %
Neutro Abs: 3.9 10*3/uL (ref 1.7–7.7)
Neutrophils Relative %: 62 %
Platelet Count: 230 10*3/uL (ref 150–400)
RBC: 4.86 MIL/uL (ref 3.87–5.11)
RDW: 13.7 % (ref 11.5–15.5)
WBC Count: 6.4 10*3/uL (ref 4.0–10.5)
nRBC: 0 % (ref 0.0–0.2)

## 2023-04-30 LAB — CMP (CANCER CENTER ONLY)
ALT: 10 U/L (ref 0–44)
AST: 16 U/L (ref 15–41)
Albumin: 4.3 g/dL (ref 3.5–5.0)
Alkaline Phosphatase: 45 U/L (ref 38–126)
Anion gap: 4 — ABNORMAL LOW (ref 5–15)
BUN: 10 mg/dL (ref 6–20)
CO2: 29 mmol/L (ref 22–32)
Calcium: 9.7 mg/dL (ref 8.9–10.3)
Chloride: 106 mmol/L (ref 98–111)
Creatinine: 0.87 mg/dL (ref 0.44–1.00)
GFR, Estimated: >60 mL/min (ref >60)
Glucose, Bld: 118 mg/dL — ABNORMAL HIGH (ref 70–99)
Potassium: 4.3 mmol/L (ref 3.5–5.1)
Sodium: 139 mmol/L (ref 135–145)
Total Bilirubin: 0.3 mg/dL (ref 0.0–1.2)
Total Protein: 7.3 g/dL (ref 6.5–8.1)

## 2023-04-30 NOTE — Progress Notes (Signed)
 [facility]   [phone] [phone]   Clinic Follow up Note   Patient Care Team: Patient, [name] as PCP - General (General Practice) Rollene Rotunda, MD as PCP - Cardiology (Cardiology) Armbruster, Willaim Rayas, MD as Consulting Physician (Gastroenterology) Minerva Areola as Counselor (Licensed Clinical Social Worker) [name], MD as Consulting Physician (Hematology) Pollyann Samples, NP as Nurse Practitioner (Nurse Practitioner) Pershing Proud, RN as Oncology Nurse Navigator Donnelly Angelica, RN as Oncology Nurse Navigator Emelia Loron, MD as Consulting Physician (General Surgery)  Date of Service:  04/30/2023  CHIEF COMPLAINT: f/u of breat cancer   CURRENT THERAPY:  Tamoxifen 20 mg daily  Oncology History   Malignant neoplasm of upper-outer quadrant of right breast in female, estrogen receptor positive (HCC) Stage 1A, p(T1cN0M0), stage IA, ER+/PR-/HER2+, Grade 3.  -diagnosed in 05/2019. S/p right breast lumpectomy, weekly Abraxane and trastuzumab 07/29/19 - 10/12/19, adjuvant radiation, and maintenance trastuzumab [name]) q3weeks 11/03/19 - 07/12/20. -She started adjuvant tamoxifen in 11/2019, tolerating well overall with mild hot flashes. Her last period was 02/2019, prior to chemo. Last [facility] 11/22/20 showed she is still perimenopausal.  -most recent screening MRI on 05/16/21 and mammogram on 11/22/20 were benign. -Due to abnormal screening mammogram in November 2023, she underwent a biopsy of left breast which showed complex sclerosing lesion, she underwent surgical biopsy on February 06, 2022 and surgical path was benign without malignancies or high risk features. -Reviewed CT scan from September 04, 2022 showed stable 5 mm lung nodule, postradiation change, no evidence of cancer recurrence. -She is clinically doing well, will continue tamoxifen and cancer surveillance.   Assessment and Plan    Breast Cancer Follow-up for breast cancer  diagnosed in April 2021. She is currently on tamoxifen for 3.5 years, with plans to continue for a total of 10 years. She reports no significant side effects from tamoxifen, such as hot flashes. Blood work, including blood counts, kidney, and liver functions, are normal. Last mammogram in November was normal. 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 Tamoxifen aids bone density but carries a small risk of thromboembolism and less than 1% risk of endometrial cancer, especially in older age. She is advised to stay active and monitor for abnormal bleeding. - Order breast MRI for May - Continue tamoxifen therapy - Order mammogram in six months - Advise staying active and using compression stockings during long travel to prevent thromboembolism - Monitor for any abnormal uterine bleeding  Osteopenia Osteopenia is present, and tamoxifen is noted to aid bone density. She is advised to stay active to mitigate risks associated with tamoxifen, such as thromboembolism. - Continue tamoxifen therapy - Advise staying active and using compression stockings during long travel to prevent thromboembolism  Lung Nodule Lung nodule with well-managed findings on CT since August last year. She has undergone 2-3 CT scans since July 2022. The nodule is well-managed, and she is a smoker, though she has reduced smoking. Smoking cessation is advised to reduce lung cancer risk. - No further CT scans needed this year - Encourage smoking cessation to reduce lung cancer risk  Post-Surgical Nerve Pain Residual tenderness and nerve pain at the site of previous parathyroid surgery. The incision is healed well with no lumps or significant tenderness.  Follow-up She is doing well overall with no new significant issues. Follow-up is planned to monitor ongoing conditions and treatment efficacy. - Schedule follow-up appointment in six months - Ensure  MRI is scheduled for early May - Order  mammogram at next follow-up    -Continue tamoxifen     SUMMARY OF ONCOLOGIC HISTORY: Oncology History Overview Note  Cancer Staging Malignant neoplasm of upper-outer quadrant of right breast in female, estrogen receptor positive (HCC) Staging form: Breast, AJCC 8th Edition - Clinical stage from 06/01/2019: Stage IA (cT1c, cN0, cM0, G3, ER+, PR-, HER2+) - Signed by [name], MD on 06/17/2019 - Pathologic stage from 06/30/2019: Stage IA (pT1c, pN0, cM0, G3, ER+, PR-, HER2+) - Signed by [name], MD on 07/15/2019    Malignant neoplasm of upper-outer quadrant of right breast in female, estrogen receptor positive (HCC)  10/2018 - 06/17/2019 Anti-estrogen oral therapy   She was preventively started on Tamoxifen since 10/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 Held after 06/17/19 to proceed with her breast cancer surgery and chemo.    05/06/2019 Breast MRI   IMPRESSION Enhancing right breast mass suspicious for malignancy. Second look [place] if recommended.  No Suspicious enhancing left breast masses.    05/24/2019 Breast US   IMPRESSION 1.8cm irregular right breast mass within the 9:30 position 3.5cm from the nipple is highly suggestive of malignancy. An Ultrasound guided breast biopsy is recommended. The findings of the study and recommendation for biopsy were discussed with the patient directly.    06/01/2019 Cancer Staging   Staging form: Breast, AJCC 8th Edition - Clinical stage from 06/01/2019: Stage IA (cT1c, cN0, cM0, G3, ER+, PR-, HER2+) - Signed by [name], MD on 06/17/2019   06/01/2019 Initial Biopsy   Breast Biopsy - Right Breast Core Bx DIAGNOSIS INFILTRATING DUCT CARCINOMA WITH MINOR DUCTAL CARCINOMA IN SITU COMPNENT OF RIGHT BREAST, CORE NEEDLE BIOPSY    06/01/2019 Receptors her2   ER: greater than 75% of tumor cells show moderate staining  PR: Negative  HER2: Positive 3+ Ki67: 40%   06/17/2019 Initial Diagnosis   Malignant neoplasm of upper-outer quadrant of right breast in female,  estrogen receptor positive (HCC)   06/30/2019 Surgery   RIGHT BREAST LUMPECTOMY WITH RADIOACTIVE SEED AND RIGHT AXILLARY SENTINEL NODE BIOPSY and PAC placement  by Dr Dwain Sarna     06/30/2019 Pathology Results   FINAL MICROSCOPIC DIAGNOSIS:   A. BREAST, RIGHT, LUMPECTOMY:  - Invasive ductal carcinoma, grade 3, spanning 1.5 cm.  - Intermediate grade ductal carcinoma in situ.  - Invasive carcinoma is 0.1-0.2 from the final lateral margin (part E).  - Margins are negative for in situ carcinoma.  - Biopsy site.  - See oncology table.   B. LYMPH NODE, RIGHT AXILLARY, SENTINEL, BIOPSY:  - One of one lymph nodes negative for carcinoma (0/1).   C. LYMPH NODE, RIGHT AXILLARY, SENTINEL, BIOPSY:  - One of one lymph nodes negative for carcinoma (0/1).   D. LYMPH NODE, RIGHT AXILLARY, SENTINEL, BIOPSY:  - One of one lymph nodes negative for carcinoma (0/1).   E. BREAST, RIGHT ADDITIONAL LATERAL MARGIN, EXCISION:  - Invasive ductal carcinoma.  - Invasive carcinoma is 0.1-0.2 cm from the new lateral margin.   F. BREAST, RIGHT ADDITIONAL POSTERIOR MARGIN, EXCISION:  - Fibrocystic change and usual ductal hyperplasia.  - No malignancy identified.   G. BREAST, RIGHT ADDITIONAL SUPERIOR MARGIN, EXCISION:  - Fibrocystic change and usual ductal hyperplasia.  - Incidental radial scar.  - No malignancy identified.     PROGNOSTIC INDICATOR RESULTS:  The tumor cells are POSITIVE for Her2 (3+).  Estrogen Receptor:       POSITIVE, 85%, MODERATE STAINING  Progesterone Receptor:  NEGATIVE  Proliferation Marker Ki-67:   25%    06/30/2019 Cancer Staging   Staging form: Breast, AJCC 8th Edition - Pathologic stage from 06/30/2019: Stage IA (pT1c, pN0, cM0, G3, ER+, PR-, HER2+) - Signed by [name], MD on 07/15/2019   07/29/2019 - 07/12/2020 Chemotherapy   Adjuvant Weekly Abraxane (Due to her insurance denial of Abraxane, I changed it to paclitaxel starting 09/15/19) and Transtuzumab for 12 weeks starting  07/29/19-10/12/19, followed by maintenance trastuzumab q3weeks starting 11/03/19 to complete 1 year of treatment. Due to headaches her trastuzumab (Ogivr) was switched to trastuzumab [name]) on 09/08/19.     11/24/2019 -  Anti-estrogen oral therapy   Tamoxifen   12/2019 - 01/2020 Radiation Therapy   Outside Radiation in late 2021.       Discussed the use of AI scribe software for clinical note transcription with the patient, who gave verbal consent to proceed.  History of Present Illness   The patient, a 59 year old female with a history of breast cancer, presents for a routine follow-up. She reports feeling well overall, with the exception of persistent numbness in her feet, likely a residual effect from previous chemotherapy. Her hands are unaffected. She is currently on tamoxifen, which she tolerates well, with no significant side effects. She reports that initial hot flashes have subsided and her body seems to have adjusted to the medication. She also has a history of osteopenia, which is being managed with tamoxifen. She has a genetic mutation (BRCA2) that increases her risk for breast cancer. She also has a history of a lung nodule, which has been stable for the past two years. She admits to smoking, but has cut back significantly. She also has a history of a parathyroid surgery for calcium management.         All other systems were reviewed with the patient and are negative.  MEDICAL HISTORY:  Past Medical History:  Diagnosis Date   Benign neoplasm of breast 01/08/2023   Benign neoplasm of colon 01/08/2023   Breast lump 01/08/2023   Cancer (HCC) 06/30/2019   right breast IDC w/ rad w/chemo   Cough 01/08/2023   Depression    Eye strain 01/08/2023   Family history of colon cancer    MAT PARENTS ) MOM AND DAD), MAT COUSIN- DIED AGE 46 FROM COLON CANCER   Fever 03/07/2020   History of chemotherapy    COMPLETED 10-2019- COMPLETED HERCEPTIN 06-2020   History of kidney stones     History of radiation therapy    COMPLETED 12-2019   Monoallelic mutation of CHEK2 gene in female patient    Nasal congestion 03/07/2020   Neuromuscular disorder (HCC)    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   Nuclear sclerosis 01/08/2023   Personal history of colonic polyps    Pharyngitis 01/08/2023   Pneumonia    Posterior vitreous detachment 01/08/2023   S/P partial colectomy 2017   Sneezing 03/07/2020   Sore throat 03/07/2020   Tobacco dependence syndrome 01/08/2023    SURGICAL HISTORY: Past Surgical History:  Procedure Laterality Date   BREAST BIOPSY Left 12/19/2021   MM LT BREAST BX W LOC DEV 1ST LESION IMAGE BX SPEC STEREO GUIDE 12/19/2021 [facility]   BREAST BIOPSY  02/05/2022   MM LT RADIOACTIVE SEED LOC MAMMO GUIDE 02/05/2022 [facility]   BREAST LUMPECTOMY Right 06/30/2019   right breast lumpectomy IDC w/ rad w/chemo   BREAST LUMPECTOMY WITH RADIOACTIVE SEED AND SENTINEL LYMPH NODE BIOPSY Right 06/30/2019   Procedure: RIGHT BREAST  LUMPECTOMY WITH RADIOACTIVE SEED AND RIGHT AXILLARY SENTINEL NODE BIOPSY;  Surgeon: Emelia Loron, MD;  Location: [facility];  Service: General;  Laterality: Right;  PEC BLOCK   BREAST SURGERY     COLON SURGERY  2017   Partial colectomy   COLONOSCOPY     PARATHYROIDECTOMY Left 01/25/2020   Procedure: LEFT INFERIOR PARATHYROIDECTOMY;  Surgeon: Darnell Level, MD;  Location: [facility];  Service: General;  Laterality: Left;   POLYPECTOMY     PORT-A-CATH REMOVAL Left 10/24/2020   Procedure: REMOVAL PORT-A-CATH;  Surgeon: Emelia Loron, MD;  Location: [facility];  Service: General;  Laterality: Left;   PORTACATH PLACEMENT Left 06/30/2019   Procedure: INSERTION PORT-A-CATH WITH ULTRASOUND GUIDANCE;  Surgeon: Emelia Loron, MD;  Location: [facility];  Service: General;  Laterality: Left;   RADIOACTIVE SEED GUIDED EXCISIONAL BREAST BIOPSY Left 02/06/2022   Procedure: RADIOACTIVE SEED GUIDED  EXCISIONAL LEFT BREAST BIOPSY;  Surgeon: Emelia Loron, MD;  Location: [facility];  Service: General;  Laterality: Left;   VENTRAL HERNIA REPAIR N/A 01/25/2020   Procedure: HERNIA REPAIR VENTRAL ADULT WITH MESH;  Surgeon: Darnell Level, MD;  Location: [facility];  Service: General;  Laterality: N/A;    I have reviewed the social history and family history with the patient and they are unchanged from previous note.  ALLERGIES:  has no known allergies.  MEDICATIONS:  Current Outpatient Medications  Medication Sig Dispense Refill   CALCIUM PO Take by mouth.     Cholecalciferol (VITAMIN D) 50 MCG (2000 UT) tablet Take 2,000 Units by mouth daily.     escitalopram (LEXAPRO) 20 MG tablet TAKE 1 TABLET(20 MG) BY MOUTH DAILY 90 tablet 1   ibuprofen (ADVIL) 200 MG tablet Take 600 mg by mouth every 6 (six) hours as needed for headache or mild pain.     tamoxifen (NOLVADEX) 20 MG tablet TAKE 1 TABLET(20 MG) BY MOUTH DAILY 90 tablet 1   No current facility-administered medications for this visit.    PHYSICAL EXAMINATION: ECOG PERFORMANCE STATUS: 0 - Asymptomatic  Vitals:   04/30/23 1329  BP: 137/88  Pulse: 75  Resp: 17  Temp: 98.3 F (36.8 C)  SpO2: 100%   Wt Readings from Last 3 Encounters:  04/30/23 182 lb 11.2 oz (82.9 kg)  01/08/23 185 lb 3.2 oz (84 kg)  03/06/22 184 lb (83.5 kg)     GENERAL:alert, no distress and comfortable SKIN: skin color, texture, turgor are normal, no rashes or significant lesions EYES: normal, Conjunctiva are pink and non-injected, sclera clear NECK: supple, thyroid normal size, non-tender, without nodularity LYMPH:  no palpable lymphadenopathy in the cervical, axillary  LUNGS: clear to auscultation and percussion with normal breathing effort HEART: regular rate & rhythm and no murmurs and no lower extremity edema ABDOMEN:abdomen soft, non-tender and normal bowel sounds Musculoskeletal:no cyanosis of digits and no clubbing  NEURO: alert &  oriented x 3 with fluent speech, no focal motor/sensory deficits Breasts: Breast inspection showed them to be symmetrical with no nipple discharge. Palpation of the breasts and axilla revealed no obvious mass that I could appreciate.   LABORATORY DATA:  I have reviewed the data as listed    Latest Ref Rng & Units 04/30/2023   12:58 PM 09/04/2022    9:13 AM 03/06/2022    1:30 PM  CBC  WBC 4.0 - 10.5 K/uL 6.4  5.3  7.2   Hemoglobin 12.0 - 15.0 g/dL 84.6  96.2  95.2  Hematocrit 36.0 - 46.0 % 43.3  42.1  42.8   Platelets 150 - 400 K/uL 230  212  219         Latest Ref Rng & Units 04/30/2023   12:58 PM 09/04/2022    9:13 AM 03/06/2022    1:30 PM  CMP  Glucose 70 - 99 mg/dL 161  096  045   BUN 6 - 20 mg/dL 10  14  16    Creatinine 0.44 - 1.00 mg/dL 4.09  8.11  9.14   Sodium 135 - 145 mmol/L 139  141  139   Potassium 3.5 - 5.1 mmol/L 4.3  3.6  4.2   Chloride 98 - 111 mmol/L 106  108  104   CO2 22 - 32 mmol/L 29  24  28    Calcium 8.9 - 10.3 mg/dL 9.7  8.6  9.6   Total Protein 6.5 - 8.1 g/dL 7.3  6.8  6.8   Total Bilirubin 0.0 - 1.2 mg/dL 0.3  0.4  0.3   Alkaline Phos 38 - 126 U/L 45  36  47   AST 15 - 41 U/L 16  21  13    ALT 0 - 44 U/L 10  17  8        RADIOGRAPHIC STUDIES: I have personally reviewed the radiological images as listed and agreed with the findings in the report. No results found.    Orders Placed This Encounter  Procedures   MR BREAST BILATERAL W WO CONTRAST INC CAD    Standing Status:   Future    Expected Date:   06/08/2023    Expiration Date:   04/29/2024    If indicated for the ordered procedure, I authorize the administration of contrast media per Radiology protocol:   Yes    What is the patient's sedation requirement?:   No Sedation    Does the patient have a pacemaker or implanted devices?:   No    Radiology Contrast Protocol - do NOT remove file path:   [email or web address]    Preferred imaging location?:   [facility] (table limit-550lbs)   All questions were answered. The patient knows to call the clinic with any problems, questions or concerns. No barriers to learning was detected. The total time spent in the appointment was 25 minutes.     [name], MD 04/30/2023

## 2023-04-30 NOTE — Assessment & Plan Note (Signed)
Stage 1A, p(T1cN0M0), stage IA, ER+/PR-/HER2+, Grade 3.  -diagnosed in 05/2019. S/p right breast lumpectomy, weekly Abraxane and trastuzumab 07/29/19 - 10/12/19, adjuvant radiation, and maintenance trastuzumab [facility]) q3weeks 11/03/19 - 07/12/20. -She started adjuvant tamoxifen in 11/2019, tolerating well overall with mild hot flashes. Her last period was 02/2019, prior to chemo. Last [facility] 11/22/20 showed she is still perimenopausal.  -most recent screening MRI on 05/16/21 and mammogram on 11/22/20 were benign. -Due to abnormal screening mammogram in November 2023, she underwent a biopsy of left breast which showed complex sclerosing lesion, she underwent surgical biopsy on February 06, 2022 and surgical path was benign without malignancies or high risk features. -Reviewed CT scan from September 04, 2022 showed stable 5 mm lung nodule, postradiation change, no evidence of cancer recurrence. -She is clinically doing well, will continue tamoxifen and cancer surveillance.

## 2023-06-11 ENCOUNTER — Ambulatory Visit
Admission: RE | Admit: 2023-06-11 | Discharge: 2023-06-11 | Disposition: A | Discharge status: Home / Self Care | Source: Ambulatory Visit | Attending: Hematology | Admitting: Hematology

## 2023-06-11 DIAGNOSIS — Z170 Estrogen receptor positive status [ER+]: Secondary | ICD-10-CM

## 2023-06-11 DIAGNOSIS — Z1239 Encounter for other screening for malignant neoplasm of breast: Secondary | ICD-10-CM | POA: Diagnosis not present

## 2023-06-11 DIAGNOSIS — Z853 Personal history of malignant neoplasm of breast: Secondary | ICD-10-CM | POA: Diagnosis not present

## 2023-06-11 MED ORDER — GADOPICLENOL 0.5 MMOL/ML IV SOLN
9.0000 mL | Freq: Once | INTRAVENOUS | Status: AC | PRN
Start: 2023-06-11 — End: 2023-06-11
  Administered 2023-06-11: 9 mL via INTRAVENOUS

## 2023-06-12 ENCOUNTER — Encounter: Payer: Self-pay | Admitting: Hematology

## 2023-09-01 ENCOUNTER — Encounter: Payer: Self-pay | Admitting: Gastroenterology

## 2023-09-28 ENCOUNTER — Other Ambulatory Visit: Payer: Self-pay | Admitting: Nurse Practitioner

## 2023-10-01 ENCOUNTER — Other Ambulatory Visit: Payer: Self-pay

## 2023-10-01 ENCOUNTER — Encounter: Payer: Self-pay | Admitting: Gastroenterology

## 2023-10-01 ENCOUNTER — Ambulatory Visit (AMBULATORY_SURGERY_CENTER)

## 2023-10-01 VITALS — Ht 68.0 in | Wt 170.0 lb

## 2023-10-01 DIAGNOSIS — Z860100 Personal history of colon polyps, unspecified: Secondary | ICD-10-CM

## 2023-10-01 DIAGNOSIS — Z800 Family history of malignant neoplasm of digestive organs: Secondary | ICD-10-CM

## 2023-10-01 MED ORDER — SUTAB 1479-225-188 MG PO TABS: ORAL_TABLET | ORAL | 0 refills | Status: DC

## 2023-10-01 MED ORDER — NA SULFATE-K SULFATE-MG SULF 17.5-3.13-1.6 GM/177ML PO SOLN: 1.0000 | Freq: Once | ORAL | 0 refills | Status: DC

## 2023-10-01 NOTE — Progress Notes (Signed)
 Patient requested Sutab  in place of Suprep.  Per prior protocol, RN added 1 week of Miralax to patient prep regimen prior to clear liquid day and start of taking Sutab .

## 2023-10-01 NOTE — Progress Notes (Signed)
 No issues known to pt with past sedation with any surgeries or procedures Patient denies ever being told they had issues or difficulty with intubation  No FH of Malignant Hyperthermia Pt is not on diet pills nor GLP-1 medications Pt is not on home 02  Pt is not on blood thinners  Pt denies issues with chronic constipation  No A fib or A flutter Have any cardiac testing pending--no Pt instructed to use [email or web address] or GoodRx for a price reduction on prep  Ambulates independently Patient stated understanding of clear liquid day and end time for anything in the mouth

## 2023-10-15 ENCOUNTER — Ambulatory Visit: Admitting: Gastroenterology

## 2023-10-15 ENCOUNTER — Encounter: Payer: Self-pay | Admitting: Gastroenterology

## 2023-10-15 VITALS — BP 121/81 | HR 67 | Temp 98.1°F | Resp 18 | Ht 69.0 in | Wt 170.0 lb

## 2023-10-15 DIAGNOSIS — Z1211 Encounter for screening for malignant neoplasm of colon: Secondary | ICD-10-CM | POA: Diagnosis not present

## 2023-10-15 DIAGNOSIS — K5730 Diverticulosis of large intestine without perforation or abscess without bleeding: Secondary | ICD-10-CM

## 2023-10-15 DIAGNOSIS — D125 Benign neoplasm of sigmoid colon: Secondary | ICD-10-CM

## 2023-10-15 DIAGNOSIS — K635 Polyp of colon: Secondary | ICD-10-CM | POA: Diagnosis not present

## 2023-10-15 DIAGNOSIS — Z860100 Personal history of colon polyps, unspecified: Secondary | ICD-10-CM

## 2023-10-15 DIAGNOSIS — K648 Other hemorrhoids: Secondary | ICD-10-CM

## 2023-10-15 DIAGNOSIS — D123 Benign neoplasm of transverse colon: Secondary | ICD-10-CM | POA: Diagnosis not present

## 2023-10-15 DIAGNOSIS — Z860101 Personal history of adenomatous and serrated colon polyps: Secondary | ICD-10-CM | POA: Diagnosis not present

## 2023-10-15 DIAGNOSIS — Z980 Intestinal bypass and anastomosis status: Secondary | ICD-10-CM | POA: Diagnosis not present

## 2023-10-15 DIAGNOSIS — D127 Benign neoplasm of rectosigmoid junction: Secondary | ICD-10-CM

## 2023-10-15 MED ORDER — SODIUM CHLORIDE 0.9 % IV SOLN: 500.0000 mL | Freq: Once | INTRAVENOUS | Status: DC

## 2023-10-15 NOTE — Progress Notes (Signed)
 [facility] History and Physical   Primary Care Physician:  Lanny Callander, MD   Reason for Procedure:   History of colon polyps  Plan:    colonoscopy     HPI: Abigail Berg is a 58 y.o. female  here for colonoscopy surveillance - had 10 poylps removed 07/2018, most adenomas. Last exam 10/2020 - 5 polyps but hyperplastic. SABRA Patient denies any bowel symptoms at this time. H/o Chek 2 gene mutation and multiple second degree relatives with colon cancer.  Otherwise feels well without any cardiopulmonary symptoms.    I have discussed risks / benefits of anesthesia and endoscopic procedure with Aanika Defoor Witczak and they wish to proceed with the exams as outlined today.    Past Medical History:  Diagnosis Date   Benign neoplasm of breast 01/08/2023   Benign neoplasm of colon 01/08/2023   Breast lump 01/08/2023   Cancer (HCC) 06/30/2019   right breast IDC w/ rad w/chemo   Cough 01/08/2023   Depression    Eye strain 01/08/2023   Family history of colon cancer    MAT PARENTS ) MOM AND DAD), MAT COUSIN- DIED AGE 40 FROM COLON CANCER   Fever 03/07/2020   History of chemotherapy    COMPLETED 10-2019- COMPLETED HERCEPTIN  06-2020   History of kidney stones    History of radiation therapy    COMPLETED 12-2019   Monoallelic mutation of CHEK2 gene in female patient    Nasal congestion 03/07/2020   Neuromuscular disorder (HCC)    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   Nuclear sclerosis 01/08/2023   Personal history of colonic polyps    Pharyngitis 01/08/2023   Pneumonia    Posterior vitreous detachment 01/08/2023   S/P partial colectomy 2017   Sneezing 03/07/2020   Sore throat 03/07/2020   Tobacco dependence syndrome 01/08/2023    Past Surgical History:  Procedure Laterality Date   BREAST BIOPSY Left 12/19/2021   MM LT BREAST BX W LOC DEV 1ST LESION IMAGE BX SPEC STEREO GUIDE 12/19/2021 [facility]   BREAST BIOPSY  02/05/2022   MM LT RADIOACTIVE SEED LOC MAMMO GUIDE 02/05/2022 [facility]  MAMMOGRAPHY   BREAST LUMPECTOMY Right 06/30/2019   right breast lumpectomy IDC w/ rad w/chemo   BREAST LUMPECTOMY WITH RADIOACTIVE SEED AND SENTINEL LYMPH NODE BIOPSY Right 06/30/2019   Procedure: RIGHT BREAST LUMPECTOMY WITH RADIOACTIVE SEED AND RIGHT AXILLARY SENTINEL NODE BIOPSY;  Surgeon: Ebbie Cough, MD;  Location: [facility];  Service: General;  Laterality: Right;  PEC BLOCK   BREAST SURGERY     COLON SURGERY  2017   Partial colectomy   COLONOSCOPY     PARATHYROIDECTOMY Left 01/25/2020   Procedure: LEFT INFERIOR PARATHYROIDECTOMY;  Surgeon: Eletha Boas, MD;  Location: [facility];  Service: General;  Laterality: Left;   POLYPECTOMY     PORT-A-CATH REMOVAL Left 10/24/2020   Procedure: REMOVAL PORT-A-CATH;  Surgeon: Ebbie Cough, MD;  Location: [facility];  Service: General;  Laterality: Left;   PORTACATH PLACEMENT Left 06/30/2019   Procedure: INSERTION PORT-A-CATH WITH ULTRASOUND GUIDANCE;  Surgeon: Ebbie Cough, MD;  Location: [facility];  Service: General;  Laterality: Left;   RADIOACTIVE SEED GUIDED EXCISIONAL BREAST BIOPSY Left 02/06/2022   Procedure: RADIOACTIVE SEED GUIDED EXCISIONAL LEFT BREAST BIOPSY;  Surgeon: Ebbie Cough, MD;  Location: [facility];  Service: General;  Laterality: Left;   VENTRAL HERNIA REPAIR N/A 01/25/2020   Procedure: HERNIA REPAIR VENTRAL ADULT WITH MESH;  Surgeon: Eletha Boas, MD;  Location: [facility];  Service: General;  Laterality: N/A;    Prior to Admission medications   Medication Sig Start Date End Date Taking? Authorizing Provider  CALCIUM PO Take by mouth.   Yes [provider]  Cholecalciferol (VITAMIN D) 50 MCG (2000 UT) tablet Take 2,000 Units by mouth daily.   Yes [provider]  escitalopram  (LEXAPRO ) 20 MG tablet TAKE 1 TABLET(20 MG) BY MOUTH DAILY 04/28/23  Yes Boscia, Heather E, NP  tamoxifen  (NOLVADEX ) 20 MG tablet TAKE 1 TABLET BY MOUTH DAILY  09/28/23  Yes Boscia, Heather E, NP  ibuprofen (ADVIL) 200 MG tablet Take 600 mg by mouth every 6 (six) hours as needed for headache or mild pain. Patient not taking: Reported on 10/15/2023    [provider]    Current Outpatient Medications  Medication Sig Dispense Refill   CALCIUM PO Take by mouth.     Cholecalciferol (VITAMIN D) 50 MCG (2000 UT) tablet Take 2,000 Units by mouth daily.     escitalopram  (LEXAPRO ) 20 MG tablet TAKE 1 TABLET(20 MG) BY MOUTH DAILY 90 tablet 1   tamoxifen  (NOLVADEX ) 20 MG tablet TAKE 1 TABLET BY MOUTH DAILY 90 tablet 1   ibuprofen (ADVIL) 200 MG tablet Take 600 mg by mouth every 6 (six) hours as needed for headache or mild pain. (Patient not taking: Reported on 10/15/2023)     Current Facility-Administered Medications  Medication Dose Route Frequency Provider Last Rate Last Admin   0.9 %  sodium chloride  infusion  500 mL Intravenous Once Kimetha Trulson, Elspeth SQUIBB, MD        Allergies as of 10/15/2023   (No Known Allergies)    Family History  Problem Relation Age of Onset   Colon cancer Maternal Grandmother 68   Colon cancer Maternal Grandfather 75   Colon cancer Cousin 50   Cancer Other        breast cancer in MGM's sister    Breast cancer Other        tested BrCA +   Colon polyps Neg Hx    Rectal cancer Neg Hx    Stomach cancer Neg Hx    Esophageal cancer Neg Hx     Social History   Socioeconomic History   Marital status: Married    Spouse name: Not on file   Number of children: Not on file   Years of education: Not on file   Highest education level: Not on file  Occupational History   Not on file  Tobacco Use   Smoking status: Every Day    Current packs/day: 0.50    Average packs/day: 0.5 packs/day for 25.0 years (12.5 ttl pk-yrs)    Types: Cigarettes   Smokeless tobacco: Never  Vaping Use   Vaping status: Never Used  Substance and Sexual Activity   Alcohol use: Not Currently   Drug use: Not Currently   Sexual activity: Not  Currently    Birth control/protection: None  Other Topics Concern   Not on file  Social History Narrative   Not on file   Social Drivers of Health   Financial Resource Strain: Not on file  Food Insecurity: Not on file  Transportation Needs: Not on file  Physical Activity: Not on file  Stress: Not on file  Social Connections: Not on file  Intimate Partner Violence: Not on file    Review of Systems: All other review of systems negative except as mentioned in the HPI.  Physical Exam: Vital signs BP (!) 146/85  Pulse 76   Temp 98.1 F (36.7 C) (Skin)   Ht 5' 9 (1.753 m)   Wt 170 lb (77.1 kg)   SpO2 98%   BMI 25.10 kg/m   General:   Alert,  Well-developed, pleasant and cooperative in NAD Lungs:  Clear throughout to auscultation.   Heart:  Regular rate and rhythm Abdomen:  Soft, nontender and nondistended.   Neuro/Psych:  Alert and cooperative. Normal mood and affect. A and O x 3  Marcey Naval, MD [facility]

## 2023-10-15 NOTE — Progress Notes (Signed)
 Transferred to PACU via stretcher, arousing, VSS.

## 2023-10-15 NOTE — Patient Instructions (Signed)

## 2023-10-15 NOTE — Progress Notes (Signed)
Pt. states no medical or surgical changes since previsit or office visit. 

## 2023-10-15 NOTE — Progress Notes (Signed)
 Called to room to assist during endoscopic procedure.  Patient ID and intended procedure confirmed with present staff. Received instructions for my participation in the procedure from the performing physician.

## 2023-10-15 NOTE — Op Note (Signed)
 [facility] Patient Name: Abigail Berg Procedure Date: 10/15/2023 11:12 AM MRN: [id number] Endoscopist: Elspeth P. Leigh , MD, [id number] Age: 58 Referring MD:  Date of Birth: 09-26-1965 Gender: Female Account #: [id number] Procedure:                Colonoscopy Indications:              High risk colon cancer surveillance: Personal                            history of colonic polyps - numerous polyps removed                            in 2020, Chek2 gene mutation, multiple second                            degree relatives with colon cancer Medicines:                Monitored Anesthesia Care Procedure:                Pre-Anesthesia Assessment:                           - Prior to the procedure, a History and Physical                            was performed, and patient medications and                            allergies were reviewed. The patient's tolerance of                            previous anesthesia was also reviewed. The risks                            and benefits of the procedure and the sedation                            options and risks were discussed with the patient.                            All questions were answered, and informed consent                            was obtained. Prior Anticoagulants: The patient has                            taken no anticoagulant or antiplatelet agents. ASA                            Grade Assessment: II - A patient with mild systemic                            disease. After reviewing the risks and benefits,  the patient was deemed in satisfactory condition to                            undergo the procedure.                           After obtaining informed consent, the colonoscope                            was passed under direct vision. Throughout the                            procedure, the patient's blood pressure, pulse, and                            oxygen saturations were  monitored continuously. The                            [id number]-HQ190L Colonoscope [id number] was introduced                            through the anus and advanced to the the                            ileocolonic anastomosis. The colonoscopy was                            performed without difficulty. The patient tolerated                            the procedure well. The quality of the bowel                            preparation was good. The ileocecal valve,                            appendiceal orifice, and rectum were photographed. Scope In: 11:16:33 AM Scope Out: 11:38:13 AM Scope Withdrawal Time: 0 hours 12 minutes 36 seconds  Total Procedure Duration: 0 hours 21 minutes 40 seconds  Findings:                 The perianal and digital rectal examinations were                            normal.                           There was evidence of a prior end-to-side                            ileo-colonic anastomosis in the ascending colon.                            This was patent and was characterized by healthy  appearing mucosa.                           Scattered diverticula were found in the left colon                            and right colon.                           A 4 mm polyp was found in the splenic flexure. The                            polyp was flat. The polyp was removed with a cold                            snare. Resection and retrieval were complete.                           Two flat and sessile polyps were found in the                            sigmoid colon. The polyps were 2 to 4 mm in size.                            These polyps were removed with a cold snare.                            Resection and retrieval were complete.                           A 6 mm polyp was found in the recto-sigmoid colon.                            The polyp was sessile. The polyp was removed with a                            cold snare. Resection and  retrieval were complete.                           Internal hemorrhoids were found during retroflexion.                           The exam was otherwise without abnormality. Complications:            No immediate complications. Estimated blood loss:                            Minimal. Estimated Blood Loss:     Estimated blood loss was minimal. Impression:               - Patent end-to-side ileo-colonic anastomosis,                            characterized by healthy appearing mucosa.                           -  Diverticulosis in the left colon and in the right                            colon.                           - One 4 mm polyp at the splenic flexure, removed                            with a cold snare. Resected and retrieved.                           - Two 2 to 4 mm polyps in the sigmoid colon,                            removed with a cold snare. Resected and retrieved.                           - One 6 mm polyp at the recto-sigmoid colon,                            removed with a cold snare. Resected and retrieved.                           - Internal hemorrhoids.                           - The examination was otherwise normal. Recommendation:           - Patient has a contact number available for                            emergencies. The signs and symptoms of potential                            delayed complications were discussed with the                            patient. Return to normal activities tomorrow.                            Written discharge instructions were provided to the                            patient.                           - Resume previous diet.                           - Continue present medications.                           - Await pathology results. [name], MD 10/15/2023 11:44:24 AM This report has been signed electronically.

## 2023-10-16 ENCOUNTER — Telehealth: Payer: Self-pay

## 2023-10-16 NOTE — Telephone Encounter (Signed)
  Follow up Call-     10/15/2023   10:41 AM  Call back number  Post procedure Call Back phone  # [phone]  Permission to leave phone message Yes     Patient questions:  Do you have a fever, pain , or abdominal swelling? No. Pain Score  0 *  Have you tolerated food without any problems? Yes.    Have you been able to return to your normal activities? Yes.    Do you have any questions about your discharge instructions: Diet   No. Medications  No. Follow up visit  No.  Do you have questions or concerns about your Care? No.  Actions: * If pain score is 4 or above: No action needed, pain <4.

## 2023-10-20 ENCOUNTER — Ambulatory Visit: Payer: Self-pay | Admitting: Gastroenterology

## 2023-10-20 LAB — SURGICAL PATHOLOGY

## 2023-10-26 DIAGNOSIS — D2371 Other benign neoplasm of skin of right lower limb, including hip: Secondary | ICD-10-CM | POA: Diagnosis not present

## 2023-10-26 DIAGNOSIS — D2361 Other benign neoplasm of skin of right upper limb, including shoulder: Secondary | ICD-10-CM | POA: Diagnosis not present

## 2023-10-26 DIAGNOSIS — C44722 Squamous cell carcinoma of skin of right lower limb, including hip: Secondary | ICD-10-CM | POA: Diagnosis not present

## 2023-10-26 DIAGNOSIS — D485 Neoplasm of uncertain behavior of skin: Secondary | ICD-10-CM | POA: Diagnosis not present

## 2023-10-26 DIAGNOSIS — D2271 Melanocytic nevi of right lower limb, including hip: Secondary | ICD-10-CM | POA: Diagnosis not present

## 2023-10-26 DIAGNOSIS — D2362 Other benign neoplasm of skin of left upper limb, including shoulder: Secondary | ICD-10-CM | POA: Diagnosis not present

## 2023-10-29 ENCOUNTER — Other Ambulatory Visit: Payer: Self-pay | Admitting: Nurse Practitioner

## 2023-11-05 ENCOUNTER — Other Ambulatory Visit: Payer: Self-pay

## 2023-11-05 ENCOUNTER — Inpatient Hospital Stay: Attending: Hematology

## 2023-11-05 ENCOUNTER — Encounter: Payer: Self-pay | Admitting: Hematology

## 2023-11-05 ENCOUNTER — Inpatient Hospital Stay: Admitting: Hematology

## 2023-11-05 VITALS — BP 130/78 | HR 82 | Temp 98.2°F | Resp 16 | Ht 69.0 in | Wt 175.0 lb

## 2023-11-05 DIAGNOSIS — C50411 Malignant neoplasm of upper-outer quadrant of right female breast: Secondary | ICD-10-CM | POA: Insufficient documentation

## 2023-11-05 DIAGNOSIS — Z170 Estrogen receptor positive status [ER+]: Secondary | ICD-10-CM

## 2023-11-05 DIAGNOSIS — Z1731 Human epidermal growth factor receptor 2 positive status: Secondary | ICD-10-CM | POA: Insufficient documentation

## 2023-11-05 DIAGNOSIS — E039 Hypothyroidism, unspecified: Secondary | ICD-10-CM

## 2023-11-05 DIAGNOSIS — Z79810 Long term (current) use of selective estrogen receptor modulators (SERMs): Secondary | ICD-10-CM | POA: Diagnosis not present

## 2023-11-05 DIAGNOSIS — Z1501 Genetic susceptibility to malignant neoplasm of breast: Secondary | ICD-10-CM

## 2023-11-05 LAB — CBC WITH DIFFERENTIAL (CANCER CENTER ONLY)
Abs Immature Granulocytes: 0.01 K/uL (ref 0.00–0.07)
Basophils Absolute: 0 K/uL (ref 0.0–0.1)
Basophils Relative: 1 %
Eosinophils Absolute: 0.1 K/uL (ref 0.0–0.5)
Eosinophils Relative: 2 %
HCT: 39.9 % (ref 36.0–46.0)
Hemoglobin: 13.8 g/dL (ref 12.0–15.0)
Immature Granulocytes: 0 %
Lymphocytes Relative: 41 %
Lymphs Abs: 2.8 K/uL (ref 0.7–4.0)
MCH: 30.7 pg (ref 26.0–34.0)
MCHC: 34.6 g/dL (ref 30.0–36.0)
MCV: 88.9 fL (ref 80.0–100.0)
Monocytes Absolute: 0.3 K/uL (ref 0.1–1.0)
Monocytes Relative: 5 %
Neutro Abs: 3.5 K/uL (ref 1.7–7.7)
Neutrophils Relative %: 51 %
Platelet Count: 227 K/uL (ref 150–400)
RBC: 4.49 MIL/uL (ref 3.87–5.11)
RDW: 13.3 % (ref 11.5–15.5)
WBC Count: 6.8 K/uL (ref 4.0–10.5)
nRBC: 0 % (ref 0.0–0.2)

## 2023-11-05 LAB — CMP (CANCER CENTER ONLY)
ALT: 8 U/L (ref 0–44)
AST: 14 U/L — ABNORMAL LOW (ref 15–41)
Albumin: 3.9 g/dL (ref 3.5–5.0)
Alkaline Phosphatase: 47 U/L (ref 38–126)
Anion gap: 4 — ABNORMAL LOW (ref 5–15)
BUN: 16 mg/dL (ref 6–20)
CO2: 31 mmol/L (ref 22–32)
Calcium: 9.3 mg/dL (ref 8.9–10.3)
Chloride: 104 mmol/L (ref 98–111)
Creatinine: 0.93 mg/dL (ref 0.44–1.00)
GFR, Estimated: >60 mL/min (ref >60)
Glucose, Bld: 133 mg/dL — ABNORMAL HIGH (ref 70–99)
Potassium: 3.6 mmol/L (ref 3.5–5.1)
Sodium: 139 mmol/L (ref 135–145)
Total Bilirubin: 0.3 mg/dL (ref 0.0–1.2)
Total Protein: 7 g/dL (ref 6.5–8.1)

## 2023-11-05 NOTE — Telephone Encounter (Signed)
 POst procedure follow up call, no answer,

## 2023-11-05 NOTE — Progress Notes (Signed)
 [facility]   [phone] Fax:([phone]   Clinic Follow up Note   Patient Care Team: Lanny Callander, MD as PCP - General (Hematology) Lavona Agent, MD as PCP - Cardiology (Cardiology) Armbruster, Elspeth SQUIBB, MD as Consulting Physician (Gastroenterology) Allyn Dena DASEN as Counselor (Licensed Clinical Social Worker) Lanny Callander, MD as Consulting Physician (Hematology) Burton, Lacie K, NP as Nurse Practitioner (Nurse Practitioner) Tyree Nanetta SAILOR, RN as Oncology Nurse Navigator Ebbie Cough, MD as Consulting Physician (General Surgery)  Date of Service:  11/05/2023  CHIEF COMPLAINT: f/u of right breast cancer  CURRENT THERAPY:  Tamoxifen  20 mg daily  Oncology History   Malignant neoplasm of upper-outer quadrant of right breast in female, estrogen receptor positive (HCC) Stage 1A, p(T1cN0M0), stage IA, ER+/PR-/HER2+, Grade 3.  -diagnosed in 05/2019. S/p right breast lumpectomy, weekly Abraxane  and trastuzumab  07/29/19 - 10/12/19, adjuvant radiation, and maintenance trastuzumab  (Kanjinti ) q3weeks 11/03/19 - 07/12/20. -She started adjuvant tamoxifen  in 11/2019, tolerating well overall with mild hot flashes. Her last period was 02/2019, prior to chemo. Last [facility] 11/22/20 showed she is still perimenopausal.  -most recent screening MRI on 05/16/21 and mammogram on 11/22/20 were benign. -Due to abnormal screening mammogram in November 2023, she underwent a biopsy of left breast which showed complex sclerosing lesion, she underwent surgical biopsy on February 06, 2022 and surgical path was benign without malignancies or high risk features. -Reviewed CT scan from September 04, 2022 showed stable 5 mm lung nodule, postradiation change, no evidence of cancer recurrence. -She is clinically doing well, will continue tamoxifen  and cancer surveillance.  Assessment & Plan Hormone receptor-positive right breast cancer Diagnosed in April 2021, status post surgery and chemotherapy,  currently on tamoxifen  therapy since October 2021. Asymptomatic with no new breast concerns. - Continue tamoxifen  therapy - Order [facility] test to assess menopausal status - Review hormonal level results to determine postmenopausal status - Consider switch to aromatase inhibitor if postmenopausal - Ensure mammogram is completed in November  Possible herpes zoster (shingles) in right lower extremity One-week history of rash, now drying up. No pain, only initial itching. Likely triggered by stress or immune suppression. 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 Recent rash at the site likely due to an allergic reaction to sterilization agents, not related to shingles. - Follow up with dermatologist for further excision  Colonic polyps Recent colonoscopy revealed four precancerous polyps, all removed. No current symptoms or concerns.  Plan - She is clinically doing well, no concern for cancer recurrence.  Will continue tamoxifen  - Repeat FSH to see if she is postmenopausal, if yes, we will switch her to anastrozole     SUMMARY OF ONCOLOGIC HISTORY: Oncology History Overview Note  Cancer Staging Malignant neoplasm of upper-outer quadrant of right breast in female, estrogen receptor positive (HCC) Staging form: Breast, AJCC 8th Edition - Clinical stage from 06/01/2019: Stage IA (cT1c, cN0, cM0, G3, ER+, PR-, HER2+) - Signed by Lanny Callander, MD on 06/17/2019 - Pathologic stage from 06/30/2019: Stage IA (pT1c, pN0, cM0, G3, ER+, PR-, HER2+) - Signed by Lanny Callander, MD on 07/15/2019    Malignant neoplasm of upper-outer quadrant of right breast in female, estrogen receptor positive (HCC)  10/2018 - 06/17/2019 Anti-estrogen oral therapy   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 Held  after 06/17/19 to proceed with  her breast cancer surgery and chemo.    05/06/2019 Breast MRI   IMPRESSION Enhancing right breast mass suspicious for malignancy. Second look US  if recommended.  No Suspicious enhancing left breast masses.    05/24/2019 Breast US    IMPRESSION 1.8cm irregular right breast mass within the 9:30 position 3.5cm from the nipple is highly suggestive of malignancy. An Ultrasound guided breast biopsy is recommended. The findings of the study and recommendation for biopsy were discussed with the patient directly.    06/01/2019 Cancer Staging   Staging form: Breast, AJCC 8th Edition - Clinical stage from 06/01/2019: Stage IA (cT1c, cN0, cM0, G3, ER+, PR-, HER2+) - Signed by Lanny Callander, MD on 06/17/2019   06/01/2019 Initial Biopsy   Breast Biopsy - Right Breast Core Bx DIAGNOSIS INFILTRATING DUCT CARCINOMA WITH MINOR DUCTAL CARCINOMA IN SITU COMPNENT OF RIGHT BREAST, CORE NEEDLE BIOPSY    06/01/2019 Receptors her2   ER: greater than 75% of tumor cells show moderate staining  PR: Negative  HER2: Positive 3+ Ki67: 40%   06/17/2019 Initial Diagnosis   Malignant neoplasm of upper-outer quadrant of right breast in female, estrogen receptor positive (HCC)   06/30/2019 Surgery   RIGHT BREAST LUMPECTOMY WITH RADIOACTIVE SEED AND RIGHT AXILLARY SENTINEL NODE BIOPSY and PAC placement  by Dr Ebbie     06/30/2019 Pathology Results   FINAL MICROSCOPIC DIAGNOSIS:   A. BREAST, RIGHT, LUMPECTOMY:  - Invasive ductal carcinoma, grade 3, spanning 1.5 cm.  - Intermediate grade ductal carcinoma in situ.  - Invasive carcinoma is 0.1-0.2 from the final lateral margin (part E).  - Margins are negative for in situ carcinoma.  - Biopsy site.  - See oncology table.   B. LYMPH NODE, RIGHT AXILLARY, SENTINEL, BIOPSY:  - One of one lymph nodes negative for carcinoma (0/1).   C. LYMPH NODE, RIGHT AXILLARY, SENTINEL, BIOPSY:  - One of one lymph nodes negative for carcinoma (0/1).   D.  LYMPH NODE, RIGHT AXILLARY, SENTINEL, BIOPSY:  - One of one lymph nodes negative for carcinoma (0/1).   E. BREAST, RIGHT ADDITIONAL LATERAL MARGIN, EXCISION:  - Invasive ductal carcinoma.  - Invasive carcinoma is 0.1-0.2 cm from the new lateral margin.   F. BREAST, RIGHT ADDITIONAL POSTERIOR MARGIN, EXCISION:  - Fibrocystic change and usual ductal hyperplasia.  - No malignancy identified.   G. BREAST, RIGHT ADDITIONAL SUPERIOR MARGIN, EXCISION:  - Fibrocystic change and usual ductal hyperplasia.  - Incidental radial scar.  - No malignancy identified.     PROGNOSTIC INDICATOR RESULTS:  The tumor cells are POSITIVE for Her2 (3+).  Estrogen Receptor:       POSITIVE, 85%, MODERATE STAINING  Progesterone Receptor:   NEGATIVE  Proliferation Marker Ki-67:   25%    06/30/2019 Cancer Staging   Staging form: Breast, AJCC 8th Edition - Pathologic stage from 06/30/2019: Stage IA (pT1c, pN0, cM0, G3, ER+, PR-, HER2+) - Signed by Lanny Callander, MD on 07/15/2019   07/29/2019 - 07/12/2020 Chemotherapy   Adjuvant Weekly Abraxane  (Due to her insurance denial of Abraxane , I changed it to paclitaxel  starting 09/15/19) and Transtuzumab for 12 weeks starting 07/29/19-10/12/19, followed by maintenance trastuzumab  q3weeks starting 11/03/19 to complete 1 year of treatment. Due to headaches her trastuzumab  (Ogivr) was switched to trastuzumab  (Kanjinti ) on 09/08/19.     11/24/2019 -  Anti-estrogen oral therapy   Tamoxifen    12/2019 - 01/2020 Radiation Therapy   Outside Radiation in late 2021.       Discussed the use of AI  scribe software for clinical note transcription with the patient, who gave verbal consent to proceed.  History of Present Illness Abigail Berg is a 58 year old female with breast cancer who presents for a follow-up visit.  Diagnosed with breast cancer in April 2021, she has undergone surgery, chemotherapy, and antibody treatment. She has been on tamoxifen  since October 2021 without issues. No  recent breast pain or changes. A knot is present on the right side from previous surgery.  Three weeks ago, a colonoscopy revealed four precancerous polyps, which were removed. This is an improvement compared to previous colonoscopies.  Recently, a biopsy for squamous cell carcinoma was performed by a dermatologist. A rash in the area is itchy but not painful, and she uses hydrocortisone cream for inflammation.  A recent rash resembling shingles has been present for about a week. Initially blistered and itchy, the rash is now drying up. No pain is associated with this rash.  She is currently taking tamoxifen  and Lexapro , with a recent refill of Lexapro . She remains active but notes a decrease in activity level compared to before her cancer diagnosis.     All other systems were reviewed with the patient and are negative.  MEDICAL HISTORY:  Past Medical History:  Diagnosis Date   Benign neoplasm of breast 01/08/2023   Benign neoplasm of colon 01/08/2023   Breast lump 01/08/2023   Cancer (HCC) 06/30/2019   right breast IDC w/ rad w/chemo   Cough 01/08/2023   Depression    Eye strain 01/08/2023   Family history of colon cancer    MAT PARENTS ) MOM AND DAD), MAT COUSIN- DIED AGE 54 FROM COLON CANCER   Fever 03/07/2020   History of chemotherapy    COMPLETED 10-2019- COMPLETED HERCEPTIN  06-2020   History of kidney stones    History of radiation therapy    COMPLETED 12-2019   Monoallelic mutation of CHEK2 gene in female patient    Nasal congestion 03/07/2020   Neuromuscular disorder (HCC)    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   Nuclear sclerosis 01/08/2023   Personal history of colonic polyps    Pharyngitis 01/08/2023   Pneumonia    Posterior vitreous detachment 01/08/2023   S/P partial colectomy 2017   Sneezing 03/07/2020   Sore throat 03/07/2020   Tobacco dependence syndrome 01/08/2023    SURGICAL HISTORY: Past Surgical History:  Procedure Laterality Date   BREAST BIOPSY Left  12/19/2021   MM LT BREAST BX W LOC DEV 1ST LESION IMAGE BX SPEC STEREO GUIDE 12/19/2021 [facility]   BREAST BIOPSY  02/05/2022   MM LT RADIOACTIVE SEED LOC MAMMO GUIDE 02/05/2022 [facility]   BREAST LUMPECTOMY Right 06/30/2019   right breast lumpectomy IDC w/ rad w/chemo   BREAST LUMPECTOMY WITH RADIOACTIVE SEED AND SENTINEL LYMPH NODE BIOPSY Right 06/30/2019   Procedure: RIGHT BREAST LUMPECTOMY WITH RADIOACTIVE SEED AND RIGHT AXILLARY SENTINEL NODE BIOPSY;  Surgeon: Ebbie Cough, MD;  Location: [facility];  Service: General;  Laterality: Right;  PEC BLOCK   BREAST SURGERY     COLON SURGERY  2017   Partial colectomy   COLONOSCOPY  10/11/2020   SA ADEQUATE W SUTAB    PARATHYROIDECTOMY Left 01/25/2020   Procedure: LEFT INFERIOR PARATHYROIDECTOMY;  Surgeon: Eletha Boas, MD;  Location: [facility];  Service: General;  Laterality: Left;   POLYPECTOMY     PORT-A-CATH REMOVAL Left 10/24/2020   Procedure: REMOVAL PORT-A-CATH;  Surgeon: Ebbie Cough, MD;  Location:  SURGERY CENTER;  Service:  General;  Laterality: Left;   PORTACATH PLACEMENT Left 06/30/2019   Procedure: INSERTION PORT-A-CATH WITH ULTRASOUND GUIDANCE;  Surgeon: Ebbie Cough, MD;  Location: [facility];  Service: General;  Laterality: Left;   RADIOACTIVE SEED GUIDED EXCISIONAL BREAST BIOPSY Left 02/06/2022   Procedure: RADIOACTIVE SEED GUIDED EXCISIONAL LEFT BREAST BIOPSY;  Surgeon: Ebbie Cough, MD;  Location: [facility];  Service: General;  Laterality: Left;   VENTRAL HERNIA REPAIR N/A 01/25/2020   Procedure: HERNIA REPAIR VENTRAL ADULT WITH MESH;  Surgeon: Eletha Boas, MD;  Location: [facility];  Service: General;  Laterality: N/A;    I have reviewed the social history and family history with the patient and they are unchanged from previous note.  ALLERGIES:  has no known allergies.  MEDICATIONS:  Current Outpatient Medications  Medication Sig  Dispense Refill   CALCIUM PO Take by mouth.     Cholecalciferol (VITAMIN D) 50 MCG (2000 UT) tablet Take 2,000 Units by mouth daily.     escitalopram  (LEXAPRO ) 20 MG tablet TAKE ONE TABLET BY MOUTH DAILY 90 tablet 1   ibuprofen (ADVIL) 200 MG tablet Take 600 mg by mouth every 6 (six) hours as needed for headache or mild pain. (Patient not taking: Reported on 10/15/2023)     tamoxifen  (NOLVADEX ) 20 MG tablet TAKE 1 TABLET BY MOUTH DAILY 90 tablet 1   No current facility-administered medications for this visit.    PHYSICAL EXAMINATION: ECOG PERFORMANCE STATUS: 0 - Asymptomatic  Vitals:   11/05/23 1242  BP: 130/78  Pulse: 82  Resp: 16  Temp: 98.2 F (36.8 C)  SpO2: 99%   Wt Readings from Last 3 Encounters:  11/05/23 175 lb (79.4 kg)  10/15/23 170 lb (77.1 kg)  10/01/23 170 lb (77.1 kg)     GENERAL:alert, no distress and comfortable SKIN: skin color, texture, turgor are normal, (+) healing skin rash in the right lower extremity EYES: normal, Conjunctiva are pink and non-injected, sclera clear NECK: supple, thyroid normal size, non-tender, without nodularity LYMPH:  no palpable lymphadenopathy in the cervical, axillary  LUNGS: clear to auscultation and percussion with normal breathing effort HEART: regular rate & rhythm and no murmurs and no lower extremity edema ABDOMEN:abdomen soft, non-tender and normal bowel sounds Musculoskeletal:no cyanosis of digits and no clubbing  NEURO: alert & oriented x 3 with fluent speech, no focal motor/sensory deficits Breasts: Breast inspection showed them to be symmetrical with no nipple discharge. Palpation of the breasts and axilla revealed no obvious mass that I could appreciate.  Physical Exam   LABORATORY DATA:  I have reviewed the data as listed    Latest Ref Rng & Units 11/05/2023   11:55 AM 04/30/2023   12:58 PM 09/04/2022    9:13 AM  CBC  WBC 4.0 - 10.5 K/uL 6.8  6.4  5.3   Hemoglobin 12.0 - 15.0 g/dL 86.1  84.9  85.3   Hematocrit  36.0 - 46.0 % 39.9  43.3  42.1   Platelets 150 - 400 K/uL 227  230  212         Latest Ref Rng & Units 11/05/2023   11:55 AM 04/30/2023   12:58 PM 09/04/2022    9:13 AM  CMP  Glucose 70 - 99 mg/dL 866  881  885   BUN 6 - 20 mg/dL 16  10  14    Creatinine 0.44 - 1.00 mg/dL 9.06  9.12  9.12   Sodium 135 - 145 mmol/L 139  139  141  Potassium 3.5 - 5.1 mmol/L 3.6  4.3  3.6   Chloride 98 - 111 mmol/L 104  106  108   CO2 22 - 32 mmol/L 31  29  24    Calcium 8.9 - 10.3 mg/dL 9.3  9.7  8.6   Total Protein 6.5 - 8.1 g/dL 7.0  7.3  6.8   Total Bilirubin 0.0 - 1.2 mg/dL 0.3  0.3  0.4   Alkaline Phos 38 - 126 U/L 47  45  36   AST 15 - 41 U/L 14  16  21    ALT 0 - 44 U/L 8  10  17        RADIOGRAPHIC STUDIES: I have personally reviewed the radiological images as listed and agreed with the findings in the report. No results found.    No orders of the defined types were placed in this encounter.  All questions were answered. The patient knows to call the clinic with any problems, questions or concerns. No barriers to learning was detected. The total time spent in the appointment was 25 minutes, including review of chart and various tests results, discussions about plan of care and coordination of care plan     Onita Mattock, MD 11/05/2023

## 2023-11-05 NOTE — Assessment & Plan Note (Signed)
Stage 1A, p(T1cN0M0), stage IA, ER+/PR-/HER2+, Grade 3.  -diagnosed in 05/2019. S/p right breast lumpectomy, weekly Abraxane and trastuzumab 07/29/19 - 10/12/19, adjuvant radiation, and maintenance trastuzumab [facility]) q3weeks 11/03/19 - 07/12/20. -She started adjuvant tamoxifen in 11/2019, tolerating well overall with mild hot flashes. Her last period was 02/2019, prior to chemo. Last [facility] 11/22/20 showed she is still perimenopausal.  -most recent screening MRI on 05/16/21 and mammogram on 11/22/20 were benign. -Due to abnormal screening mammogram in November 2023, she underwent a biopsy of left breast which showed complex sclerosing lesion, she underwent surgical biopsy on February 06, 2022 and surgical path was benign without malignancies or high risk features. -Reviewed CT scan from September 04, 2022 showed stable 5 mm lung nodule, postradiation change, no evidence of cancer recurrence. -She is clinically doing well, will continue tamoxifen and cancer surveillance.

## 2023-11-26 DIAGNOSIS — C44722 Squamous cell carcinoma of skin of right lower limb, including hip: Secondary | ICD-10-CM | POA: Diagnosis not present

## 2023-12-14 ENCOUNTER — Other Ambulatory Visit: Payer: Self-pay | Admitting: Hematology

## 2023-12-14 DIAGNOSIS — Z1231 Encounter for screening mammogram for malignant neoplasm of breast: Secondary | ICD-10-CM

## 2023-12-17 ENCOUNTER — Inpatient Hospital Stay: Admission: RE | Admit: 2023-12-17 | Discharge: 2023-12-17 | Payer: BC Managed Care – PPO | Attending: Hematology

## 2023-12-17 DIAGNOSIS — Z1231 Encounter for screening mammogram for malignant neoplasm of breast: Secondary | ICD-10-CM

## 2024-01-07 DIAGNOSIS — D235 Other benign neoplasm of skin of trunk: Secondary | ICD-10-CM | POA: Diagnosis not present

## 2024-01-07 DIAGNOSIS — D2371 Other benign neoplasm of skin of right lower limb, including hip: Secondary | ICD-10-CM | POA: Diagnosis not present

## 2024-01-07 DIAGNOSIS — D2339 Other benign neoplasm of skin of other parts of face: Secondary | ICD-10-CM | POA: Diagnosis not present

## 2024-01-07 DIAGNOSIS — D234 Other benign neoplasm of skin of scalp and neck: Secondary | ICD-10-CM | POA: Diagnosis not present

## 2024-05-05 ENCOUNTER — Ambulatory Visit: Admitting: Hematology

## 2024-05-05 ENCOUNTER — Other Ambulatory Visit
